# Patient Record
Sex: Female | Born: 1935 | Race: White | Hispanic: No | Smoking: Never smoker
Health system: Southern US, Community
[De-identification: ages and names within clinical notes are randomized; demographics above are authoritative.]

## PROBLEM LIST (undated history)

## (undated) DIAGNOSIS — I350 Nonrheumatic aortic (valve) stenosis: Secondary | ICD-10-CM

## (undated) DIAGNOSIS — I5032 Chronic diastolic (congestive) heart failure: Secondary | ICD-10-CM

## (undated) DIAGNOSIS — M1990 Unspecified osteoarthritis, unspecified site: Secondary | ICD-10-CM

## (undated) DIAGNOSIS — N183 Chronic kidney disease, stage 3 unspecified: Secondary | ICD-10-CM

## (undated) DIAGNOSIS — E1149 Type 2 diabetes mellitus with other diabetic neurological complication: Secondary | ICD-10-CM

## (undated) DIAGNOSIS — K649 Unspecified hemorrhoids: Secondary | ICD-10-CM

## (undated) DIAGNOSIS — E7800 Pure hypercholesterolemia, unspecified: Secondary | ICD-10-CM

## (undated) DIAGNOSIS — G4730 Sleep apnea, unspecified: Secondary | ICD-10-CM

## (undated) DIAGNOSIS — H8309 Labyrinthitis, unspecified ear: Secondary | ICD-10-CM

## (undated) DIAGNOSIS — K8020 Calculus of gallbladder without cholecystitis without obstruction: Secondary | ICD-10-CM

## (undated) DIAGNOSIS — G5603 Carpal tunnel syndrome, bilateral upper limbs: Secondary | ICD-10-CM

## (undated) DIAGNOSIS — I639 Cerebral infarction, unspecified: Secondary | ICD-10-CM

## (undated) DIAGNOSIS — I482 Chronic atrial fibrillation, unspecified: Secondary | ICD-10-CM

## (undated) DIAGNOSIS — I209 Angina pectoris, unspecified: Secondary | ICD-10-CM

## (undated) DIAGNOSIS — J45909 Unspecified asthma, uncomplicated: Secondary | ICD-10-CM

## (undated) DIAGNOSIS — K5792 Diverticulitis of intestine, part unspecified, without perforation or abscess without bleeding: Secondary | ICD-10-CM

## (undated) DIAGNOSIS — I340 Nonrheumatic mitral (valve) insufficiency: Secondary | ICD-10-CM

## (undated) DIAGNOSIS — K559 Vascular disorder of intestine, unspecified: Secondary | ICD-10-CM

## (undated) DIAGNOSIS — Z860101 Personal history of adenomatous and serrated colon polyps: Secondary | ICD-10-CM

## (undated) DIAGNOSIS — Z8739 Personal history of other diseases of the musculoskeletal system and connective tissue: Secondary | ICD-10-CM

## (undated) DIAGNOSIS — I517 Cardiomegaly: Secondary | ICD-10-CM

## (undated) DIAGNOSIS — I10 Essential (primary) hypertension: Secondary | ICD-10-CM

## (undated) DIAGNOSIS — D649 Anemia, unspecified: Secondary | ICD-10-CM

## (undated) DIAGNOSIS — E039 Hypothyroidism, unspecified: Secondary | ICD-10-CM

## (undated) DIAGNOSIS — I739 Peripheral vascular disease, unspecified: Secondary | ICD-10-CM

## (undated) DIAGNOSIS — R51 Headache: Secondary | ICD-10-CM

## (undated) DIAGNOSIS — R600 Localized edema: Secondary | ICD-10-CM

## (undated) DIAGNOSIS — G629 Polyneuropathy, unspecified: Secondary | ICD-10-CM

## (undated) DIAGNOSIS — Z86010 Personal history of colonic polyps: Secondary | ICD-10-CM

## (undated) DIAGNOSIS — N6019 Diffuse cystic mastopathy of unspecified breast: Secondary | ICD-10-CM

## (undated) DIAGNOSIS — K219 Gastro-esophageal reflux disease without esophagitis: Secondary | ICD-10-CM

## (undated) DIAGNOSIS — J189 Pneumonia, unspecified organism: Secondary | ICD-10-CM

## (undated) HISTORY — PX: DILATION AND CURETTAGE OF UTERUS: SHX78

## (undated) HISTORY — DX: Peripheral vascular disease, unspecified: I73.9

## (undated) HISTORY — PX: CATARACT EXTRACTION W/ INTRAOCULAR LENS  IMPLANT, BILATERAL: SHX1307

## (undated) HISTORY — DX: Personal history of colonic polyps: Z86.010

## (undated) HISTORY — DX: Chronic diastolic (congestive) heart failure: I50.32

## (undated) HISTORY — DX: Localized edema: R60.0

## (undated) HISTORY — DX: Nonrheumatic aortic (valve) stenosis: I35.0

## (undated) HISTORY — DX: Labyrinthitis, unspecified ear: H83.09

## (undated) HISTORY — PX: KYPHOPLASTY: SHX5884

## (undated) HISTORY — DX: Diffuse cystic mastopathy of unspecified breast: N60.19

## (undated) HISTORY — DX: Diverticulitis of intestine, part unspecified, without perforation or abscess without bleeding: K57.92

## (undated) HISTORY — DX: Vascular disorder of intestine, unspecified: K55.9

## (undated) HISTORY — DX: Chronic kidney disease, stage 3 unspecified: N18.30

## (undated) HISTORY — DX: Unspecified hemorrhoids: K64.9

## (undated) HISTORY — DX: Personal history of adenomatous and serrated colon polyps: Z86.0101

## (undated) HISTORY — DX: Nonrheumatic mitral (valve) insufficiency: I34.0

## (undated) HISTORY — PX: TOTAL KNEE ARTHROPLASTY: SHX125

## (undated) HISTORY — DX: Calculus of gallbladder without cholecystitis without obstruction: K80.20

## (undated) HISTORY — DX: Chronic atrial fibrillation, unspecified: I48.20

## (undated) HISTORY — DX: Chronic kidney disease, stage 3 (moderate): N18.3

## (undated) HISTORY — DX: Type 2 diabetes mellitus with other diabetic neurological complication: E11.49

## (undated) HISTORY — DX: Unspecified osteoarthritis, unspecified site: M19.90

## (undated) HISTORY — DX: Anemia, unspecified: D64.9

## (undated) HISTORY — DX: Gastro-esophageal reflux disease without esophagitis: K21.9

## (undated) HISTORY — DX: Carpal tunnel syndrome, bilateral upper limbs: G56.03

---

## 1959-07-01 HISTORY — PX: TONSILLECTOMY AND ADENOIDECTOMY: SUR1326

## 1969-06-30 HISTORY — PX: VAGINAL HYSTERECTOMY: SUR661

## 2000-02-23 ENCOUNTER — Encounter: Payer: Self-pay | Admitting: Gastroenterology

## 2000-02-23 ENCOUNTER — Encounter: Admission: RE | Admit: 2000-02-23 | Discharge: 2000-02-23 | Payer: Self-pay | Admitting: Gastroenterology

## 2001-02-25 ENCOUNTER — Encounter: Admission: RE | Admit: 2001-02-25 | Discharge: 2001-02-25 | Payer: Self-pay | Admitting: Gastroenterology

## 2001-02-25 ENCOUNTER — Encounter: Payer: Self-pay | Admitting: Gastroenterology

## 2001-04-16 ENCOUNTER — Ambulatory Visit: Admission: RE | Admit: 2001-04-16 | Discharge: 2001-04-16 | Payer: Self-pay | Admitting: Orthopedic Surgery

## 2001-05-09 ENCOUNTER — Encounter: Admission: RE | Admit: 2001-05-09 | Discharge: 2001-08-07 | Payer: Self-pay | Admitting: Gastroenterology

## 2001-06-10 ENCOUNTER — Encounter: Payer: Self-pay | Admitting: Orthopedic Surgery

## 2001-06-12 ENCOUNTER — Inpatient Hospital Stay (HOSPITAL_COMMUNITY): Admission: RE | Admit: 2001-06-12 | Discharge: 2001-06-17 | Payer: Self-pay | Admitting: Orthopedic Surgery

## 2001-06-17 ENCOUNTER — Inpatient Hospital Stay (HOSPITAL_COMMUNITY)
Admission: RE | Admit: 2001-06-17 | Discharge: 2001-07-01 | Payer: Self-pay | Admitting: Physical Medicine & Rehabilitation

## 2001-06-23 ENCOUNTER — Encounter: Payer: Self-pay | Admitting: Physical Medicine & Rehabilitation

## 2001-06-24 ENCOUNTER — Encounter: Payer: Self-pay | Admitting: Physical Medicine & Rehabilitation

## 2001-10-15 ENCOUNTER — Ambulatory Visit (HOSPITAL_BASED_OUTPATIENT_CLINIC_OR_DEPARTMENT_OTHER): Admission: RE | Admit: 2001-10-15 | Discharge: 2001-10-15 | Payer: Self-pay | Admitting: General Surgery

## 2001-11-05 ENCOUNTER — Encounter: Admission: RE | Admit: 2001-11-05 | Discharge: 2001-11-05 | Payer: Self-pay | Admitting: Gastroenterology

## 2001-11-05 ENCOUNTER — Encounter: Payer: Self-pay | Admitting: Gastroenterology

## 2002-01-21 ENCOUNTER — Encounter: Admission: RE | Admit: 2002-01-21 | Discharge: 2002-04-21 | Payer: Self-pay | Admitting: Gastroenterology

## 2002-03-21 ENCOUNTER — Encounter: Payer: Self-pay | Admitting: Gastroenterology

## 2002-03-21 ENCOUNTER — Encounter: Admission: RE | Admit: 2002-03-21 | Discharge: 2002-03-21 | Payer: Self-pay | Admitting: Gastroenterology

## 2002-07-15 ENCOUNTER — Encounter: Admission: RE | Admit: 2002-07-15 | Discharge: 2002-10-13 | Payer: Self-pay | Admitting: Gastroenterology

## 2003-02-24 ENCOUNTER — Encounter: Admission: RE | Admit: 2003-02-24 | Discharge: 2003-02-24 | Payer: Self-pay | Admitting: Gastroenterology

## 2003-02-24 ENCOUNTER — Encounter: Payer: Self-pay | Admitting: Gastroenterology

## 2003-03-25 ENCOUNTER — Encounter: Admission: RE | Admit: 2003-03-25 | Discharge: 2003-03-25 | Payer: Self-pay | Admitting: Gastroenterology

## 2003-03-25 ENCOUNTER — Encounter: Payer: Self-pay | Admitting: Gastroenterology

## 2004-02-25 ENCOUNTER — Encounter: Admission: RE | Admit: 2004-02-25 | Discharge: 2004-02-25 | Payer: Self-pay | Admitting: Gastroenterology

## 2005-02-27 ENCOUNTER — Encounter: Admission: RE | Admit: 2005-02-27 | Discharge: 2005-02-27 | Payer: Self-pay | Admitting: Gastroenterology

## 2005-05-26 ENCOUNTER — Inpatient Hospital Stay (HOSPITAL_COMMUNITY): Admission: EM | Admit: 2005-05-26 | Discharge: 2005-05-27 | Payer: Self-pay | Admitting: Emergency Medicine

## 2005-06-17 ENCOUNTER — Encounter: Admission: RE | Admit: 2005-06-17 | Discharge: 2005-06-17 | Payer: Self-pay | Admitting: Gastroenterology

## 2005-06-28 ENCOUNTER — Ambulatory Visit (HOSPITAL_COMMUNITY): Admission: RE | Admit: 2005-06-28 | Discharge: 2005-06-28 | Payer: Self-pay | Admitting: Cardiology

## 2005-06-28 ENCOUNTER — Encounter (INDEPENDENT_AMBULATORY_CARE_PROVIDER_SITE_OTHER): Payer: Self-pay | Admitting: Cardiology

## 2005-10-06 ENCOUNTER — Encounter: Admission: RE | Admit: 2005-10-06 | Discharge: 2005-10-06 | Payer: Self-pay | Admitting: Neurology

## 2005-10-13 ENCOUNTER — Ambulatory Visit: Payer: Self-pay

## 2006-03-07 ENCOUNTER — Ambulatory Visit (HOSPITAL_COMMUNITY): Admission: RE | Admit: 2006-03-07 | Discharge: 2006-03-07 | Payer: Self-pay | Admitting: Neurology

## 2006-03-07 ENCOUNTER — Encounter (INDEPENDENT_AMBULATORY_CARE_PROVIDER_SITE_OTHER): Payer: Self-pay | Admitting: Neurology

## 2006-03-23 ENCOUNTER — Encounter: Admission: RE | Admit: 2006-03-23 | Discharge: 2006-03-23 | Payer: Self-pay | Admitting: Gastroenterology

## 2007-03-29 ENCOUNTER — Encounter: Admission: RE | Admit: 2007-03-29 | Discharge: 2007-03-29 | Payer: Self-pay | Admitting: Gastroenterology

## 2007-07-10 ENCOUNTER — Ambulatory Visit (HOSPITAL_COMMUNITY): Admission: RE | Admit: 2007-07-10 | Discharge: 2007-07-10 | Payer: Self-pay | Admitting: Cardiology

## 2007-07-10 ENCOUNTER — Encounter (INDEPENDENT_AMBULATORY_CARE_PROVIDER_SITE_OTHER): Payer: Self-pay | Admitting: Cardiology

## 2007-07-10 ENCOUNTER — Ambulatory Visit: Payer: Self-pay | Admitting: Vascular Surgery

## 2008-01-15 ENCOUNTER — Encounter: Admission: RE | Admit: 2008-01-15 | Discharge: 2008-01-15 | Payer: Self-pay | Admitting: Gastroenterology

## 2008-01-22 ENCOUNTER — Encounter: Admission: RE | Admit: 2008-01-22 | Discharge: 2008-01-22 | Payer: Self-pay | Admitting: Gastroenterology

## 2008-03-30 ENCOUNTER — Encounter: Admission: RE | Admit: 2008-03-30 | Discharge: 2008-03-30 | Payer: Self-pay | Admitting: Gastroenterology

## 2008-09-23 ENCOUNTER — Ambulatory Visit: Payer: Self-pay | Admitting: Vascular Surgery

## 2008-10-06 ENCOUNTER — Ambulatory Visit: Payer: Self-pay | Admitting: Vascular Surgery

## 2009-04-02 ENCOUNTER — Encounter: Admission: RE | Admit: 2009-04-02 | Discharge: 2009-04-02 | Payer: Self-pay | Admitting: Gastroenterology

## 2009-10-01 ENCOUNTER — Ambulatory Visit (HOSPITAL_COMMUNITY): Admission: RE | Admit: 2009-10-01 | Discharge: 2009-10-01 | Payer: Self-pay | Admitting: Gastroenterology

## 2010-04-15 ENCOUNTER — Encounter: Admission: RE | Admit: 2010-04-15 | Discharge: 2010-04-15 | Payer: Self-pay | Admitting: Geriatric Medicine

## 2010-09-04 ENCOUNTER — Inpatient Hospital Stay (HOSPITAL_COMMUNITY)
Admission: EM | Admit: 2010-09-04 | Discharge: 2010-09-08 | Payer: Self-pay | Source: Home / Self Care | Admitting: Emergency Medicine

## 2010-09-05 ENCOUNTER — Encounter (INDEPENDENT_AMBULATORY_CARE_PROVIDER_SITE_OTHER): Payer: Self-pay | Admitting: Internal Medicine

## 2010-09-06 ENCOUNTER — Ambulatory Visit: Payer: Self-pay | Admitting: Physical Medicine & Rehabilitation

## 2010-10-27 ENCOUNTER — Ambulatory Visit (HOSPITAL_COMMUNITY)
Admission: RE | Admit: 2010-10-27 | Discharge: 2010-10-27 | Payer: Self-pay | Source: Home / Self Care | Attending: Cardiology | Admitting: Cardiology

## 2010-11-30 DIAGNOSIS — J189 Pneumonia, unspecified organism: Secondary | ICD-10-CM

## 2010-11-30 HISTORY — DX: Pneumonia, unspecified organism: J18.9

## 2010-12-10 ENCOUNTER — Inpatient Hospital Stay (HOSPITAL_COMMUNITY)
Admission: EM | Admit: 2010-12-10 | Discharge: 2010-12-14 | DRG: 193 | Disposition: A | Discharge status: Home Health | Payer: Medicare Other | Attending: Internal Medicine | Admitting: Internal Medicine

## 2010-12-10 ENCOUNTER — Emergency Department (HOSPITAL_COMMUNITY): Payer: Medicare Other

## 2010-12-10 DIAGNOSIS — D649 Anemia, unspecified: Secondary | ICD-10-CM | POA: Diagnosis present

## 2010-12-10 DIAGNOSIS — I509 Heart failure, unspecified: Secondary | ICD-10-CM | POA: Diagnosis present

## 2010-12-10 DIAGNOSIS — Z8673 Personal history of transient ischemic attack (TIA), and cerebral infarction without residual deficits: Secondary | ICD-10-CM

## 2010-12-10 DIAGNOSIS — G609 Hereditary and idiopathic neuropathy, unspecified: Secondary | ICD-10-CM | POA: Diagnosis present

## 2010-12-10 DIAGNOSIS — IMO0001 Reserved for inherently not codable concepts without codable children: Secondary | ICD-10-CM | POA: Diagnosis present

## 2010-12-10 DIAGNOSIS — E785 Hyperlipidemia, unspecified: Secondary | ICD-10-CM | POA: Diagnosis present

## 2010-12-10 DIAGNOSIS — I5033 Acute on chronic diastolic (congestive) heart failure: Secondary | ICD-10-CM | POA: Diagnosis present

## 2010-12-10 DIAGNOSIS — Z7982 Long term (current) use of aspirin: Secondary | ICD-10-CM

## 2010-12-10 DIAGNOSIS — I4891 Unspecified atrial fibrillation: Secondary | ICD-10-CM | POA: Diagnosis present

## 2010-12-10 DIAGNOSIS — J189 Pneumonia, unspecified organism: Principal | ICD-10-CM | POA: Diagnosis present

## 2010-12-10 DIAGNOSIS — E039 Hypothyroidism, unspecified: Secondary | ICD-10-CM | POA: Diagnosis present

## 2010-12-10 LAB — BASIC METABOLIC PANEL
BUN: 28 mg/dL — ABNORMAL HIGH (ref 6–23)
CO2: 29 mEq/L (ref 19–32)
Chloride: 89 mEq/L — ABNORMAL LOW (ref 96–112)
Creatinine, Ser: 1.4 mg/dL — ABNORMAL HIGH (ref 0.4–1.2)
GFR calc Af Amer: 43 mL/min — ABNORMAL LOW (ref >60)
GFR calc non Af Amer: 36 mL/min — ABNORMAL LOW (ref >60)
Glucose, Bld: 254 mg/dL — ABNORMAL HIGH (ref 70–99)
Potassium: 3.2 mEq/L — ABNORMAL LOW (ref 3.5–5.1)
Sodium: 130 mEq/L — ABNORMAL LOW (ref 135–145)

## 2010-12-10 LAB — CBC
HCT: 33.6 % — ABNORMAL LOW (ref 36.0–46.0)
Hemoglobin: 10.5 g/dL — ABNORMAL LOW (ref 12.0–15.0)
MCH: 27.3 pg (ref 26.0–34.0)
MCHC: 31.3 g/dL (ref 30.0–36.0)
MCV: 87.5 fL (ref 78.0–100.0)
RBC: 3.84 MIL/uL — ABNORMAL LOW (ref 3.87–5.11)
RDW: 19.8 % — ABNORMAL HIGH (ref 11.5–15.5)

## 2010-12-10 LAB — POCT CARDIAC MARKERS
CKMB, poc: <1 ng/mL — ABNORMAL LOW (ref 1.0–8.0)
Myoglobin, poc: 130 ng/mL (ref 12–200)

## 2010-12-10 LAB — DIFFERENTIAL
Basophils Relative: 0 % (ref 0–1)
Eosinophils Absolute: 0 10*3/uL (ref 0.0–0.7)
Lymphocytes Relative: 11 % — ABNORMAL LOW (ref 12–46)
Lymphs Abs: 1.7 10*3/uL (ref 0.7–4.0)
Monocytes Absolute: 1.4 10*3/uL — ABNORMAL HIGH (ref 0.1–1.0)
Monocytes Relative: 9 % (ref 3–12)
Neutro Abs: 12.3 10*3/uL — ABNORMAL HIGH (ref 1.7–7.7)
Neutrophils Relative %: 80 % — ABNORMAL HIGH (ref 43–77)

## 2010-12-10 LAB — CK TOTAL AND CKMB (NOT AT ARMC): CK, MB: 0.7 ng/mL (ref 0.3–4.0)

## 2010-12-10 LAB — TROPONIN I: Troponin I: 0.04 ng/mL (ref 0.00–0.06)

## 2010-12-10 LAB — BRAIN NATRIURETIC PEPTIDE: Pro B Natriuretic peptide (BNP): 926 pg/mL — ABNORMAL HIGH (ref 0.0–100.0)

## 2010-12-11 ENCOUNTER — Inpatient Hospital Stay (HOSPITAL_COMMUNITY): Payer: Medicare Other

## 2010-12-11 DIAGNOSIS — R0602 Shortness of breath: Secondary | ICD-10-CM

## 2010-12-11 LAB — GLUCOSE, CAPILLARY
Glucose-Capillary: 85 mg/dL (ref 70–99)
Glucose-Capillary: 191 mg/dL — ABNORMAL HIGH (ref 70–99)
Glucose-Capillary: 173 mg/dL — ABNORMAL HIGH (ref 70–99)
Glucose-Capillary: 192 mg/dL — ABNORMAL HIGH (ref 70–99)

## 2010-12-11 LAB — COMPREHENSIVE METABOLIC PANEL
AST: 28 U/L (ref 0–37)
BUN: 27 mg/dL — ABNORMAL HIGH (ref 6–23)
CO2: 30 mEq/L (ref 19–32)
Calcium: 8.9 mg/dL (ref 8.4–10.5)
Chloride: 98 mEq/L (ref 96–112)
Creatinine, Ser: 1.27 mg/dL — ABNORMAL HIGH (ref 0.4–1.2)
GFR calc Af Amer: 49 mL/min — ABNORMAL LOW (ref >60)
GFR calc non Af Amer: 40 mL/min — ABNORMAL LOW (ref >60)
Glucose, Bld: 83 mg/dL (ref 70–99)
Total Bilirubin: 1 mg/dL (ref 0.3–1.2)

## 2010-12-11 LAB — BRAIN NATRIURETIC PEPTIDE: Pro B Natriuretic peptide (BNP): 797 pg/mL — ABNORMAL HIGH (ref 0.0–100.0)

## 2010-12-11 LAB — CARDIAC PANEL(CRET KIN+CKTOT+MB+TROPI)
CK, MB: 0.8 ng/mL (ref 0.3–4.0)
Total CK: 33 U/L (ref 7–177)
Troponin I: 0.03 ng/mL (ref 0.00–0.06)

## 2010-12-11 LAB — CBC
Hemoglobin: 9.7 g/dL — ABNORMAL LOW (ref 12.0–15.0)
Platelets: 205 10*3/uL (ref 150–400)
RBC: 3.56 MIL/uL — ABNORMAL LOW (ref 3.87–5.11)
WBC: 13.6 10*3/uL — ABNORMAL HIGH (ref 4.0–10.5)

## 2010-12-11 LAB — TSH: TSH: 0.888 u[IU]/mL (ref 0.350–4.500)

## 2010-12-11 LAB — LIPID PANEL
Cholesterol: 94 mg/dL (ref 0–200)
HDL: 41 mg/dL (ref >39)
LDL Cholesterol: 44 mg/dL (ref 0–99)
Triglycerides: 47 mg/dL (ref <150)

## 2010-12-11 LAB — FERRITIN: Ferritin: 42 ng/mL (ref 10–291)

## 2010-12-11 LAB — IRON AND TIBC: TIBC: 384 ug/dL (ref 250–470)

## 2010-12-11 LAB — VITAMIN B12: Vitamin B-12: >2000 pg/mL — ABNORMAL HIGH (ref 211–911)

## 2010-12-11 MED ORDER — TECHNETIUM TO 99M ALBUMIN AGGREGATED
6.0000 | Freq: Once | INTRAVENOUS | Status: AC | PRN
  Administered 2010-12-11: 6 via INTRAVENOUS

## 2010-12-11 MED ORDER — XENON XE 133 GAS
10.0000 | GAS_FOR_INHALATION | Freq: Once | RESPIRATORY_TRACT | Status: AC | PRN
  Administered 2010-12-11: 10 via RESPIRATORY_TRACT

## 2010-12-12 ENCOUNTER — Inpatient Hospital Stay (HOSPITAL_COMMUNITY): Payer: Medicare Other

## 2010-12-12 LAB — BASIC METABOLIC PANEL
BUN: 27 mg/dL — ABNORMAL HIGH (ref 6–23)
CO2: 33 mEq/L — ABNORMAL HIGH (ref 19–32)
GFR calc non Af Amer: 38 mL/min — ABNORMAL LOW (ref >60)
Glucose, Bld: 144 mg/dL — ABNORMAL HIGH (ref 70–99)
Potassium: 4.7 mEq/L (ref 3.5–5.1)

## 2010-12-12 LAB — GLUCOSE, CAPILLARY
Glucose-Capillary: 198 mg/dL — ABNORMAL HIGH (ref 70–99)
Glucose-Capillary: 120 mg/dL — ABNORMAL HIGH (ref 70–99)

## 2010-12-12 LAB — HEMOGLOBIN A1C
Hgb A1c MFr Bld: 6.3 % — ABNORMAL HIGH (ref <5.7)
Mean Plasma Glucose: 134 mg/dL — ABNORMAL HIGH (ref <117)

## 2010-12-13 ENCOUNTER — Inpatient Hospital Stay (HOSPITAL_COMMUNITY): Payer: Medicare Other

## 2010-12-13 LAB — BASIC METABOLIC PANEL
BUN: 24 mg/dL — ABNORMAL HIGH (ref 6–23)
Chloride: 97 mEq/L (ref 96–112)
GFR calc non Af Amer: 40 mL/min — ABNORMAL LOW (ref >60)
Glucose, Bld: 106 mg/dL — ABNORMAL HIGH (ref 70–99)
Potassium: 3.6 mEq/L (ref 3.5–5.1)
Sodium: 140 mEq/L (ref 135–145)

## 2010-12-13 LAB — CBC
HCT: 34.1 % — ABNORMAL LOW (ref 36.0–46.0)
Hemoglobin: 10.5 g/dL — ABNORMAL LOW (ref 12.0–15.0)
MCV: 88.3 fL (ref 78.0–100.0)
RBC: 3.86 MIL/uL — ABNORMAL LOW (ref 3.87–5.11)
WBC: 9.4 10*3/uL (ref 4.0–10.5)

## 2010-12-13 LAB — URINALYSIS, MICROSCOPIC ONLY
Bilirubin Urine: NEGATIVE
Hgb urine dipstick: NEGATIVE
Ketones, ur: NEGATIVE mg/dL
Nitrite: NEGATIVE
Protein, ur: NEGATIVE mg/dL
Specific Gravity, Urine: 1.012 (ref 1.005–1.030)
Urobilinogen, UA: 0.2 mg/dL (ref 0.0–1.0)

## 2010-12-13 LAB — GLUCOSE, CAPILLARY
Glucose-Capillary: 172 mg/dL — ABNORMAL HIGH (ref 70–99)
Glucose-Capillary: 147 mg/dL — ABNORMAL HIGH (ref 70–99)

## 2010-12-14 LAB — BASIC METABOLIC PANEL
CO2: 30 mEq/L (ref 19–32)
Calcium: 8.8 mg/dL (ref 8.4–10.5)
Creatinine, Ser: 1.31 mg/dL — ABNORMAL HIGH (ref 0.4–1.2)
GFR calc Af Amer: 47 mL/min — ABNORMAL LOW (ref >60)
GFR calc non Af Amer: 39 mL/min — ABNORMAL LOW (ref >60)
Glucose, Bld: 110 mg/dL — ABNORMAL HIGH (ref 70–99)
Sodium: 136 mEq/L (ref 135–145)

## 2010-12-14 LAB — GLUCOSE, CAPILLARY: Glucose-Capillary: 124 mg/dL — ABNORMAL HIGH (ref 70–99)

## 2010-12-19 NOTE — Discharge Summary (Signed)
[name], [name]             ACCOUNT NO.:  [id number]  MEDICAL RECORD NO.:  [id number]           PATIENT TYPE:  LOCATION:                                 FACILITY:  PHYSICIAN:  Jeoffrey Massed, MD    DATE OF BIRTH:  [birth year]  DATE OF ADMISSION: DATE OF DISCHARGE:                        DISCHARGE SUMMARY - REFERRING   PRIMARY CARE PRACTITIONER:  Hal T. Stoneking, MD  PRIMARY CARDIOLOGIST:  Armanda Magic, MD of [facility]  PRIMARY DISCHARGE DIAGNOSES:  Shortness of breath, multifactorial likely a combination of pneumonia with diastolic congestive heart failure, decompensation.  SECONDARY DISCHARGE DIAGNOSES: 1. Diastolic congestive heart failure. 2. Atrial fibrillation, rate controlled on Pradaxa. 3. Recent cerebrovascular accident. 4. Dyslipidemia. 5. Type 2 diabetes. 6. Peripheral neuropathy. 7. Hypothyroidism. 8. Anemia.  DISCHARGE MEDICATIONS: 1. Avelox 400 mg 1 tablet p.o. daily for 2 more days. 2. Allopurinol 300 mg 1 tablet p.o. every morning. 3. Aspirin 81 mg 1 tablet p.o. daily. 4. Calcium with vitamin D over-the-counter 600/200 mg 1 tablet p.o.     twice daily. 5. Cardizem 180 mg 1 tablet every morning. 6. Fish oil 1000 mg 1 capsule p.o. every morning. 7. Lasix 40 mg 1 tablet p.o. twice daily. 8. Garlique 400 mg 1 tablet p.o. every morning. 9. Glipizide 5 mg 1 tablet p.o. every morning. 10.Levothyroxine 88 mcg 1 tablet p.o. every morning. 11.Lipitor 10 mg 1 tablet p.o. at bedtime. 12.Metanx 5.8 mg 1 tablet p.o. twice daily. 13.Multivitamins 1 tablet p.o. every morning. 14.Neurontin 300 mg 1 tablet p.o. at bedtime. 15.Potassium chloride 20 mEq 1 tablet p.o. daily. 16.Pradaxa 75 mg 1 tablet p.o. twice daily. 17.Prilosec 20 mg 1 capsule every morning. 18.Sonata 5 mg 1 capsule p.o. every at bedtime. 19.Vitamin C over-the-counter 500 mg 1 tablet p.o. every morning.  CONSULTATIONS:  None.  HISTORY OF PRESENT ILLNESS:  The patient is a very  pleasant 77 year old female who presented to the hospital on December 10, 2010 with shortness of breath and weakness.  Per the history and physical that was done on admission, the patient was having weakness over the past 3-4 days and on the day of admission became very short of breath.  She was profoundly hypoxic with initial O2 saturation of around 80% when EMS evaluated her. She was then evaluated in the emergency room in [facility] and a chest x-ray was done, which showed infiltrates and congestion.  The patient was then dose with Lasix and Avelox and admitted to the hospitalist service for further evaluation and treatment.  For further details, please see the history and physical that was dictated by Dr. Toniann Fail on admission.  PERTINENT LABORATORY DATA:  BNP on discharge has decreased to 414. Chemistries on discharge shows a sodium of 136, potassium of 3.8, chloride of 96, bicarb of 30, glucose of 110, BUN of 23, creatinine of 1.31 and a calcium of 8.8.  BNP on admission was 926.  Cardiac enzymes were negative.  HbA1c of 6.3.  TSH is 2.933.  RADIOLOGICAL STUDIES: 1. VQ scan done on December 11, 2010 shows low likelihood ratio for     pulmonary embolism. 2. Chest x-ray done  on admission shows questionable right base     infectious infiltrate with pulmonary vascular congestion. 3. Last chest x-ray done on the December 13, 2010 shows stable     cardiomegaly, pulmonary vascular congestion and probable mild     interstitial pulmonary edema.  Stable bibasilar atelectasis or     pneumonia.  Stable bilateral pleural effusions.  BRIEF HOSPITAL COURSE: 1. Shortness of breath.  This was likely multifactorial probably a     combination of diastolic CHF decompensation and a possible     pneumonia as well.  As noted above in the HPI, the patient was     admitted to the hospitalist service given intravenous furosemide     and Avelox.  With this, she has become much more better  clinically.     Admitting weight was 83.2 kg and her discharge weight is 80.9.  She     has diuresed well.  She is currently significantly better with     having clear lungs on examination.  She continues to have around 1+     pitting edema.  The current plans are to continue her p.o. Lasix     and transition her to p.o. Avelox for another 2 more days.  She was     ambulated by the nursing staff today and upon walking around 350     feet, her O2 saturations did decrease to 87%.  However, the patient     does not look dyspneic or in any acute distress at this point in     time during my evaluation.  She is easily able to talk in full     sentences and is not using any of accessory muscles of respiration.     As noted above, her lungs were clear on examination.  Current plans     are for the patient to use home O2 and hopefully with continued     diuresis and once she continues the antibiotic course, she will do     much better.  She will need to follow up with the primary care     practitioner within 1 week's time to do reassessment to see if she     still requires O2.  She has been counseled extensively by me in     regards to her diet and in the importance of restriction of fluids     as well given a history of congestive heart failure.  She claims     understanding. 2. Atrial fibrillation, this is rate controlled.  The patient is on     Cardizem and Pradaxa.  She already has a previously scheduled     appointment with her primary cardiologist next week and apparently     she also is planning to do a cardioversion in early March.  We will     leave further workup in regards to this to her primary care doctors     and her primary cardiologist. 3. Anemia.  Her last hemoglobin is 10.5 and this is almost the same     level as it was when she was here in November.  Anemia panel does     show an iron level of 25.  The patient claims that she has had 2     colonoscopies done in the past with  the last one being only 2 years     ago and all of them were negative.  The patient denies passing any     black  or melanotic stools.  The patient is in Pradaxa and given her     hemoglobin stability, we have deferred any further workup as an     inpatient.  She will need further workup as an outpatient and we     will leave this to the discretion of her primary doctors. 4. Diabetes.  She is to resume her usual medications. 5. Levothyroxine.  She is to resume levothyroxine.  DISPOSITION:  The patient will be discharged home with home health services.  FOLLOWUP INSTRUCTIONS: 1. The patient to follow up with Dr. Mayford Knife within 1 week upon     discharge.  She apparently has a previously scheduled appointment     next week. 2. The patient to follow up with Dr. Merlene Laughter within 1 week upon     discharge.  She is to call and make an appointment. 3. At follow up with the primary physicians, her home O2 needs will     need to be reassessed.  DIET:  Total time spent equals 45 minutes.     Jeoffrey Massed, MD     SG/MEDQ  D:  12/14/2010  T:  12/14/2010  Job:  [id number]  cc:   Hal T. Stoneking, M.D. Armanda Magic, M.D.  Electronically Signed by Jeoffrey Massed  on 12/19/2010 04:27:12 PM

## 2010-12-26 NOTE — H&P (Signed)
[name], [name]             ACCOUNT NO.:  [id number]  MEDICAL RECORD NO.:  [id number]           PATIENT TYPE:  E  LOCATION:  [facility]                         FACILITY:  [facility]  PHYSICIAN:  Eduard Clos, MDDATE OF BIRTH:  07-Aug-1924  DATE OF ADMISSION:  12/10/2010 DATE OF DISCHARGE:                             HISTORY & PHYSICAL   PRIMARY CARE PHYSICIAN:  Hal T. Stoneking, MD  CHIEF COMPLAINT:  Shortness of breath and weakness.  HISTORY OF PRESENT ILLNESS:  An 77 year old female with known history of acute embolic stroke in November 2011, history of diastolic CHF with last EF measured in November 2011 with 60-65%, hypertension, diabetes mellitus type 2, paroxysmal atrial fibrillation, dyslipidemia, has been experienced some weakness over last 3-4 days, she finds it difficult to walk and legs give way.  She did not have any focal deficit.  Today, she became very short of breath.  Her daughter who went to see her at home found that short of breath and hypoxic, called EMS, the EMS found initially her oxygen saturation was an 80%.  She was brought to the ER. In the ER, the patient has an x-ray, which shows infiltrates and congestion.  The patient has been started on Avelox and dose of Lasix has been given.  At this time, the patient is admitted for shortness of breath most likely from pneumonia and CHF.  The patient denies any chest pain.  Denies any loss of consciousness. Denies any palpitation.  Denies any dizziness, focal deficit.  Denies any difficulty swallowing.  Denies any visual symptoms.  The patient says that she does have an appointment with ophthalmologist for some retinal problems next month.  Denies any abdominal pain, dysuria, discharges, diarrhea, or nausea, vomiting.  PAST MEDICAL HISTORY: 1. Acute embolic CVA in November 2011. 2. History of CHF with EF measured in November 2011 was 60-55%. 3. Hypertension. 4. Paroxysmal atrial fibrillation, on  Pradaxa. 5. Type 2 diabetes mellitus. 6. Dyslipidemia.  PAST SURGICAL HISTORY:  Tonsillectomy, hysterectomy, and knee replacement.  MEDICATIONS PRIOR TO ADMISSION: 1. The patient is on Pradaxa 75 mg p.o. b.i.d.  She is off the     Premarin since the January first week. 2. Vitamin C. 3. Fish oil. 4. Calcium. 5. Garlique. 6. Metanx. 7. Levothyroxine 88 mcg p.o. daily. 8. Lasix 40 mg twice daily. 9. Potassium chloride 20 mg daily. 10.Glucotrol XL 5 mg daily. 11.Cardizem CD 180 mg p.o. daily. 12.Aspirin 325 p.o. daily. 13.Allopurinol 300 mg daily. 14.Lipitor 10 mg daily. 15.Prilosec 20 mg daily.  ALLERGIES:  PENICILLIN.  SOCIAL HISTORY:  The patient lives alone, is frequented by her family often.  She is a full code.  Denies smoking cigarettes, drinking alcohol or using illegal drugs.  REVIEW OF SYSTEMS:  As per history of present illness, nothing else significant.  PHYSICAL EXAMINATION:  GENERAL:  The patient examined at the bedside, not in acute distress. VITAL SIGNS:  Blood pressure 108/60, pulse of 86 per minute, temperature 98.2, respirations 20 per minute, O2 sat 96%. HEENT:  Anicteric.  No pallor.  No discharge from ears, eyes or mouth. CHEST:  Bilateral air entry present.  No rhonchi and mild basal crepitations heard. HEART:  S1 and S2 heard. ABDOMEN:  Soft, nontender.  Bowel sounds heard. CNS: The patient is alert, awake, and oriented to time, place and person.  At this time, the patient is able to move upper and lower extremities 5/5.  Her previous toe, she did have some right-sided hemiparesis.  At this time, the patient states that she has completely regained her strength and at this time on exam, also the patient has 5/5. EXTREMITIES:  There is 1 to 2+ edema on the left lower extremity, which has been chronic.  There is no acute ischemic changes.  There mild edema on the right lower extremity.  No cyanosis or clubbing.  Pulses felt.  LABORATORY DATA:  EKG  shows atrial fibrillation with right bundle-branch block, heart rate is around 84 beats per minute with nonspecific ST-T changes and Q-waves in inferior leads.  Chest x-ray shows questionable right base infectious infiltrates, pulmonary vascular congestion and cardiomegaly, tortuous aorta.  CBC; WBC 15.4, hemoglobin 10.5 this is the same level in December 2011, hematocrit is 33.6, platelets is 221, neutrophils 80%.  D-dimer is 0.6.  Basic metabolic panel; sodium 130, potassium 3.2, chloride 89, carbon dioxide 29, glucose 254, BUN 28, creatinine 1.4, calcium 8.9, anion gap is 12, CK-MB less than 1, troponin-I less than 0.05.  BNP is 926.  ASSESSMENT: 1. Shortness of breath, probably multifactorial.     a.     Pneumonia.     b.     Congestive heart failure diastolic, last ejection fraction      measured November 2011 was 60-65%. 2. Anemia, normocytic and normochromic. 3. Recent cerebrovascular accident, embolic presently has no focal     deficit. 4. Atrial fibrillation, on Pradaxa.  At this time, rate controlled. 5. Hyperlipidemia. 6. Diabetes mellitus type 2, uncontrolled.  PLAN: 1. At this time, I will admit the patient to telemetry. 2. For her shortness of breath, which could be multifactorial.  The     patient has already got Lasix 40, I am going to continue two more     doses after which we have to decide if we can change it to p.o. or     we will need more further doses IV.  The patient is also on Avelox     for her pneumonia, which has been treated as community acquired. 3. Atrial fibrillation.  At this time, rate is controlled.  We will     continue her Pradaxa and Cardizem. 4. History of cerebrovascular accident as stated earlier.  The patient     was on Pradaxa, which we are going to continue along with the     aspirin. 5. Anemia.  We are going to check stool for occult blood, check anemia     profile. 6. Diabetes mellitus type 2, uncontrolled.  The patient will be on  CBG     checks.  I am going to check a hemoglobin A1c, hold off the     Glucotrol for now.  The patient if needed may need to be started on     long-acting insulin while the patient is in the hospital and if her     hemoglobin A1c is high, we may have to adjust her medication before     her discharge. 7. Further recommendation as condition evolves. 8. The patient is a full code.     Eduard Clos, MD     ANK/MEDQ  D:  12/10/2010  T:  12/10/2010  Job:  [id number]  cc:   Hal T. Stoneking, M.D.  Electronically Signed by Midge Minium MD on 12/26/2010 04:54:46 PM

## 2011-01-09 LAB — CBC
Hemoglobin: 10.8 g/dL — ABNORMAL LOW (ref 12.0–15.0)
Platelets: 252 10*3/uL (ref 150–400)
RBC: 3.67 MIL/uL — ABNORMAL LOW (ref 3.87–5.11)
WBC: 8.8 10*3/uL (ref 4.0–10.5)

## 2011-01-09 LAB — GLUCOSE, CAPILLARY: Glucose-Capillary: 117 mg/dL — ABNORMAL HIGH (ref 70–99)

## 2011-01-09 LAB — BASIC METABOLIC PANEL
Calcium: 9.2 mg/dL (ref 8.4–10.5)
Creatinine, Ser: 1.3 mg/dL — ABNORMAL HIGH (ref 0.4–1.2)
GFR calc Af Amer: 47 mL/min — ABNORMAL LOW (ref >60)
Sodium: 138 mEq/L (ref 135–145)

## 2011-01-09 LAB — PROTIME-INR
INR: 1.23 (ref 0.00–1.49)
Prothrombin Time: 15.7 seconds — ABNORMAL HIGH (ref 11.6–15.2)

## 2011-01-09 LAB — MAGNESIUM: Magnesium: 1.9 mg/dL (ref 1.5–2.5)

## 2011-01-09 LAB — APTT: aPTT: 34 seconds (ref 24–37)

## 2011-01-10 LAB — COMPREHENSIVE METABOLIC PANEL
ALT: 30 U/L (ref 0–35)
AST: 38 U/L — ABNORMAL HIGH (ref 0–37)
Alkaline Phosphatase: 79 U/L (ref 39–117)
CO2: 29 mEq/L (ref 19–32)
Chloride: 100 mEq/L (ref 96–112)
GFR calc Af Amer: 56 mL/min — ABNORMAL LOW (ref >60)
GFR calc non Af Amer: 46 mL/min — ABNORMAL LOW (ref >60)
Potassium: 3.8 mEq/L (ref 3.5–5.1)
Sodium: 137 mEq/L (ref 135–145)
Total Bilirubin: 0.7 mg/dL (ref 0.3–1.2)

## 2011-01-10 LAB — GLUCOSE, CAPILLARY
Glucose-Capillary: 122 mg/dL — ABNORMAL HIGH (ref 70–99)
Glucose-Capillary: 192 mg/dL — ABNORMAL HIGH (ref 70–99)
Glucose-Capillary: 261 mg/dL — ABNORMAL HIGH (ref 70–99)
Glucose-Capillary: 93 mg/dL (ref 70–99)
Glucose-Capillary: 96 mg/dL (ref 70–99)

## 2011-01-10 LAB — URINALYSIS, ROUTINE W REFLEX MICROSCOPIC
Bilirubin Urine: NEGATIVE
Nitrite: NEGATIVE
Specific Gravity, Urine: 1.014 (ref 1.005–1.030)
pH: 6 (ref 5.0–8.0)

## 2011-01-10 LAB — CBC
Hemoglobin: 10.7 g/dL — ABNORMAL LOW (ref 12.0–15.0)
Hemoglobin: 12 g/dL (ref 12.0–15.0)
MCH: 33 pg (ref 26.0–34.0)
MCH: 33.2 pg (ref 26.0–34.0)
MCHC: 32.6 g/dL (ref 30.0–36.0)
MCHC: 32.6 g/dL (ref 30.0–36.0)
MCV: 101.2 fL — ABNORMAL HIGH (ref 78.0–100.0)
MCV: 101.9 fL — ABNORMAL HIGH (ref 78.0–100.0)
Platelets: 235 10*3/uL (ref 150–400)
Platelets: 275 10*3/uL (ref 150–400)
RBC: 3.24 MIL/uL — ABNORMAL LOW (ref 3.87–5.11)
RDW: 15.3 % (ref 11.5–15.5)
RDW: 15.8 % — ABNORMAL HIGH (ref 11.5–15.5)
WBC: 9.5 10*3/uL (ref 4.0–10.5)

## 2011-01-10 LAB — PROTIME-INR
INR: 1.17 (ref 0.00–1.49)
INR: 1.21 (ref 0.00–1.49)
INR: 1.79 — ABNORMAL HIGH (ref 0.00–1.49)
Prothrombin Time: 14.3 seconds (ref 11.6–15.2)
Prothrombin Time: 15.1 seconds (ref 11.6–15.2)

## 2011-01-10 LAB — BRAIN NATRIURETIC PEPTIDE
Pro B Natriuretic peptide (BNP): 1011 pg/mL — ABNORMAL HIGH (ref 0.0–100.0)
Pro B Natriuretic peptide (BNP): 1080 pg/mL — ABNORMAL HIGH (ref 0.0–100.0)

## 2011-01-10 LAB — BASIC METABOLIC PANEL
BUN: 19 mg/dL (ref 6–23)
BUN: 19 mg/dL (ref 6–23)
CO2: 36 mEq/L — ABNORMAL HIGH (ref 19–32)
Calcium: 8.4 mg/dL (ref 8.4–10.5)
Calcium: 9.1 mg/dL (ref 8.4–10.5)
Chloride: 94 mEq/L — ABNORMAL LOW (ref 96–112)
Creatinine, Ser: 1.03 mg/dL (ref 0.4–1.2)
Creatinine, Ser: 1.2 mg/dL (ref 0.4–1.2)
Creatinine, Ser: 1.28 mg/dL — ABNORMAL HIGH (ref 0.4–1.2)
GFR calc Af Amer: 52 mL/min — ABNORMAL LOW (ref >60)
GFR calc Af Amer: >60 mL/min (ref >60)
GFR calc non Af Amer: 40 mL/min — ABNORMAL LOW (ref >60)
GFR calc non Af Amer: 50 mL/min — ABNORMAL LOW (ref >60)
GFR calc non Af Amer: 51 mL/min — ABNORMAL LOW (ref >60)
Glucose, Bld: 104 mg/dL — ABNORMAL HIGH (ref 70–99)
Potassium: 3.7 mEq/L (ref 3.5–5.1)
Sodium: 135 mEq/L (ref 135–145)
Sodium: 139 mEq/L (ref 135–145)

## 2011-01-10 LAB — URINE MICROSCOPIC-ADD ON

## 2011-01-10 LAB — LIPID PANEL: VLDL: 16 mg/dL (ref 0–40)

## 2011-01-10 LAB — DIFFERENTIAL
Basophils Absolute: 0 10*3/uL (ref 0.0–0.1)
Basophils Relative: 0 % (ref 0–1)
Eosinophils Absolute: 0.1 10*3/uL (ref 0.0–0.7)
Eosinophils Relative: 2 % (ref 0–5)
Lymphs Abs: 2.1 10*3/uL (ref 0.7–4.0)

## 2011-01-10 LAB — HEMOGLOBIN A1C: Hgb A1c MFr Bld: 6.7 % — ABNORMAL HIGH (ref <5.7)

## 2011-01-24 ENCOUNTER — Ambulatory Visit (HOSPITAL_COMMUNITY)
Admission: RE | Admit: 2011-01-24 | Discharge: 2011-01-24 | Disposition: A | Discharge status: Home / Self Care | Payer: Medicare Other | Source: Ambulatory Visit | Attending: Cardiology | Admitting: Cardiology

## 2011-01-24 DIAGNOSIS — I519 Heart disease, unspecified: Secondary | ICD-10-CM | POA: Insufficient documentation

## 2011-01-24 DIAGNOSIS — Z7901 Long term (current) use of anticoagulants: Secondary | ICD-10-CM | POA: Insufficient documentation

## 2011-01-24 DIAGNOSIS — I359 Nonrheumatic aortic valve disorder, unspecified: Secondary | ICD-10-CM | POA: Insufficient documentation

## 2011-01-24 DIAGNOSIS — Z8673 Personal history of transient ischemic attack (TIA), and cerebral infarction without residual deficits: Secondary | ICD-10-CM | POA: Insufficient documentation

## 2011-01-24 DIAGNOSIS — I4891 Unspecified atrial fibrillation: Secondary | ICD-10-CM | POA: Insufficient documentation

## 2011-01-24 LAB — GLUCOSE, CAPILLARY: Glucose-Capillary: 161 mg/dL — ABNORMAL HIGH (ref 70–99)

## 2011-02-01 NOTE — Op Note (Signed)
  [name], [name]             ACCOUNT NO.:  [id number]  MEDICAL RECORD NO.:  [id number]           PATIENT TYPE:  O  LOCATION:  [facility]                         FACILITY:  [facility]  PHYSICIAN:  Armanda Magic, M.D.     DATE OF BIRTH:  1924-05-06  DATE OF PROCEDURE:  01/24/2011 DATE OF DISCHARGE:  01/24/2011                              OPERATIVE REPORT   REFERRING PHYSICIAN:  Hal T. Stoneking, MD  PROCEDURE:  Direct current cardioversion.  OPERATOR:  Armanda Magic, MD  INDICATIONS:  Atrial fibrillation.  COMPLICATIONS:  None.  IV MEDICATIONS:  Propofol 80 mg IV.  This is an 77 year old female who has a history of multiple CVAs in the past as well as a patent foramen ovale with left-to-right shunting, mild to moderate aortic valve stenosis with diastolic dysfunction by echo in May 2011 who has been having problems with atrial fibrillation.  She was placed on Pradaxa after a last stroke and subsequently just finished a treatment for a right lower lobe pneumonia several months ago and now presents for direct current cardioversion.  Of note, her most recent nuclear stress test showed decreased EF of 35% with a fixed defect in the basal inferior lateral region.  No evidence of ischemia.  She now presents for cardioversion.  The patient was brought to the day hospital in the fasting nonsedated state.  Informed consent was obtained.  The patient was connected to continuous heart rate and pulse oximetry monitor, intermittent blood pressure monitor.  Defibrillator pads were placed in left anterior chest and posterior back.  After adequate anesthesia was obtained, 150 joules synchronized biphasic shock was delivered which successfully converted the patient to sinus rhythm.  The patient tolerated the procedure well without complications.  The patient was subsequently discharged to home after fully awake and ambulating.  ASSESSMENT: 1. Atrial fibrillation. 2. Systemic anticoagulation  with Pradaxa. 3. Moderate left ventricular dysfunction. 4. Mild to moderate aortic stenosis. 5. Successful cardioversion to sinus rhythm.  PLAN:  We will discharge home once fully awake and ambulating.  I have instructed her to decrease her Lasix to 20 mg 2 tablets every morning due to lab work recently that showed a CO2 of 38.  Her potassium was also 5, so I told her to stop her potassium altogether.  She will follow up with Cristopher Peru, nurse practitioner in my office on February 08, 2011, at 9:45 a.m.  She will go to the lab prior after getting a basic metabolic panel.     Armanda Magic, M.D.     TT/MEDQ  D:  01/24/2011  T:  01/25/2011  Job:  [id number]  cc:   Hal T. Stoneking, M.D.  Electronically Signed by Armanda Magic M.D. on 02/01/2011 11:26:22 AM

## 2011-03-14 NOTE — Consult Note (Signed)
VASCULAR SURGERY CONSULTATION   [name], [name]  DOB:  01-10-1924                                       10/06/2008  HQION#:[id number]   The patient was referred for vascular surgery consultation by Dr.  Irving Shows for recent painful ulceration in the right fourth toe.  This 77-  year-old lady states that she developed sharp pain and tenderness in the  right fourth toe and 2-3 weeks ago she was started initially on  colchicine on November 25 and had some side effects but generally this  did improve her symptomatology.  There was some mild drainage from a  pinpoint opening on the lateral aspect of the fourth digit and the fluid  was sent for evaluation.  It was not purulent and did not have any  organisms or white blood cells and was felt to represent gout.  She has  continued to have improvement in her symptoms since that time although  not completely, not a completely resolved situation.  She states she  does have bilateral calf discomfort with ambulation right equal to left.  She has no true rest pain but does have numbness in her feet secondary  to diabetic neuropathy.  She has no previous history of similar  problems.   PAST MEDICAL HISTORY:  1. Diabetes noninsulin dependent.  2. Hypertension.  3. Hyperlipidemia.  4. Diabetic neuropathy.  5. Negative for coronary artery disease, COPD or stroke.   PAST SURGICAL HISTORY:  Bilateral total knee replacements.   FAMILY HISTORY:  Positive for coronary artery disease in all of her  family members, stroke in her mother and diabetes in brother and sister.   SOCIAL HISTORY:  She is widowed and retired.  She does not use tobacco  or alcohol.   REVIEW OF SYSTEMS:  Has occasional shortness of breath particularly on  exertion.  A history of some wheezing on occasion.  No productive cough  or bronchitis.  Has had mini strokes in the past with near syncope.  No  lateralizing signs.  Also has diffuse joint pain.   ALLERGIES:  To penicillin.   MEDICATIONS:  Please see health history form.   PHYSICAL EXAM:  Vital signs:  Blood pressure 121/63, heart rate 61,  respirations 18.  General:  She is an elderly female in no apparent  distress, alert and oriented x3.  Neck:  Supple, 3+ carotid pulses  palpable.  No bruits are audible.  Neurological:  Normal.  Upper  extremity pulses 3+ bilaterally.  Chest:  Clear to auscultation.  Cardiovascular:  Regular rhythm.  No murmurs.  Abdomen:  Soft, nontender  with no masses.  She has 3+ femoral, popliteal and dorsalis pedis pulse  in the left leg.  Her right leg has a 2+ femoral pulse with no popliteal  or distal pulse palpable.  There is some erythema in the right fourth  toe which is not particularly tender but is swollen compared to the left  fourth toe.  There is no drainage or crepitance noted.  No other  ulcerations are noted.  There is no distal edema or evidence of chronic  venous insufficiency.  Both feet otherwise appear adequately perfused.   Lower extremity arterial Dopplers were normal on the left side with an  ABI of 1.0, right leg being only 0.46 secondary to femoral popliteal  occlusive disease.  I feel that she does have right superficial femoral occlusion and tibial  disease on the right.  The episode of gout hopefully will continue to  resolve and not recur but if she does have persistent pain or incomplete  healing of the right fourth toe she may require an angiogram to see if  she is a candidate for revascularization.  As long as this heals and she  has no further symptoms I would not recommend any further evaluation at  this time.  I would be happy to see her again in the future as  necessary.   Quita Skye Hart Rochester, M.D.  Electronically Signed  JDL/MEDQ  D:  10/06/2008  T:  10/07/2008  Job:  [id number]   cc:   Dr Vear Clock, M.D.

## 2011-03-17 NOTE — Op Note (Signed)
[place]. [facility]  Patient:    Judith, [name]                    MRN: [id number] Proc. Date: 06/12/01 Adm. Date:  [id number] Attending:  Twana First                           Operative Report  PREOPERATIVE DIAGNOSIS:  Right knee degenerative joint disease.  POSTOPERATIVE DIAGNOSIS:  Right knee degenerative joint disease.  PROCEDURE:  Right total knee replacement using [employer] Scorpio total knee system with #7 cemented femoral component, #7 cemented tibial component with 12-mm polyethylene tibial spacer and 26-mm polyethylene cemented patella.  SURGEON:  Elana Alm. Thurston Hole, M.D.  ASSISTANT:  Julien Girt, P.A.  ANESTHESIA:  General.  OPERATIVE TIME:  One hour and 20 minutes.  COMPLICATIONS:  None.  DESCRIPTION OF PROCEDURE:  [name] was brought to the operating room on June 12, 2001 and placed on the operative table in supine position.  After being placed under general anesthesia, her right knee was examined under anesthesia; range of motion from 0 to 125 degrees, moderate varus deformity, knee stable to ligamentous exam with normal patellar tracking.  She had a Foley catheter placed under sterile conditions and received vancomycin 1 g IV preoperatively for prophylaxis.  The right leg was prepped using sterile Betadine and draped using sterile technique.  Leg was exsanguinated and a thigh tourniquet elevated to 350 mm.  Initially, through a 20-cm longitudinal incision based over the patella, initial exposure was made.  The underlying subcutaneous tissues were incised in line with the skin incision.  A median arthrotomy was performed revealing an excessive amount of normal-appearing joint fluid.  The articular surfaces were examined.  She had grade 4 changes throughout the medial side, grade 2 and 3 changes laterally and grade 3 and 4 changes in the patellofemoral joint.  The medial and lateral meniscal remnants were removed  as well as the anterior cruciate ligament and osteophytes off the femoral condyles.  An intramedullary drill was then drilled up the femoral canal for placement of the distal femoral cutting jig, which was placed in the appropriate amount of rotation, and then a distal 10-mm cut was made.  The distal femur was then sized; a #7 was found to be the appropriate size and the #7 cutting jig was placed and these cuts were made. The proximal tibia was exposed.  The tibial spines were removed with an oscillating saw.  Intramedullary drill drilled down the tibial canal for placement of the proximal tibial cutting jig, which was placed in the appropriate amount of rotation, and using a 6-mm reference point off the medial or lower side, a proximal 6-mm cut was made.  After this was done, then the Scorpio PCL cutter was placed back on the distal femur and these cuts were made.   After this was done, then the #7 femoral trial was placed.  A #7 tibial trial was placed with a 12-mm polyeythelene spacer.  This was found to give excellent range of motion, 0 to 125 degrees, with no lift-off on the tray and excellent stability.  The tibial tray was then marked for rotation and the keel cut was made.  After this was done, the patella was sized; a 26-mm was found to be the appropriate size and a recessed 10 x 26-mm cut was made and three locking holes placed.  After this was done,  it was felt that all the trial components were of excellent size, fit and stability.  The knee was then jet-lavage irrigated with 3 L of saline solution.  The proximal tibia was then exposed and the tibial base plate #7 with cement backing was hammered into position with an excellent fit, with excess cement being removed from around the edges.  The femoral component #7 with cement backing was hammered into position, also with excellent fit, with excess cement being removed from the edges.  The 12-mm polyethylene spacer was then locked  on the tibial base plate; this gave an excellent stability and range of motion with no lift-off of the tray.  A 26-mm patellar prosthesis with cement backing was placed in its recessed hole and held there with a clamp, again with all excess cement being removed from around the edges.  After the cement hardened, patellofemoral tracking was evaluated and this was found to be normal with no need for a lateral release.  At this point, it was felt that all the components were of excellent size, fit and stability.  The knee was further irrigated with antibiotic solution and then the median arthrotomy was closed with #2 Ethibond suture.  Subcutaneous tissues were closed with 0 and 2-0 Vicryl, skin closed with skin staples; a Hemovac had been placed in the knee joint prior to this closure.  The Hemovac was then injected with 0.25% Marcaine with epinephrine and then clamped.  Tourniquet was released. Sterile dressing and a long leg splint were applied and then the patient awakened and taken to the recovery room in a stable condition.  FOLLOWUP CARE:  [name] will be followed as an inpatient.  She will be advanced, weightbearing as tolerated.  She will be discharged when ambulatory and stable. DD:  06/12/01 TD:  06/12/01 Job: [id number]

## 2011-03-17 NOTE — Discharge Summary (Signed)
[place]. [facility]  Patient:    [name], [name] Visit Number: [id number] MRN: [id number]          Service Type: [facility] Location: [id number] Attending Physician:  Herold Harms Dictated by:   Mcarthur Rossetti. Angiulli, P.A. Admit Date:  06/17/2001 Discharge Date: 07/01/2001   CC:         Molly Maduro A. Thurston Hole, M.D.  Genene Churn. Sherin Quarry, M.D.   Discharge Summary  DISCHARGE DIAGNOSES: 1. Right total knee arthroplasty, June 12, 2001. 2. Postoperative anemia. 3. Non-insulin-dependent diabetes mellitus. 4. History of a left total knee arthroplasty.  HISTORY OF PRESENT ILLNESS:  [age]-year-old female with history of a left total knee arthroplasty, who was admitted June 12, 2001 with end-stage degenerative joint disease now of the right knee and no relief with conservative care, underwent a right total knee arthroplasty, June 12, 2001, per Dr. Elana Alm. Wainer.  Placed on Coumadin for deep venous thrombosis prophylaxis and weightbearing as tolerated.  Postoperative anemia and transfused.  No chest pain or shortness of breath.  Contact guard for ambulation.  Latest INR of 1.9.  Chemistries unremarkable.  Admitted for a comprehensive rehab program.  PAST MEDICAL HISTORY:  See discharge diagnoses.  PAST SURGICAL HISTORY:  Tonsillectomy, appendectomy, hysterectomy.  ALLERGIES:  PENICILLIN and ASPIRIN.  PRIMARY MEDICAL DOCTOR:  Dr. Sherin Quarry.  HABITS:  No alcohol or tobacco.  MEDICATIONS PRIOR TO ADMISSION:  Meclizine, Glucotrol, Prilosec, Lipitor, Premarin and Tranxene.  SOCIAL HISTORY:  Lives in [place] with husband.  Independent prior to admission with a cane.  One-level home, five steps to entry.  Husband with Alzheimers disease and limited assistance.  Son in the area to assist on discharge.  HOSPITAL COURSE:  Patient with progressive gains while in rehabilitation services, with therapies initiated on a b.i.d. basis.  The following  issues were followed during patients rehab course:  Pertaining to Mrs. Osbornes right total knee arthroplasty, it remained stable, surgical site healing nicely, staples had been removed, no signs of infection.  She was ambulating with a walker, weightbearing as tolerated.  She continued on Coumadin for deep venous thrombosis prophylaxis.  Venous Dopplers studies x 2 prior to discharge were negative.  She did have some persistent right calf discomfort which was followed up by orthopedic services.  A CT scan of the right knee with contrast showed a mild effusion, small hematoma, medial aspect of the soleus muscle; was advised to continue Coumadin therapy.  Right ankle x-rays were negative. Her calf pain had much improved by time of discharge.  She was using Ultram as needed for pain.  Her blood sugars remained controlled on Glucotrol as well as diabetic diet.  Her blood pressures were controlled with no orthostatic changes.  She had no bowel or bladder disturbances.  She was now independent in her room, ambulating extended distances.  She would follow up with Dr. Thurston Hole in two weeks.  Latest labs showed an INR of 3.3, hemoglobin 10.2, hematocrit 29.6; this showed a generous improvement from 9.7 hemoglobin.  Chemistries with a sodium 138, potassium 3.5, BUN 10, creatinine 0.8.  DISCHARGE MEDICATIONS: 1. Coumadin 5 mg one-half tablet alternating with one tablet every other day,    to be completed July 14, 2001. 2. Glucotrol 5 mg daily. 3. Meclizine 25 mg four times daily. 4. Prilosec 20 mg daily. 5. Premarin 0.625 mg daily. 6. Tranxene 3.75 mg daily. 7. Trinsicon twice daily. 8. Ultram one tablet three times a day for pain. 9. Robaxin  as needed for spasms.  ACTIVITY:  Weightbearing as tolerated.  DIET:  Two-thousand-calorie ADA.  FOLLOWUP:  Home health nurse for prothrombin time on July 03, 2001, to be followed by [facility].  She should follow up with Dr. Thurston Hole  in one to two weeks. Dictated by:   Mcarthur Rossetti. Angiulli, P.A. Attending Physician:  Herold Harms DD:  07/02/01 TD:  07/02/01 Job: [id number]

## 2011-03-17 NOTE — Discharge Summary (Signed)
[place]. [facility]  Patient:    [name], [name] Visit Number: [id number] MRN: [id number]          Service Type: [facility] Location: [id number] Attending Physician:  Herold Harms Dictated by:   Julien Girt, P.A. Admit Date:  06/17/2001 Discharge Date: 07/01/2001                             Discharge Summary  ADMITTING DIAGNOSES: 1. End-stage degenerative joint disease, right knee. 2. Diabetes mellitus. 3. Gastroesophageal reflux. 4. Cardiomegaly.  DISCHARGE DIAGNOSES: 1. End-stage degenerative joint disease, right knee, status post total knee replacement. 2. Postoperative blood loss anemia. 3. Diabetes type 2. 4. Esophageal reflux. 5. Cardiomegaly. 6. Current use of anticoagulants for deep venous thrombosis prophylaxis.  HISTORY OF PRESENT ILLNESS:  The patient is a 77 year old female with a history of bilateral knee DJD.  She had a left total knee replacement six to seven years ago and now continues to have right knee pain that hurts with rest and hurts with activity, unrelieved by antiinflammatories or Cortizone injections.  She understands risks, benefits and possible complications of a total knee replacement and is without questions.  PROCEDURE:  On June 12, 2001, the patient underwent a right total knee replacement by Dr. Thurston Hole.  She tolerated the procedure well.  HOSPITAL COURSE:  The patient was admitted postoperatively for pain control, DVT prophylaxis and physical therapy.  On postop day #1, the patient progressed well with minimal pain.  Hemoglobin was 9.9.  INR was 1.2.  She was metabolically stable.  Her drain was discontinued and her Foley was discontinued.  On postop day #2, the patient still continued to progress with a T-max of 100.2.  Hemoglobin was 9.3, hematocrit 26.8 and INR 1.4.  She was metabolically stable except for a high glucose at 174.  On postop day #3, hemoglobin was down to 8.4.  She was transfused  two units of packed rbcs. She continued on physical therapy.  On postop day #4, hemoglobin was 9.6 and surgical wound was well-approximated.  She was progressing with physical therapy, although slow.  Rehabilitation consult was ordered.  On postop day #5, INR was 1.9.  Surgical wound was well-approximated.  She was progressing well, but slowly with physical therapy.  She was transferred to rehabilitation in stable condition for aggressive physical therapy.  ACTIVITY:  Weightbearing as tolerated.  DIET:  Regular.  DISCHARGE MEDICATIONS: 1. Percocet one to two q.4-6h. p.r.n. pain. 2. Meclizine 25 mg q.i.d. 3. Glucotrol 5 mg q.d. 4. Prilosec 20 mg q.d. 5. Lipitor 20 mg q.d. 6. Lorazepam 3.75 mg. 7. Premarin one p.o. q.d. 8. Coumadin 3 mg on the date of discharge once a day. 9. Colace 100 mg b.i.d.  DISPOSITION:  She was discharged to rehabilitation.  CONDITION ON DISCHARGE:  Stable.  FOLLOWUP:  We will continue to follow her on rehabilitation.Dictated by: Julien Girt, P.A. Attending Physician:  Herold Harms DD:  07/26/01 TD:  07/26/01 Job: [id number]

## 2011-03-17 NOTE — H&P (Signed)
NAMEALAINE, LOUGHNEY NO.:  [id number]   MEDICAL RECORD NO.:  [id number]          PATIENT TYPE:  INP   LOCATION:  1829                         FACILITY:  [facility]   PHYSICIAN:  Theressa Millard, M.D.    DATE OF BIRTH:  Aug 29, 1924   DATE OF ADMISSION:  05/25/2005  DATE OF DISCHARGE:                                HISTORY & PHYSICAL   [name] is a 77 year old white female admitted with chest pain.   She has no prior history of heart disease.  She felt well until  approximately 3:30 today when she was ironing and developed scapular pain  worse on the right than the left.  Over the subsequent three hours she  developed upper bilateral chest discomfort and then tightness in the mid  chest that was quite severe.  She had associated shortness of breath.  There  was no nausea or diaphoresis.  She called the EMTs at approximately 7 p.m.  She was given aspirin x3 and nitroglycerin x3 with no benefit.  Given  morphine in the emergency department and most of her pain went away.  She  does describe that the pain was pleuritic and worse when she would take a  deep breath.   Cardiac risk factors include a prominent family history, diabetes, and  hyperlipidemia.  She did have a negative stress test in early 2006 which she  reports as negative.   PAST MEDICAL HISTORY:  1.  Diabetes mellitus with no complications.  2.  Hypertension.   PAST SURGICAL HISTORY:  1.  Total knee replacement on both sides.  2.  Hysterectomy.  3.  Bladder surgery x2.   MEDICATIONS:  Doses are not available but her medications are Lipitor,  Glipizide, Prilosec, clorazepate, Premarin, Toprol, and Benicar.   ALLERGIES:  PENICILLIN.  ASPIRIN upsets her stomach.   SOCIAL HISTORY:  Cigarettes:  None.  Alcohol:  None.  She is widowed and  lives alone.  Very supportive family.   FAMILY HISTORY:  Cardiac disease is prominent.  No history of  thromboembolism.   REVIEW OF SYSTEMS:  Frequent wheezing,  but never has been diagnosed with  asthma.  All other systems are negative.   PHYSICAL EXAMINATION:  GENERAL:  Well-developed, well-nourished, in no  distress.  VITAL SIGNS:  Blood pressure 164/63, pulse 73, respiratory rate 20, O2  saturation 95% on 2 L.  HEENT:  Eyes have pupils which are round and reactive to light.  Extraocular  movements are intact.  Funduscopic examination is unremarkable.  Ears are  normal.  Nose and throat are unremarkable.  NECK:  Supple.  Thyroid is not enlarged or tender.  CHEST:  Clear to auscultation and percussion.  CARDIAC:  Regular rhythm and rate with normal S1 and S2 without S3, S4,  murmur, rub, or click.  ABDOMEN:  Soft and nontender with normal bowel sounds without  hepatosplenomegaly or mass.  NEUROLOGIC:  She is oriented x3.  Cranial nerves are intact.  Motor is 5/5.  SKIN:  Dry and warm.  There is no peripheral edema.   LABORATORY DATA:  Sodium 137, potassium 4.4, BUN  31, creatinine 1.3.  CK x2  is negative.  White count 15,000 with a left shift.  Hemoglobin 12.2.   EKG shows right bundle branch block.   Chest x-ray shows left base atelectasis.   IMPRESSION:  1.  Chest pain, shortness of breath.  Differential diagnosis is cardiac      sources versus pulmonary embolism versus doubtful pneumonia.  2.  Leukocytosis.  3.  Diabetes.  4.  Hypertension.  Obtain spiral CT tonight.  If positive proceed with      heparin and Coumadin.  If negative will proceed with cardiac evaluation.       JO/MEDQ  D:  05/26/2005  T:  05/26/2005  Job:  [id number]

## 2011-04-03 ENCOUNTER — Other Ambulatory Visit: Payer: Self-pay | Admitting: Geriatric Medicine

## 2011-04-03 DIAGNOSIS — Z1231 Encounter for screening mammogram for malignant neoplasm of breast: Secondary | ICD-10-CM

## 2011-04-28 ENCOUNTER — Ambulatory Visit
Admission: RE | Admit: 2011-04-28 | Discharge: 2011-04-28 | Disposition: A | Discharge status: Home / Self Care | Payer: Medicare Other | Source: Ambulatory Visit | Attending: Geriatric Medicine | Admitting: Geriatric Medicine

## 2011-04-28 DIAGNOSIS — Z1231 Encounter for screening mammogram for malignant neoplasm of breast: Secondary | ICD-10-CM

## 2012-04-18 ENCOUNTER — Other Ambulatory Visit: Payer: Self-pay | Admitting: Geriatric Medicine

## 2012-04-18 DIAGNOSIS — Z1231 Encounter for screening mammogram for malignant neoplasm of breast: Secondary | ICD-10-CM

## 2012-05-03 ENCOUNTER — Ambulatory Visit
Admission: RE | Admit: 2012-05-03 | Discharge: 2012-05-03 | Disposition: A | Discharge status: Home / Self Care | Payer: Medicare Other | Source: Ambulatory Visit | Attending: Geriatric Medicine | Admitting: Geriatric Medicine

## 2012-05-03 DIAGNOSIS — Z1231 Encounter for screening mammogram for malignant neoplasm of breast: Secondary | ICD-10-CM

## 2012-05-21 ENCOUNTER — Ambulatory Visit
Admission: RE | Admit: 2012-05-21 | Discharge: 2012-05-21 | Disposition: A | Discharge status: Home / Self Care | Payer: Medicare Other | Source: Ambulatory Visit | Attending: *Deleted | Admitting: *Deleted

## 2012-05-21 ENCOUNTER — Other Ambulatory Visit: Payer: Self-pay | Admitting: *Deleted

## 2012-05-21 DIAGNOSIS — R109 Unspecified abdominal pain: Secondary | ICD-10-CM

## 2012-05-21 MED ORDER — IOHEXOL 300 MG/ML  SOLN
100.0000 mL | Freq: Once | INTRAMUSCULAR | Status: AC | PRN
  Administered 2012-05-21: 100 mL via INTRAVENOUS

## 2012-05-28 DIAGNOSIS — I209 Angina pectoris, unspecified: Secondary | ICD-10-CM

## 2012-05-28 HISTORY — DX: Angina pectoris, unspecified: I20.9

## 2012-05-29 ENCOUNTER — Emergency Department (HOSPITAL_COMMUNITY): Payer: Medicare Other

## 2012-05-29 ENCOUNTER — Inpatient Hospital Stay (HOSPITAL_COMMUNITY)
Admission: EM | Admit: 2012-05-29 | Discharge: 2012-06-06 | DRG: 871 | Disposition: A | Discharge status: Rehabilitation Facility | Payer: Medicare Other | Attending: Internal Medicine | Admitting: Internal Medicine

## 2012-05-29 ENCOUNTER — Encounter (HOSPITAL_COMMUNITY): Payer: Self-pay

## 2012-05-29 ENCOUNTER — Inpatient Hospital Stay (HOSPITAL_COMMUNITY): Payer: Medicare Other

## 2012-05-29 DIAGNOSIS — J9601 Acute respiratory failure with hypoxia: Secondary | ICD-10-CM | POA: Diagnosis not present

## 2012-05-29 DIAGNOSIS — E876 Hypokalemia: Secondary | ICD-10-CM | POA: Diagnosis present

## 2012-05-29 DIAGNOSIS — R197 Diarrhea, unspecified: Secondary | ICD-10-CM

## 2012-05-29 DIAGNOSIS — I5030 Unspecified diastolic (congestive) heart failure: Secondary | ICD-10-CM | POA: Diagnosis present

## 2012-05-29 DIAGNOSIS — A419 Sepsis, unspecified organism: Principal | ICD-10-CM | POA: Diagnosis present

## 2012-05-29 DIAGNOSIS — I129 Hypertensive chronic kidney disease with stage 1 through stage 4 chronic kidney disease, or unspecified chronic kidney disease: Secondary | ICD-10-CM | POA: Diagnosis present

## 2012-05-29 DIAGNOSIS — E871 Hypo-osmolality and hyponatremia: Secondary | ICD-10-CM | POA: Diagnosis present

## 2012-05-29 DIAGNOSIS — E039 Hypothyroidism, unspecified: Secondary | ICD-10-CM | POA: Diagnosis present

## 2012-05-29 DIAGNOSIS — J811 Chronic pulmonary edema: Secondary | ICD-10-CM

## 2012-05-29 DIAGNOSIS — N189 Chronic kidney disease, unspecified: Secondary | ICD-10-CM | POA: Diagnosis present

## 2012-05-29 DIAGNOSIS — I4891 Unspecified atrial fibrillation: Secondary | ICD-10-CM | POA: Diagnosis present

## 2012-05-29 DIAGNOSIS — R51 Headache: Secondary | ICD-10-CM

## 2012-05-29 DIAGNOSIS — E43 Unspecified severe protein-calorie malnutrition: Secondary | ICD-10-CM | POA: Diagnosis present

## 2012-05-29 DIAGNOSIS — G4733 Obstructive sleep apnea (adult) (pediatric): Secondary | ICD-10-CM

## 2012-05-29 DIAGNOSIS — I959 Hypotension, unspecified: Secondary | ICD-10-CM | POA: Diagnosis not present

## 2012-05-29 DIAGNOSIS — A0472 Enterocolitis due to Clostridium difficile, not specified as recurrent: Secondary | ICD-10-CM | POA: Diagnosis present

## 2012-05-29 DIAGNOSIS — I5033 Acute on chronic diastolic (congestive) heart failure: Secondary | ICD-10-CM | POA: Diagnosis present

## 2012-05-29 DIAGNOSIS — D72829 Elevated white blood cell count, unspecified: Secondary | ICD-10-CM | POA: Diagnosis present

## 2012-05-29 DIAGNOSIS — K55029 Acute infarction of small intestine, extent unspecified: Secondary | ICD-10-CM

## 2012-05-29 DIAGNOSIS — K5900 Constipation, unspecified: Secondary | ICD-10-CM

## 2012-05-29 DIAGNOSIS — K559 Vascular disorder of intestine, unspecified: Secondary | ICD-10-CM | POA: Diagnosis present

## 2012-05-29 DIAGNOSIS — Z96659 Presence of unspecified artificial knee joint: Secondary | ICD-10-CM

## 2012-05-29 DIAGNOSIS — A498 Other bacterial infections of unspecified site: Secondary | ICD-10-CM | POA: Diagnosis present

## 2012-05-29 DIAGNOSIS — R079 Chest pain, unspecified: Secondary | ICD-10-CM | POA: Diagnosis present

## 2012-05-29 DIAGNOSIS — I509 Heart failure, unspecified: Secondary | ICD-10-CM | POA: Diagnosis present

## 2012-05-29 DIAGNOSIS — E119 Type 2 diabetes mellitus without complications: Secondary | ICD-10-CM | POA: Diagnosis present

## 2012-05-29 DIAGNOSIS — N390 Urinary tract infection, site not specified: Secondary | ICD-10-CM | POA: Diagnosis present

## 2012-05-29 DIAGNOSIS — E079 Disorder of thyroid, unspecified: Secondary | ICD-10-CM

## 2012-05-29 DIAGNOSIS — R1110 Vomiting, unspecified: Secondary | ICD-10-CM

## 2012-05-29 DIAGNOSIS — I482 Chronic atrial fibrillation, unspecified: Secondary | ICD-10-CM | POA: Diagnosis present

## 2012-05-29 DIAGNOSIS — J45909 Unspecified asthma, uncomplicated: Secondary | ICD-10-CM | POA: Insufficient documentation

## 2012-05-29 DIAGNOSIS — J9600 Acute respiratory failure, unspecified whether with hypoxia or hypercapnia: Secondary | ICD-10-CM | POA: Diagnosis present

## 2012-05-29 DIAGNOSIS — B9620 Unspecified Escherichia coli [E. coli] as the cause of diseases classified elsewhere: Secondary | ICD-10-CM | POA: Diagnosis present

## 2012-05-29 DIAGNOSIS — Z66 Do not resuscitate: Secondary | ICD-10-CM | POA: Diagnosis present

## 2012-05-29 HISTORY — DX: Sleep apnea, unspecified: G47.30

## 2012-05-29 HISTORY — DX: Peripheral vascular disease, unspecified: I73.9

## 2012-05-29 HISTORY — DX: Headache: R51

## 2012-05-29 HISTORY — DX: Unspecified asthma, uncomplicated: J45.909

## 2012-05-29 HISTORY — DX: Personal history of other diseases of the musculoskeletal system and connective tissue: Z87.39

## 2012-05-29 HISTORY — DX: Polyneuropathy, unspecified: G62.9

## 2012-05-29 HISTORY — DX: Pneumonia, unspecified organism: J18.9

## 2012-05-29 HISTORY — DX: Angina pectoris, unspecified: I20.9

## 2012-05-29 HISTORY — DX: Cardiomegaly: I51.7

## 2012-05-29 HISTORY — DX: Essential (primary) hypertension: I10

## 2012-05-29 HISTORY — DX: Pure hypercholesterolemia, unspecified: E78.00

## 2012-05-29 HISTORY — DX: Hypothyroidism, unspecified: E03.9

## 2012-05-29 HISTORY — DX: Cerebral infarction, unspecified: I63.9

## 2012-05-29 LAB — PRO B NATRIURETIC PEPTIDE: Pro B Natriuretic peptide (BNP): 1621 pg/mL — ABNORMAL HIGH (ref 0–450)

## 2012-05-29 LAB — URINALYSIS, ROUTINE W REFLEX MICROSCOPIC
Glucose, UA: NEGATIVE mg/dL
Hgb urine dipstick: NEGATIVE
Specific Gravity, Urine: 1.008 (ref 1.005–1.030)
Urobilinogen, UA: 0.2 mg/dL (ref 0.0–1.0)

## 2012-05-29 LAB — CARDIAC PANEL(CRET KIN+CKTOT+MB+TROPI)
CK, MB: 3.5 ng/mL (ref 0.3–4.0)
Relative Index: INVALID (ref 0.0–2.5)
Total CK: 47 U/L (ref 7–177)

## 2012-05-29 LAB — COMPREHENSIVE METABOLIC PANEL
AST: 36 U/L (ref 0–37)
Albumin: 2.2 g/dL — ABNORMAL LOW (ref 3.5–5.2)
Alkaline Phosphatase: 86 U/L (ref 39–117)
Chloride: 90 mEq/L — ABNORMAL LOW (ref 96–112)
Potassium: 2.9 mEq/L — ABNORMAL LOW (ref 3.5–5.1)
Sodium: 131 mEq/L — ABNORMAL LOW (ref 135–145)
Total Bilirubin: 0.5 mg/dL (ref 0.3–1.2)
Total Protein: 5.4 g/dL — ABNORMAL LOW (ref 6.0–8.3)

## 2012-05-29 LAB — POCT I-STAT TROPONIN I

## 2012-05-29 LAB — CBC
MCV: 94.6 fL (ref 78.0–100.0)
Platelets: 262 10*3/uL (ref 150–400)
RBC: 3.67 MIL/uL — ABNORMAL LOW (ref 3.87–5.11)
WBC: 11.7 10*3/uL — ABNORMAL HIGH (ref 4.0–10.5)

## 2012-05-29 LAB — URINE MICROSCOPIC-ADD ON

## 2012-05-29 MED ORDER — DABIGATRAN ETEXILATE MESYLATE 75 MG PO CAPS
75.0000 mg | ORAL_CAPSULE | Freq: Two times a day (BID) | ORAL | Status: DC
  Administered 2012-05-29 – 2012-05-30 (×2): 75 mg via ORAL
  Filled 2012-05-29 (×3): qty 1

## 2012-05-29 MED ORDER — DILTIAZEM HCL ER BEADS 240 MG PO CP24: 240.0000 mg | ORAL_CAPSULE | Freq: Every day | ORAL | Status: DC

## 2012-05-29 MED ORDER — FUROSEMIDE 10 MG/ML IJ SOLN
40.0000 mg | Freq: Two times a day (BID) | INTRAMUSCULAR | Status: DC
  Administered 2012-05-30 (×2): 40 mg via INTRAVENOUS
  Filled 2012-05-29 (×3): qty 4

## 2012-05-29 MED ORDER — GABAPENTIN 300 MG PO CAPS
300.0000 mg | ORAL_CAPSULE | Freq: Every day | ORAL | Status: DC
  Administered 2012-05-29 – 2012-06-05 (×8): 300 mg via ORAL
  Filled 2012-05-29 (×10): qty 1

## 2012-05-29 MED ORDER — GLIPIZIDE ER 5 MG PO TB24
5.0000 mg | ORAL_TABLET | Freq: Every day | ORAL | Status: DC
  Administered 2012-05-30: 5 mg via ORAL
  Filled 2012-05-29 (×2): qty 1

## 2012-05-29 MED ORDER — SODIUM CHLORIDE 0.9 % IV SOLN
1000.0000 mL | INTRAVENOUS | Status: DC
  Administered 2012-05-29 – 2012-05-30 (×3): 1000 mL via INTRAVENOUS

## 2012-05-29 MED ORDER — POTASSIUM CHLORIDE 10 MEQ/100ML IV SOLN
10.0000 meq | INTRAVENOUS | Status: AC
  Administered 2012-05-29 (×3): 10 meq via INTRAVENOUS
  Filled 2012-05-29 (×3): qty 100

## 2012-05-29 MED ORDER — CIPROFLOXACIN HCL 500 MG PO TABS
500.0000 mg | ORAL_TABLET | Freq: Two times a day (BID) | ORAL | Status: DC
  Administered 2012-05-30 (×2): 500 mg via ORAL
  Filled 2012-05-29 (×3): qty 1

## 2012-05-29 MED ORDER — ADULT MULTIVITAMIN W/MINERALS CH
1.0000 | ORAL_TABLET | Freq: Every day | ORAL | Status: DC
  Administered 2012-05-30: 1 via ORAL
  Filled 2012-05-29 (×2): qty 1

## 2012-05-29 MED ORDER — ZOLPIDEM TARTRATE 5 MG PO TABS
5.0000 mg | ORAL_TABLET | Freq: Every day | ORAL | Status: DC
  Administered 2012-06-03 – 2012-06-05 (×3): 5 mg via ORAL
  Filled 2012-05-29 (×3): qty 1

## 2012-05-29 MED ORDER — CIPROFLOXACIN IN D5W 200 MG/100ML IV SOLN
200.0000 mg | Freq: Once | INTRAVENOUS | Status: AC
  Administered 2012-05-29: 200 mg via INTRAVENOUS
  Filled 2012-05-29: qty 100

## 2012-05-29 MED ORDER — MULTI-VITAMIN/MINERALS PO TABS
1.0000 | ORAL_TABLET | Freq: Every day | ORAL | Status: DC
Start: 2012-05-29 — End: 2012-05-29

## 2012-05-29 MED ORDER — CARVEDILOL 3.125 MG PO TABS
3.1250 mg | ORAL_TABLET | Freq: Two times a day (BID) | ORAL | Status: DC
  Administered 2012-05-30 (×2): 3.125 mg via ORAL
  Filled 2012-05-29 (×4): qty 1

## 2012-05-29 MED ORDER — FUROSEMIDE 20 MG PO TABS: 80.0000 mg | ORAL_TABLET | Freq: Every day | ORAL | Status: DC

## 2012-05-29 MED ORDER — LISINOPRIL 2.5 MG PO TABS
2.5000 mg | ORAL_TABLET | Freq: Every day | ORAL | Status: DC
  Administered 2012-05-30: 2.5 mg via ORAL
  Filled 2012-05-29: qty 1

## 2012-05-29 MED ORDER — LEVOTHYROXINE SODIUM 88 MCG PO TABS
88.0000 ug | ORAL_TABLET | Freq: Every day | ORAL | Status: DC
  Administered 2012-05-30: 88 ug via ORAL
  Filled 2012-05-29 (×3): qty 1

## 2012-05-29 MED ORDER — SODIUM CHLORIDE 0.9 % IJ SOLN: 3.0000 mL | INTRAMUSCULAR | Status: DC | PRN

## 2012-05-29 MED ORDER — POTASSIUM CHLORIDE CRYS ER 20 MEQ PO TBCR
40.0000 meq | EXTENDED_RELEASE_TABLET | Freq: Every day | ORAL | Status: DC
  Filled 2012-05-29: qty 2

## 2012-05-29 MED ORDER — ASPIRIN EC 81 MG PO TBEC
81.0000 mg | DELAYED_RELEASE_TABLET | Freq: Every day | ORAL | Status: DC
  Administered 2012-05-30: 81 mg via ORAL
  Filled 2012-05-29: qty 1

## 2012-05-29 MED ORDER — ALLOPURINOL 300 MG PO TABS
300.0000 mg | ORAL_TABLET | Freq: Every day | ORAL | Status: DC
  Administered 2012-05-30 – 2012-06-06 (×8): 300 mg via ORAL
  Filled 2012-05-29 (×9): qty 1

## 2012-05-29 MED ORDER — SODIUM CHLORIDE 0.9 % IJ SOLN
3.0000 mL | Freq: Two times a day (BID) | INTRAMUSCULAR | Status: DC
  Administered 2012-05-31 – 2012-06-06 (×9): 3 mL via INTRAVENOUS

## 2012-05-29 MED ORDER — SODIUM CHLORIDE 0.9 % IV SOLN: 250.0000 mL | INTRAVENOUS | Status: DC | PRN

## 2012-05-29 MED ORDER — ONDANSETRON HCL 4 MG/2ML IJ SOLN
4.0000 mg | Freq: Four times a day (QID) | INTRAMUSCULAR | Status: DC | PRN
  Administered 2012-05-30 – 2012-06-05 (×2): 4 mg via INTRAVENOUS
  Filled 2012-05-29 (×2): qty 2

## 2012-05-29 MED ORDER — POTASSIUM CHLORIDE ER 8 MEQ PO TBCR: 8.0000 meq | EXTENDED_RELEASE_TABLET | Freq: Two times a day (BID) | ORAL | Status: DC

## 2012-05-29 MED ORDER — ATORVASTATIN CALCIUM 10 MG PO TABS
10.0000 mg | ORAL_TABLET | Freq: Every day | ORAL | Status: DC
  Administered 2012-05-30 – 2012-06-06 (×8): 10 mg via ORAL
  Filled 2012-05-29 (×9): qty 1

## 2012-05-29 MED ORDER — ACETAMINOPHEN 325 MG PO TABS
650.0000 mg | ORAL_TABLET | ORAL | Status: DC | PRN
  Administered 2012-05-31 – 2012-06-04 (×4): 650 mg via ORAL
  Filled 2012-05-29 (×4): qty 2

## 2012-05-29 MED ORDER — ONDANSETRON HCL 4 MG/2ML IJ SOLN
INTRAMUSCULAR | Status: AC
  Administered 2012-05-29: 13:00:00
  Filled 2012-05-29: qty 2

## 2012-05-29 NOTE — ED Notes (Signed)
MD at bedside. 

## 2012-05-29 NOTE — ED Notes (Signed)
Pt transported to Xray. 

## 2012-05-29 NOTE — Progress Notes (Addendum)
[name], is a 78 y.o. female,   MRN: [id number]  -  DOB - 08/12/1924  Outpatient Primary MD for the patient is Ginette Otto, MD  Chief Complaint:    Chief Complaint  Patient presents with  . Chest Pain     Blood pressure 108/54, pulse 79, temperature 98.2 F (36.8 C), temperature source Oral, resp. rate 22, SpO2 100.00%.  Active Problems:  Chest pain  Constipation  Vomiting  Atrial fibrillation  CHF (congestive heart failure)  Hypokalemia  Hyponatremia   78 yo with history of grade 1 diastolic dysfunction in 2011, atrial fibrillation (on pradaxa), hypothyroidism, and DM presents with complaints of a knot in her chest.  The feeling started last evening and was associated with nausea and vomiting.  The chest pain was relieved in the ED with zofran and nitroglycerin.    Of note the patient had a CT abdomen on 05/21/2012 that show PSBO with "feces sign",  As well as cholelithiasis with gall bladder wall calcifications.  She has extensive arthrosclerosis.  I will request inpatient admission to a telemetry bed.  Rule out ACS, PSBO.  Will order cardiac enzymes and BNP.  Also 2 view abdominal xray to be done in the ED.  Will also begin to replete potassium with 4 runs IV.  Algis Downs, PA-C [facility] Pager: [phone]

## 2012-05-29 NOTE — ED Provider Notes (Signed)
History     CSN: [id number]  Arrival date & time 05/29/12  1245   None     Chief Complaint  Patient presents with  . Chest Pain    (Consider location/radiation/quality/duration/timing/severity/associated sxs/prior treatment) HPI Comments: Is an 78 year old woman who felt like a knot in her chest last night and vomited once. It seemed like indigestion. She may have tried some antacid but it's unclear if she took and acid for this. She did not take nitroglycerin. She has a history of atrial fibrillation, and it had cardioversion in 2012. History of congestive failure, diabetes, and hypertension.  She was given NTG and Zofran by EMS and now is asymptomatic.  Patient is a 78 y.o. female presenting with chest pain. The history is provided by the patient, a relative and medical records. No language interpreter was used.  Chest Pain The chest pain began yesterday. Chest pain occurs intermittently (Vague history of chest pain, like a knot in the chest, onset last night.). The chest pain is resolved. Associated with: Nothing. The severity of the pain is moderate. Quality: Like a "knot" and like indigestion. The pain does not radiate. Pertinent negatives for primary symptoms include no fever, no syncope, no shortness of breath and no cough. She tried nitroglycerin (Antiemetics) for the symptoms. Risk factors include being elderly and sedentary lifestyle.  Her past medical history is significant for diabetes and hypertension. Past medical history comments: Atrial fibrillation Procedure history comments: Cardioversion.     Past Medical History  Diagnosis Date  . Diabetes mellitus   . CHF (congestive heart failure)   . Hypertension   . Atrial fibrillation     No past surgical history on file.  No family history on file.  History  Substance Use Topics  . Smoking status: Not on file  . Smokeless tobacco: Not on file  . Alcohol Use:     OB History    Grav Para Term Preterm Abortions TAB  SAB Ect Mult Living                  Review of Systems  Constitutional: Negative for fever.  HENT: Negative.   Eyes: Negative.   Respiratory: Negative for cough and shortness of breath.   Cardiovascular: Positive for chest pain. Negative for syncope.  Gastrointestinal:       Feeling of indigestion.  Genitourinary: Negative.   Musculoskeletal: Negative.   Skin: Negative.   Neurological: Negative.   Psychiatric/Behavioral: Negative.     Allergies  Aspirin; Nexium; and Penicillins  Home Medications  No current outpatient prescriptions on file.  BP 116/55  Pulse 92  Temp 98.2 F (36.8 C) (Oral)  Resp 24  SpO2 100%  Physical Exam  Nursing note and vitals reviewed. Constitutional: She is oriented to person, place, and time. She appears well-developed and well-nourished. No distress.  HENT:  Head: Normocephalic and atraumatic.  Right Ear: External ear normal.  Left Ear: External ear normal.  Mouth/Throat: Oropharynx is clear and moist.  Eyes: Conjunctivae and EOM are normal. Pupils are equal, round, and reactive to light.  Neck: Normal range of motion. Neck supple.  Cardiovascular: Normal rate, regular rhythm and normal heart sounds.   Pulmonary/Chest: Effort normal and breath sounds normal.  Abdominal: Soft. Bowel sounds are normal. She exhibits no distension. There is no tenderness. There is no rebound.  Musculoskeletal: Normal range of motion. She exhibits no edema and no tenderness.  Neurological: She is alert and oriented to person, place, and time.  No sensory or motor deficit.  Skin: Skin is warm and dry.  Psychiatric: She has a normal mood and affect. Her behavior is normal.    ED Course  Procedures (including critical care time)  1:07 PM  Date: 05/29/2012  Rate: 92  Rhythm: atrial fibrillation  QRS Axis: normal  Intervals: QT prolonged QRS:  Q waves in II, III, and AVF suggest old inferior myocardial infarction.  ST/T Wave abnormalities: normal   Conduction Disutrbances:right bundle branch block  Narrative Interpretation: Abnormal EKG  Old EKG Reviewed: unchanged   4:36 PM Results for orders placed during the hospital encounter of 05/29/12  COMPREHENSIVE METABOLIC PANEL      Component Value Range   Sodium 131 (*) 135 - 145 mEq/L   Potassium 2.9 (*) 3.5 - 5.1 mEq/L   Chloride 90 (*) 96 - 112 mEq/L   CO2 31  19 - 32 mEq/L   Glucose, Bld 108 (*) 70 - 99 mg/dL   BUN 18  6 - 23 mg/dL   Creatinine, Ser 0.98  0.50 - 1.10 mg/dL   Calcium 8.1 (*) 8.4 - 10.5 mg/dL   Total Protein 5.4 (*) 6.0 - 8.3 g/dL   Albumin 2.2 (*) 3.5 - 5.2 g/dL   AST 36  0 - 37 U/L   ALT 17  0 - 35 U/L   Alkaline Phosphatase 86  39 - 117 U/L   Total Bilirubin 0.5  0.3 - 1.2 mg/dL   GFR calc non Af Amer 47 (*) >90 mL/min   GFR calc Af Amer 55 (*) >90 mL/min  PROTIME-INR      Component Value Range   Prothrombin Time 17.7 (*) 11.6 - 15.2 seconds   INR 1.43  0.00 - 1.49  APTT      Component Value Range   aPTT 42 (*) 24 - 37 seconds  URINALYSIS, ROUTINE W REFLEX MICROSCOPIC      Component Value Range   Color, Urine YELLOW  YELLOW   APPearance CLOUDY (*) CLEAR   Specific Gravity, Urine 1.008  1.005 - 1.030   pH 6.0  5.0 - 8.0   Glucose, UA NEGATIVE  NEGATIVE mg/dL   Hgb urine dipstick NEGATIVE  NEGATIVE   Bilirubin Urine NEGATIVE  NEGATIVE   Ketones, ur NEGATIVE  NEGATIVE mg/dL   Protein, ur NEGATIVE  NEGATIVE mg/dL   Urobilinogen, UA 0.2  0.0 - 1.0 mg/dL   Nitrite NEGATIVE  NEGATIVE   Leukocytes, UA SMALL (*) NEGATIVE  CBC      Component Value Range   WBC 11.7 (*) 4.0 - 10.5 K/uL   RBC 3.67 (*) 3.87 - 5.11 MIL/uL   Hemoglobin 12.3  12.0 - 15.0 g/dL   HCT 11.9 (*) 14.7 - 82.9 %   MCV 94.6  78.0 - 100.0 fL   MCH 33.5  26.0 - 34.0 pg   MCHC 35.4  30.0 - 36.0 g/dL   RDW 56.2  13.0 - 86.5 %   Platelets 262  150 - 400 K/uL  POCT I-STAT TROPONIN I      Component Value Range   Troponin i, poc 0.01  0.00 - 0.08 ng/mL   Comment 3           URINE  MICROSCOPIC-ADD ON      Component Value Range   Squamous Epithelial / LPF MANY (*) RARE   WBC, UA 11-20  <3 WBC/hpf   RBC / HPF 0-2  <3 RBC/hpf   Bacteria, UA MANY (*) RARE  Dg Chest Portable 1 View  05/29/2012  *RADIOLOGY REPORT*  Clinical Data:  Chest pain  PORTABLE CHEST - 1 VIEW  Comparison: Most recent prior chest x-ray 12/16/2010, CT scan of the abdomen and pelvis including the lung bases 05/21/2012  Findings: Persistent, but improved cardiomegaly compared to the prior chest x-ray.  Atherosclerotic calcification of the transverse aorta again noted.  Diffuse calcification of the mitral valve annulus.  The lungs are negative for pulmonary edema, or focal airspace consolidation.  Streaky left retrocardiac opacities are favored to reflect atelectasis.  No pulmonary nodule.  No acute osseous finding.  IMPRESSION:  1.  No acute cardiopulmonary disease. 2.  Persistent, but improved cardiomegaly compared to 12/16/2010 3.  Prominent calcification of the mitral valve annulus 4.  Atherosclerosis  Original Report Authenticated By: Vilma Prader   Lab workup shows a UTI.  Pt remains asymptomatic.  Call to Dr. Pete Glatter --> case discussed with Dr. Donette Larry, covering, who referred me to [facility] to admit pt.  1. Chest pain   2. Urinary tract infection   3. Atrial fibrillation   4. CHF (congestive heart failure)   5. Thyroid disease   6. Asthma         Carleene Cooper III, MD 05/29/12 2006

## 2012-05-29 NOTE — ED Notes (Signed)
Per ems- pt c/o "indigestion-like pain" in center of chest since 6pm last night. Pain felt like "a knot" in her chest this am and had 1 episode of vomiting. Pt received 4mg  zofran and 2nitro which did alleviate her symptoms. Pt denies any CP currently. Denies any sob, dizziness, or any other associated sx. Pt has 22g IV R hand. HR- 80 (afib) BP- 97/58 R-18

## 2012-05-29 NOTE — ED Notes (Signed)
Pt states pain was non-radiating, worse with movement. Pt denies any chest pain currently.

## 2012-05-29 NOTE — H&P (Signed)
[facility] History and Physical  [name] ZDG:[id number] DOB: 1924-10-20 DOA: 05/29/2012  Referring physician: Dr. Ignacia Palma, emergency room physician PCP: Ginette Otto, MD   Chief Complaint: Chest pain  HPI:  Patient is an 78 year old white female past medical history of systolic heart failure as well as atrial fibrillation and diabetes who woke up this morning complaining of midepigastric pain. She described it as feeling very much like indigestion and felt better when she burped. Her symptoms persisted so she came into the emergency room to be evaluated. In the emergency room, she was noted to have a mild leukocytosis and a urinary tract infection. She was also noted to have an elevated BNP of 1700. Patient was given Cipro for her UTI and hospitals were called for admission. By the time she left the emergency room, her pain was much better. Some nitroglycerin truly helped her pain. EKG noted chronic atrial fibrillation and cardiac markers were negative.  Review of Systems:  I saw the patient in the emergency room, she was doing okay, she complained of some mild upper bowel discomfort (she has a resolving colitis) she denied any headaches, vision changes, dysphasia, chest pain, palpitations, shortness of breath, wheeze, cough,, hematuria, dysuria, constipation, diarrhea, focal extremity numbness or weakness or pain. Review systems are otherwise negative  Past Medical History  Diagnosis Date  . CHF (congestive heart failure)   . Hypertension   . Atrial fibrillation   . Peripheral neuropathy   . Asthma   . High cholesterol   . Type II diabetes mellitus   . PAD (peripheral artery disease)   . Enlarged heart   . Anginal pain 05/28/12  . Pneumonia 11/2010  . Sleep apnea   . Hypothyroidism   . Headache 05/29/12    "recently"  . Stroke 05/2005; 08/2010    !mini"  . Chronic kidney disease     "something on labs always say she has some problems"  . Personal history of  gout    Past Surgical History  Procedure Date  . Tonsillectomy and adenoidectomy 1960's  . Dilation and curettage of uterus   . Vaginal hysterectomy 1970's  . Total knee arthroplasty 1970-~2002    left; right  . Cataract extraction w/ intraocular lens  implant, bilateral ?1970's   Social History:  reports that she has never smoked. She has never used smokeless tobacco. She reports that she does not drink alcohol or use illicit drugs. Patient lives at home with family. She normally is able to ambulate with use of a walker without any difficulty.  Allergies  Allergen Reactions  . Aspirin Other (See Comments)    Tears my stomach up; "can take coated Aspirin qd without problems"  . Nexium (Esomeprazole Magnesium) Rash  . Penicillins Rash    Family history: Hypertension and diabetes  Prior to Admission medications   Medication Sig Start Date End Date Taking? Authorizing Provider  allopurinol (ZYLOPRIM) 300 MG tablet Take 300 mg by mouth daily.   Yes Historical Provider, MD  aspirin EC 81 MG tablet Take 81 mg by mouth daily.   Yes Historical Provider, MD  atorvastatin (LIPITOR) 10 MG tablet Take 10 mg by mouth daily.   Yes Historical Provider, MD  Calcium Carb-Cholecalciferol (CALCIUM + D3) 600-200 MG-UNIT TABS Take 1 tablet by mouth daily.   Yes Historical Provider, MD  dabigatran (PRADAXA) 75 MG CAPS Take 75 mg by mouth every 12 (twelve) hours.   Yes Historical Provider, MD  diltiazem (TIAZAC) 240 MG 24 hr  capsule Take 240 mg by mouth daily.   Yes Historical Provider, MD  furosemide (LASIX) 80 MG tablet Take 80 mg by mouth daily.   Yes Historical Provider, MD  gabapentin (NEURONTIN) 300 MG capsule Take 300 mg by mouth at bedtime.   Yes Historical Provider, MD  Garlic (GARLIQUE) 400 MG TBEC Take 1 tablet by mouth daily.   Yes Historical Provider, MD  glipiZIDE (GLUCOTROL XL) 5 MG 24 hr tablet Take 5 mg by mouth daily.   Yes Historical Provider, MD  levofloxacin (LEVAQUIN) 500 MG tablet  Take 500 mg by mouth daily. Started 05/17/12   Yes Historical Provider, MD  levothyroxine (SYNTHROID, LEVOTHROID) 88 MCG tablet Take 88 mcg by mouth daily.   Yes Historical Provider, MD  Multiple Vitamins-Minerals (MULTIVITAMIN WITH MINERALS) tablet Take 1 tablet by mouth daily.   Yes Historical Provider, MD  Omega-3 Fatty Acids (FISH OIL) 1000 MG CAPS Take 1 capsule by mouth daily.   Yes Historical Provider, MD  omeprazole (PRILOSEC) 20 MG capsule Take 20 mg by mouth daily.   Yes Historical Provider, MD  potassium chloride (KLOR-CON) 8 MEQ tablet Take 8 mEq by mouth 2 (two) times daily. For 7 days. Started 05/28/12   Yes Historical Provider, MD  PRESCRIPTION MEDICATION Take 1 tablet by mouth 2 (two) times daily. Methylfolate/Calcium   Yes Historical Provider, MD  Probiotic Product (PROBIOTIC DAILY PO) Take 1 tablet by mouth daily.   Yes Historical Provider, MD  vitamin C (ASCORBIC ACID) 500 MG tablet Take 500 mg by mouth daily.   Yes Historical Provider, MD  zaleplon (SONATA) 5 MG capsule Take 5 mg by mouth at bedtime.   Yes Historical Provider, MD   Physical Exam: Filed Vitals:   05/29/12 1530 05/29/12 1600 05/29/12 1700 05/29/12 1855  BP: 105/47 108/54 108/48 110/54  Pulse: 81 79 71 78  Temp:    97.9 F (36.6 C)  TempSrc:    Oral  Resp: 26 22 21 18   SpO2: 98% 100% 97% 94%     General:  Alert and oriented x3, no acute distress, fatigued  Eyes: Sclera nonicteric  ENT: Normocephalic, atraumatic, mucous membranes are dry  Neck: Supple, no thyromegaly  Cardiovascular: Irregular rhythm, rate controlled  Respiratory: Clear to auscultation bilaterally  Abdomen: Soft, minimal tenderness in the midepigastric area, nondistended, hypoactive bowel sounds  Skin: No tears or lesions or rashes  Musculoskeletal: No clubbing or cyanosis, trace pitting edema  Psychiatric: Appropriate no evidence of psychoses  Neurologic: No focal neurological deficits  Labs on Admission:  Basic Metabolic  Panel:  Lab 05/29/12 1340  NA 131*  K 2.9*  CL 90*  CO2 31  GLUCOSE 108*  BUN 18  CREATININE 1.05  CALCIUM 8.1*  MG --  PHOS --   Liver Function Tests:  Lab 05/29/12 1340  AST 36  ALT 17  ALKPHOS 86  BILITOT 0.5  PROT 5.4*  ALBUMIN 2.2*   CBC:  Lab 05/29/12 1436  WBC 11.7*  NEUTROABS --  HGB 12.3  HCT 34.7*  MCV 94.6  PLT 262   Cardiac Enzymes:  Lab 05/29/12 1711  CKTOTAL 47  CKMB 3.5  CKMBINDEX --  TROPONINI <0.30    BNP (last 3 results)  Basename 05/29/12 1711  PROBNP 1621.0*   CBG: No results found for this basename: GLUCAP:5 in the last 168 hours  Radiological Exams on Admission: Dg Chest Portable 1 View  05/29/2012   IMPRESSION:  1.  No acute cardiopulmonary disease. 2.  Persistent,  but improved cardiomegaly compared to 12/16/2010 3.  Prominent calcification of the mitral valve annulus 4.  Atherosclerosis  Original Report Authenticated By: Alvino Blood Abd 2 Views  05/29/2012    IMPRESSION: Resolved small bowel obstruction.  No evidence of free intraperitoneal air, pneumatosis, or other acute complication.  Possible constipation.  Exclusion of the pelvis from both the upright and supine images.  Original Report Authenticated By: Consuello Bossier, M.D.    EKG: Independently reviewed.  atrial fibrillation with right bundle branch block   Assessment/Plan Principal Problem:  *UTI (lower urinary tract infection): Cipro. Patient has an allergy to penicillin so cannot use Rocephin.  Active Problems:  Chest pain: Looks to be indigestion. Cycle enzymes and keep on telemetry   Vomiting: Stable.   Constipation  Atrial fibrillation: Rate control. Patient is on Pradaxa   CHF (congestive heart failure): Principal problem. We'll diurese with IV Lasix. Follow intake and output   Thyroid disease: Stable.   Hypokalemia: Will replace especially given Lasix   Hyponatremia: Secondary dehydration. Recheck labs in the morning. 1.   Code Status:  DO NOT  RESUSCITATE  Family Communication:  family at bedside  Disposition Plan:  hopefully we'll go home next few days  Time spent:  45 minutes  Hollice Espy [facility] Pager [phone]   If 7PM-7AM, please contact night-coverage [email or web address] Password [email or web address] 05/29/2012, 7:10 PM

## 2012-05-30 ENCOUNTER — Inpatient Hospital Stay (HOSPITAL_COMMUNITY): Payer: Medicare Other

## 2012-05-30 DIAGNOSIS — K559 Vascular disorder of intestine, unspecified: Secondary | ICD-10-CM

## 2012-05-30 DIAGNOSIS — E876 Hypokalemia: Secondary | ICD-10-CM

## 2012-05-30 HISTORY — DX: Vascular disorder of intestine, unspecified: K55.9

## 2012-05-30 LAB — GLUCOSE, CAPILLARY
Glucose-Capillary: 73 mg/dL (ref 70–99)
Glucose-Capillary: 129 mg/dL — ABNORMAL HIGH (ref 70–99)
Glucose-Capillary: 131 mg/dL — ABNORMAL HIGH (ref 70–99)
Glucose-Capillary: 346 mg/dL — ABNORMAL HIGH (ref 70–99)

## 2012-05-30 LAB — CARDIAC PANEL(CRET KIN+CKTOT+MB+TROPI)
CK, MB: 3.1 ng/mL (ref 0.3–4.0)
CK, MB: 3.2 ng/mL (ref 0.3–4.0)
CK, MB: 2.5 ng/mL (ref 0.3–4.0)
Relative Index: INVALID (ref 0.0–2.5)
Relative Index: INVALID (ref 0.0–2.5)
Total CK: 47 U/L (ref 7–177)
Troponin I: <0.3 ng/mL (ref <0.30)
Troponin I: <0.3 ng/mL (ref <0.30)

## 2012-05-30 LAB — BASIC METABOLIC PANEL
BUN: 17 mg/dL (ref 6–23)
CO2: 32 mEq/L (ref 19–32)
Calcium: 7.4 mg/dL — ABNORMAL LOW (ref 8.4–10.5)
Chloride: 93 mEq/L — ABNORMAL LOW (ref 96–112)
Chloride: 91 mEq/L — ABNORMAL LOW (ref 96–112)
GFR calc Af Amer: 61 mL/min — ABNORMAL LOW (ref >90)
GFR calc non Af Amer: 52 mL/min — ABNORMAL LOW (ref >90)
Glucose, Bld: 115 mg/dL — ABNORMAL HIGH (ref 70–99)
Glucose, Bld: 128 mg/dL — ABNORMAL HIGH (ref 70–99)
Potassium: 2.8 mEq/L — ABNORMAL LOW (ref 3.5–5.1)
Potassium: 3.3 mEq/L — ABNORMAL LOW (ref 3.5–5.1)
Sodium: 133 mEq/L — ABNORMAL LOW (ref 135–145)
Sodium: 132 mEq/L — ABNORMAL LOW (ref 135–145)

## 2012-05-30 LAB — CBC
Hemoglobin: 13.3 g/dL (ref 12.0–15.0)
MCV: 96.7 fL (ref 78.0–100.0)
Platelets: 265 10*3/uL (ref 150–400)
RBC: 3.97 MIL/uL (ref 3.87–5.11)
WBC: 30.6 10*3/uL — ABNORMAL HIGH (ref 4.0–10.5)

## 2012-05-30 LAB — MAGNESIUM: Magnesium: 1.4 mg/dL — ABNORMAL LOW (ref 1.5–2.5)

## 2012-05-30 LAB — LACTIC ACID, PLASMA: Lactic Acid, Venous: 4.4 mmol/L — ABNORMAL HIGH (ref 0.5–2.2)

## 2012-05-30 MED ORDER — INSULIN ASPART 100 UNIT/ML ~~LOC~~ SOLN
0.0000 [IU] | SUBCUTANEOUS | Status: DC
  Administered 2012-05-30: 5 [IU] via SUBCUTANEOUS
  Administered 2012-05-31 – 2012-06-01 (×7): 2 [IU] via SUBCUTANEOUS
  Administered 2012-06-01: 1 [IU] via SUBCUTANEOUS
  Administered 2012-06-01: 2 [IU] via SUBCUTANEOUS
  Administered 2012-06-02: 1 [IU] via SUBCUTANEOUS
  Administered 2012-06-02: 3 [IU] via SUBCUTANEOUS

## 2012-05-30 MED ORDER — AMIODARONE HCL IN DEXTROSE 360-4.14 MG/200ML-% IV SOLN
1.0000 mg/min | INTRAVENOUS | Status: AC
  Administered 2012-05-31: 1 mg/min via INTRAVENOUS
  Filled 2012-05-30 (×2): qty 200

## 2012-05-30 MED ORDER — METRONIDAZOLE IN NACL 5-0.79 MG/ML-% IV SOLN
500.0000 mg | Freq: Three times a day (TID) | INTRAVENOUS | Status: DC
  Administered 2012-05-31 – 2012-06-01 (×5): 500 mg via INTRAVENOUS
  Filled 2012-05-30 (×8): qty 100

## 2012-05-30 MED ORDER — PRAMOXINE HCL 1 % RE OINT
TOPICAL_OINTMENT | Freq: Three times a day (TID) | RECTAL | Status: DC | PRN
  Filled 2012-05-30: qty 30

## 2012-05-30 MED ORDER — SODIUM CHLORIDE 0.9 % IV BOLUS (SEPSIS)
500.0000 mL | Freq: Once | INTRAVENOUS | Status: AC
  Administered 2012-05-30: 500 mL via INTRAVENOUS

## 2012-05-30 MED ORDER — ALUM & MAG HYDROXIDE-SIMETH 200-200-20 MG/5ML PO SUSP: 30.0000 mL | ORAL | Status: DC | PRN

## 2012-05-30 MED ORDER — SENNOSIDES-DOCUSATE SODIUM 8.6-50 MG PO TABS
2.0000 | ORAL_TABLET | Freq: Once | ORAL | Status: AC
  Administered 2012-05-30: 2 via ORAL
  Filled 2012-05-30: qty 2

## 2012-05-30 MED ORDER — MAGNESIUM SULFATE IN D5W 10-5 MG/ML-% IV SOLN
1.0000 g | Freq: Once | INTRAVENOUS | Status: AC
  Administered 2012-05-30: 1 g via INTRAVENOUS
  Filled 2012-05-30: qty 100

## 2012-05-30 MED ORDER — AMIODARONE LOAD VIA INFUSION
150.0000 mg | Freq: Once | INTRAVENOUS | Status: AC
  Administered 2012-05-30: 150 mg via INTRAVENOUS
  Filled 2012-05-30: qty 83.34

## 2012-05-30 MED ORDER — POTASSIUM CHLORIDE 10 MEQ/100ML IV SOLN
10.0000 meq | INTRAVENOUS | Status: AC
  Administered 2012-05-30 (×3): 10 meq via INTRAVENOUS
  Filled 2012-05-30 (×3): qty 100

## 2012-05-30 MED ORDER — CIPROFLOXACIN IN D5W 400 MG/200ML IV SOLN
400.0000 mg | Freq: Two times a day (BID) | INTRAVENOUS | Status: DC
  Filled 2012-05-30 (×2): qty 200

## 2012-05-30 MED ORDER — SODIUM CHLORIDE 0.9 % IV SOLN: INTRAVENOUS | Status: DC

## 2012-05-30 MED ORDER — INSULIN ASPART 100 UNIT/ML ~~LOC~~ SOLN: 0.0000 [IU] | SUBCUTANEOUS | Status: DC

## 2012-05-30 MED ORDER — SODIUM CHLORIDE 0.9 % IV BOLUS (SEPSIS)
250.0000 mL | Freq: Once | INTRAVENOUS | Status: AC
  Administered 2012-05-30: 23:00:00 via INTRAVENOUS

## 2012-05-30 MED ORDER — WITCH HAZEL-GLYCERIN EX PADS
MEDICATED_PAD | CUTANEOUS | Status: DC | PRN
  Filled 2012-05-30: qty 100

## 2012-05-30 MED ORDER — FAMOTIDINE 20 MG PO TABS
20.0000 mg | ORAL_TABLET | Freq: Every day | ORAL | Status: DC
  Administered 2012-05-30 – 2012-06-05 (×7): 20 mg via ORAL
  Filled 2012-05-30 (×9): qty 1

## 2012-05-30 MED ORDER — POTASSIUM CHLORIDE CRYS ER 20 MEQ PO TBCR
30.0000 meq | EXTENDED_RELEASE_TABLET | Freq: Two times a day (BID) | ORAL | Status: DC
  Administered 2012-05-30 – 2012-06-01 (×5): 30 meq via ORAL
  Filled 2012-05-30 (×11): qty 1

## 2012-05-30 MED ORDER — AMIODARONE HCL IN DEXTROSE 360-4.14 MG/200ML-% IV SOLN
0.5000 mg/min | INTRAVENOUS | Status: DC
  Administered 2012-05-31 – 2012-06-03 (×6): 0.5 mg/min via INTRAVENOUS
  Filled 2012-05-30 (×15): qty 200

## 2012-05-30 MED ORDER — DILTIAZEM HCL 100 MG IV SOLR
5.0000 mg/h | INTRAVENOUS | Status: DC
  Filled 2012-05-30: qty 100

## 2012-05-30 MED ORDER — ENSURE PUDDING PO PUDG
1.0000 | Freq: Two times a day (BID) | ORAL | Status: DC
  Administered 2012-05-30 – 2012-06-06 (×6): 1 via ORAL

## 2012-05-30 MED ORDER — POLYETHYLENE GLYCOL 3350 17 G PO PACK
17.0000 g | PACK | Freq: Every day | ORAL | Status: DC
Start: 2012-05-30 — End: 2012-05-30
  Administered 2012-05-30: 17 g via ORAL
  Filled 2012-05-30: qty 1

## 2012-05-30 NOTE — Progress Notes (Signed)
Patient seen, examined and discussed with my PA. She's feeling rough this afternoon complaining of some diarrhea and hemorrhoidal pain secondary. She is also having some mild abdominal gaseous distention. Is uncomfortable. No chest pain or breathing issues.

## 2012-05-30 NOTE — Progress Notes (Signed)
Patients v/s assessed. Patient's heart rate 158. B/P 90/50 in right arm. Spo2 84% on ra, patient placed on 2l n/c, SPo2 98 % once placed on 2l. MD notified.   Leonie Green [phone].

## 2012-05-30 NOTE — Progress Notes (Signed)
[name] [id number] 07/05/1924   Brief narrative: Called by nursing staff for pt's HR 150's-160's, SBP 90's, BRBPR starting this evening with subsequent abdominal pain. Of note, pt treated just over one week ago (outpt) for suspected mesenteric ischemia with subsequent sbo. She was admitted on 7/31 with complaints of "epigastric pain". She has been ruled out for ACS. Plain flim was negative for sbo.   Subjective: Complaining of generalized abdominal pain and rectal bleeding "because of hemorrhoids" though pain is worse with movement. She reports bleeding has been intermittent for 2 weeks. She denies any current chest pain, sob, nausea or vomiting. Diarrhea this afternoon after dose of miralax.  Objective: Filed Vitals:   05/30/12 0644 05/30/12 0823 05/30/12 1440 05/30/12 1829  BP: 109/63 98/54 116/63 92/47  Pulse: 90 75 86 77  Temp: 98.4 F (36.9 C)  97.5 F (36.4 C)   TempSrc: Oral  Oral   Resp: 18  18   Height:      Weight:      SpO2: 93%  98%     CBC  CBC    Component Value Date/Time   WBC 30.6* 05/30/2012 2101   RBC 3.97 05/30/2012 2101   HGB 13.3 05/30/2012 2101   HCT 38.4 05/30/2012 2101   PLT 265 05/30/2012 2101   MCV 96.7 05/30/2012 2101   MCH 33.5 05/30/2012 2101   MCHC 34.6 05/30/2012 2101   RDW 14.9 05/30/2012 2101   LYMPHSABS 1.7 12/10/2010 1928   MONOABS 1.4* 12/10/2010 1928   EOSABS 0.0 12/10/2010 1928   BASOSABS 0.0 12/10/2010 1928        Component Value Date/Time   WBC 11.7* 05/29/2012 1436   RBC 3.67* 05/29/2012 1436   HGB 12.3 05/29/2012 1436   HCT 34.7* 05/29/2012 1436   PLT 262 05/29/2012 1436   MCV 94.6 05/29/2012 1436   MCH 33.5 05/29/2012 1436   MCHC 35.4 05/29/2012 1436   RDW 14.4 05/29/2012 1436   LYMPHSABS 1.7 12/10/2010 1928   MONOABS 1.4* 12/10/2010 1928   EOSABS 0.0 12/10/2010 1928   BASOSABS 0.0 12/10/2010 1928    BMET    Component Value Date/Time   NA 132* 05/30/2012 1124   K 3.3* 05/30/2012 1124   CL 91* 05/30/2012 1124   CO2 32 05/30/2012 1124   GLUCOSE 128* 05/30/2012 1124   BUN 14 05/30/2012 1124   CREATININE 0.97 05/30/2012 1124   CALCIUM 7.4* 05/30/2012 1124   GFRNONAA 52* 05/30/2012 1124   GFRAA 60* 05/30/2012 1124   General: awake, alert lying supine in bed in NAD CV: irregularly irregular with increased rate Resp: decreased inspiratory effort but CTAB, no w/r/c, no increased wob GI: abdomen is soft, ND, tenderness on palpation of upper and lower left abdomen without rebound or guarding.  Neuro: no focal m/s deficits on exam Psych: aox3, very pleasant  Assessment/Plan:  1. Afib with RVR: Pt unable to tolerate Cardizem gtt at this time given hypotension. Discussed case with cardiology, Dr. Shirlee Latch. Will give NS bolus to determine response. Start amiodarone gtt. Hold pradaxa for now in setting of rectal bleeding.  2. Abdominal pain/BRBPR: Especially concerning given WBC 30.6<--11.7 per stat labs. Will need CT abd/pelvis when stabilized. For now, will obtain bedside KUB to assess for any free air. Hgb 13.3<--12.3 to less worrisome for frank bleed. ?hemorrhoidal vs ischemic vs other. Continue to monitor. NPO. May necessitate GI consult.  3. Hypotension: Again, worrisome with sudden onset of abdominal pain. ? Early sepsis.Transfer to SDU. Upon discussion with  pt and son, pt does wish for use of vasopressors if indicated  4. Leukocytosis, profound: WBC 30.6<--11.7. Will need CT abdomen/pelvis when stabilized. For now, check KUB r/o perforation. Check lactic acid, cdiff. Will change cipro IV, add flagyl.   5. CHF:  Requiring IV diuresis this admit. Monitor closely with fluid challenges.  6. Code status: Readdressed with pt and son. Pt remains DNR with NO CPR or intubation. Ok for BP support if indicated.  Cordelia Pen, NP-C [facility] Service [facility]  pgr [phone]

## 2012-05-30 NOTE — Care Management Note (Signed)
    Page 1 of 2   06/06/2012     11:12:18 PM   CARE MANAGEMENT NOTE 06/06/2012  Patient:  [name], [name]   Account Number:  [id number]  Date Initiated:  05/30/2012  Documentation initiated by:  SIMMONS,CRYSTAL  Subjective/Objective Assessment:   ADMITTED WITH UTI; LIVES AT HOME ALONE- HAS 24HRS SUPERVISION PROVIDED BY [employer]; USES RW AND CPAP AT HOME; USES [facility] MAIL ORDER FOR RX.     Action/Plan:   DISCHARGE PLANNING DISCUSSED AT BEDSIDE.   Anticipated DC Date:  05/31/2012   Anticipated DC Plan:  HOME W HOME HEALTH SERVICES      DC Planning Services  CM consult      [facility] Choice  Resumption Of Svcs/PTA Provider   Choice offered to / List presented to:             Status of service:  Completed, signed off Medicare Important Message given?   (If response is "NO", the following Medicare IM given date fields will be blank) Date Medicare IM given:   Date Additional Medicare IM given:    Discharge Disposition:  IP REHAB FACILITY  Per UR Regulation:  Reviewed for med. necessity/level of care/duration of stay  If discussed at Long Length of Stay Meetings, dates discussed:   06/04/2012  06/06/2012    Comments:  PCP:  Dr. Merlene Laughter  06/06/12- 1300- Donn Pierini RN, BSN [phone] Pt to d/c to CIR today  06/03/12 0935 Henrietta Mayo RN MSN CCM Pt still on neosynephrine drip and IV antibx.  05/30/12  1048  CRYSTAL SIMMONS RN, BSN [phone] POA- [facility] (D[facility]) H- [phone]/  C- [phone]; HAS HAD [facility] IN PAST FOR HHPT ([facility]) AND WOULD LIKE AGAIN;  NCM WILL FOLLOW.

## 2012-05-30 NOTE — Progress Notes (Signed)
Patient ID: [name], female   DOB: 02/07/1924, 78 y.o.   MRN: [id number] [facility] PROGRESS NOTE  [name] WUJ:[id number] DOB: 03-02-1924 DOA: 05/29/2012 PCP: Ginette Otto, MD  Assessment/Plan: Principal Problem:  *UTI (lower urinary tract infection) Active Problems:  Chest pain  Constipation  Vomiting  Atrial fibrillation  CHF (congestive heart failure)  Thyroid disease  Hypokalemia  Hyponatremia   Mixed CHF exacerbation with volume overload.  Patient has received IV lasix diuresis and is feeling much improved.  Would consider updating echo as in patient or out patient. Low sodium diet, daily weights, I&Os,  Patient has received CHF education.    Chest Pain.  Resolved.  Appears to be indigestion.  Added pepcid (she has a PPI allergy).  We also discussed the cholelithiasis on her CT scan.  If she continues to have difficulty she will follow with Dr. Pete Glatter.  Urinary Tract Infection.  On cipro.  Awaiting cultures.  Constipation on xray.  Added Senna, Miralax  Hyponatremia.  Improving with diuresis.  Secondary to CHF.  Hypokalemia.  Being repleted.  Mag repleted.  Being exacerbated by diuresis.  bmet in am.   Code Status: Full Code Family Communication: Family at bedside Disposition Plan: Has 24 hour assistance at home.  May benefit from  home PT/OT/RN  Brief narrative: Patient is an 78 year old white female past medical history of systolic heart failure as well as atrial fibrillation and diabetes who woke up this morning complaining of midepigastric pain. She described it as feeling very much like indigestion and felt better when she burped. Her symptoms persisted so she came into the emergency room to be evaluated. In the emergency room, she was noted to have a mild leukocytosis and a urinary tract infection. She was also noted to have an elevated BNP of 1700. Patient was given Cipro for her UTI and hospitals were called for admission. By the  time she left the emergency room, her pain was much better. Some nitroglycerin truly helped her pain. EKG noted chronic atrial fibrillation and cardiac markers were negative.  Antibiotics:  Cipro  HPI/Subjective: I'm feeling much better than when I came in, I just need some sleep.  Objective: Filed Vitals:   05/29/12 2111 05/30/12 0158 05/30/12 0644 05/30/12 0823  BP: 113/45  109/63 98/54  Pulse: 74  90 75  Temp: 97.7 F (36.5 C)  98.4 F (36.9 C)   TempSrc: Oral  Oral   Resp: 18  18   Height:  5\' 7"  (1.702 m)    Weight:  76 kg (167 lb 8.8 oz)    SpO2: 99%  93%     Intake/Output Summary (Last 24 hours) at 05/30/12 1128 Last data filed at 05/30/12 0900  Gross per 24 hour  Intake   3155 ml  Output   2250 ml  Net    905 ml    Exam:   General:  A&O, NAD, very pleasant.  Family at bedside  Cardiovascular: RRR, No M/R/G, no Lower Extremity Edema  Respiratory: CTA, No increased work of breathing  Abdomen: Soft, NT, ND, positive bowel sounds, no obvious masses  Neurological:  Cranial Nerves 2 - 12 are grossly intact, exam non focal  Psychiatric  Skin:  No rashes, bruises, or lesions  Data Reviewed: Basic Metabolic Panel:  Lab 05/30/12 9562 05/29/12 1340  NA 133* 131*  K 2.8* 2.9*  CL 93* 90*  CO2 32 31  GLUCOSE 115* 108*  BUN 17 18  CREATININE 0.96 1.05  CALCIUM 7.2* 8.1*  MG 1.4* --  PHOS -- --   Liver Function Tests:  Lab 05/29/12 1340  AST 36  ALT 17  ALKPHOS 86  BILITOT 0.5  PROT 5.4*  ALBUMIN 2.2*   CBC:  Lab 05/29/12 1436  WBC 11.7*  NEUTROABS --  HGB 12.3  HCT 34.7*  MCV 94.6  PLT 262   Cardiac Enzymes:  Lab 05/30/12 0853 05/30/12 0133 05/29/12 1711  CKTOTAL 50 47 47  CKMB 3.2 3.1 3.5  CKMBINDEX -- -- --  TROPONINI <0.30 <0.30 <0.30   BNP (last 3 results)  Basename 05/29/12 1711  PROBNP 1621.0*   CBG:  Lab 05/30/12 0643 05/29/12 2110  GLUCAP 73 93      Studies: Ct Abdomen Pelvis W Contrast  05/21/2012  .   IMPRESSION:  1.  Abnormal study demonstrates distal small bowel distension with mild wall thickening and surrounding inflammatory change.  Findings are suggestive of possible mesenteric ischemia with resulting partial small bowel obstruction.  The presence of a "small bowel feces sign" implies some chronicity to the findings. 2.  No evidence of bowel perforation or pneumatosis. 3.  Extensive atherosclerosis. 4.  Right suprapubic hernia containing mildly inflamed fat.  No herniated bowel. 5.  Cholelithiasis with possible gallbladder wall calcification.  These results were called by telephone on 05/21/2012 at 1625 hours to Dr. Pete Glatter, who verbally acknowledged these results.  Original Report Authenticated By: Gerrianne Scale, M.D.   Dg Chest Portable 1 View  05/29/2012  *RADIOLOGY REPORT*  Clinical Data:  Chest pain  PORTABLE CHEST - 1 VIEW  Comparison: Most recent prior chest x-ray 12/16/2010, CT scan of the abdomen and pelvis including the lung bases 05/21/2012  Findings: Persistent, but improved cardiomegaly compared to the prior chest x-ray.  Atherosclerotic calcification of the transverse aorta again noted.  Diffuse calcification of the mitral valve annulus.  The lungs are negative for pulmonary edema, or focal airspace consolidation.  Streaky left retrocardiac opacities are favored to reflect atelectasis.  No pulmonary nodule.  No acute osseous finding.  IMPRESSION:  1.  No acute cardiopulmonary disease. 2.  Persistent, but improved cardiomegaly compared to 12/16/2010 3.  Prominent calcification of the mitral valve annulus 4.  Atherosclerosis  Original Report Authenticated By: Alvino Blood Abd 2 Views  05/29/2012  *RADIOLOGY REPORT*  Clinical Data: Lower abdominal pain with nausea and vomiting. Partial small bowel obstruction.  ABDOMEN - 2 VIEW  Comparison: CT 05/21/2012.  Findings: Upright and 2 supine views.  Upright view demonstrates mild S-shaped thoracolumbar spine curvature.  No significant air  fluid levels or free intraperitoneal air.  The pelvis is excluded from the upright view.  The supine views demonstrate no residual small bowel dilatation. Moderate stool within the colon.  Low pelvis is also excluded from the supine views.  No pneumatosis.  Aortic atherosclerosis.  Mitral annular calcifications incidentally noted.  IMPRESSION: Resolved small bowel obstruction.  No evidence of free intraperitoneal air, pneumatosis, or other acute complication.  Possible constipation.  Exclusion of the pelvis from both the upright and supine images.  Original Report Authenticated By: Consuello Bossier, M.D.    Scheduled Meds:   . allopurinol  300 mg Oral Daily  . aspirin EC  81 mg Oral Daily  . atorvastatin  10 mg Oral Daily  . carvedilol  3.125 mg Oral BID WC  . ciprofloxacin  200 mg Intravenous Once  . ciprofloxacin  500 mg Oral BID  . dabigatran  75 mg  Oral Q12H  . feeding supplement  1 Container Oral BID BM  . furosemide  40 mg Intravenous Q12H  . gabapentin  300 mg Oral QHS  . glipiZIDE  5 mg Oral Q breakfast  . levothyroxine  88 mcg Oral QAC breakfast  . magnesium sulfate 1 - 4 g bolus IVPB  1 g Intravenous Once  . multivitamin with minerals  1 tablet Oral Daily  . ondansetron      . polyethylene glycol  17 g Oral Daily  . potassium chloride  30 mEq Oral BID  . potassium chloride  10 mEq Intravenous Q1 Hr x 4  . potassium chloride  10 mEq Intravenous Q1 Hr x 3  . senna-docusate  2 tablet Oral Once  . sodium chloride  3 mL Intravenous Q12H  . zolpidem  5 mg Oral QHS  . DISCONTD: sodium chloride   Intravenous STAT  . DISCONTD: diltiazem  240 mg Oral Daily  . DISCONTD: furosemide  80 mg Oral Daily  . DISCONTD: lisinopril  2.5 mg Oral Daily  . DISCONTD: multivitamin with minerals  1 tablet Oral Daily  . DISCONTD: potassium chloride  8 mEq Oral BID  . DISCONTD: potassium chloride  40 mEq Oral Daily   Continuous Infusions:   . DISCONTD: sodium chloride 1,000 mL (05/30/12 0352)      Stephani Police [facility] Pager [phone]  If 7PM-7AM, please contact night-coverage [email or web address] Password [email or web address] 05/30/2012, 11:28 AM   LOS: 1 day

## 2012-05-30 NOTE — Progress Notes (Signed)
INITIAL ADULT NUTRITION ASSESSMENT Date: 05/30/2012   Time: 10:20 AM  INTERVENTION:  Ensure Pudding (vanilla) twice daily (170 kcals, 4 gm protein per 4 oz cup) RD to follow for nutrition care plan  Reason for Assessment: Nutrition Risk Report  ASSESSMENT: Female 78 y.o.  Dx: CHF, UTI, chest pain  Hx:  Past Medical History  Diagnosis Date  . CHF (congestive heart failure)   . Hypertension   . Atrial fibrillation   . Peripheral neuropathy   . Asthma   . High cholesterol   . Type II diabetes mellitus   . PAD (peripheral artery disease)   . Enlarged heart   . Anginal pain 05/28/12  . Pneumonia 11/2010  . Sleep apnea   . Hypothyroidism   . Headache 05/29/12    "recently"  . Stroke 05/2005; 08/2010    !mini"  . Chronic kidney disease     "something on labs always say she has some problems"  . Personal history of gout     Related Meds:     . allopurinol  300 mg Oral Daily  . aspirin EC  81 mg Oral Daily  . atorvastatin  10 mg Oral Daily  . carvedilol  3.125 mg Oral BID WC  . ciprofloxacin  200 mg Intravenous Once  . ciprofloxacin  500 mg Oral BID  . dabigatran  75 mg Oral Q12H  . furosemide  40 mg Intravenous Q12H  . gabapentin  300 mg Oral QHS  . glipiZIDE  5 mg Oral Q breakfast  . levothyroxine  88 mcg Oral QAC breakfast  . lisinopril  2.5 mg Oral Daily  . magnesium sulfate 1 - 4 g bolus IVPB  1 g Intravenous Once  . multivitamin with minerals  1 tablet Oral Daily  . ondansetron      . potassium chloride  30 mEq Oral BID  . potassium chloride  10 mEq Intravenous Q1 Hr x 4  . potassium chloride  10 mEq Intravenous Q1 Hr x 3  . sodium chloride  3 mL Intravenous Q12H  . zolpidem  5 mg Oral QHS  . DISCONTD: sodium chloride   Intravenous STAT  . DISCONTD: diltiazem  240 mg Oral Daily  . DISCONTD: furosemide  80 mg Oral Daily  . DISCONTD: multivitamin with minerals  1 tablet Oral Daily  . DISCONTD: potassium chloride  8 mEq Oral BID  . DISCONTD: potassium chloride   40 mEq Oral Daily    Ht: 5\' 7"  (170.2 cm)  Wt: 167 lb 8.8 oz (76 kg)  Ideal Wt: 61.3 kg % Ideal Wt: 125%  Usual Wt: 179 lb % Usual Wt: 93%  Body mass index is 26.24 kg/(m^2).  Food/Nutrition Related Hx: unintentional weight loss > 10 lbs within the last month per admission nutrition screen  Labs:  CMP     Component Value Date/Time   NA 133* 05/30/2012 0133   K 2.8* 05/30/2012 0133   CL 93* 05/30/2012 0133   CO2 32 05/30/2012 0133   GLUCOSE 115* 05/30/2012 0133   BUN 17 05/30/2012 0133   CREATININE 0.96 05/30/2012 0133   CALCIUM 7.2* 05/30/2012 0133   PROT 5.4* 05/29/2012 1340   ALBUMIN 2.2* 05/29/2012 1340   AST 36 05/29/2012 1340   ALT 17 05/29/2012 1340   ALKPHOS 86 05/29/2012 1340   BILITOT 0.5 05/29/2012 1340   GFRNONAA 52* 05/30/2012 0133   GFRAA 61* 05/30/2012 0133     Intake/Output Summary (Last 24 hours) at 05/30/12 1021 Last data  filed at 05/30/12 0865  Gross per 24 hour  Intake   2915 ml  Output   1150 ml  Net   1765 ml    CBG (last 3)   Basename 05/30/12 0643 05/29/12 2110  GLUCAP 73 93    Diet Order: Carb Control  Supplements/Tube Feeding: N/A  IVF:    DISCONTD: sodium chloride Last Rate: 1,000 mL (05/30/12 0352)    Estimated Nutritional Needs:   Kcal: 1600-1800 Protein: 80-90 gm Fluid: 1.6-1.8 L  Patient admitted for chest pain; reports a decreased appetite for the past month; states she has been eating 3 meals per day, however, smaller portions than her usual intake (for example: jello & toast for breakfast); noted experienced nausea & vomiting as well; states she's lost approximately 12 lb; she did eat breakfast well this AM; amenable to Ensure Pudding supplement -- RD to order.  Patient meets criteria for non-severe (moderate) malnutrition in the context of chronic illness given < 75% intake of estimated energy requirement for > 1 month and 7% weight loss x 1 month  NUTRITION DIAGNOSIS: -Unintentional Weight Loss  RELATED TO: suboptimal oral  intake  AS EVIDENCE BY: 7% weight loss x 1 month  MONITORING/EVALUATION(Goals): Goal: Oral intake with meals & supplements to meet >/= 90% of estimated nutrition needs Monitor: PO & supplemental intake, weight, labs, I/O's  EDUCATION NEEDS: -No education needs identified at this time  Dietitian #: [phone]  DOCUMENTATION CODES Per approved criteria  -Non-severe (moderate) malnutrition in the context of chronic illness    Alger Memos 05/30/2012, 10:20 AM

## 2012-05-31 ENCOUNTER — Encounter (HOSPITAL_COMMUNITY): Payer: Self-pay | Admitting: Radiology

## 2012-05-31 ENCOUNTER — Inpatient Hospital Stay (HOSPITAL_COMMUNITY): Payer: Medicare Other

## 2012-05-31 DIAGNOSIS — I509 Heart failure, unspecified: Secondary | ICD-10-CM

## 2012-05-31 DIAGNOSIS — K559 Vascular disorder of intestine, unspecified: Secondary | ICD-10-CM | POA: Diagnosis present

## 2012-05-31 DIAGNOSIS — K55059 Acute (reversible) ischemia of intestine, part and extent unspecified: Secondary | ICD-10-CM

## 2012-05-31 DIAGNOSIS — A0472 Enterocolitis due to Clostridium difficile, not specified as recurrent: Secondary | ICD-10-CM

## 2012-05-31 DIAGNOSIS — E079 Disorder of thyroid, unspecified: Secondary | ICD-10-CM

## 2012-05-31 DIAGNOSIS — A419 Sepsis, unspecified organism: Principal | ICD-10-CM | POA: Diagnosis present

## 2012-05-31 DIAGNOSIS — I4891 Unspecified atrial fibrillation: Secondary | ICD-10-CM

## 2012-05-31 DIAGNOSIS — N390 Urinary tract infection, site not specified: Secondary | ICD-10-CM

## 2012-05-31 LAB — HEPATIC FUNCTION PANEL
ALT: 15 U/L (ref 0–35)
AST: 21 U/L (ref 0–37)
Albumin: 1.5 g/dL — ABNORMAL LOW (ref 3.5–5.2)
Alkaline Phosphatase: 75 U/L (ref 39–117)
Indirect Bilirubin: 0.2 mg/dL — ABNORMAL LOW (ref 0.3–0.9)
Total Protein: 3.8 g/dL — ABNORMAL LOW (ref 6.0–8.3)

## 2012-05-31 LAB — CBC
HCT: 32.2 % — ABNORMAL LOW (ref 36.0–46.0)
Hemoglobin: 11 g/dL — ABNORMAL LOW (ref 12.0–15.0)
MCHC: 34.2 g/dL (ref 30.0–36.0)
WBC: 39.8 10*3/uL — ABNORMAL HIGH (ref 4.0–10.5)

## 2012-05-31 LAB — URINE CULTURE

## 2012-05-31 LAB — BASIC METABOLIC PANEL
BUN: 20 mg/dL (ref 6–23)
Chloride: 99 mEq/L (ref 96–112)
GFR calc Af Amer: 48 mL/min — ABNORMAL LOW (ref >90)
GFR calc non Af Amer: 42 mL/min — ABNORMAL LOW (ref >90)
Potassium: 3.9 mEq/L (ref 3.5–5.1)
Sodium: 133 mEq/L — ABNORMAL LOW (ref 135–145)

## 2012-05-31 LAB — LIPASE, BLOOD: Lipase: 8 U/L — ABNORMAL LOW (ref 11–59)

## 2012-05-31 LAB — LACTIC ACID, PLASMA
Lactic Acid, Venous: 2.8 mmol/L — ABNORMAL HIGH (ref 0.5–2.2)
Lactic Acid, Venous: 2.6 mmol/L — ABNORMAL HIGH (ref 0.5–2.2)

## 2012-05-31 LAB — CLOSTRIDIUM DIFFICILE BY PCR: Toxigenic C. Difficile by PCR: NEGATIVE

## 2012-05-31 LAB — GLUCOSE, CAPILLARY
Glucose-Capillary: 174 mg/dL — ABNORMAL HIGH (ref 70–99)
Glucose-Capillary: 117 mg/dL — ABNORMAL HIGH (ref 70–99)
Glucose-Capillary: 120 mg/dL — ABNORMAL HIGH (ref 70–99)

## 2012-05-31 LAB — TSH: TSH: 1.913 u[IU]/mL (ref 0.350–4.500)

## 2012-05-31 LAB — CORTISOL: Cortisol, Plasma: 22.6 ug/dL

## 2012-05-31 MED ORDER — IOHEXOL 300 MG/ML  SOLN
20.0000 mL | INTRAMUSCULAR | Status: AC
  Administered 2012-05-31 (×2): 20 mL via ORAL

## 2012-05-31 MED ORDER — MAGNESIUM SULFATE BOLUS VIA INFUSION: 2.0000 g | Freq: Once | INTRAVENOUS | Status: DC

## 2012-05-31 MED ORDER — SODIUM CHLORIDE 0.9 % IV SOLN
500.0000 mg | Freq: Three times a day (TID) | INTRAVENOUS | Status: DC
  Administered 2012-05-31: 500 mg via INTRAVENOUS
  Filled 2012-05-31 (×3): qty 500

## 2012-05-31 MED ORDER — SODIUM CHLORIDE 0.9 % IV SOLN
INTRAVENOUS | Status: DC
  Administered 2012-05-31: 21:00:00 via INTRAVENOUS

## 2012-05-31 MED ORDER — SODIUM CHLORIDE 0.9 % IV SOLN
500.0000 mg | Freq: Three times a day (TID) | INTRAVENOUS | Status: DC
  Administered 2012-05-31 – 2012-06-05 (×16): 500 mg via INTRAVENOUS
  Filled 2012-05-31 (×20): qty 500

## 2012-05-31 MED ORDER — SODIUM CHLORIDE 0.9 % IV BOLUS (SEPSIS)
1000.0000 mL | Freq: Once | INTRAVENOUS | Status: AC
  Administered 2012-05-31: 1000 mL via INTRAVENOUS

## 2012-05-31 MED ORDER — LEVOTHYROXINE SODIUM 88 MCG PO TABS
88.0000 ug | ORAL_TABLET | Freq: Every day | ORAL | Status: DC
  Administered 2012-05-31 – 2012-06-06 (×7): 88 ug via ORAL
  Filled 2012-05-31 (×9): qty 1

## 2012-05-31 MED ORDER — MAGNESIUM SULFATE 40 MG/ML IJ SOLN
2.0000 g | Freq: Once | INTRAMUSCULAR | Status: AC
  Administered 2012-05-31: 2 g via INTRAVENOUS
  Filled 2012-05-31: qty 50

## 2012-05-31 MED ORDER — VANCOMYCIN HCL 1000 MG IV SOLR
750.0000 mg | Freq: Two times a day (BID) | INTRAVENOUS | Status: DC
  Administered 2012-05-31 (×2): 750 mg via INTRAVENOUS
  Filled 2012-05-31 (×4): qty 750

## 2012-05-31 MED ORDER — PHENYLEPHRINE HCL 10 MG/ML IJ SOLN
30.0000 ug/min | INTRAMUSCULAR | Status: DC
  Administered 2012-05-31: 40 ug/min via INTRAVENOUS
  Administered 2012-05-31: 20 ug/min via INTRAVENOUS
  Administered 2012-05-31: 40 ug/min via INTRAVENOUS
  Filled 2012-05-31 (×5): qty 1

## 2012-05-31 MED ORDER — SODIUM CHLORIDE 0.9 % IV BOLUS (SEPSIS)
500.0000 mL | Freq: Once | INTRAVENOUS | Status: AC
  Administered 2012-05-31: 500 mL via INTRAVENOUS

## 2012-05-31 MED ORDER — VANCOMYCIN HCL 1000 MG IV SOLR
750.0000 mg | Freq: Once | INTRAVENOUS | Status: AC
  Administered 2012-05-31: 750 mg via INTRAVENOUS
  Filled 2012-05-31: qty 750

## 2012-05-31 NOTE — Progress Notes (Signed)
  Echocardiogram 2D Echocardiogram has been performed.  Jaryn Hocutt 05/31/2012, 10:53 AM

## 2012-05-31 NOTE — Progress Notes (Signed)
BP in 70s-80s/40s. Pt AOx4, denies dizziness. HR 90s-110s Atrial fib on IV Amio. NP notified. Orders received. Will continue to monitor. Onedia Vargus, Marzella Schlein

## 2012-05-31 NOTE — Consult Note (Signed)
Name: [name] MRN: [id number] DOB: November 06, 1923    LOS: 2  Referring Provider:  [facility] Reason for Referral:  Hypotension  PULMONARY / CRITICAL CARE MEDICINE  HPI:  78 yo with heart failure and atrial fibrillation presenting on 7/31 with epigastric abdominal pain and admitted for treatment of UTI.  Apparently was taking antibiotics at home for persistent UTI. She was started on Ciprofloxacin.  In spite of that her white blood count kept increasing and she developed bloody diarrhea, hypotension and atrial fibrillation with RVR.  Had limited response to fluids.  Heart rate improved once on Amiodarone infusion.  Transferred to ICU for farther management.  Past Medical History  Diagnosis Date  . CHF (congestive heart failure)   . Hypertension   . Atrial fibrillation   . Peripheral neuropathy   . Asthma   . High cholesterol   . Type II diabetes mellitus   . PAD (peripheral artery disease)   . Enlarged heart   . Anginal pain 05/28/12  . Pneumonia 11/2010  . Sleep apnea   . Hypothyroidism   . Headache 05/29/12    "recently"  . Stroke 05/2005; 08/2010    !mini"  . Chronic kidney disease     "something on labs always say she has some problems"  . Personal history of gout    Past Surgical History  Procedure Date  . Tonsillectomy and adenoidectomy 1960's  . Dilation and curettage of uterus   . Vaginal hysterectomy 1970's  . Total knee arthroplasty 1970-~2002    left; right  . Cataract extraction w/ intraocular lens  implant, bilateral ?1970's   Prior to Admission medications   Medication Sig Start Date End Date Taking? Authorizing Provider  allopurinol (ZYLOPRIM) 300 MG tablet Take 300 mg by mouth daily.   Yes Historical Provider, MD  aspirin EC 81 MG tablet Take 81 mg by mouth daily.   Yes Historical Provider, MD  atorvastatin (LIPITOR) 10 MG tablet Take 10 mg by mouth daily.   Yes Historical Provider, MD  Calcium Carb-Cholecalciferol (CALCIUM + D3) 600-200 MG-UNIT TABS Take 1  tablet by mouth daily.   Yes Historical Provider, MD  dabigatran (PRADAXA) 75 MG CAPS Take 75 mg by mouth every 12 (twelve) hours.   Yes Historical Provider, MD  diltiazem (TIAZAC) 240 MG 24 hr capsule Take 240 mg by mouth daily.   Yes Historical Provider, MD  furosemide (LASIX) 80 MG tablet Take 80 mg by mouth daily.   Yes Historical Provider, MD  gabapentin (NEURONTIN) 300 MG capsule Take 300 mg by mouth at bedtime.   Yes Historical Provider, MD  Garlic (GARLIQUE) 400 MG TBEC Take 1 tablet by mouth daily.   Yes Historical Provider, MD  glipiZIDE (GLUCOTROL XL) 5 MG 24 hr tablet Take 5 mg by mouth daily.   Yes Historical Provider, MD  levofloxacin (LEVAQUIN) 500 MG tablet Take 500 mg by mouth daily. Started 05/17/12   Yes Historical Provider, MD  levothyroxine (SYNTHROID, LEVOTHROID) 88 MCG tablet Take 88 mcg by mouth daily.   Yes Historical Provider, MD  Multiple Vitamins-Minerals (MULTIVITAMIN WITH MINERALS) tablet Take 1 tablet by mouth daily.   Yes Historical Provider, MD  Omega-3 Fatty Acids (FISH OIL) 1000 MG CAPS Take 1 capsule by mouth daily.   Yes Historical Provider, MD  omeprazole (PRILOSEC) 20 MG capsule Take 20 mg by mouth daily.   Yes Historical Provider, MD  potassium chloride (KLOR-CON) 8 MEQ tablet Take 8 mEq by mouth 2 (two) times daily. For  7 days. Started 05/28/12   Yes Historical Provider, MD  PRESCRIPTION MEDICATION Take 1 tablet by mouth 2 (two) times daily. Methylfolate/Calcium   Yes Historical Provider, MD  Probiotic Product (PROBIOTIC DAILY PO) Take 1 tablet by mouth daily.   Yes Historical Provider, MD  vitamin C (ASCORBIC ACID) 500 MG tablet Take 500 mg by mouth daily.   Yes Historical Provider, MD  zaleplon (SONATA) 5 MG capsule Take 5 mg by mouth at bedtime.   Yes Historical Provider, MD   Allergies Allergies  Allergen Reactions  . Aspirin Other (See Comments)    Tears my stomach up; "can take coated Aspirin qd without problems"  . Nexium (Esomeprazole Magnesium)  Rash  . Penicillins Rash   Family History History reviewed. No pertinent family history. Social History  reports that she has never smoked. She has never used smokeless tobacco. She reports that she does not drink alcohol or use illicit drugs.  Review Of Systems:  Reports being tired and having abdominal discomfort.  Otherwise negative.  Brief patient description:   78 yo with systolic heart failure and atrial fibrillation presenting on 7/31 with epigastric abdominal pain and admitted for treatment of UTI.  Apparently was taking antibiotics at home for persistent UTI. She was started on Ciprofloxacin.  In spite of that her white blood count kept increasing and she developed bloody diarrhea, hypotension and atrial fibrillation with RVR.  Had limited response to fluids.  Heart rate improved once on Amiodarone infusion.  Transferred to ICU for farther management.  Events Since Admission: 7/31  Admitted with epigastric pain, treated for UTI 8/2    Transferred to ICU for hypotension and afib with RVR  Current Status:  Vital Signs: Temp:  [97.5 F (36.4 C)-98.4 F (36.9 C)] 97.8 F (36.6 C) (08/02 0342) Pulse Rate:  [61-134] 102  (08/02 0530) Resp:  [18-34] 29  (08/02 0530) BP: (62-116)/(40-79) 82/48 mmHg (08/02 0530) SpO2:  [91 %-100 %] 100 % (08/02 0530) FiO2 (%):  [2 %] 2 % (08/02 0345) Weight:  [81.6 kg (179 lb 14.3 oz)] 81.6 kg (179 lb 14.3 oz) (08/02 0500)  Physical Examination: General:  Somewhat uncomfortable Neuro:  Awake, alert, oriented HEENT:  PERRL Neck:  No JVD Cardiovascular:  Irregular heart rate Lungs:  Bilateral diminished air entry Abdomen:  Diffuse tenderness, no rebound, diminished bowel sounds Musculoskeletal:  Moves all extremities Skin:  Bruising on posterior trunk  Principal Problem:  *UTI (lower urinary tract infection) Active Problems:  Chest pain  Vomiting  Constipation  Atrial fibrillation  CHF (congestive heart failure)  Thyroid disease   Hypokalemia  Hyponatremia  Sepsis  Ischemic necrosis of small bowel  C. difficile colitis  ASSESSMENT AND PLAN  PULMONARY No results found for this basename: PHART:5,PCO2:5,PCO2ART:5,PO2ART:5,HCO3:5,O2SAT:5 in the last 168 hours  Ventilator Settings: Vent Mode:  [-]  FiO2 (%):  [2 %] 2 %  CXR:  7/31 >>> nad ETT:  NA  A:  No active issues. P:   DNI status  CARDIOVASCULAR  Lab 05/31/12 0227 05/30/12 2135 05/30/12 2134 05/30/12 0853 05/30/12 0133 05/29/12 1711  TROPONINI -- -- <0.30 <0.30 <0.30 <0.30  LATICACIDVEN -- 4.4* -- -- -- --  PROBNP 2404.0* -- -- -- -- 1621.0*   ECG:  7/31 >>> Atrial fibrillation, RBBB Lines: NA  A: Atrial fibrillation with rapid ventricular response.  SIRS / Sepsis.  Marginal response to fluids but MAP > 65 on minimal Neo-Synephrine. Congestive heart failure by history (no recent TTE, EF 60% in 2011).  No evidence of ischemia. P:  TTE Trend lactate Amiodarone gtt Neo-Synephrine via PIV for now May need CVL Gaol MAP > 60 Lipitor  RENAL  Lab 05/31/12 0227 05/30/12 2134 05/30/12 1124 05/30/12 0133 05/29/12 1340  NA 133* -- 132* 133* 131*  K 3.9 -- 3.3* -- --  CL 99 -- 91* 93* 90*  CO2 23 -- 32 32 31  BUN 20 -- 14 17 18   CREATININE 1.16* -- 0.97 0.96 1.05  CALCIUM 7.0* -- 7.4* 7.2* 8.1*  MG -- 1.5 -- 1.4* --  PHOS -- -- -- -- --   Intake/Output      08/01 0701 - 08/02 0700   P.O. 360   I.V. (mL/kg) 816.6 (10)   IV Piggyback 1350   Total Intake(mL/kg) 2526.6 (31)   Urine (mL/kg/hr) 1800 (0.9)   Total Output 1800   Net +726.6       Stool Occurrence 7 x    Foley:  NA  A:  Acute renal failure / AKI vs prerenal. Hypomagnesemia, supplemented 8/1. P:   Trend BMP Recheck Mg Goal K >4, Mg > 2  GASTROINTESTINAL  Lab 05/31/12 0227 05/29/12 1340  AST 21 36  ALT 15 17  ALKPHOS 75 86  BILITOT 0.3 0.5  PROT 3.8* 5.4*  ALBUMIN 1.5* 2.2*   A:  Abdominal pain.  Findings suggestive of possible mesenteric ischemia and partial SBO on  abdominal CT 7/23.  Concern for ischemic bowel vs pseudomembranous colitis.  Severe malnutrition, P:   Repeat abdomin / pelvis CT (oral contrast only) NPO Amylase / lipase  HEMATOLOGIC  Lab 05/31/12 0227 05/30/12 2101 05/29/12 1436 05/29/12 1340  HGB 11.0* 13.3 12.3 --  HCT 32.2* 38.4 34.7* --  PLT 231 265 262 --  INR -- -- -- 1.43  APTT -- -- -- 42*   A:  Anemia, blood loss vs dilutional.  On Pradaxa until today.  History of fall.  Concern for retroperitoneal hemorrhage. P:  Trend CBC Pradaxa held  INFECTIOUS  Lab 05/31/12 0227 05/30/12 2101 05/29/12 1436  WBC 39.8* 30.6* 11.7*  PROCALCITON -- -- --   Cultures: 8/2    Blood >>> 7/31  Urine >>> E.coli (sensetivities pending)  Antibiotics: Cipro 7/31 >>> 8/1 Flagyl 8/1 >>> Primaxin 8/2 >>> Vancomycin 8/2 >>>  A:  UTI with E.Coli.  Suspected C.dif. P:   Trend CBC PCT Awaiting C.dif and sensitivity results.  ENDOCRINE  Lab 05/31/12 0343 05/30/12 2342 05/30/12 2043 05/30/12 1623 05/30/12 1122  GLUCAP 195* 263* 346* 131* 129*   A:  DM.  Hyperglycemia.  Hypothyroidism.  Unknown adrenal function. P:   SSI/CBG Cortisol level Levothyroxine  NEUROLOGIC  A:  No active issues. P:   No interventions required Neuron tin Ambien  BEST PRACTICE / DISPOSITION Level of Care:  ICU Primary Service:  PCCM Consultants:  None Code Status:  DNR/DNI, vasopressors OK Diet:  NPO except Rx  DVT Px:  SCDs GI Px:  Pepcid Skin Integrity:  Intact  Social / Family:  Son updated at bedside  The patient is critically ill with multiple organ systems failure and requires high complexity decision making for assessment and support, frequent evaluation and titration of therapies, application of advanced monitoring technologies and extensive interpretation of multiple databases. Critical Care Time devoted to patient care services described in this note is 55 minutes.  Lonia Farber, M.D. Pulmonary and Critical Care  Medicine [facility] Pager: [phone]  05/31/2012, 5:38 AM

## 2012-05-31 NOTE — Progress Notes (Signed)
[place], NP notified of BP 64/47. Pt AOx4, no complaints of pain, states "it gets tender when I move, but does not hurt when I lay still."   250cc NS bolus given. BP increased to 80s/40s. Orders received. Additional 500cc bolus given, BP increased to 80s-low 90s/50s. Started Amio IV per orders. Will continue to monitor. Keller Mikels, Marzella Schlein

## 2012-05-31 NOTE — Clinical Documentation Improvement (Signed)
SEPSIS DOCUMENTATION QUERY  THIS DOCUMENT IS NOT A PERMANENT PART OF THE MEDICAL RECORD  TO RESPOND TO THE THIS QUERY, FOLLOW THE INSTRUCTIONS BELOW:  1. If needed, update documentation for the patient's encounter via the notes activity.  2. Access this query again and click edit on the In [employer].  3. After updating, or not, click F2 to complete all highlighted (required) fields concerning your review. Select "additional documentation in the medical record" OR "no additional documentation provided".  4. Click Sign note button.  5. The deficiency will fall out of your In Basket *Please let us know if you are not able to complete this workflow by phone or e-mail (listed below).  Please update your documentation within the medical record to reflect your response to this query.                                                                                    05/31/12  Dear Dr. Marin Shutter Marton Redwood,  In a better effort to capture your patient's severity of illness, reflect appropriate length of stay and utilization of resources, a review of the patient medical record has revealed the following indicators.    Based on your clinical judgment, please clarify and document in a progress note and/or discharge summary the clinical condition associated with the following supporting information:  In responding to this query please exercise your independent judgment.  The fact that a query is asked, does not imply that any particular answer is desired or expected.    Possible Clinical Conditions?  Septicemia / Sepsis  Severe Sepsis  Septic Shock  Sepsis with UTI  Cannot clinically Determine    Risk Factors: Progress notes of 8/2 indicates patient was taking antibiotics at home for treatment of UTI. Sepsis noted per 8/2 progress notes. Please document in the progress notes if Sepsis was present on admission.   Diagnostics: 7/31: WBC: 11.7 8/01: WBC: 30.6 8/01: lactic acid,  plasma: 7.4 8/02: lactic acid, plasma: 2.8  Reviewed:  no additional documentation provided  Thank You,  Marciano Sequin,  Clinical Documentation Specialist:  Pager: [phone]  Health Information Management [place]

## 2012-05-31 NOTE — Progress Notes (Signed)
ANTIBIOTIC CONSULT NOTE - INITIAL  Pharmacy Consult for Vancocin/Primaxin Indication: rule out sepsis  Allergies  Allergen Reactions  . Aspirin Other (See Comments)    Tears my stomach up; "can take coated Aspirin qd without problems"  . Nexium (Esomeprazole Magnesium) Rash  . Penicillins Rash    Patient Measurements: Height: 5\' 7"  (170.2 cm) Weight: 167 lb 8.8 oz (76 kg) IBW/kg (Calculated) : 61.6   Vital Signs: Temp: 98.3 F (36.8 C) (08/01 2342) Temp src: Oral (08/01 2342) BP: 75/40 mmHg (08/01 2355) Pulse Rate: 102  (08/01 2355)  Labs:  Basename 05/30/12 2101 05/30/12 1124 05/30/12 0133 05/29/12 1436 05/29/12 1340  WBC 30.6* -- -- 11.7* --  HGB 13.3 -- -- 12.3 --  PLT 265 -- -- 262 --  LABCREA -- -- -- -- --  CREATININE -- 0.97 0.96 -- 1.05   Estimated Creatinine Clearance: 45.1 ml/min (by C-G formula based on Cr of 0.97).  Microbiology: Recent Results (from the past 720 hour(s))  URINE CULTURE     Status: Normal (Preliminary result)   Collection Time   05/29/12  2:08 PM      Component Value Range Status Comment   Specimen Description URINE, CLEAN CATCH   Final    Special Requests ADDED 05/29/12 1729   Final    Culture  Setup Time 05/29/2012 17:52   Final    Colony Count >=100,000 COLONIES/ML   Final    Culture ESCHERICHIA COLI   Final    Report Status PENDING   Incomplete   MRSA PCR SCREENING     Status: Normal   Collection Time   05/30/12 10:36 PM      Component Value Range Status Comment   MRSA by PCR NEGATIVE  NEGATIVE Final     Medical History: Past Medical History  Diagnosis Date  . CHF (congestive heart failure)   . Hypertension   . Atrial fibrillation   . Peripheral neuropathy   . Asthma   . High cholesterol   . Type II diabetes mellitus   . PAD (peripheral artery disease)   . Enlarged heart   . Anginal pain 05/28/12  . Pneumonia 11/2010  . Sleep apnea   . Hypothyroidism   . Headache 05/29/12    "recently"  . Stroke 05/2005; 08/2010   !mini"  . Chronic kidney disease     "something on labs always say she has some problems"  . Personal history of gout     Medications:  Prescriptions prior to admission  Medication Sig Dispense Refill  . allopurinol (ZYLOPRIM) 300 MG tablet Take 300 mg by mouth daily.      Marland Kitchen aspirin EC 81 MG tablet Take 81 mg by mouth daily.      Marland Kitchen atorvastatin (LIPITOR) 10 MG tablet Take 10 mg by mouth daily.      . Calcium Carb-Cholecalciferol (CALCIUM + D3) 600-200 MG-UNIT TABS Take 1 tablet by mouth daily.      . dabigatran (PRADAXA) 75 MG CAPS Take 75 mg by mouth every 12 (twelve) hours.      Marland Kitchen diltiazem (TIAZAC) 240 MG 24 hr capsule Take 240 mg by mouth daily.      . furosemide (LASIX) 80 MG tablet Take 80 mg by mouth daily.      Marland Kitchen gabapentin (NEURONTIN) 300 MG capsule Take 300 mg by mouth at bedtime.      . Garlic (GARLIQUE) 400 MG TBEC Take 1 tablet by mouth daily.      Marland Kitchen glipiZIDE (  GLUCOTROL XL) 5 MG 24 hr tablet Take 5 mg by mouth daily.      Marland Kitchen levofloxacin (LEVAQUIN) 500 MG tablet Take 500 mg by mouth daily. Started 05/17/12      . levothyroxine (SYNTHROID, LEVOTHROID) 88 MCG tablet Take 88 mcg by mouth daily.      . Multiple Vitamins-Minerals (MULTIVITAMIN WITH MINERALS) tablet Take 1 tablet by mouth daily.      . Omega-3 Fatty Acids (FISH OIL) 1000 MG CAPS Take 1 capsule by mouth daily.      Marland Kitchen omeprazole (PRILOSEC) 20 MG capsule Take 20 mg by mouth daily.      . potassium chloride (KLOR-CON) 8 MEQ tablet Take 8 mEq by mouth 2 (two) times daily. For 7 days. Started 05/28/12      . PRESCRIPTION MEDICATION Take 1 tablet by mouth 2 (two) times daily. Methylfolate/Calcium      . Probiotic Product (PROBIOTIC DAILY PO) Take 1 tablet by mouth daily.      . vitamin C (ASCORBIC ACID) 500 MG tablet Take 500 mg by mouth daily.      . zaleplon (SONATA) 5 MG capsule Take 5 mg by mouth at bedtime.       Scheduled:    . allopurinol  300 mg Oral Daily  . amiodarone  150 mg Intravenous Once  . atorvastatin   10 mg Oral Daily  . famotidine  20 mg Oral QHS  . feeding supplement  1 Container Oral BID BM  . gabapentin  300 mg Oral QHS  . insulin aspart  0-9 Units Subcutaneous Q4H  . levothyroxine  88 mcg Oral QAC breakfast  . magnesium sulfate 1 - 4 g bolus IVPB  1 g Intravenous Once  . metronidazole  500 mg Intravenous Q8H  . multivitamin with minerals  1 tablet Oral Daily  . potassium chloride  30 mEq Oral BID  . potassium chloride  10 mEq Intravenous Q1 Hr x 3  . senna-docusate  2 tablet Oral Once  . sodium chloride  250 mL Intravenous Once  . sodium chloride  500 mL Intravenous Once  . sodium chloride  500 mL Intravenous Once  . sodium chloride  3 mL Intravenous Q12H  . zolpidem  5 mg Oral QHS  . DISCONTD: sodium chloride   Intravenous STAT  . DISCONTD: aspirin EC  81 mg Oral Daily  . DISCONTD: carvedilol  3.125 mg Oral BID WC  . DISCONTD: ciprofloxacin  400 mg Intravenous Q12H  . DISCONTD: ciprofloxacin  500 mg Oral BID  . DISCONTD: dabigatran  75 mg Oral Q12H  . DISCONTD: furosemide  40 mg Intravenous Q12H  . DISCONTD: glipiZIDE  5 mg Oral Q breakfast  . DISCONTD: insulin aspart  0-9 Units Subcutaneous Q4H  . DISCONTD: lisinopril  2.5 mg Oral Daily  . DISCONTD: polyethylene glycol  17 g Oral Daily  . DISCONTD: potassium chloride  40 mEq Oral Daily   Infusions:    . amiodarone (NEXTERONE PREMIX) 360 mg/200 mL dextrose     Followed by  . amiodarone (NEXTERONE PREMIX) 360 mg/200 mL dextrose    . DISCONTD: sodium chloride 1,000 mL (05/30/12 0352)  . DISCONTD: diltiazem (CARDIZEM) infusion      Assessment: 78yo female admitted for epigastric pain continues to c/o generalized abdominal pain, has been tachycardic to 160s and hypotensive to 70s, to begin IV ABX for signs of early sepsis.  Goal of Therapy:  Vancomycin trough level 15-20 mcg/ml  Plan:  Will begin vancomycin 750mg   IV Q12H and Primaxin 500mg  IV Q8H and monitor CBC, Cx, levels prn.  Colleen Can PharmD  BCPS 05/31/2012,12:17 AM

## 2012-05-31 NOTE — Progress Notes (Signed)
This visit was in response to a spiritual care consult.  Pt was receptive of visit.  I introduced myself and chaplain services.  Pt expressed that she was feeling better; that she did not feel like she did earlier.  Pt explained she had feelings of wanting to die in order to escape the suffering, but she is better now.  We explored this topic and she does seem to be future-oriented.  We also discussed her family support.  Pt thanked me for the visit and conversation.  Please page if further assistance is needed. Dellie Catholic  [phone] personal pager   [phone] oncall pager

## 2012-05-31 NOTE — Progress Notes (Signed)
Name: [name] MRN: [id number] DOB: [birth year]    LOS: 2  Referring Provider:  [place] [facility] Reason for Referral:  Hypotension  PULMONARY / CRITICAL CARE MEDICINE  HPI:  78 yo with heart failure and atrial fibrillation presenting on 7/31 with epigastric abdominal pain and admitted for treatment of UTI.  Apparently was taking antibiotics at home for persistent UTI. She was started on Ciprofloxacin.  In spite of that her white blood count kept increasing and she developed bloody diarrhea, hypotension and atrial fibrillation with RVR.  Had limited response to fluids.  Heart rate improved once on Amiodarone infusion.  Transferred to ICU for farther management.  Past Medical History  Diagnosis Date  . CHF (congestive heart failure)   . Hypertension   . Atrial fibrillation   . Peripheral neuropathy   . Asthma   . High cholesterol   . Type II diabetes mellitus   . PAD (peripheral artery disease)   . Enlarged heart   . Anginal pain 05/28/12  . Pneumonia 11/2010  . Sleep apnea   . Hypothyroidism   . Headache 05/29/12    "recently"  . Stroke 05/2005; 08/2010    !mini"  . Chronic kidney disease     "something on labs always say she has some problems"  . Personal history of gout    Past Surgical History  Procedure Date  . Tonsillectomy and adenoidectomy 1960's  . Dilation and curettage of uterus   . Vaginal hysterectomy 1970's  . Total knee arthroplasty 1970-~2002    left; right  . Cataract extraction w/ intraocular lens  implant, bilateral ?1970's   Prior to Admission medications   Medication Sig Start Date End Date Taking? Authorizing Provider  allopurinol (ZYLOPRIM) 300 MG tablet Take 300 mg by mouth daily.   Yes Historical Provider, MD  aspirin EC 81 MG tablet Take 81 mg by mouth daily.   Yes Historical Provider, MD  atorvastatin (LIPITOR) 10 MG tablet Take 10 mg by mouth daily.   Yes Historical Provider, MD  Calcium Carb-Cholecalciferol (CALCIUM + D3) 600-200 MG-UNIT TABS Take 1  tablet by mouth daily.   Yes Historical Provider, MD  dabigatran (PRADAXA) 75 MG CAPS Take 75 mg by mouth every 12 (twelve) hours.   Yes Historical Provider, MD  diltiazem (TIAZAC) 240 MG 24 hr capsule Take 240 mg by mouth daily.   Yes Historical Provider, MD  furosemide (LASIX) 80 MG tablet Take 80 mg by mouth daily.   Yes Historical Provider, MD  gabapentin (NEURONTIN) 300 MG capsule Take 300 mg by mouth at bedtime.   Yes Historical Provider, MD  Garlic (GARLIQUE) 400 MG TBEC Take 1 tablet by mouth daily.   Yes Historical Provider, MD  glipiZIDE (GLUCOTROL XL) 5 MG 24 hr tablet Take 5 mg by mouth daily.   Yes Historical Provider, MD  levofloxacin (LEVAQUIN) 500 MG tablet Take 500 mg by mouth daily. Started 05/17/12   Yes Historical Provider, MD  levothyroxine (SYNTHROID, LEVOTHROID) 88 MCG tablet Take 88 mcg by mouth daily.   Yes Historical Provider, MD  Multiple Vitamins-Minerals (MULTIVITAMIN WITH MINERALS) tablet Take 1 tablet by mouth daily.   Yes Historical Provider, MD  Omega-3 Fatty Acids (FISH OIL) 1000 MG CAPS Take 1 capsule by mouth daily.   Yes Historical Provider, MD  omeprazole (PRILOSEC) 20 MG capsule Take 20 mg by mouth daily.   Yes Historical Provider, MD  potassium chloride (KLOR-CON) 8 MEQ tablet Take 8 mEq by mouth 2 (two) times daily. For  7 days. Started 05/28/12   Yes Historical Provider, MD  PRESCRIPTION MEDICATION Take 1 tablet by mouth 2 (two) times daily. Methylfolate/Calcium   Yes Historical Provider, MD  Probiotic Product (PROBIOTIC DAILY PO) Take 1 tablet by mouth daily.   Yes Historical Provider, MD  vitamin C (ASCORBIC ACID) 500 MG tablet Take 500 mg by mouth daily.   Yes Historical Provider, MD  zaleplon (SONATA) 5 MG capsule Take 5 mg by mouth at bedtime.   Yes Historical Provider, MD   Allergies Allergies  Allergen Reactions  . Aspirin Other (See Comments)    Tears my stomach up; "can take coated Aspirin qd without problems"  . Nexium (Esomeprazole Magnesium)  Rash  . Penicillins Rash   Family History History reviewed. No pertinent family history. Social History  reports that she has never smoked. She has never used smokeless tobacco. She reports that she does not drink alcohol or use illicit drugs.  Review Of Systems:  Reports being tired and having abdominal discomfort.  Otherwise negative.  Brief patient description:   78 yo with systolic heart failure and atrial fibrillation presenting on 7/31 with epigastric abdominal pain and admitted for treatment of UTI.  Apparently was taking antibiotics at home for persistent UTI. She was started on Ciprofloxacin.  In spite of that her white blood count kept increasing and she developed bloody diarrhea, hypotension and atrial fibrillation with RVR.  Had limited response to fluids.  Heart rate improved once on Amiodarone infusion.  Transferred to ICU for farther management.  Events Since Admission: 7/31  Admitted with epigastric pain, treated for UTI 8/2    Transferred to ICU for hypotension and afib with RVR  Current Status:  Vital Signs: Temp:  [97.5 F (36.4 C)-98.3 F (36.8 C)] 97.8 F (36.6 C) (08/02 0342) Pulse Rate:  [61-134] 74  (08/02 0800) Resp:  [17-34] 21  (08/02 0800) BP: (62-116)/(37-79) 100/48 mmHg (08/02 0800) SpO2:  [91 %-100 %] 99 % (08/02 0800) FiO2 (%):  [2 %] 2 % (08/02 0345) Weight:  [81.6 kg (179 lb 14.3 oz)] 81.6 kg (179 lb 14.3 oz) (08/02 0500)  Physical Examination: General:  Somewhat uncomfortable Neuro:  Awake, alert, oriented HEENT:  PERRL Neck:  No JVD Cardiovascular:  Irregular heart rate Lungs:  Bilateral diminished air entry Abdomen:  Diffuse tenderness, no rebound, diminished bowel sounds Musculoskeletal:  Moves all extremities Skin:  Bruising on posterior trunk  Principal Problem:  *UTI (lower urinary tract infection) Active Problems:  Chest pain  Vomiting  Constipation  Atrial fibrillation  CHF (congestive heart failure)  Thyroid disease   Hypokalemia  Hyponatremia  Sepsis  Ischemic necrosis of small bowel  C. difficile colitis  ASSESSMENT AND PLAN  PULMONARY No results found for this basename: PHART:5,PCO2:5,PCO2ART:5,PO2ART:5,HCO3:5,O2SAT:5 in the last 168 hours  Ventilator Settings: Vent Mode:  [-]  FiO2 (%):  [2 %] 2 %  CXR:  7/31 >>> nad ETT:  NA  A:  No active issues. P:   DNI status  CARDIOVASCULAR  Lab 05/31/12 0710 05/31/12 0227 05/30/12 2135 05/30/12 2134 05/30/12 0853 05/30/12 0133 05/29/12 1711  TROPONINI -- -- -- <0.30 <0.30 <0.30 <0.30  LATICACIDVEN 2.8* -- 4.4* -- -- -- --  PROBNP -- 2404.0* -- -- -- -- 1621.0*   ECG:  7/31 >>> Atrial fibrillation, RBBB Lines: NA  A: Atrial fibrillation with rapid ventricular response.  SIRS / Sepsis.  Marginal response to fluids but MAP > 65 on minimal Neo-Synephrine. Congestive heart failure by history (no recent  TTE, EF 60% in 2011).  No evidence of ischemia. P:  TTE Trend lactate Amiodarone gtt Neo-Synephrine via PIV for now May need CVL Gaol MAP > 60 Lipitor  RENAL  Lab 05/31/12 0740 05/31/12 0227 05/30/12 2134 05/30/12 1124 05/30/12 0133 05/29/12 1340  NA -- 133* -- 132* 133* 131*  K -- 3.9 -- 3.3* -- --  CL -- 99 -- 91* 93* 90*  CO2 -- 23 -- 32 32 31  BUN -- 20 -- 14 17 18   CREATININE -- 1.16* -- 0.97 0.96 1.05  CALCIUM -- 7.0* -- 7.4* 7.2* 8.1*  MG 1.3* -- 1.5 -- 1.4* --  PHOS -- -- -- -- -- --   Intake/Output      08/01 0701 - 08/02 0700 08/02 0701 - 08/03 0700   P.O. 360    I.V. (mL/kg) 1386.3 (17) 116.7 (1.4)   IV Piggyback 1350    Total Intake(mL/kg) 3096.3 (37.9) 116.7 (1.4)   Urine (mL/kg/hr) 1900 (1)    Total Output 1900    Net +1196.3 +116.7        Stool Occurrence 7 x     Foley:  NA  A:  Acute renal failure / AKI vs prerenal. Hypomagnesemia, supplemented 8/1. P:   Trend BMP Recheck Mg Goal K >4, Mg > 2  GASTROINTESTINAL  Lab 05/31/12 0227 05/29/12 1340  AST 21 36  ALT 15 17  ALKPHOS 75 86  BILITOT 0.3 0.5    PROT 3.8* 5.4*  ALBUMIN 1.5* 2.2*   A:  Abdominal pain.  Findings suggestive of possible mesenteric ischemia and partial SBO on abdominal CT 7/23.  Concern for ischemic bowel vs pseudomembranous colitis.  Severe malnutrition, P:   Repeat abdomin / pelvis CT (oral contrast only) with bowel edema, likely infectious but ? Of ischemic changes still exists. Maintain NPO due to N/V. Amylase / lipase negative.  HEMATOLOGIC  Lab 05/31/12 0227 05/30/12 2101 05/29/12 1436 05/29/12 1340  HGB 11.0* 13.3 12.3 --  HCT 32.2* 38.4 34.7* --  PLT 231 265 262 --  INR -- -- -- 1.43  APTT -- -- -- 42*   A:  Anemia, blood loss vs dilutional.  On Pradaxa until today.  History of fall.  Concern for retroperitoneal hemorrhage. P:  Trend CBC Pradaxa held  INFECTIOUS  Lab 05/31/12 0740 05/31/12 0227 05/30/12 2101 05/29/12 1436  WBC -- 39.8* 30.6* 11.7*  PROCALCITON 8.22 -- -- --   Cultures: 8/2    Blood >>> 7/31  Urine >>> E.coli (sensetivities pending)  Antibiotics: Cipro 7/31 >>> 8/1 Flagyl 8/1 >>> Primaxin 8/2 >>> Vancomycin 8/2 >>>  A:  UTI with E.Coli.  Suspected C.dif. P:   Trend CBC PCT 8.22 Awaiting C.dif and sensitivity results.  ENDOCRINE  Lab 05/31/12 0821 05/31/12 0343 05/30/12 2342 05/30/12 2043 05/30/12 1623  GLUCAP 174* 195* 263* 346* 131*   A:  DM.  Hyperglycemia.  Hypothyroidism.  Unknown adrenal function. P:   SSI/CBG Cortisol level pending Levothyroxine  NEUROLOGIC  A:  No active issues. P:   No interventions required Neurontin Ambien  The patient is critically ill with multiple organ systems failure and requires high complexity decision making for assessment and support, frequent evaluation and titration of therapies, application of advanced monitoring technologies and extensive interpretation of multiple databases. Critical Care Time devoted to patient care services described in this note is 30 minutes.  Koren Bound, M.D. Pulmonary and Critical Care  Medicine [facility] Pager: [phone]  05/31/2012, 10:35 AM

## 2012-06-01 DIAGNOSIS — J45909 Unspecified asthma, uncomplicated: Secondary | ICD-10-CM

## 2012-06-01 DIAGNOSIS — E871 Hypo-osmolality and hyponatremia: Secondary | ICD-10-CM

## 2012-06-01 LAB — GLUCOSE, CAPILLARY: Glucose-Capillary: 153 mg/dL — ABNORMAL HIGH (ref 70–99)

## 2012-06-01 LAB — BASIC METABOLIC PANEL
CO2: 22 mEq/L (ref 19–32)
Calcium: 7.4 mg/dL — ABNORMAL LOW (ref 8.4–10.5)
Chloride: 97 mEq/L (ref 96–112)
Creatinine, Ser: 0.86 mg/dL (ref 0.50–1.10)
GFR calc Af Amer: 69 mL/min — ABNORMAL LOW (ref >90)
Sodium: 127 mEq/L — ABNORMAL LOW (ref 135–145)

## 2012-06-01 LAB — PHOSPHORUS: Phosphorus: 2.6 mg/dL (ref 2.3–4.6)

## 2012-06-01 LAB — CBC
Platelets: 219 10*3/uL (ref 150–400)
RBC: 3.28 MIL/uL — ABNORMAL LOW (ref 3.87–5.11)
RDW: 15.2 % (ref 11.5–15.5)
WBC: 23.8 10*3/uL — ABNORMAL HIGH (ref 4.0–10.5)

## 2012-06-01 MED ORDER — PHENYLEPHRINE HCL 10 MG/ML IJ SOLN
30.0000 ug/min | INTRAVENOUS | Status: DC
  Administered 2012-06-01: 30 ug/min via INTRAVENOUS
  Administered 2012-06-01: 40 ug/min via INTRAVENOUS
  Administered 2012-06-02: 20 ug/min via INTRAVENOUS
  Filled 2012-06-01 (×3): qty 2

## 2012-06-01 MED ORDER — MAGNESIUM SULFATE BOLUS VIA INFUSION
2.0000 g | Freq: Once | INTRAVENOUS | Status: DC
  Filled 2012-06-01: qty 500

## 2012-06-01 MED ORDER — POTASSIUM CHLORIDE CRYS ER 20 MEQ PO TBCR
40.0000 meq | EXTENDED_RELEASE_TABLET | Freq: Three times a day (TID) | ORAL | Status: AC
  Administered 2012-06-01 (×2): 40 meq via ORAL
  Filled 2012-06-01: qty 2

## 2012-06-01 NOTE — Progress Notes (Signed)
Name: [name] MRN: [id number] DOB: 07-18-1924    LOS: 3  Referring Provider:  [facility] Reason for Referral:  Hypotension  PULMONARY / CRITICAL CARE MEDICINE  HPI:  78 yo with heart failure and atrial fibrillation presenting on 7/31 with epigastric abdominal pain and admitted for treatment of UTI.  Apparently was taking antibiotics at home for persistent UTI. She was started on Ciprofloxacin.  In spite of that her white blood count kept increasing and she developed bloody diarrhea, hypotension and atrial fibrillation with RVR.  Had limited response to fluids.  Heart rate improved once on Amiodarone infusion.  Transferred to ICU for farther management.  Past Medical History  Diagnosis Date  . CHF (congestive heart failure)   . Hypertension   . Atrial fibrillation   . Peripheral neuropathy   . Asthma   . High cholesterol   . Type II diabetes mellitus   . PAD (peripheral artery disease)   . Enlarged heart   . Anginal pain 05/28/12  . Pneumonia 11/2010  . Sleep apnea   . Hypothyroidism   . Headache 05/29/12    "recently"  . Stroke 05/2005; 08/2010    !mini"  . Chronic kidney disease     "something on labs always say she has some problems"  . Personal history of gout    Past Surgical History  Procedure Date  . Tonsillectomy and adenoidectomy 1960's  . Dilation and curettage of uterus   . Vaginal hysterectomy 1970's  . Total knee arthroplasty 1970-~2002    left; right  . Cataract extraction w/ intraocular lens  implant, bilateral ?1970's   Prior to Admission medications   Medication Sig Start Date End Date Taking? Authorizing Provider  allopurinol (ZYLOPRIM) 300 MG tablet Take 300 mg by mouth daily.   Yes Historical Provider, MD  aspirin EC 81 MG tablet Take 81 mg by mouth daily.   Yes Historical Provider, MD  atorvastatin (LIPITOR) 10 MG tablet Take 10 mg by mouth daily.   Yes Historical Provider, MD  Calcium Carb-Cholecalciferol (CALCIUM + D3) 600-200 MG-UNIT TABS Take 1  tablet by mouth daily.   Yes Historical Provider, MD  dabigatran (PRADAXA) 75 MG CAPS Take 75 mg by mouth every 12 (twelve) hours.   Yes Historical Provider, MD  diltiazem (TIAZAC) 240 MG 24 hr capsule Take 240 mg by mouth daily.   Yes Historical Provider, MD  furosemide (LASIX) 80 MG tablet Take 80 mg by mouth daily.   Yes Historical Provider, MD  gabapentin (NEURONTIN) 300 MG capsule Take 300 mg by mouth at bedtime.   Yes Historical Provider, MD  Garlic (GARLIQUE) 400 MG TBEC Take 1 tablet by mouth daily.   Yes Historical Provider, MD  glipiZIDE (GLUCOTROL XL) 5 MG 24 hr tablet Take 5 mg by mouth daily.   Yes Historical Provider, MD  levofloxacin (LEVAQUIN) 500 MG tablet Take 500 mg by mouth daily. Started 05/17/12   Yes Historical Provider, MD  levothyroxine (SYNTHROID, LEVOTHROID) 88 MCG tablet Take 88 mcg by mouth daily.   Yes Historical Provider, MD  Multiple Vitamins-Minerals (MULTIVITAMIN WITH MINERALS) tablet Take 1 tablet by mouth daily.   Yes Historical Provider, MD  Omega-3 Fatty Acids (FISH OIL) 1000 MG CAPS Take 1 capsule by mouth daily.   Yes Historical Provider, MD  omeprazole (PRILOSEC) 20 MG capsule Take 20 mg by mouth daily.   Yes Historical Provider, MD  potassium chloride (KLOR-CON) 8 MEQ tablet Take 8 mEq by mouth 2 (two) times daily. For  7 days. Started 05/28/12   Yes Historical Provider, MD  PRESCRIPTION MEDICATION Take 1 tablet by mouth 2 (two) times daily. Methylfolate/Calcium   Yes Historical Provider, MD  Probiotic Product (PROBIOTIC DAILY PO) Take 1 tablet by mouth daily.   Yes Historical Provider, MD  vitamin C (ASCORBIC ACID) 500 MG tablet Take 500 mg by mouth daily.   Yes Historical Provider, MD  zaleplon (SONATA) 5 MG capsule Take 5 mg by mouth at bedtime.   Yes Historical Provider, MD   Allergies Allergies  Allergen Reactions  . Aspirin Other (See Comments)    Tears my stomach up; "can take coated Aspirin qd without problems"  . Nexium (Esomeprazole Magnesium)  Rash  . Penicillins Rash   Family History History reviewed. No pertinent family history. Social History  reports that she has never smoked. She has never used smokeless tobacco. She reports that she does not drink alcohol or use illicit drugs.  Review Of Systems:  Reports being tired and having abdominal discomfort.  Otherwise negative.  Brief patient description:   78 yo with systolic heart failure and atrial fibrillation presenting on 7/31 with epigastric abdominal pain and admitted for treatment of UTI.  Apparently was taking antibiotics at home for persistent UTI. She was started on Ciprofloxacin.  In spite of that her white blood count kept increasing and she developed bloody diarrhea, hypotension and atrial fibrillation with RVR.  Had limited response to fluids.  Heart rate improved once on Amiodarone infusion.  Transferred to ICU for farther management.  Events Since Admission: 7/31  Admitted with epigastric pain, treated for UTI 8/2    Transferred to ICU for hypotension and afib with RVR  Current Status:  Vital Signs: Temp:  [97.8 F (36.6 C)-98.9 F (37.2 C)] 98.2 F (36.8 C) (08/03 0400) Pulse Rate:  [67-119] 85  (08/03 0800) Resp:  [15-29] 21  (08/03 0800) BP: (90-114)/(36-73) 92/56 mmHg (08/03 0800) SpO2:  [92 %-99 %] 97 % (08/03 0800) Weight:  [84.1 kg (185 lb 6.5 oz)] 84.1 kg (185 lb 6.5 oz) (08/03 0500)  Physical Examination: General:  Somewhat uncomfortable Neuro:  Awake, alert, oriented HEENT:  PERRL Neck:  No JVD Cardiovascular:  Irregular heart rate Lungs:  Bilateral diminished air entry Abdomen:  Diffuse tenderness, no rebound, diminished bowel sounds Musculoskeletal:  Moves all extremities Skin:  Bruising on posterior trunk  Principal Problem:  *UTI (lower urinary tract infection) Active Problems:  Chest pain  Vomiting  Constipation  Atrial fibrillation  CHF (congestive heart failure)  Thyroid disease  Hypokalemia  Hyponatremia  Sepsis   Ischemic necrosis of small bowel  C. difficile colitis  ASSESSMENT AND PLAN  PULMONARY No results found for this basename: PHART:5,PCO2:5,PCO2ART:5,PO2ART:5,HCO3:5,O2SAT:5 in the last 168 hours  Ventilator Settings:    CXR:  7/31 >>> nad ETT:  NA  A:  No active issues. P:   DNR status  CARDIOVASCULAR  Lab 05/31/12 1237 05/31/12 1005 05/31/12 0710 05/31/12 0227 05/30/12 2135 05/30/12 2134 05/30/12 0853 05/30/12 0133 05/29/12 1711  TROPONINI -- -- -- -- -- <0.30 <0.30 <0.30 <0.30  LATICACIDVEN 2.6* 2.5* 2.8* -- 4.4* -- -- -- --  PROBNP -- -- -- 2404.0* -- -- -- -- 1621.0*   ECG:  7/31 >>> Atrial fibrillation, RBBB Lines: NA  A: Atrial fibrillation with rapid ventricular response.  SIRS / Sepsis.  Marginal response to fluids but MAP > 65 on minimal Neo-Synephrine. Congestive heart failure by history (no recent TTE, EF 60% in 2011).  No evidence of  ischemia. P:  TTE result pending. Amiodarone gtt. Neo-Synephrine via PIV for now only at 30 mcg and coming down, no need for TLC. May need CVL Goal MAP > 60  RENAL  Lab 06/01/12 0525 05/31/12 0740 05/31/12 0227 05/30/12 2134 05/30/12 1124 05/30/12 0133 05/29/12 1340  NA 127* -- 133* -- 132* 133* 131*  K 3.3* -- 3.9 -- -- -- --  CL 97 -- 99 -- 91* 93* 90*  CO2 22 -- 23 -- 32 32 31  BUN 16 -- 20 -- 14 17 18   CREATININE 0.86 -- 1.16* -- 0.97 0.96 1.05  CALCIUM 7.4* -- 7.0* -- 7.4* 7.2* 8.1*  MG 1.9 1.3* -- 1.5 -- 1.4* --  PHOS 2.6 -- -- -- -- -- --   Intake/Output      08/02 0701 - 08/03 0700 08/03 0701 - 08/04 0700   P.O.     I.V. (mL/kg) 3466.3 (41.2) 146.7 (1.7)   IV Piggyback 350    Total Intake(mL/kg) 3816.3 (45.4) 146.7 (1.7)   Urine (mL/kg/hr)     Stool 4    Total Output 4    Net +3812.3 +146.7        Urine Occurrence 3 x    Stool Occurrence 3 x     Foley:  NA  Intake/Output Summary (Last 24 hours) at 06/01/12 1013 Last data filed at 06/01/12 0805  Gross per 24 hour  Intake 3612.9 ml  Output      3 ml    Net 3609.9 ml   A:  Acute renal failure / AKI vs prerenal. Hypomagnesemia, supplemented 8/1. P:   Trend BMP. Replace Mg and potassium. Goal K >4, Mg > 2. KVO IVF  GASTROINTESTINAL  Lab 05/31/12 0227 05/29/12 1340  AST 21 36  ALT 15 17  ALKPHOS 75 86  BILITOT 0.3 0.5  PROT 3.8* 5.4*  ALBUMIN 1.5* 2.2*   A:  Abdominal pain.  Findings suggestive of possible mesenteric ischemia and partial SBO on abdominal CT 7/23.  Concern for ischemic bowel vs pseudomembranous colitis.  Severe malnutrition, P:   Repeat abdomin / pelvis CT (oral contrast only) with bowel edema, likely infectious but ? Of ischemic changes still exists, no need for additional intervetions. Heart healthy diet. Amylase / lipase negative.  HEMATOLOGIC  Lab 06/01/12 0525 05/31/12 0227 05/30/12 2101 05/29/12 1436 05/29/12 1340  HGB 10.5* 11.0* 13.3 12.3 --  HCT 31.1* 32.2* 38.4 34.7* --  PLT 219 231 265 262 --  INR -- -- -- -- 1.43  APTT -- -- -- -- 42*   A:  Anemia, blood loss vs dilutional.  On Pradaxa until today.  History of fall.  Concern for retroperitoneal hemorrhage. P:  Trend CBC Pradaxa held  INFECTIOUS  Lab 06/01/12 0525 05/31/12 0740 05/31/12 0227 05/30/12 2101 05/29/12 1436  WBC 23.8* -- 39.8* 30.6* 11.7*  PROCALCITON -- 8.22 -- -- --   Cultures: 8/2    Blood >>> 7/31  Urine >>> E.coli resistant to quinilones and bactrim  Antibiotics: Cipro 7/31 >>> 8/1 Flagyl 8/1 >>>8/3 Primaxin 8/2 >>> Vancomycin 8/2 >>>8/3  A:  UTI with E.Coli.  That is resistant to quinilones and bactrim P:   Trend CBC PCT 8.22 C.dif negative. D/C flagyl and vanc.  ENDOCRINE  Lab 06/01/12 0814 06/01/12 0339 05/31/12 2339 05/31/12 1925 05/31/12 1716  GLUCAP 97 149* 170* 153* 120*   A:  DM.  Hyperglycemia.  Hypothyroidism.  Unknown adrenal function. P:   SSI/CBG. Cortisol level 22.6, will continue  stress dose steroids while on pressors. Levothyroxine.  NEUROLOGIC  A:  No active issues. P:   No  interventions required Neurontin Ambien  Koren Bound, M.D. Pulmonary and Critical Care Medicine [facility] Pager: [phone]  06/01/2012, 10:06 AM

## 2012-06-02 ENCOUNTER — Inpatient Hospital Stay (HOSPITAL_COMMUNITY): Payer: Medicare Other

## 2012-06-02 DIAGNOSIS — R079 Chest pain, unspecified: Secondary | ICD-10-CM

## 2012-06-02 LAB — GLUCOSE, CAPILLARY
Glucose-Capillary: 74 mg/dL (ref 70–99)
Glucose-Capillary: 146 mg/dL — ABNORMAL HIGH (ref 70–99)

## 2012-06-02 LAB — BASIC METABOLIC PANEL
BUN: 11 mg/dL (ref 6–23)
CO2: 23 mEq/L (ref 19–32)
Calcium: 7.9 mg/dL — ABNORMAL LOW (ref 8.4–10.5)
Creatinine, Ser: 0.77 mg/dL (ref 0.50–1.10)
Glucose, Bld: 131 mg/dL — ABNORMAL HIGH (ref 70–99)

## 2012-06-02 LAB — CBC
MCH: 33.3 pg (ref 26.0–34.0)
MCHC: 34.6 g/dL (ref 30.0–36.0)
MCV: 96.2 fL (ref 78.0–100.0)
Platelets: 225 10*3/uL (ref 150–400)
RDW: 15.7 % — ABNORMAL HIGH (ref 11.5–15.5)

## 2012-06-02 MED ORDER — LEVALBUTEROL HCL 0.63 MG/3ML IN NEBU
0.6300 mg | INHALATION_SOLUTION | Freq: Four times a day (QID) | RESPIRATORY_TRACT | Status: DC
  Administered 2012-06-02 – 2012-06-03 (×6): 0.63 mg via RESPIRATORY_TRACT
  Filled 2012-06-02 (×12): qty 3

## 2012-06-02 MED ORDER — CHLORHEXIDINE GLUCONATE 0.12 % MT SOLN
15.0000 mL | Freq: Two times a day (BID) | OROMUCOSAL | Status: DC
Start: 2012-06-02 — End: 2012-06-02

## 2012-06-02 MED ORDER — ALBUTEROL SULFATE (5 MG/ML) 0.5% IN NEBU
2.5000 mg | INHALATION_SOLUTION | Freq: Once | RESPIRATORY_TRACT | Status: AC
  Administered 2012-06-02: 2.5 mg via RESPIRATORY_TRACT
  Filled 2012-06-02: qty 0.5

## 2012-06-02 MED ORDER — INSULIN ASPART 100 UNIT/ML ~~LOC~~ SOLN
0.0000 [IU] | Freq: Three times a day (TID) | SUBCUTANEOUS | Status: DC
  Administered 2012-06-03: 2 [IU] via SUBCUTANEOUS
  Administered 2012-06-03: 1 [IU] via SUBCUTANEOUS
  Administered 2012-06-04: 2 [IU] via SUBCUTANEOUS
  Administered 2012-06-05: 13:00:00 via SUBCUTANEOUS
  Administered 2012-06-06: 2 [IU] via SUBCUTANEOUS
  Administered 2012-06-06: 1 [IU] via SUBCUTANEOUS

## 2012-06-02 MED ORDER — BIOTENE DRY MOUTH MT LIQD
15.0000 mL | Freq: Four times a day (QID) | OROMUCOSAL | Status: DC
  Administered 2012-06-02 – 2012-06-06 (×13): 15 mL via OROMUCOSAL

## 2012-06-02 NOTE — Progress Notes (Signed)
eLink Physician-Brief Progress Note Patient Name: [name] DOB: [birth year] MRN: [id number]  Date of Service  06/02/2012   HPI/Events of Note   Some wheezing, no new film, pos balance  eICU Interventions  Assess pcxr for edema, add neb for now      Nelda Bucks. 06/02/2012, 6:26 AM

## 2012-06-02 NOTE — Progress Notes (Signed)
eLink Physician-Brief Progress Note Patient Name: [name] DOB: 1923/12/12 MRN: [id number]  Date of Service  06/02/2012   HPI/Events of Note   Ongoing wheezing  eICU Interventions  Xopenex BD ordered   Intervention Category Major Interventions: Respiratory failure - evaluation and management  Shan Levans 06/02/2012, 3:41 PM

## 2012-06-02 NOTE — Progress Notes (Signed)
Name: [name] MRN: [id number] DOB: 04-Jan-1924    LOS: 4  Referring Provider:  [facility] Reason for Referral:  Hypotension  PULMONARY / CRITICAL CARE MEDICINE  HPI:  78 yo with heart failure and atrial fibrillation presenting on 7/31 with epigastric abdominal pain and admitted for treatment of UTI.  Apparently was taking antibiotics at home for persistent UTI. She was started on Ciprofloxacin.  In spite of that her white blood count kept increasing and she developed bloody diarrhea, hypotension and atrial fibrillation with RVR.  Had limited response to fluids.  Heart rate improved once on Amiodarone infusion.  Transferred to ICU for farther management.   Brief patient description:   78 yo with systolic heart failure and atrial fibrillation presenting on 7/31 with epigastric abdominal pain and admitted for treatment of UTI.  Apparently was taking antibiotics at home for persistent UTI. She was started on Ciprofloxacin.  In spite of that her white blood count kept increasing and she developed bloody diarrhea, hypotension and atrial fibrillation with RVR.  Had limited response to fluids.  Heart rate improved once on Amiodarone infusion.  Transferred to ICU for farther management.  Events Since Admission: 7/31  Admitted with epigastric pain, treated for UTI 8/2    Transferred to ICU for hypotension and afib with RVR  Current Status:  Vital Signs: Temp:  [97.9 F (36.6 C)-98.3 F (36.8 C)] 98 F (36.7 C) (08/04 0800) Pulse Rate:  [72-141] 93  (08/04 1000) Resp:  [14-25] 19  (08/04 1000) BP: (98-126)/(42-91) 108/50 mmHg (08/04 1000) SpO2:  [94 %-100 %] 99 % (08/04 1000) Weight:  [193 lb 2 oz (87.6 kg)] 193 lb 2 oz (87.6 kg) (08/04 0600)  Physical Examination: General:  NAD Neuro:  Awake, alert, oriented HEENT:  PERRL Neck:  No JVD Cardiovascular:  Irregular heart rate, on amio, neo Lungs:  Bilateral diminished air entry Abdomen:  Diffuse tenderness, no rebound, diminished bowel  sounds Musculoskeletal:  Moves all extremities Skin:  Bruising on posterior trunk  Principal Problem:  *UTI (lower urinary tract infection) Active Problems:  Chest pain  Vomiting  Constipation  Atrial fibrillation  CHF (congestive heart failure)  Thyroid disease  Hypokalemia  Hyponatremia  Sepsis  Ischemic necrosis of small bowel  C. difficile colitis  ASSESSMENT AND PLAN  PULMONARY No results found for this basename: PHART:5,PCO2:5,PCO2ART:5,PO2ART:5,HCO3:5,O2SAT:5 in the last 168 hours  Ventilator Settings:    CXR:   Dg Chest Port 1 View  06/02/2012  *RADIOLOGY REPORT*  Clinical Data: Evaluate for pulmonary edema.  PORTABLE CHEST - 1 VIEW  Comparison: Chest x-ray 05/29/2012.  Findings: There are extensive bibasilar opacities (left greater than right) which may represent areas of atelectasis and/or consolidation, likely with superimposed small bilateral pleural effusions (left greater than right).  Minimal pulmonary venous engorgement without frank pulmonary edema.  Mild cardiomegaly is unchanged.  Extensive calcifications of the mitral annulus. Atherosclerosis in the thoracic aorta.  Upper mediastinal contours are within normal limits.  IMPRESSION: 1.  Mild cardiomegaly with mild pulmonary venous congestion, but no frank pulmonary edema. 2.  Extensive bibasilar opacities (left greater than right), compatible with areas of atelectasis and/or consolidation, with superimposed bilateral pleural effusions (left greater than right). 3.  Atherosclerosis.  Original Report Authenticated By: Florencia Reasons, M.D.    ETT:  NA  A:  Hx of OSA P:   -add cpap  CARDIOVASCULAR  Lab 05/31/12 1237 05/31/12 1005 05/31/12 0710 05/31/12 2952 05/30/12 2135 05/30/12 2134 05/30/12 8413 05/30/12 0133 05/29/12 1711  TROPONINI -- -- -- -- -- <0.30 <0.30 <0.30 <0.30  LATICACIDVEN 2.6* 2.5* 2.8* -- 4.4* -- -- -- --  PROBNP -- -- -- 2404.0* -- -- -- -- 1621.0*   ECG:  7/31 >>> Atrial fibrillation,  RBBB Lines: NA  A: Atrial fibrillation with rapid ventricular response.  SIRS / Sepsis.  Marginal response to fluids but MAP > 65 on minimal Neo-Synephrine. Congestive heart failure by history (no recent TTE, EF 60% in 2011).  No evidence of ischemia. P:  TTE .ejection fraction was in the range of 50% to 55%. Wall motion was normal; Amiodarone gtt. Neo-Synephrine via PIV for now only at 30 mcg and coming down, no need for TLC. May need CVL Goal MAP > 60  RENAL  Lab 06/02/12 0515 06/01/12 0525 05/31/12 0740 05/31/12 0227 05/30/12 2134 05/30/12 1124 05/30/12 0133  NA 133* 127* -- 133* -- 132* 133*  K 5.0 3.3* -- -- -- -- --  CL 104 97 -- 99 -- 91* 93*  CO2 23 22 -- 23 -- 32 32  BUN 11 16 -- 20 -- 14 17  CREATININE 0.77 0.86 -- 1.16* -- 0.97 0.96  CALCIUM 7.9* 7.4* -- 7.0* -- 7.4* 7.2*  MG 1.8 1.9 1.3* -- 1.5 -- 1.4*  PHOS 2.1* 2.6 -- -- -- -- --   Intake/Output      08/03 0701 - 08/04 0700 08/04 0701 - 08/05 0700   P.O. 720    I.V. (mL/kg) 1005.8 (11.5) 114.3 (1.3)   IV Piggyback     Total Intake(mL/kg) 1725.8 (19.7) 114.3 (1.3)   Urine (mL/kg/hr) 403 (0.2)    Stool 5    Total Output 408    Net +1317.8 +114.3        Urine Occurrence 2 x    Stool Occurrence 4 x     Foley:  NA  Intake/Output Summary (Last 24 hours) at 06/02/12 1042 Last data filed at 06/02/12 1024  Gross per 24 hour  Intake 1479.98 ml  Output    405 ml  Net 1074.98 ml   A:  Acute renal failure / AKI vs prerenal. Hypomagnesemia, supplemented 8/1. P:   Trend BMP. Replace Mg and potassium. Goal K >4, Mg > 2. KVO IVF  GASTROINTESTINAL  Lab 05/31/12 0227 05/29/12 1340  AST 21 36  ALT 15 17  ALKPHOS 75 86  BILITOT 0.3 0.5  PROT 3.8* 5.4*  ALBUMIN 1.5* 2.2*   A:  Abdominal pain.  Findings suggestive of possible mesenteric ischemia and partial SBO on abdominal CT 7/23.  Concern for ischemic bowel vs pseudomembranous colitis.  Severe malnutrition, P:   Repeat abdomin / pelvis CT (oral contrast  only) with bowel edema, likely infectious but ? Of ischemic changes still exists, no need for additional intervetions. Heart healthy diet. Amylase / lipase negative.  HEMATOLOGIC  Lab 06/02/12 0515 06/01/12 0525 05/31/12 0227 05/30/12 2101 05/29/12 1436 05/29/12 1340  HGB 11.5* 10.5* 11.0* 13.3 12.3 --  HCT 33.2* 31.1* 32.2* 38.4 34.7* --  PLT 225 219 231 265 262 --  INR -- -- -- -- -- 1.43  APTT -- -- -- -- -- 42*   A:  Anemia, blood loss vs dilutional.  On Pradaxa until today.  History of fall.  Concern for retroperitoneal hemorrhage. P:  Trend CBC Pradaxa held  INFECTIOUS  Lab 06/02/12 0515 06/01/12 0525 05/31/12 0740 05/31/12 0227 05/30/12 2101 05/29/12 1436  WBC 13.4* 23.8* -- 39.8* 30.6* 11.7*  PROCALCITON -- -- 8.22 -- -- --  Cultures: 8/2    Blood >>> 7/31  Urine >>> E.coli resistant to quinilones and bactrim  Antibiotics: Cipro 7/31 >>> 8/1 Flagyl 8/1 >>>8/3 Primaxin 8/2 >>> Vancomycin 8/2 >>>8/3  A:  UTI with E.Coli.  That is resistant to quinilones and bactrim P:   Trend CBC PCT 8.22 C.dif negative. D/C flagyl and vanc.  ENDOCRINE  Lab 06/02/12 0801 06/02/12 0412 06/01/12 2341 06/01/12 1943 06/01/12 1629  GLUCAP 74 227* 166* 153* 138*   A:  DM.  Hyperglycemia.  Hypothyroidism.  Unknown adrenal function. P:   SSI/CBG. Cortisol level 22.6, will continue stress dose steroids while on pressors. Levothyroxine.  NEUROLOGIC  A:  No active issues. P:   No interventions required Neurontin Ambien  Brett Canales Minor ACNP [facility] Pager [phone] till 3 pm If no answer page [phone] 06/02/2012, 10:43 AM  CC time 35 min.  Confirmed code status with family, all questions answered.  Patient seen and examined, agree with above note.  I dictated the care and orders written for this patient under my direction.  Koren Bound, M.D. [phone]

## 2012-06-02 NOTE — Progress Notes (Signed)
Physical Therapy Evaluation Patient Details Name: [name] MRN: [id number] DOB: Feb 03, 1924 Today's Date: 06/02/2012 Time: 4696-2952 PT Time Calculation (min): 26 min  PT Assessment / Plan / Recommendation Clinical Impression  78 yo female admitted with weakness, UTI, then diarrhea, and afib with RVR and hypotension presents with decr activity tolerance, decr functional mobility;   Will benefit from acute PT to very gently increase activity, facilitate dc planning with eventual goal of dc back to home with 24 hour assist;   Will likely need post-acute Rehab to maximize safety with mobility    PT Assessment  Patient needs continued PT services    Follow Up Recommendations  Inpatient Rehab    Barriers to Discharge Inaccessible home environment 8 steps to enter home    Equipment Recommendations  Defer to next venue    Recommendations for Other Services Rehab consult (While it is quite early to be considering a Rehab venue, it is worth looking into options early, and if pt may be an Inpatient Rehab candidate, would like for Rehab to review her case)   Frequency Min 3X/week    Precautions / Restrictions Precautions Precautions: Fall Precaution Comments: Pt became tachycardic with minimal EOB activity Restrictions Other Position/Activity Restrictions: Mobilize very gently with respect to vitals   Pertinent Vitals/Pain Reports she "hurts all over"; she did not rate her pain  Pt's HR tended to stay 110 to 130s during session, but increased to 150 (observed max)while sitting EOB, so we assisted her back to supine    BP 118/88 sitting EOB      Mobility  Bed Mobility Bed Mobility: Rolling Right;Rolling Left;Left Sidelying to Sit;Sit to Supine Rolling Right: 3: Mod assist;With rail (heavy mod assist) Rolling Left: 3: Mod assist;With rail (heavy mod assist) Left Sidelying to Sit: 1: +2 Total assist;With rails;HOB elevated Left Sidelying to Sit: Patient Percentage: 40% Sit  to Supine: 1: +2 Total assist;With rail Sit to Supine: Patient Percentage: 40% Details for Bed Mobility Assistance: Cues for technique; Noted slow to respond to cues, and grimace with any motion; When asked, pt states she "hurts all over" Transfers Transfers: Not assessed (tachycardic sitting EOB, so opted to lay back down) Ambulation/Gait Ambulation/Gait Assistance: Not tested (comment) (Tachy sitting EOB)    Exercises     PT Diagnosis: Generalized weakness  PT Problem List: Decreased strength;Decreased activity tolerance;Decreased balance;Decreased mobility;Decreased knowledge of use of DME;Decreased knowledge of precautions;Cardiopulmonary status limiting activity;Pain PT Treatment Interventions: DME instruction;Gait training;Stair training;Functional mobility training;Therapeutic activities;Therapeutic exercise;Balance training;Patient/family education   PT Goals Acute Rehab PT Goals PT Goal Formulation: With patient/family Time For Goal Achievement: 06/16/12 Potential to Achieve Goals: Fair Pt will Roll Supine to Right Side: with modified independence;with rail PT Goal: Rolling Supine to Right Side - Progress: Goal set today Pt will Roll Supine to Left Side: with modified independence;with rail PT Goal: Rolling Supine to Left Side - Progress: Goal set today Pt will go Supine/Side to Sit: with supervision PT Goal: Supine/Side to Sit - Progress: Goal set today Pt will Sit at Edge of Bed: with supervision PT Goal: Sit at Edge Of Bed - Progress: Goal set today Pt will go Sit to Supine/Side: with supervision PT Goal: Sit to Supine/Side - Progress: Goal set today Pt will go Sit to Stand: with min assist PT Goal: Sit to Stand - Progress: Goal set today Pt will go Stand to Sit: with min assist PT Goal: Stand to Sit - Progress: Goal set today Pt will Transfer Bed to Chair/Chair  to Bed: with min assist PT Transfer Goal: Bed to Chair/Chair to Bed - Progress: Goal set today Pt will  Ambulate: 51 - 150 feet;with min assist;with rolling walker PT Goal: Ambulate - Progress: Goal set today  Visit Information  Last PT Received On: 06/02/12 Assistance Needed: +2    Subjective Data  Subjective: Agreeable to try to get to [facility]; Reports she "hurts all over" Patient Stated Goal: get better   Prior Functioning  Home Living Lives With: Alone Available Help at Discharge: Personal care attendant;Available 24 hours/day (per pt's daughter) Type of Home: House Home Access: Stairs to enter [employer] of Steps: 8 (Daughter reports family is concerned about number of steps) Entrance Stairs-Rails:  (to be determined) Home Layout: One level Bathroom Shower/Tub: Walk-in shower Home Adaptive Equipment: Environmental consultant - four wheeled;Grab bars in shower (Need more comprehensive info) Prior Function Level of Independence: Needs assistance Needs Assistance: Bathing;Dressing;Light Housekeeping;Meal Prep;Toileting (Will need more info) Able to Take Stairs?: Yes (with assistance) Driving: No Communication Communication: No difficulties    Cognition  Overall Cognitive Status: Appears within functional limits for tasks assessed/performed Arousal/Alertness: Awake/alert Orientation Level: Appears intact for tasks assessed Behavior During Session: [facility] for tasks performed    Extremity/Trunk Assessment Right Upper Extremity Assessment RUE ROM/Strength/Tone: Deficits RUE ROM/Strength/Tone Deficits: Generalized weakness Left Upper Extremity Assessment LUE ROM/Strength/Tone: Deficits LUE ROM/Strength/Tone Deficits: Generalized weakness Right Lower Extremity Assessment RLE ROM/Strength/Tone: Deficits RLE ROM/Strength/Tone Deficits: Generalized weakness Left Lower Extremity Assessment LLE ROM/Strength/Tone: Deficits LLE ROM/Strength/Tone Deficits: Generalized weakness   Balance Balance Balance Assessed: Yes Static Sitting Balance Static Sitting - Balance Support: Right upper  extremity supported;Left upper extremity supported;Feet supported Static Sitting - Level of Assistance: 3: Mod assist Static Sitting - Comment/# of Minutes: EOB apprx 3 minutes; Pt with difficulty acheiving fully upright sitting, as she tends to lean left, and has difficulty pushing through LUE for upright sitting; Session ended and pt helped back to supine (and back onto bedpan) when her HR reached 150 (observed max)  End of Session PT - End of Session Equipment Utilized During Treatment:  (bed pad) Activity Tolerance: Patient limited by fatigue;Treatment limited secondary to medical complications (Comment) (became tachycardic sitting EOB) Patient left: in bed;with call bell/phone within reach;with nursing in room;with family/visitor present (Back onto bedpan (where pt was when session began)) Nurse Communication: Mobility status  GP     Van Clines [facility] [place], [place] [phone]  06/02/2012, 3:40 PM

## 2012-06-03 ENCOUNTER — Inpatient Hospital Stay (HOSPITAL_COMMUNITY): Payer: Medicare Other

## 2012-06-03 LAB — GLUCOSE, CAPILLARY: Glucose-Capillary: 187 mg/dL — ABNORMAL HIGH (ref 70–99)

## 2012-06-03 LAB — BASIC METABOLIC PANEL
BUN: 11 mg/dL (ref 6–23)
Creatinine, Ser: 0.79 mg/dL (ref 0.50–1.10)
Glucose, Bld: 114 mg/dL — ABNORMAL HIGH (ref 70–99)
Sodium: 127 mEq/L — ABNORMAL LOW (ref 135–145)

## 2012-06-03 LAB — CBC
MCH: 32.6 pg (ref 26.0–34.0)
Platelets: 181 10*3/uL (ref 150–400)
RBC: 3.65 MIL/uL — ABNORMAL LOW (ref 3.87–5.11)

## 2012-06-03 MED ORDER — FUROSEMIDE 10 MG/ML IJ SOLN
40.0000 mg | Freq: Once | INTRAMUSCULAR | Status: AC
  Administered 2012-06-03: 40 mg via INTRAVENOUS

## 2012-06-03 MED ORDER — AMIODARONE HCL 200 MG PO TABS
200.0000 mg | ORAL_TABLET | Freq: Every day | ORAL | Status: DC
  Administered 2012-06-03 – 2012-06-06 (×4): 200 mg via ORAL
  Filled 2012-06-03 (×4): qty 1

## 2012-06-03 MED ORDER — SACCHAROMYCES BOULARDII 250 MG PO CAPS: 250.0000 mg | ORAL_CAPSULE | Freq: Two times a day (BID) | ORAL | Status: DC

## 2012-06-03 MED ORDER — RISAQUAD PO CAPS
1.0000 | ORAL_CAPSULE | Freq: Two times a day (BID) | ORAL | Status: DC
  Administered 2012-06-03 – 2012-06-06 (×7): 1 via ORAL
  Filled 2012-06-03 (×8): qty 1

## 2012-06-03 NOTE — Progress Notes (Signed)
Assisted pt. With setting up home CPAP. CPAP didn't have any frayed wires. 3L O2 was bled in & pt. Was placed on CPAP with nasal mask. Pt. Is tolerating CPAP well at this time. All vitals are within normal limits.

## 2012-06-03 NOTE — Progress Notes (Signed)
ANTIBIOTIC CONSULT NOTE - Follow Up  Pharmacy Consult for Primaxin - - - Day # 4 Indication: rule out sepsis  Allergies  Allergen Reactions  . Aspirin Other (See Comments)    Tears my stomach up; "can take coated Aspirin qd without problems"  . Nexium (Esomeprazole Magnesium) Rash  . Penicillins Rash   Patient Measurements: Height: 5\' 7"  (170.2 cm) Weight: 196 lb 10.4 oz (89.2 kg) IBW/kg (Calculated) : 61.6   Vital Signs: Temp: 98.4 F (36.9 C) (08/05 0800) Temp src: Oral (08/05 0800) BP: 124/67 mmHg (08/05 1000) Pulse Rate: 89  (08/05 1000)  Labs:  Basename 06/03/12 0545 06/02/12 0515 06/01/12 0525  WBC 10.9* 13.4* 23.8*  HGB 11.9* 11.5* 10.5*  PLT 181 225 219  LABCREA -- -- --  CREATININE 0.79 0.77 0.86   Estimated Creatinine Clearance: 58.9 ml/min (by C-G formula based on Cr of 0.79).  Microbiology: Recent Results (from the past 720 hour(s))  URINE CULTURE     Status: Normal   Collection Time   05/29/12  2:08 PM      Component Value Range Status Comment   Specimen Description URINE, CLEAN CATCH   Final    Special Requests ADDED 05/29/12 1729   Final    Culture  Setup Time 05/29/2012 17:52   Final    Colony Count >=100,000 COLONIES/ML   Final    Culture ESCHERICHIA COLI   Final    Report Status 05/31/2012 FINAL   Final    Organism ID, Bacteria ESCHERICHIA COLI   Final   MRSA PCR SCREENING     Status: Normal   Collection Time   05/30/12 10:36 PM      Component Value Range Status Comment   MRSA by PCR NEGATIVE  NEGATIVE Final   CULTURE, BLOOD (ROUTINE X 2)     Status: Normal (Preliminary result)   Collection Time   05/31/12 12:29 AM      Component Value Range Status Comment   Specimen Description BLOOD LEFT ARM   Final    Special Requests BOTTLES DRAWN AEROBIC AND ANAEROBIC 10CC EACH   Final    Culture  Setup Time 05/31/2012 09:00   Final    Culture     Final    Value:        BLOOD CULTURE RECEIVED NO GROWTH TO DATE CULTURE WILL BE HELD FOR 5 DAYS BEFORE ISSUING  A FINAL NEGATIVE REPORT   Report Status PENDING   Incomplete   CULTURE, BLOOD (ROUTINE X 2)     Status: Normal (Preliminary result)   Collection Time   05/31/12 12:29 AM      Component Value Range Status Comment   Specimen Description BLOOD LEFT HAND   Final    Special Requests BOTTLES DRAWN AEROBIC AND ANAEROBIC 5CC EACH   Final    Culture  Setup Time 05/31/2012 09:00   Final    Culture     Final    Value:        BLOOD CULTURE RECEIVED NO GROWTH TO DATE CULTURE WILL BE HELD FOR 5 DAYS BEFORE ISSUING A FINAL NEGATIVE REPORT   Report Status PENDING   Incomplete   CLOSTRIDIUM DIFFICILE BY PCR     Status: Normal   Collection Time   05/31/12  8:36 AM      Component Value Range Status Comment   C difficile by pcr NEGATIVE  NEGATIVE Final    Medical History: Past Medical History  Diagnosis Date  . CHF (congestive heart  failure)   . Hypertension   . Atrial fibrillation   . Peripheral neuropathy   . Asthma   . High cholesterol   . Type II diabetes mellitus   . PAD (peripheral artery disease)   . Enlarged heart   . Anginal pain 05/28/12  . Pneumonia 11/2010  . Sleep apnea   . Hypothyroidism   . Headache 05/29/12    "recently"  . Stroke 05/2005; 08/2010    !mini"  . Chronic kidney disease     "something on labs always say she has some problems"  . Personal history of gout    Scheduled:     . acidophilus  1 capsule Oral BID  . allopurinol  300 mg Oral Daily  . amiodarone  200 mg Oral Daily  . antiseptic oral rinse  15 mL Mouth Rinse QID  . atorvastatin  10 mg Oral Daily  . famotidine  20 mg Oral QHS  . feeding supplement  1 Container Oral BID BM  . furosemide  40 mg Intravenous Once  . gabapentin  300 mg Oral QHS  . imipenem-cilastatin  500 mg Intravenous Q8H  . insulin aspart  0-9 Units Subcutaneous TID AC  . levalbuterol  0.63 mg Nebulization Q6H  . levothyroxine  88 mcg Oral QAC breakfast  . magnesium  2 g Intravenous Once  . sodium chloride  3 mL Intravenous Q12H  .  zolpidem  5 mg Oral QHS   Assessment: 78yo female admitted for epigastric pain continues to c/o generalized abdominal pain, has been tachycardic to 160s and hypotensive to 70s, to begin IV ABX for signs of early sepsis.  She is now off Vancomycin and receiving IV Imipenem for E.Coli UTI.  She is currently afebrile, her WBC are trending downward and she has no new culture data.  Renal function remains stable with an estimated clearance ~ 60 ml/min.  Goal of Therapy:  Therapeutic response to IV antibiotics.  Plan:   - Will continue Primaxin 500mg  IV Q8H   - Monitor renal function and adjust  - Consider 7 day course of therapy unless you feel this is a complicated UTI.  Nadara Mustard, PharmD., MS Clinical Pharmacist Pager:  [phone]  Thank you for allowing pharmacy to be part of this patients care team. 06/03/2012,11:26 AM

## 2012-06-03 NOTE — Progress Notes (Signed)
Physical Therapy Treatment Patient Details Name: [name] MRN: [id number] DOB: Apr 07, 1924 Today's Date: 06/03/2012 Time: 1125-1210 PT Time Calculation (min): 45 min  PT Assessment / Plan / Recommendation Comments on Treatment Session  Patient s/p UTI, SOB with decr mobiity secondary to decr endurance and poor balance.  Will benefit from continued PT.  Limited treatment due to incr HR.    Follow Up Recommendations  Inpatient Rehab;Supervision/Assistance - 24 hour    Barriers to Discharge        Equipment Recommendations  Defer to next venue    Recommendations for Other Services    Frequency Min 3X/week   Plan Discharge plan remains appropriate;Frequency remains appropriate    Precautions / Restrictions Precautions Precautions: Fall Precaution Comments: Continues with tachycardia but not as high as last treatment Restrictions Weight Bearing Restrictions: No   Pertinent Vitals/Pain HR to 141 bpm, No pain    Mobility  Bed Mobility Bed Mobility: Rolling Right;Right Sidelying to Sit;Sitting - Scoot to [place] of Bed Rolling Right: 3: Mod assist;With rail Rolling Left: Not tested (comment) Right Sidelying to Sit: 3: Mod assist;With rails;HOB elevated Left Sidelying to Sit: Not tested (comment) Sitting - Scoot to Edge of Bed: 3: Mod assist;With rail Sit to Supine: Not Tested (comment) Details for Bed Mobility Assistance: cues for technique.  PAtient could not problem solve sequence to get OOB.  Slow response to cues as well.   Transfers Transfers: Sit to Stand;Stand to Sit;Stand Pivot Transfers Sit to Stand: 3: Mod assist;With upper extremity assist;From bed Stand to Sit: 3: Mod assist;With upper extremity assist;With armrests;To chair/3-in-1 Stand Pivot Transfers: 3: Mod assist Details for Transfer Assistance: Patient somewhat shaky and difficult to achieve full upright stance.  Took incr time to achieve full upright stance.  Patient needed a lot of cuing to step around  for stand pivot transfer without and with RW.  Patient appears to have delayed response to cuing.  Patient stood and pivoted to 3N1. Then she stood and pivoted to recliner.  HR up to 141 bpm with second transfer therefore did not ambulate. Ambulation/Gait Ambulation/Gait Assistance: Not tested (comment) Stairs: No Wheelchair Mobility Wheelchair Mobility: No    PT Goals Acute Rehab PT Goals PT Goal: Supine/Side to Sit - Progress: Progressing toward goal PT Goal: Sit at Edge Of Bed - Progress: Progressing toward goal PT Goal: Sit to Stand - Progress: Progressing toward goal PT Goal: Stand to Sit - Progress: Progressing toward goal PT Transfer Goal: Bed to Chair/Chair to Bed - Progress: Progressing toward goal  Visit Information  Last PT Received On: 06/03/12 Assistance Needed: +2    Subjective Data  Subjective: "I just poot all the time."   Cognition  Overall Cognitive Status: Appears within functional limits for tasks assessed/performed Arousal/Alertness: Awake/alert Orientation Level: Appears intact for tasks assessed Behavior During Session: [facility] for tasks performed    Balance  Static Sitting Balance Static Sitting - Balance Support: Bilateral upper extremity supported;Feet supported Static Sitting - Level of Assistance: 4: Min assist Static Sitting - Comment/# of Minutes: 2 minutes, leans left and posterior. Static Standing Balance Static Standing - Balance Support: Bilateral upper extremity supported;During functional activity Static Standing - Level of Assistance: 4: Min assist Static Standing - Comment/# of Minutes: 1 minute, min assist to be cleaned of BM.  End of Session PT - End of Session Equipment Utilized During Treatment: Gait belt;Oxygen Activity Tolerance: Patient limited by fatigue;Treatment limited secondary to medical complications (Comment) (HR up to 141 bpm) Patient  left: in chair;with call bell/phone within reach;with family/visitor present Nurse  Communication: Mobility status       INGOLD,Khyran Riera 06/03/2012, 1:30 PM [facility] Acute Rehabilitation [phone] (pager)

## 2012-06-03 NOTE — Progress Notes (Signed)
Name: [name] MRN: [id number] DOB: 05-12-1924    LOS: 5  Referring Provider:  [facility] Reason for Referral:  Hypotension  PULMONARY / CRITICAL CARE MEDICINE   Brief patient description:   78 yo with systolic heart failure and atrial fibrillation presenting on 7/31 with epigastric abdominal pain and admitted for treatment of UTI.  Apparently was taking antibiotics at home for persistent UTI. She was started on Ciprofloxacin.  In spite of that her white blood count kept increasing and she developed bloody diarrhea, hypotension and atrial fibrillation with RVR.  Had limited response to fluids.  Heart rate improved once on Amiodarone infusion.  Transferred to ICU for farther management.  Events Since Admission: 7/31  Admitted with epigastric pain, treated for UTI 8/2    Transferred to ICU for hypotension and afib with RVR 8/5  Off phenylephrine, feels well, some diarrhea   Overnight:  Off pressors, some diarrhea  Vital Signs: Temp:  [97.9 F (36.6 C)-99.1 F (37.3 C)] 98.4 F (36.9 C) (08/05 0800) Pulse Rate:  [58-123] 113  (08/05 0900) Resp:  [17-29] 23  (08/05 0900) BP: (93-167)/(50-88) 117/53 mmHg (08/05 0900) SpO2:  [90 %-100 %] 100 % (08/05 0918) Weight:  [89.2 kg (196 lb 10.4 oz)] 89.2 kg (196 lb 10.4 oz) (08/05 0600)  Physical Examination:  Gen: comfortable in bed HEENT: NCAT, PERRL PULM: Wheezing in bases bilaterally CV: Irreg, irreg, no mgr AB: BS+, soft, nontender Ext: trace edema, warm Neuro: A&Ox4, maew  Principal Problem:  *UTI (lower urinary tract infection) Active Problems:  Chest pain  Vomiting  Constipation  Atrial fibrillation  CHF (congestive heart failure)  Thyroid disease  Hypokalemia  Hyponatremia  Sepsis  Ischemic necrosis of small bowel  C. difficile colitis  ASSESSMENT AND PLAN  PULMONARY No results found for this basename: PHART:5,PCO2:5,PCO2ART:5,PO2ART:5,HCO3:5,O2SAT:5 in the last 168 hours  Ventilator Settings:    CXR: 8/5  Worsening pulm edema in bases  ETT:  NA  A:  Hx of OSA P:   -add cpap -lasix x1 8/5  CARDIOVASCULAR  Lab 05/31/12 1237 05/31/12 1005 05/31/12 0710 05/31/12 0227 05/30/12 2135 05/30/12 2134 05/30/12 0853 05/30/12 0133 05/29/12 1711  TROPONINI -- -- -- -- -- <0.30 <0.30 <0.30 <0.30  LATICACIDVEN 2.6* 2.5* 2.8* -- 4.4* -- -- -- --  PROBNP -- -- -- 2404.0* -- -- -- -- 1621.0*   ECG:  7/31 >>> Atrial fibrillation, RBBB Lines: NA  A: Atrial fibrillation with rapid ventricular response, improved control on amiodarone SIRS / Sepsis.  Now off Neo-Synephrine.  Congestive heart failure by history (no recent TTE, EF 60% in 2011) with mild pulm edema 8/5 after fluid resuscitation.  No evidence of ischemia. P:  Convert amiodarone gtt to po 8/5 May need to add back home dilt 8/5 if rate control still an issue; holding off not because giving lasix and just came off neo yesterday  RENAL  Lab 06/03/12 0545 06/02/12 0515 06/01/12 0525 05/31/12 0740 05/31/12 0227 05/30/12 2134 05/30/12 1124 05/30/12 0133  NA 127* 133* 127* -- 133* -- 132* --  K 5.0 5.0 -- -- -- -- -- --  CL 99 104 97 -- 99 -- 91* --  CO2 17* 23 22 -- 23 -- 32 --  BUN 11 11 16  -- 20 -- 14 --  CREATININE 0.79 0.77 0.86 -- 1.16* -- 0.97 --  CALCIUM 7.8* 7.9* 7.4* -- 7.0* -- 7.4* --  MG -- 1.8 1.9 1.3* -- 1.5 -- 1.4*  PHOS -- 2.1* 2.6 -- -- -- -- --  Intake/Output      08/04 0701 - 08/05 0700 08/05 0701 - 08/06 0700   P.O. 720    I.V. (mL/kg) 698.9 (7.8) 53.4 (0.6)   IV Piggyback 600    Total Intake(mL/kg) 2018.9 (22.6) 53.4 (0.6)   Urine (mL/kg/hr) 1500 (0.7)    Stool 0    Total Output 1500    Net +518.9 +53.4        Stool Occurrence 3 x     Foley:  NA  Intake/Output Summary (Last 24 hours) at 06/03/12 0940 Last data filed at 06/03/12 1610  Gross per 24 hour  Intake 1988.88 ml  Output   1300 ml  Net 688.88 ml   A:  Acute renal failure / AKI vs prerenal improved.  P:   Trend BMP. Replace Mg and  potassium. Goal K >4, Mg > 2. KVO IVF  GASTROINTESTINAL  Lab 05/31/12 0227 05/29/12 1340  AST 21 36  ALT 15 17  ALKPHOS 75 86  BILITOT 0.3 0.5  PROT 3.8* 5.4*  ALBUMIN 1.5* 2.2*   A:  Diarrhea and abdominal pain Likely ischaemic colitis 8/2 CT ab (also noted in distal small bowel mesentery 7/23 CT); No abdominal pain 8/5; c.diff neg P:   Consider GI consult if diarrhea continues (could migratory inflammatory changes on CT represent IBD?) Florastor 8/5 Heart healthy diet. Amylase / lipase negative.  HEMATOLOGIC  Lab 06/03/12 0545 06/02/12 0515 06/01/12 0525 05/31/12 0227 05/30/12 2101 05/29/12 1340  HGB 11.9* 11.5* 10.5* 11.0* 13.3 --  HCT 34.6* 33.2* 31.1* 32.2* 38.4 --  PLT 181 225 219 231 265 --  INR -- -- -- -- -- 1.43  APTT -- -- -- -- -- 42*   A:  Anemia, blood loss vs dilutional.  No evidence of RP bleed on CT scan P:  Trend CBC Pradaxa held, restart this week   INFECTIOUS  Lab 06/03/12 0545 06/02/12 0515 06/01/12 0525 05/31/12 0740 05/31/12 0227 05/30/12 2101  WBC 10.9* 13.4* 23.8* -- 39.8* 30.6*  PROCALCITON -- -- -- 8.22 -- --   Cultures: 8/2    Blood >>> 7/31  Urine >>> E.coli resistant to quinilones and bactrim  Antibiotics: Cipro 7/31 >>> 8/1 Flagyl 8/1 >>>8/3 Primaxin 8/2 >>> Vancomycin 8/2 >>>8/3  A:  UTI with E.Coli.  That is resistant to quinilones and bactrim P:   Trend CBC PCT 8.22 C.dif negative. D/C flagyl and vanc.  ENDOCRINE  Lab 06/03/12 0803 06/02/12 2101 06/02/12 1626 06/02/12 1203 06/02/12 0801  GLUCAP 118* 169* 127* 146* 74   A:  DM.  Hyperglycemia.  Hypothyroidism.  Unknown adrenal function. P:   SSI/CBG. Stress dose steroids off Levothyroxine.  NEUROLOGIC  A:  No active issues. P:   No interventions required Neurontin Ambien  Transfer to SDU, TRH team 1  Max Fickle [place] [facility] Pager: [phone] Cell: [phone] If no response, call [phone]

## 2012-06-04 DIAGNOSIS — J811 Chronic pulmonary edema: Secondary | ICD-10-CM | POA: Diagnosis not present

## 2012-06-04 DIAGNOSIS — J9601 Acute respiratory failure with hypoxia: Secondary | ICD-10-CM | POA: Diagnosis not present

## 2012-06-04 DIAGNOSIS — G4733 Obstructive sleep apnea (adult) (pediatric): Secondary | ICD-10-CM | POA: Diagnosis present

## 2012-06-04 DIAGNOSIS — I959 Hypotension, unspecified: Secondary | ICD-10-CM | POA: Diagnosis not present

## 2012-06-04 DIAGNOSIS — R197 Diarrhea, unspecified: Secondary | ICD-10-CM | POA: Diagnosis not present

## 2012-06-04 DIAGNOSIS — A498 Other bacterial infections of unspecified site: Secondary | ICD-10-CM

## 2012-06-04 DIAGNOSIS — D72829 Elevated white blood cell count, unspecified: Secondary | ICD-10-CM | POA: Diagnosis present

## 2012-06-04 LAB — GLUCOSE, CAPILLARY
Glucose-Capillary: 98 mg/dL (ref 70–99)
Glucose-Capillary: 169 mg/dL — ABNORMAL HIGH (ref 70–99)
Glucose-Capillary: 97 mg/dL (ref 70–99)
Glucose-Capillary: 144 mg/dL — ABNORMAL HIGH (ref 70–99)

## 2012-06-04 LAB — CBC
HCT: 36.1 % (ref 36.0–46.0)
Hemoglobin: 12.4 g/dL (ref 12.0–15.0)
MCH: 33.2 pg (ref 26.0–34.0)
MCV: 96.8 fL (ref 78.0–100.0)
RBC: 3.73 MIL/uL — ABNORMAL LOW (ref 3.87–5.11)
WBC: 11.7 10*3/uL — ABNORMAL HIGH (ref 4.0–10.5)

## 2012-06-04 LAB — BASIC METABOLIC PANEL
CO2: 19 mEq/L (ref 19–32)
Chloride: 97 mEq/L (ref 96–112)
Glucose, Bld: 75 mg/dL (ref 70–99)
Sodium: 129 mEq/L — ABNORMAL LOW (ref 135–145)

## 2012-06-04 MED ORDER — FUROSEMIDE 10 MG/ML IJ SOLN
40.0000 mg | Freq: Once | INTRAMUSCULAR | Status: AC
  Administered 2012-06-04: 40 mg via INTRAVENOUS
  Filled 2012-06-04: qty 4

## 2012-06-04 MED ORDER — LEVALBUTEROL HCL 0.63 MG/3ML IN NEBU
0.6300 mg | INHALATION_SOLUTION | Freq: Four times a day (QID) | RESPIRATORY_TRACT | Status: DC | PRN
  Filled 2012-06-04: qty 3

## 2012-06-04 NOTE — Progress Notes (Signed)
[facility] Progress Note [place] TEAM 1 - Stepdown/ICU TEAM   [name]:[id number] DOB: [birth year] DOA: 05/29/2012 PCP: Ginette Otto, MD  Brief narrative: 78 year old female patient with multiple chronic medical problems including atrial fibrillation on Pradaxa. She presented to the hospital on 05/29/2012 with epigastric abdominal pain and was subsequently admitted for treatment of a urinary tract infection. Prior to admission she was taking antibiotics at home for persistent UTI. At time of admission she was started on ciprofloxacin. Unfortunately her white blood cell count kept increasing and she subsequently developed bloody diarrhea, hypotension and atrial fibrillation with RVR. She had limited response to fluids and was transferred to the ICU to initiate Neo-Synephrine drip. Heart rate improved after she was transitioned to an amiodarone infusion. Patient subsequently stabilized. Her bloody diarrhea stopped with restoration of perfusion and normalization of blood pressure. It was felt she had ischemic colitis related to her hypotensive episode. CT of the abdomen and pelvis demonstrated bowel wall thickening of the right colon rectum and distal ileum as well as mesenteric edema. Her urine culture subsequently grew out Escherichia coli that was resistant to Cipro. Her antibiotics were adjusted to Primaxin and she was stable enough to transfer to the step down unit. [facility] team 1 assumed care of the patient on 06/04/2012.  Events Since Admission:  7/31 Admitted with epigastric pain, treated for UTI  8/2 Transferred to ICU for hypotension and afib with RVR  8/5 Off phenylephrine, feels well, some diarrhea   Assessment/Plan: Principal Problem:  *E. coli UTI *Initially started on Cipro later found to have culture with resistance pattern and subsequently has significantly improved with transition to IV Primaxin *Apparently has history of recurrent UTI so  consider renal ultrasound versus outpatient neurology evaluation  Active Problems:  Atrial fibrillation with RVR *Remains in atrial fibrillation with controlled ventricular response *Has transition from amiodarone infusion to oral amiodarone *Appreciate cardiology recommendations *Was on Pradaxa prior to admission and this medication has been held due to lower GI bleeding secondary to ischemic colitis. Consider trial of Pradaxa to see if bleeding recurs. *If recurrent bleeding would consult gastroenterology   Sepsis/Hypotension/profound Leukocytosis *Presented with UTI and before cultures were finalized develop sepsis with shock and was later found to have a Cipro resistant organism *Did require Neo-Synephrine drip but with adequate treatment of UTI and restoration of fluid and blood volume hypotension has resolved and leukocytosis has plateaued after a peak of greater than 39,000 *Did not have any thrombocytopenia   Acute respiratory failure with hypoxia secondary to Diastolic heart failure, NYHA class 1/ Pulmonary edema *Developed initially after aggressive fluid challenges while hypotensive and current chest x-ray shows persistent edematous changes greater in the right lung.  *BNP at presentation 1621 and after hydration increased slightly to 2404 *Appreciate cardiology assistance *2-D echocardiogram reveals changes consistent with diastolic dysfunction and maybe some early right heart failure *Because blood pressure is soft we'll cautiously give IV Lasix and evaluate each day as to whether her dose as indicated *Cardiology cautions aggressive diuresis that may lead to hypotension and possibly precipitate another episode of ischemic colitis *Please note the cardiologist has reviewed the echocardiogram and notes that parameters listed for aortic stenosis are consistent with moderate stenosis and not severe.   Bloody diarrhea secondary to Ischemic colitis  *So far no further diarrhea with  blood *If bleeding recurs as noted above we'll consult GI   Chest pain *Resolved *EKG negative for ischemic changes and cardiac panel negative troponins x4 collections  Hypothyroidism *Continue Synthroid   Hypokalemia/Hyponatremia *Related to gastrointestinal losses and dehydration *Hyponatremia has slightly improved with attempts at diuresis therefore hyponatremic at this point may be dilutional   OSA (obstructive sleep apnea) *Continue nocturnal CPAP   DVT prophylaxis: No pharmacological anticoagulation secondary to bloody instead will utilize SCD's  Code Status: DO NOT RESUSCITATE Family Communication: Directly with patient today Disposition Plan: Remain in step down  Consultants: Dr. Skains/cardiology  Procedures: None  Cultures:  8/2 Blood >>>  7/31 Urine >>> E.coli resistant to quinilones and bactrim  Antibiotics: Cipro 7/31 >>> 8/1  Flagyl 8/1 >>>8/3  Primaxin 8/2 >>>  Vancomycin 8/2 >>>8/3   HPI/Subjective: Patient alert but somewhat distracted secondary to pain of attempts to insert IV. States she is short of breath and very tired. Denies awareness of any tachycardia palpitations.   Objective: Blood pressure 103/72, pulse 119, temperature 98.2 F (36.8 C), temperature source Oral, resp. rate 22, height 5\' 7"  (1.702 m), weight 87.6 kg (193 lb 2 oz), SpO2 98.00%.  Intake/Output Summary (Last 24 hours) at 06/04/12 1425 Last data filed at 06/04/12 0500  Gross per 24 hour  Intake    660 ml  Output   1750 ml  Net  -1090 ml     Exam: General: Mild respiratory distress his evidence by tachypnea, appears stated Lungs: Most clear to auscultation bilaterally but does have crackles in left mid fields and bibasilar lung sounds are very diminished, 2 L nasal cannula Cardiovascular: Irregular rate and treat rhythm without murmur gallop or rub normal S1 and S2, no peripheral edema Abdomen: Nontender, nondistended, soft, bowel sounds positive, no rebound, no  ascites, no appreciable mass Musculoskeletal: No significant cyanosis, clubbing of extremities Neurological: Alert and oriented x3, moves all extremities x4, exam non-focal Skin: Patient has 2 distinct areas on each forearm that are reddened in color with a slight purplish tinge, these areas are not warm to the touch nor do they have any areas of induration. They appear to correlate with prior venipunctures.  Data Reviewed: Basic Metabolic Panel:  Lab 06/04/12 1610 06/03/12 9604 06/02/12 5409 06/01/12 8119 05/31/12 0740 05/31/12 0227 05/30/12 2134 05/30/12 0133  NA 129* 127* 133* 127* -- 133* -- --  K 4.3 5.0 5.0 3.3* -- 3.9 -- --  CL 97 99 104 97 -- 99 -- --  CO2 19 17* 23 22 -- 23 -- --  GLUCOSE 75 114* 131* 115* -- 227* -- --  BUN 9 11 11 16  -- 20 -- --  CREATININE 0.69 0.79 0.77 0.86 -- 1.16* -- --  CALCIUM 7.9* 7.8* 7.9* 7.4* -- 7.0* -- --  MG -- -- 1.8 1.9 1.3* -- 1.5 1.4*  PHOS -- -- 2.1* 2.6 -- -- -- --   Liver Function Tests:  Lab 05/31/12 0227 05/29/12 1340  AST 21 36  ALT 15 17  ALKPHOS 75 86  BILITOT 0.3 0.5  PROT 3.8* 5.4*  ALBUMIN 1.5* 2.2*    Lab 05/31/12 0740  LIPASE 8*  AMYLASE 62   No results found for this basename: AMMONIA:5 in the last 168 hours CBC:  Lab 06/04/12 0445 06/03/12 0545 06/02/12 0515 06/01/12 0525 05/31/12 0227  WBC 11.7* 10.9* 13.4* 23.8* 39.8*  NEUTROABS -- -- -- -- --  HGB 12.4 11.9* 11.5* 10.5* 11.0*  HCT 36.1 34.6* 33.2* 31.1* 32.2*  MCV 96.8 94.8 96.2 94.8 96.4  PLT 198 181 225 219 231   Cardiac Enzymes:  Lab 05/30/12 2134 05/30/12 0853 05/30/12 0133 05/29/12 1711  CKTOTAL 62 50 47 47  CKMB 2.5 3.2 3.1 3.5  CKMBINDEX -- -- -- --  TROPONINI <0.30 <0.30 <0.30 <0.30   BNP (last 3 results)  Basename 05/31/12 0227 05/29/12 1711  PROBNP 2404.0* 1621.0*   CBG:  Lab 06/04/12 1115 06/04/12 0820 06/03/12 2324 06/03/12 1215 06/03/12 0803  GLUCAP 169* 98 149* 187* 118*    Recent Results (from the past 240 hour(s))  URINE  CULTURE     Status: Normal   Collection Time   05/29/12  2:08 PM      Component Value Range Status Comment   Specimen Description URINE, CLEAN CATCH   Final    Special Requests ADDED 05/29/12 1729   Final    Culture  Setup Time 05/29/2012 17:52   Final    Colony Count >=100,000 COLONIES/ML   Final    Culture ESCHERICHIA COLI   Final    Report Status 05/31/2012 FINAL   Final    Organism ID, Bacteria ESCHERICHIA COLI   Final   MRSA PCR SCREENING     Status: Normal   Collection Time   05/30/12 10:36 PM      Component Value Range Status Comment   MRSA by PCR NEGATIVE  NEGATIVE Final   CULTURE, BLOOD (ROUTINE X 2)     Status: Normal (Preliminary result)   Collection Time   05/31/12 12:29 AM      Component Value Range Status Comment   Specimen Description BLOOD LEFT ARM   Final    Special Requests BOTTLES DRAWN AEROBIC AND ANAEROBIC 10CC EACH   Final    Culture  Setup Time 05/31/2012 09:00   Final    Culture     Final    Value:        BLOOD CULTURE RECEIVED NO GROWTH TO DATE CULTURE WILL BE HELD FOR 5 DAYS BEFORE ISSUING A FINAL NEGATIVE REPORT   Report Status PENDING   Incomplete   CULTURE, BLOOD (ROUTINE X 2)     Status: Normal (Preliminary result)   Collection Time   05/31/12 12:29 AM      Component Value Range Status Comment   Specimen Description BLOOD LEFT HAND   Final    Special Requests BOTTLES DRAWN AEROBIC AND ANAEROBIC 5CC EACH   Final    Culture  Setup Time 05/31/2012 09:00   Final    Culture     Final    Value:        BLOOD CULTURE RECEIVED NO GROWTH TO DATE CULTURE WILL BE HELD FOR 5 DAYS BEFORE ISSUING A FINAL NEGATIVE REPORT   Report Status PENDING   Incomplete   CLOSTRIDIUM DIFFICILE BY PCR     Status: Normal   Collection Time   05/31/12  8:36 AM      Component Value Range Status Comment   C difficile by pcr NEGATIVE  NEGATIVE Final      Studies:  Recent x-ray studies have been reviewed in detail by the Attending Physician  Scheduled Meds:  Reviewed in detail by the  Attending Physician   Junious Silk, ANP [facility] Office  [phone] Pager [phone]  On-Call/Text Page:      [email or web address]      password [email or web address]  If 7PM-7AM, please contact night-coverage [email or web address] Password [email or web address] 06/04/2012, 2:25 PM   LOS: 6 days   I have examined the patient and reviewed the chart. I have modified the above note and agree with it.   Calvert Cantor, MD [phone]

## 2012-06-04 NOTE — Consult Note (Signed)
Admit date: 05/29/2012 Referring Physician  Dr. Sharon Seller Primary Physician Ginette Otto, MD Primary Cardiologist  Dr. Mayford Knife Reason for Consultation  Diastolic HF  HPI: 78 year old female admitted with urinary tract infection, ischemic colitis, atrial fibrillation with rapid ventricular response, diastolic heart failure. Her hospital admit date was 05/29/12. She originally presented with epigastric abdominal pain. On 05/31/12 she was transferred to the ICU for A. fib RVR and hypotension. On 8/6 she was once again transferred back to the hospitalist service. She was taking antibiotics at home for persistent urinary tract infection. Leukocytosis occurred and she developed bloody diarrhea, hypotension. Cardizem was stopped and amiodarone infusion was begun which helped with atrial fibrillation rate. She is a history of peripheral arterial disease as well as stroke.  Dr. Mayford Knife has been seeing her in the clinic for the management of her atrial fibrillation as well as diastolic heart failure. She has required by mouth furosemide as an outpatient.  After aggressive fluid resuscitation her BNP increased to 2400 up from 1620. She was placed on mild amounts of Neo-Synephrine.  She has a prior history of diastolic heart failure, Moderate aortic stenosis.    PMH:   Past Medical History  Diagnosis Date  . CHF (congestive heart failure)   . Hypertension   . Atrial fibrillation   . Peripheral neuropathy   . Asthma   . High cholesterol   . Type II diabetes mellitus   . PAD (peripheral artery disease)   . Enlarged heart   . Anginal pain 05/28/12  . Pneumonia 11/2010  . Sleep apnea   . Hypothyroidism   . Headache 05/29/12    "recently"  . Stroke 05/2005; 08/2010    !mini"  . Chronic kidney disease     "something on labs always say she has some problems"  . Personal history of gout     PSH:   Past Surgical History  Procedure Date  . Tonsillectomy and adenoidectomy 1960's  . Dilation and  curettage of uterus   . Vaginal hysterectomy 1970's  . Total knee arthroplasty 1970-~2002    left; right  . Cataract extraction w/ intraocular lens  implant, bilateral ?1970's   Allergies:  Aspirin; Nexium; and Penicillins Prior to Admit Meds:   Prescriptions prior to admission  Medication Sig Dispense Refill  . allopurinol (ZYLOPRIM) 300 MG tablet Take 300 mg by mouth daily.      Marland Kitchen aspirin EC 81 MG tablet Take 81 mg by mouth daily.      Marland Kitchen atorvastatin (LIPITOR) 10 MG tablet Take 10 mg by mouth daily.      . Calcium Carb-Cholecalciferol (CALCIUM + D3) 600-200 MG-UNIT TABS Take 1 tablet by mouth daily.      . dabigatran (PRADAXA) 75 MG CAPS Take 75 mg by mouth every 12 (twelve) hours.      Marland Kitchen diltiazem (TIAZAC) 240 MG 24 hr capsule Take 240 mg by mouth daily.      . furosemide (LASIX) 80 MG tablet Take 80 mg by mouth daily.      Marland Kitchen gabapentin (NEURONTIN) 300 MG capsule Take 300 mg by mouth at bedtime.      . Garlic (GARLIQUE) 400 MG TBEC Take 1 tablet by mouth daily.      Marland Kitchen glipiZIDE (GLUCOTROL XL) 5 MG 24 hr tablet Take 5 mg by mouth daily.      Marland Kitchen levofloxacin (LEVAQUIN) 500 MG tablet Take 500 mg by mouth daily. Started 05/17/12      . levothyroxine (SYNTHROID, LEVOTHROID) 88 MCG  tablet Take 88 mcg by mouth daily.      . Multiple Vitamins-Minerals (MULTIVITAMIN WITH MINERALS) tablet Take 1 tablet by mouth daily.      . Omega-3 Fatty Acids (FISH OIL) 1000 MG CAPS Take 1 capsule by mouth daily.      Marland Kitchen omeprazole (PRILOSEC) 20 MG capsule Take 20 mg by mouth daily.      . potassium chloride (KLOR-CON) 8 MEQ tablet Take 8 mEq by mouth 2 (two) times daily. For 7 days. Started 05/28/12      . PRESCRIPTION MEDICATION Take 1 tablet by mouth 2 (two) times daily. Methylfolate/Calcium      . Probiotic Product (PROBIOTIC DAILY PO) Take 1 tablet by mouth daily.      . vitamin C (ASCORBIC ACID) 500 MG tablet Take 500 mg by mouth daily.      . zaleplon (SONATA) 5 MG capsule Take 5 mg by mouth at bedtime.        Fam HX:   History reviewed. No pertinent family history. Social HX:    History   Social History  . Marital Status: Widowed    Spouse Name: N/A    Number of Children: N/A  . Years of Education: N/A   Occupational History  . Not on file.   Social History Main Topics  . Smoking status: Never Smoker   . Smokeless tobacco: Never Used  . Alcohol Use: No  . Drug Use: No  . Sexually Active: No   Other Topics Concern  . Not on file   Social History Narrative  . No narrative on file     ROS:  All 11 ROS were addressed and are negative except what is stated in the HPI  Physical Exam: Blood pressure 103/72, pulse 119, temperature 98.2 F (36.8 C), temperature source Oral, resp. rate 22, height 5\' 7"  (1.702 m), weight 87.6 kg (193 lb 2 oz), SpO2 98.00%.    General: Well developed, well nourished, in no acute distress, overweight Head: Eyes PERRLA, No xanthomas.   Normal cephalic and atramatic  Lungs:   Clear bilaterally to auscultation (prior wheeze). Normal respiratory effort. No wheezes, no rales. Heart:  Irregularly irregular, normal rate, 2/6 systolic murmur right upper sternal border, also heard at apex     No carotid bruit. No JVD.  No abdominal bruits.  Abdomen: Bowel sounds are positive, abdomen soft and non-tender without masses. No hepatosplenomegaly. Msk:  Back normal. Normal strength and tone for age. Extremities:   No clubbing, cyanosis or edema.  Palpable distal pulses Neuro: Alert and oriented X 3, non-focal, MAE x 4 GU: Deferred Rectal: Deferred Psych:  Good affect, responds appropriately    Labs:   Lab Results  Component Value Date   WBC 11.7* 06/04/2012   HGB 12.4 06/04/2012   HCT 36.1 06/04/2012   MCV 96.8 06/04/2012   PLT 198 06/04/2012    Lab 06/04/12 0445 05/31/12 0227  NA 129* --  K 4.3 --  CL 97 --  CO2 19 --  BUN 9 --  CREATININE 0.69 --  CALCIUM 7.9* --  PROT -- 3.8*  BILITOT -- 0.3  ALKPHOS -- 75  ALT -- 15  AST -- 21  GLUCOSE 75 --    No results found for this basename: PTT   Lab Results  Component Value Date   INR 1.43 05/29/2012   INR 1.23 10/27/2010   INR 1.79* 09/07/2010   Lab Results  Component Value Date   CKTOTAL 62 05/30/2012  CKMB 2.5 05/30/2012   TROPONINI <0.30 05/30/2012     Lab Results  Component Value Date   CHOL  Value: 94        ATP III CLASSIFICATION:  <200     mg/dL   Desirable  086-578  mg/dL   Borderline High  >=469    mg/dL   High        04/28/5283   CHOL  Value: 126        ATP III CLASSIFICATION:  <200     mg/dL   Desirable  132-440  mg/dL   Borderline High  >=102    mg/dL   High        72/02/3663   Lab Results  Component Value Date   HDL 41 12/11/2010   HDL 55 40/12/4740   Lab Results  Component Value Date   LDLCALC  Value: 44        Total Cholesterol/HDL:CHD Risk Coronary Heart Disease Risk Table                     Men   Women  1/2 Average Risk   3.4   3.3  Average Risk       5.0   4.4  2 X Average Risk   9.6   7.1  3 X Average Risk  23.4   11.0        Use the calculated Patient Ratio above and the CHD Risk Table to determine the patient's CHD Risk.        ATP III CLASSIFICATION (LDL):  <100     mg/dL   Optimal  595-638  mg/dL   Near or Above                    Optimal  130-159  mg/dL   Borderline  756-433  mg/dL   High  >295     mg/dL   Very High 1/88/4166   LDLCALC  Value: 55        Total Cholesterol/HDL:CHD Risk Coronary Heart Disease Risk Table                     Men   Women  1/2 Average Risk   3.4   3.3  Average Risk       5.0   4.4  2 X Average Risk   9.6   7.1  3 X Average Risk  23.4   11.0        Use the calculated Patient Ratio above and the CHD Risk Table to determine the patient's CHD Risk.        ATP III CLASSIFICATION (LDL):  <100     mg/dL   Optimal  063-016  mg/dL   Near or Above                    Optimal  130-159  mg/dL   Borderline  010-932  mg/dL   High  >355     mg/dL   Very High 73/12/2023   Lab Results  Component Value Date   TRIG 47 12/11/2010   TRIG 81 09/05/2010   Lab  Results  Component Value Date   CHOLHDL 2.3 12/11/2010   CHOLHDL 2.3 09/05/2010   No results found for this basename: LDLDIRECT      Radiology:  Dg Chest Port 1 View  06/03/2012  *RADIOLOGY REPORT*  Clinical Data: Atelectasis.  Short of breath.  PORTABLE CHEST - 1  VIEW  Comparison: 06/02/2012.  Findings: Cardiopericardial silhouette partially obscured.  Mild enlargement of the cardiopericardial silhouette.  Dense mitral annular calcification.  Aortic arch calcification.  Aeration appears worse compared to prior exam compatible with worsening pulmonary edema.  Bilateral effusions and atelectasis are also present.  IMPRESSION: Worsening pulmonary aeration compatible with increasing CHF. Basilar pulmonary edema has developed.  Original Report Authenticated By: Andreas Newport, M.D.   Personally viewed.  EKG:  Right bundle branch block, inferior infarct pattern, atrial fibrillation rate controlled. Prior EKG no change.  Echocardiogram demonstrates normal ejection fraction, diastolic dysfunction, elevated right-sided pressures with D-shaped septum, moderate aortic stenosis.  Personally viewed.   ASSESSMENT/PLAN:   78 year old female with recent bout of hypotension, rapid atrial fibrillation, ischemic colitis, pulmonary edema, dyspnea, diastolic heart failure, moderate aortic stenosis, urinary tract infection.  1. Diastolic heart failure/acute on chronic-she received Lasix yesterday with good overall diuresis. Today she feels better. Her wheezing has improved with both diuresis as well as beta agonist. I discussed her case with Jill Side, NP, and we will give her one more dose of 40 mg IV furosemide today. Be very careful to watch for worsening hypotension as well as worsening symptoms of ischemic colitis as dehydration and hypotension can lead to worsening of this condition. Challenging situation given her pulmonary edema findings on chest x-ray as well as hypotension/ischemic colitis. Avoid antihypertensives  at this time. She is no longer on Cardizem. Continue with amiodarone. Hopefully this will be temporary. Her creatinine and BUN are normal. It is likely that she will not require further Lasix tomorrow. We may wish to watch her. As an outpatient, she did require by mouth Lasix.  2. Atrial fibrillation-currently well rate controlled with amiodarone. No Cardizem due to hypotension. Her blood pressures are currently in the 120s.  3. Moderate aortic stenosis-prior echocardiogram as well as this echocardiogram personally reviewed. Continue to monitor.  We will follow along with you.  Donato Schultz, MD  06/04/2012  12:53 PM

## 2012-06-04 NOTE — Progress Notes (Signed)
Physical Therapy Treatment Patient Details Name: [name] MRN: [id number] DOB: [birth year] Today's Date: 06/04/2012 Time: 7829-5621 PT Time Calculation (min): 39 min  PT Assessment / Plan / Recommendation Comments on Treatment Session  Patient s/p UTI, SOB with decr mobility secondary to decr endurance and poor balance.  Limited treatment due to loose stools.  Will use Depends for mobility at next treatment as family has some in closet.  Will benefit from continued PT.      Follow Up Recommendations  Inpatient Rehab;Supervision/Assistance - 24 hour    Barriers to Discharge        Equipment Recommendations  Defer to next venue    Recommendations for Other Services Rehab consult  Frequency Min 3X/week   Plan Discharge plan remains appropriate;Frequency remains appropriate    Precautions / Restrictions Precautions Precautions: Fall Precaution Comments: Watch HR Restrictions Weight Bearing Restrictions: No   Pertinent Vitals/Pain HR to 131 bpm, No pain    Mobility  Bed Mobility Bed Mobility: Rolling Left;Left Sidelying to Sit;Sitting - Scoot to [place] of Bed Rolling Right: Not tested (comment) Rolling Left: 3: Mod assist;With rail Left Sidelying to Sit: 1: +2 Total assist;With rails;HOB elevated Left Sidelying to Sit: Patient Percentage: 60% Sitting - Scoot to Edge of Bed: 1: +2 Total assist Sitting - Scoot to Edge of Bed: Patient Percentage: 50% Sit to Supine: Not Tested (comment) Details for Bed Mobility Assistance: cues needed for technique.  Patient had difficulty problem solving sequence to get to EOB.   Transfers Transfers: Sit to Stand;Stand to Sit Sit to Stand: 1: +2 Total assist;With upper extremity assist;With armrests;From bed;From chair/3-in-1 Sit to Stand: Patient Percentage: 50% Stand to Sit: 1: +2 Total assist;With upper extremity assist;With armrests;To chair/3-in-1 Stand to Sit: Patient Percentage: 50% Stand Pivot Transfers: 1: +2 Total assist;With  armrests Stand Pivot Transfers: Patient Percentage: 50% Details for Transfer Assistance: PAtient continues to struggle with sit to stand and is shaky.  Takes time to achieve full upright stance.  Patient continues to need a lot of cuing to step around for stand pivot transfer with RW.  Patient has delayed response to cuing.  Patient ambulated a few steps and began to have BM.  Retrieved 3N1 and  patient sat down and had BM.  Once she sat and had BM and stood to be cleaned  2 x, pt. was too fatigued to ambulate therefore brought recliner behind her.  HR 131 today therefore is getting under better control.   Ambulation/Gait Ambulation/Gait Assistance: 1: +2 Total assist Ambulation/Gait: Patient Percentage: 70% Ambulation Distance (Feet): 2 Feet Assistive device: Rolling walker Ambulation/Gait Assistance Details: Leans posteriorly needing constant support and manual facilitation to lean anteriorly.  Patient with poor postural stability. Gait Pattern: Step-to pattern;Decreased stride length;Wide base of support;Trunk flexed;Decreased step length - left;Decreased step length - right General Gait Details: poor processing of cues to sequence steps Stairs: No Wheelchair Mobility Wheelchair Mobility: No     PT Goals Acute Rehab PT Goals PT Goal: Rolling Supine to Left Side - Progress: Progressing toward goal PT Goal: Supine/Side to Sit - Progress: Progressing toward goal PT Goal: Sit at Edge Of Bed - Progress: Progressing toward goal PT Goal: Sit to Stand - Progress: Progressing toward goal PT Goal: Stand to Sit - Progress: Progressing toward goal PT Transfer Goal: Bed to Chair/Chair to Bed - Progress: Progressing toward goal  Visit Information  Last PT Received On: 06/04/12 Assistance Needed: +2    Subjective Data  Subjective: "I need to  have a BM."   Cognition  Overall Cognitive Status: Appears within functional limits for tasks assessed/performed Arousal/Alertness:  Awake/alert Orientation Level: Appears intact for tasks assessed Behavior During Session: [facility] for tasks performed    Balance  Static Sitting Balance Static Sitting - Balance Support: Feet supported;Bilateral upper extremity supported Static Sitting - Level of Assistance: 3: Mod assist Static Sitting - Comment/# of Minutes: 2 min, leaning posteriorly today Static Standing Balance Static Standing - Balance Support: Bilateral upper extremity supported;During functional activity Static Standing - Level of Assistance: 1: +2 Total assist (pt = 50-65%) Static Standing - Comment/# of Minutes: 2 minutes x2 to be cleaned.  Leans posteriorly needing incr assist to maintain balance.  End of Session PT - End of Session Equipment Utilized During Treatment: Gait belt;Oxygen Activity Tolerance: Patient limited by fatigue;Treatment limited secondary to medical complications (Comment) (HR to 131) Patient left: in chair;with call bell/phone within reach;with family/visitor present Nurse Communication: Mobility status       INGOLD,Kiwanna Spraker 06/04/2012, 3:37 PM  [facility] Acute Rehabilitation [phone] (pager)

## 2012-06-05 DIAGNOSIS — I4891 Unspecified atrial fibrillation: Secondary | ICD-10-CM

## 2012-06-05 DIAGNOSIS — A4189 Other specified sepsis: Secondary | ICD-10-CM

## 2012-06-05 DIAGNOSIS — R5381 Other malaise: Secondary | ICD-10-CM

## 2012-06-05 DIAGNOSIS — J9600 Acute respiratory failure, unspecified whether with hypoxia or hypercapnia: Secondary | ICD-10-CM

## 2012-06-05 LAB — GLUCOSE, CAPILLARY
Glucose-Capillary: 93 mg/dL (ref 70–99)
Glucose-Capillary: 94 mg/dL (ref 70–99)
Glucose-Capillary: 132 mg/dL — ABNORMAL HIGH (ref 70–99)

## 2012-06-05 MED ORDER — CEFTRIAXONE SODIUM 1 G IJ SOLR
1.0000 g | INTRAMUSCULAR | Status: DC
  Administered 2012-06-05 – 2012-06-06 (×2): 1 g via INTRAVENOUS
  Filled 2012-06-05 (×2): qty 10

## 2012-06-05 MED ORDER — LEVALBUTEROL HCL 0.63 MG/3ML IN NEBU
0.6300 mg | INHALATION_SOLUTION | RESPIRATORY_TRACT | Status: DC | PRN
  Administered 2012-06-06: 0.63 mg via RESPIRATORY_TRACT
  Filled 2012-06-05: qty 3

## 2012-06-05 MED ORDER — DABIGATRAN ETEXILATE MESYLATE 75 MG PO CAPS
75.0000 mg | ORAL_CAPSULE | Freq: Two times a day (BID) | ORAL | Status: DC
  Administered 2012-06-05 – 2012-06-06 (×3): 75 mg via ORAL
  Filled 2012-06-05 (×5): qty 1

## 2012-06-05 MED ORDER — DILTIAZEM HCL 30 MG PO TABS
30.0000 mg | ORAL_TABLET | Freq: Four times a day (QID) | ORAL | Status: DC
  Administered 2012-06-05 – 2012-06-06 (×5): 30 mg via ORAL
  Filled 2012-06-05 (×8): qty 1

## 2012-06-05 NOTE — Progress Notes (Signed)
Nutrition Follow-up  Intervention:   No new nutrition interventions   Assessment:   Transferred to step down. Eating well  Diet Order:  [name] PO intake: 100% Supplements: Ensure pudding BID  Meds: Scheduled Meds:   . acidophilus  1 capsule Oral BID  . allopurinol  300 mg Oral Daily  . amiodarone  200 mg Oral Daily  . antiseptic oral rinse  15 mL Mouth Rinse QID  . atorvastatin  10 mg Oral Daily  . cefTRIAXone (ROCEPHIN)  IV  1 g Intravenous Q24H  . diltiazem  30 mg Oral Q6H  . famotidine  20 mg Oral QHS  . feeding supplement  1 Container Oral BID BM  . furosemide  40 mg Intravenous Once  . gabapentin  300 mg Oral QHS  . insulin aspart  0-9 Units Subcutaneous TID AC  . levothyroxine  88 mcg Oral QAC breakfast  . sodium chloride  3 mL Intravenous Q12H  . zolpidem  5 mg Oral QHS  . DISCONTD: imipenem-cilastatin  500 mg Intravenous Q8H  . DISCONTD: magnesium  2 g Intravenous Once   Continuous Infusions:  PRN Meds:.sodium chloride, acetaminophen, levalbuterol, ondansetron (ZOFRAN) IV, pramoxine-mineral oil-zinc, sodium chloride, witch hazel-glycerin, DISCONTD: levalbuterol  Labs:  CMP     Component Value Date/Time   NA 129* 06/04/2012 0445   K 4.3 06/04/2012 0445   CL 97 06/04/2012 0445   CO2 19 06/04/2012 0445   GLUCOSE 75 06/04/2012 0445   BUN 9 06/04/2012 0445   CREATININE 0.69 06/04/2012 0445   CALCIUM 7.9* 06/04/2012 0445   PROT 3.8* 05/31/2012 0227   ALBUMIN 1.5* 05/31/2012 0227   AST 21 05/31/2012 0227   ALT 15 05/31/2012 0227   ALKPHOS 75 05/31/2012 0227   BILITOT 0.3 05/31/2012 0227   GFRNONAA 77* 06/04/2012 0445   GFRAA 89* 06/04/2012 0445     Intake/Output Summary (Last 24 hours) at 06/05/12 0952 Last data filed at 06/05/12 1610  Gross per 24 hour  Intake   1008 ml  Output   1275 ml  Net   -267 ml    Weight Status:  192 lbs, up from admission weight by 25 lbs  Re-estimated needs:  1600-1800 kcal, 80-90 gm protein   Nutrition Dx:  Unintentional weight loss r/t sub optimal  oral intake AEB 7% weight loss x 1 month   Goal:  Oral intake with meals & supplements to meet >/= 90% of estimated nutrition needs   Monitor:  PO intake, weight    Clarene Duke RD, LDN Pager [phone] After Hours pager [phone]

## 2012-06-05 NOTE — Progress Notes (Signed)
Attempted to call report to RN on 3W, will have to return call, RN not available, Berle Mull RN

## 2012-06-05 NOTE — Progress Notes (Signed)
[facility] Progress Note [place] TEAM 1 - Stepdown/ICU TEAM   [name] UVO:[id number] DOB: July 15, 1924 DOA: 05/29/2012 PCP: Ginette Otto, MD  Brief narrative: 78 year old female patient with multiple chronic medical problems including atrial fibrillation on Pradaxa. She presented to the hospital on 05/29/2012 with epigastric abdominal pain and was subsequently admitted for treatment of a urinary tract infection. Prior to admission she was taking antibiotics at home for persistent UTI. At time of admission she was started on ciprofloxacin. Unfortunately her white blood cell count kept increasing and she subsequently developed bloody diarrhea, hypotension and atrial fibrillation with RVR. She had limited response to fluids and was transferred to the ICU to initiate Neo-Synephrine drip. Heart rate improved after she was transitioned to an amiodarone infusion. Patient subsequently stabilized. Her bloody diarrhea stopped with restoration of perfusion and normalization of blood pressure. It was felt she had ischemic colitis related to her hypotensive episode. CT of the abdomen and pelvis demonstrated bowel wall thickening of the right colon rectum and distal ileum as well as mesenteric edema. Her urine culture subsequently grew out Escherichia coli that was resistant to Cipro. Her antibiotics were adjusted to Primaxin and she was stable enough to transfer to the step down unit. [facility] team 1 assumed care of the patient on 06/04/2012.  Events Since Admission:  7/31 Admitted with epigastric pain, treated for UTI  8/2 Transferred to ICU for hypotension and afib with RVR  8/5 Off phenylephrine, feels well, some diarrhea  Assessment/Plan:  Quinolone resistant E. coli UTI *Initially started on Cipro later found to have culture with resistance pattern and subsequently has significantly improved with transition to IV Primaxin *Will narrow to Rocephin-has history of rash  with penicillin and discussed with Dr. Sharon Seller and agrees appropriate to change to Rocephin *Apparently has history of recurrent UTI so consider renal ultrasound versus outpatient urology evaluation  Atrial fibrillation with RVR *Remains in atrial fibrillation with mostly controlled ventricular response *Has transition from amiodarone infusion to oral amiodarone *Appreciate cardiology recommendations *Was on Pradaxa prior to admission and this medication has been held due to lower GI bleeding secondary to ischemic colitis.  *Began trial of Pradaxa 06/05/2012 to see if bleeding recurs *If recurrent bleeding would consult gastroenterology  Sepsis/Hypotension/profound Leukocytosis *Presented with UTI and before cultures were finalized developed sepsis with shock and was later found to have a Cipro resistant organism *Did require Neo-Synephrine drip but with adequate treatment of UTI and restoration of fluid and blood volume hypotension has resolved and leukocytosis has plateaued after a peak of greater than 39,000 *Did not have any thrombocytopenia  Acute respiratory failure with hypoxia secondary to Diastolic heart failure, NYHA class 1/ Pulmonary edema *Developed initially after aggressive fluid challenges while hypotensive and current chest x-ray shows persistent edematous changes greater in the right lung.  *BNP at presentation 1621 and after hydration increased slightly to 2404 *Appreciate cardiology assistance *2-D echocardiogram reveals changes consistent with diastolic dysfunction and maybe some early right heart failure *Please note the cardiologist has reviewed the echocardiogram and notes that parameters listed for aortic stenosis are consistent with moderate stenosis and not severe.   Bloody diarrhea secondary to Ischemic colitis *So far no further diarrhea with blood *If bleeding recurs as noted above we'll consult GI  Chest pain *Resolved *EKG negative for ischemic changes and  cardiac panel negative troponins x4 collections  Hypothyroidism *Continue Synthroid  Hypokalemia/Hyponatremia *Related to gastrointestinal losses and dehydration *Hyponatremia has slightly improved with attempts at diuresis therefore hyponatremic  at this point may be dilutional  OSA (obstructive sleep apnea) *Continue nocturnal CPAP  DVT prophylaxis:  SCD's  Code Status: DO NOT RESUSCITATE Disposition Plan: Transfer to telemetry  Consultants: Dr. Skains/cardiology  Procedures: None  Cultures:  8/2 Blood >>>  7/31 Urine >>> E.coli resistant to quinilones and bactrim  Antibiotics: Cipro 7/31 >>> 8/1  Flagyl 8/1 >>>8/3  Primaxin 8/2 >>> 8/7 Vancomycin 8/2 >>>8/3 Rocephin 8/7 >>>  HPI/Subjective: Patient alert.  States she is short of breath and very tired. Denies awareness of any tachycardia palpitations.   Objective: Blood pressure 102/59, pulse 98, temperature 98.4 F (36.9 C), temperature source Oral, resp. rate 17, height 5\' 7"  (1.702 m), weight 87.4 kg (192 lb 10.9 oz), SpO2 94.00%.  Intake/Output Summary (Last 24 hours) at 06/05/12 1232 Last data filed at 06/05/12 1212  Gross per 24 hour  Intake    908 ml  Output   1476 ml  Net   -568 ml     Exam: General: No acute respiratory distress Lungs: Clear to auscultation bilaterally but very diminished in both bases Cardiovascular: Irregular rate and rhythm without murmur gallop or rub normal S1 and S2, no peripheral edema Abdomen: Nontender, nondistended, soft, bowel sounds positive, no rebound, no ascites, no appreciable mass Musculoskeletal: No significant cyanosis, clubbing of extremities Neurological: Alert and oriented x3, moves all extremities x4, exam non-focal.  Data Reviewed: Basic Metabolic Panel:  Lab 06/04/12 1610 06/03/12 0545 06/02/12 0515 06/01/12 0525 05/31/12 0740 05/31/12 0227 05/30/12 2134 05/30/12 0133  NA 129* 127* 133* 127* -- 133* -- --  K 4.3 5.0 5.0 3.3* -- 3.9 -- --  CL 97 99 104  97 -- 99 -- --  CO2 19 17* 23 22 -- 23 -- --  GLUCOSE 75 114* 131* 115* -- 227* -- --  BUN 9 11 11 16  -- 20 -- --  CREATININE 0.69 0.79 0.77 0.86 -- 1.16* -- --  CALCIUM 7.9* 7.8* 7.9* 7.4* -- 7.0* -- --  MG -- -- 1.8 1.9 1.3* -- 1.5 1.4*  PHOS -- -- 2.1* 2.6 -- -- -- --   Liver Function Tests:  Lab 05/31/12 0227 05/29/12 1340  AST 21 36  ALT 15 17  ALKPHOS 75 86  BILITOT 0.3 0.5  PROT 3.8* 5.4*  ALBUMIN 1.5* 2.2*    Lab 05/31/12 0740  LIPASE 8*  AMYLASE 62   CBC:  Lab 06/04/12 0445 06/03/12 0545 06/02/12 0515 06/01/12 0525 05/31/12 0227  WBC 11.7* 10.9* 13.4* 23.8* 39.8*  NEUTROABS -- -- -- -- --  HGB 12.4 11.9* 11.5* 10.5* 11.0*  HCT 36.1 34.6* 33.2* 31.1* 32.2*  MCV 96.8 94.8 96.2 94.8 96.4  PLT 198 181 225 219 231   Cardiac Enzymes:  Lab 05/30/12 2134 05/30/12 0853 05/30/12 0133 05/29/12 1711  CKTOTAL 62 50 47 47  CKMB 2.5 3.2 3.1 3.5  CKMBINDEX -- -- -- --  TROPONINI <0.30 <0.30 <0.30 <0.30   BNP (last 3 results)  Basename 05/31/12 0227 05/29/12 1711  PROBNP 2404.0* 1621.0*   CBG:  Lab 06/05/12 0748 06/04/12 2144 06/04/12 1614 06/04/12 1115 06/04/12 0820  GLUCAP 93 144* 97 169* 98    Recent Results (from the past 240 hour(s))  URINE CULTURE     Status: Normal   Collection Time   05/29/12  2:08 PM      Component Value Range Status Comment   Specimen Description URINE, CLEAN CATCH   Final    Special Requests ADDED 05/29/12 1729  Final    Culture  Setup Time 05/29/2012 17:52   Final    Colony Count >=100,000 COLONIES/ML   Final    Culture ESCHERICHIA COLI   Final    Report Status 05/31/2012 FINAL   Final    Organism ID, Bacteria ESCHERICHIA COLI   Final   MRSA PCR SCREENING     Status: Normal   Collection Time   05/30/12 10:36 PM      Component Value Range Status Comment   MRSA by PCR NEGATIVE  NEGATIVE Final   CULTURE, BLOOD (ROUTINE X 2)     Status: Normal (Preliminary result)   Collection Time   05/31/12 12:29 AM      Component Value Range  Status Comment   Specimen Description BLOOD LEFT ARM   Final    Special Requests BOTTLES DRAWN AEROBIC AND ANAEROBIC 10CC EACH   Final    Culture  Setup Time 05/31/2012 09:00   Final    Culture     Final    Value:        BLOOD CULTURE RECEIVED NO GROWTH TO DATE CULTURE WILL BE HELD FOR 5 DAYS BEFORE ISSUING A FINAL NEGATIVE REPORT   Report Status PENDING   Incomplete   CULTURE, BLOOD (ROUTINE X 2)     Status: Normal (Preliminary result)   Collection Time   05/31/12 12:29 AM      Component Value Range Status Comment   Specimen Description BLOOD LEFT HAND   Final    Special Requests BOTTLES DRAWN AEROBIC AND ANAEROBIC 5CC EACH   Final    Culture  Setup Time 05/31/2012 09:00   Final    Culture     Final    Value:        BLOOD CULTURE RECEIVED NO GROWTH TO DATE CULTURE WILL BE HELD FOR 5 DAYS BEFORE ISSUING A FINAL NEGATIVE REPORT   Report Status PENDING   Incomplete   CLOSTRIDIUM DIFFICILE BY PCR     Status: Normal   Collection Time   05/31/12  8:36 AM      Component Value Range Status Comment   C difficile by pcr NEGATIVE  NEGATIVE Final      Studies:  Recent x-ray studies have been reviewed in detail by the Attending Physician  Scheduled Meds:  Reviewed in detail by the Attending Physician   Junious Silk, ANP [facility] Office  [phone] Pager [phone]  On-Call/Text Page:      [email or web address]      password [email or web address]  If 7PM-7AM, please contact night-coverage [email or web address] Password [email or web address] 06/05/2012, 12:32 PM   LOS: 7 days   I have personally examined this patient and reviewed the entire database. I have reviewed the above note, made any necessary editorial changes, and agree with its content.  Lonia Blood, MD [facility]

## 2012-06-05 NOTE — Progress Notes (Signed)
SUBJECTIVE:  Rate controlled on IV Amio  OBJECTIVE:   Vitals:   Filed Vitals:   06/05/12 0745 06/05/12 0748 06/05/12 1155 06/05/12 1200  BP: 90/50 102/59    Pulse: 101   98  Temp: 98.3 F (36.8 C)  98.4 F (36.9 C)   TempSrc: Oral  Oral   Resp: 17     Height:      Weight:      SpO2: 94%      I&O's:   Intake/Output Summary (Last 24 hours) at 06/05/12 1345 Last data filed at 06/05/12 1212  Gross per 24 hour  Intake    908 ml  Output   1476 ml  Net   -568 ml   TELEMETRY: Reviewed telemetry pt in atrial fibrillation     PHYSICAL EXAM General: Well developed, well nourished, in no acute distress Head: Eyes PERRLA, No xanthomas.   Normal cephalic and atramatic  Lungs:   Clear bilaterally to auscultation and percussion. Heart:   Irregularly irregularS1 S2 Pulses are 2+ & equal. Abdomen: Bowel sounds are positive, abdomen soft and non-tender without masses Extremities:   No clubbing, cyanosis or edema.  DP +1 Neuro: Alert and oriented X 3. Psych:  Good affect, responds appropriately   LABS: Basic Metabolic Panel:  Basename 06/04/12 0445 06/03/12 0545  NA 129* 127*  K 4.3 5.0  CL 97 99  CO2 19 17*  GLUCOSE 75 114*  BUN 9 11  CREATININE 0.69 0.79  CALCIUM 7.9* 7.8*  MG -- --  PHOS -- --   Liver Function Tests: No results found for this basename: AST:2,ALT:2,ALKPHOS:2,BILITOT:2,PROT:2,ALBUMIN:2 in the last 72 hours No results found for this basename: LIPASE:2,AMYLASE:2 in the last 72 hours CBC:  Basename 06/04/12 0445 06/03/12 0545  WBC 11.7* 10.9*  NEUTROABS -- --  HGB 12.4 11.9*  HCT 36.1 34.6*  MCV 96.8 94.8  PLT 198 181   Cardiac Enzymes: No results found for this basename: CKTOTAL:3,CKMB:3,CKMBINDEX:3,TROPONINI:3 in the last 72 hours BNP: No components found with this basename: POCBNP:3 D-Dimer: No results found for this basename: DDIMER:2 in the last 72 hours Hemoglobin A1C: No results found for this basename: HGBA1C in the last 72 hours Fasting  Lipid Panel: No results found for this basename: CHOL,HDL,LDLCALC,TRIG,CHOLHDL,LDLDIRECT in the last 72 hours Thyroid Function Tests: No results found for this basename: TSH,T4TOTAL,FREET3,T3FREE,THYROIDAB in the last 72 hours Anemia Panel: No results found for this basename: VITAMINB12,FOLATE,FERRITIN,TIBC,IRON,RETICCTPCT in the last 72 hours Coag Panel:   Lab Results  Component Value Date   INR 1.43 05/29/2012   INR 1.23 10/27/2010   INR 1.79* 09/07/2010    RADIOLOGY: Ct Abdomen Pelvis Wo Contrast  05/31/2012  *RADIOLOGY REPORT*  Clinical Data: Right lower quadrant pain and status post fall. Evaluate for hematoma.  CT ABDOMEN AND PELVIS WITHOUT CONTRAST  Technique:  Multidetector CT imaging of the abdomen and pelvis was performed following the standard protocol without intravenous contrast.  Comparison: 05/21/2012  Findings: There are small pleural effusions, right side greater than left.  There appears to be a stable 3 mm nodule in the right middle lobe on the first image.  The there are extensive mitral annular calcifications.  There is no evidence of free air.  The patient has developed a small amount of perihepatic ascites which is low density and not suggestive for hemorrhage.  Evidence for calcified gallstones.  No gross abnormality to the spleen or pancreas.  Mild fullness of the adrenal glands without gross abnormality.  There is stable fullness  of the right renal pelvis. No gross abnormality to the kidneys.  There is new wall thickening of the ascending colon and increased wall thickening of the terminal and distal ileum.  There is contrast within the right colon and no evidence for obstruction.  There is a new circumferential wall thickening involving the rectum and distal sigmoid colon.  There is new edema and fluid within the pelvis and presacral region. Calcification just posterior to the right renal pelvis appears to be outside the collecting system.  The infrarenal abdominal images of  2.8 cm with circumferential calcifications.  Again noted is prominent left ovarian tissue measuring up to 3.7 cm and grossly similar to the previous examination.  There is a complex right inguinal hernia which is unchanged.  No acute bony abnormality.  IMPRESSION:  New areas of bowel wall thickening involving the right colon, rectum and distal ileum.  Findings could represent an infectious or inflammatory process but the distribution also raises concern for ischemia.  Patient has developed new mesenteric edema and small amount of abdominal and pelvic fluid.  Small pleural effusions.  Stable 3 mm nodule in the right middle lobe which may be calcified.  No evidence for a hematoma formation or acute bleed.  Gallstones.  These results will be called to the ordering clinician or representative by the Radiologist Assistant, and communication documented in the PACS Dashboard.  Original Report Authenticated By: Richarda Overlie, M.D.   Ct Abdomen Pelvis W Contrast  05/21/2012  *RADIOLOGY REPORT*  Clinical Data: Right lower abdominal pain for 1 week with diarrhea. History of cholecystectomy and bladder tacking.  No history of malignancy.  CT ABDOMEN AND PELVIS WITH CONTRAST  Technique:  Multidetector CT imaging of the abdomen and pelvis was performed following the standard protocol during bolus administration of intravenous contrast.  Contrast: OMNIPAQUE IOHEXOL 300 MG/ML  SOLN  Comparison: Abdominal ultrasound 05/27/2005.  Findings: Images through the lung bases demonstrate mild dependent atelectasis bilaterally.  There are mitral annular calcifications as well as calcifications of the aortic valve.  There are diffuse coronary artery calcifications.  The liver, spleen and adrenal glands appear unremarkable.  The pancreas is atrophied.  The gallbladder is contracted and shows evidence of gallstones.  In addition, there may be calcification within the wall of the gallbladder (versus hyperemia).  There is no biliary  dilatation.  Mild renal cortical thinning is present on the left.  There is no hydronephrosis or focal cortical abnormality.  There is extensive atherosclerosis of the abdominal aorta and proximal visceral arteries.  No large vessel occlusion is seen. The inferior mesenteric artery appears patent.  The distal abdominal aorta is mildly dilated to 2.7 cm and demonstrates eccentric thrombus.  The stomach and proximal small bowel are normal in caliber.  There are moderately dilated loops of mid to distal small bowel.  The terminal ileum is relatively decompressed and demonstrates a "small bowel feces sign."  The distal small bowel shows some wall thickening and surrounding mesenteric soft tissue edema.  There is no focal transition point, pneumatosis or extraluminal fluid collection.  The colon is decompressed. There are mild sigmoid colon diverticular changes.  There is a right suprapubic hernia containing fat.  There is soft tissue stranding throughout the fat in this hernia.  There is no herniated small bowel.  The uterus is surgically absent.  A 3.7 x 2.3 cm left adnexal soft tissue nodule on image 71 likely represents the patient's left ovary.  The bladder appears normal. There are  mild facet degenerative changes of the lower lumbar spine.  IMPRESSION:  1.  Abnormal study demonstrates distal small bowel distension with mild wall thickening and surrounding inflammatory change.  Findings are suggestive of possible mesenteric ischemia with resulting partial small bowel obstruction.  The presence of a "small bowel feces sign" implies some chronicity to the findings. 2.  No evidence of bowel perforation or pneumatosis. 3.  Extensive atherosclerosis. 4.  Right suprapubic hernia containing mildly inflamed fat.  No herniated bowel. 5.  Cholelithiasis with possible gallbladder wall calcification.  These results were called by telephone on 05/21/2012 at 1625 hours to Dr. Pete Glatter, who verbally acknowledged these results.   Original Report Authenticated By: Gerrianne Scale, M.D.   Dg Chest Port 1 View  06/03/2012  *RADIOLOGY REPORT*  Clinical Data: Atelectasis.  Short of breath.  PORTABLE CHEST - 1 VIEW  Comparison: 06/02/2012.  Findings: Cardiopericardial silhouette partially obscured.  Mild enlargement of the cardiopericardial silhouette.  Dense mitral annular calcification.  Aortic arch calcification.  Aeration appears worse compared to prior exam compatible with worsening pulmonary edema.  Bilateral effusions and atelectasis are also present.  IMPRESSION: Worsening pulmonary aeration compatible with increasing CHF. Basilar pulmonary edema has developed.  Original Report Authenticated By: Andreas Newport, M.D.   Dg Chest Port 1 View  06/02/2012  *RADIOLOGY REPORT*  Clinical Data: Evaluate for pulmonary edema.  PORTABLE CHEST - 1 VIEW  Comparison: Chest x-ray 05/29/2012.  Findings: There are extensive bibasilar opacities (left greater than right) which may represent areas of atelectasis and/or consolidation, likely with superimposed small bilateral pleural effusions (left greater than right).  Minimal pulmonary venous engorgement without frank pulmonary edema.  Mild cardiomegaly is unchanged.  Extensive calcifications of the mitral annulus. Atherosclerosis in the thoracic aorta.  Upper mediastinal contours are within normal limits.  IMPRESSION: 1.  Mild cardiomegaly with mild pulmonary venous congestion, but no frank pulmonary edema. 2.  Extensive bibasilar opacities (left greater than right), compatible with areas of atelectasis and/or consolidation, with superimposed bilateral pleural effusions (left greater than right). 3.  Atherosclerosis.  Original Report Authenticated By: Florencia Reasons, M.D.   Dg Chest Portable 1 View  05/29/2012  *RADIOLOGY REPORT*  Clinical Data:  Chest pain  PORTABLE CHEST - 1 VIEW  Comparison: Most recent prior chest x-ray 12/16/2010, CT scan of the abdomen and pelvis including the lung bases  05/21/2012  Findings: Persistent, but improved cardiomegaly compared to the prior chest x-ray.  Atherosclerotic calcification of the transverse aorta again noted.  Diffuse calcification of the mitral valve annulus.  The lungs are negative for pulmonary edema, or focal airspace consolidation.  Streaky left retrocardiac opacities are favored to reflect atelectasis.  No pulmonary nodule.  No acute osseous finding.  IMPRESSION:  1.  No acute cardiopulmonary disease. 2.  Persistent, but improved cardiomegaly compared to 12/16/2010 3.  Prominent calcification of the mitral valve annulus 4.  Atherosclerosis  Original Report Authenticated By: Alvino Blood Abd 2 Views  05/29/2012  *RADIOLOGY REPORT*  Clinical Data: Lower abdominal pain with nausea and vomiting. Partial small bowel obstruction.  ABDOMEN - 2 VIEW  Comparison: CT 05/21/2012.  Findings: Upright and 2 supine views.  Upright view demonstrates mild S-shaped thoracolumbar spine curvature.  No significant air fluid levels or free intraperitoneal air.  The pelvis is excluded from the upright view.  The supine views demonstrate no residual small bowel dilatation. Moderate stool within the colon.  Low pelvis is also excluded from the supine views.  No  pneumatosis.  Aortic atherosclerosis.  Mitral annular calcifications incidentally noted.  IMPRESSION: Resolved small bowel obstruction.  No evidence of free intraperitoneal air, pneumatosis, or other acute complication.  Possible constipation.  Exclusion of the pelvis from both the upright and supine images.  Original Report Authenticated By: Consuello Bossier, M.D.   Dg Abd Portable 2v  05/31/2012  *RADIOLOGY REPORT*  Clinical Data: Concern is for perforation.  Evaluate for free air. The patient is hypotensive, with elevated white count.  PORTABLE ABDOMEN - 2 VIEW  Comparison: 05/29/2012  Findings: The heart is enlarged.  There is increased left lower lobe infiltrate since prior study.  Possible small left pleural  effusion.  Bowel gas pattern is nonobstructive.  Degenerative changes are seen in the spine.  No evidence for free intraperitoneal air.  IMPRESSION:  1.  Increased left lower lobe infiltrate or atelectasis and left pleural effusion. 2.  Nonobstructive bowel gas pattern. 3.  No evidence for free intraperitoneal air.  Original Report Authenticated By: Patterson Hammersmith, M.D.      ASSESSMENT:  1.  Acute on chronic diastolic CHF - appears compensated at this point.  No Lasix needed today. 2.  Chronic atrial fibrillation rate controlled.  Changed to PO amio and cardiazem for rate control 3.  Moderate AS 4.  Systemic anticoagulation with Pradaxa  PLAN:   No new recs at this time  Quintella Reichert, MD  06/05/2012  1:45 PM

## 2012-06-05 NOTE — Progress Notes (Signed)
Report given to Otelia Santee RN on [facility] to transfer via bed to [id number], will let patient eat their lunch, Berle Mull RN

## 2012-06-05 NOTE — Progress Notes (Signed)
Attempted to again call report to [facility], RN remains unavailable, Berle Mull RN

## 2012-06-05 NOTE — Consult Note (Addendum)
Physical Medicine and Rehabilitation Consult Reason for Consult:generalized weakness  Referring Physician: Sharon Seller   HPI: [name] is a 78 y.o. female  with multiple chronic medical problems including, peripheral neuropathy, atrial fibrillation, and CHF. She presented to the hospital on 05/29/2012 with epigastric abdominal pain and was subsequently admitted for treatment of a urinary tract infection. Prior to admission she was taking antibiotics at home for persistent UTI. At time of admission she was started on ciprofloxacin. Unfortunately her white blood cell count kept increasing and she subsequently developed bloody diarrhea, hypotension and atrial fibrillation with RVR. She had limited response to fluids and was transferred to the ICU to initiate Neo-Synephrine drip. Heart rate improved after she was transitioned to an amiodarone infusion. Patient subsequently stabilized. Her bloody diarrhea stopped with restoration of perfusion and normalization of blood pressure. It was felt she had ischemic colitis related to her hypotensive episode. CT of the abdomen and pelvis demonstrated bowel wall thickening of the right colon rectum and distal ileum as well as mesenteric edema. Her urine culture subsequently grew out Escherichia coli that was resistant to Cipro. Her antibiotics were adjusted to Primaxin and she was stable enough to transfer to the step down unit. Cardiology continues to manage her volume status/CHF. Rehab was consulted to assess whether she might be a candidate for the inpatient rehab program.  ROS: Pt reports fatigue, occasional insomnia, and shortness of breath, decreased cough.  A 12 point review of systems has been performed and if not noted above is otherwise negative.  Past Medical History  Diagnosis Date  . CHF (congestive heart failure)   . Hypertension   . Atrial fibrillation   . Peripheral neuropathy   . Asthma   . High cholesterol   . Type II diabetes mellitus    . PAD (peripheral artery disease)   . Enlarged heart   . Anginal pain 05/28/12  . Pneumonia 11/2010  . Sleep apnea   . Hypothyroidism   . Headache 05/29/12    "recently"  . Stroke 05/2005; 08/2010    !mini"  . Chronic kidney disease     "something on labs always say she has some problems"  . Personal history of gout    Past Surgical History  Procedure Date  . Tonsillectomy and adenoidectomy 1960's  . Dilation and curettage of uterus   . Vaginal hysterectomy 1970's  . Total knee arthroplasty 1970-~2002    left; right  . Cataract extraction w/ intraocular lens  implant, bilateral ?1970's   History reviewed. No pertinent family history. Social History:  reports that she has never smoked. She has never used smokeless tobacco. She reports that she does not drink alcohol or use illicit drugs. Allergies:  Allergies  Allergen Reactions  . Aspirin Other (See Comments)    Tears my stomach up; "can take coated Aspirin qd without problems"  . Nexium (Esomeprazole Magnesium) Rash  . Penicillins Rash   Medications Prior to Admission  Medication Sig Dispense Refill  . allopurinol (ZYLOPRIM) 300 MG tablet Take 300 mg by mouth daily.      Marland Kitchen aspirin EC 81 MG tablet Take 81 mg by mouth daily.      Marland Kitchen atorvastatin (LIPITOR) 10 MG tablet Take 10 mg by mouth daily.      . Calcium Carb-Cholecalciferol (CALCIUM + D3) 600-200 MG-UNIT TABS Take 1 tablet by mouth daily.      . dabigatran (PRADAXA) 75 MG CAPS Take 75 mg by mouth every 12 (twelve) hours.      Marland Kitchen  diltiazem (TIAZAC) 240 MG 24 hr capsule Take 240 mg by mouth daily.      . furosemide (LASIX) 80 MG tablet Take 80 mg by mouth daily.      Marland Kitchen gabapentin (NEURONTIN) 300 MG capsule Take 300 mg by mouth at bedtime.      . Garlic (GARLIQUE) 400 MG TBEC Take 1 tablet by mouth daily.      Marland Kitchen glipiZIDE (GLUCOTROL XL) 5 MG 24 hr tablet Take 5 mg by mouth daily.      Marland Kitchen levofloxacin (LEVAQUIN) 500 MG tablet Take 500 mg by mouth daily. Started 05/17/12        . levothyroxine (SYNTHROID, LEVOTHROID) 88 MCG tablet Take 88 mcg by mouth daily.      . Multiple Vitamins-Minerals (MULTIVITAMIN WITH MINERALS) tablet Take 1 tablet by mouth daily.      . Omega-3 Fatty Acids (FISH OIL) 1000 MG CAPS Take 1 capsule by mouth daily.      Marland Kitchen omeprazole (PRILOSEC) 20 MG capsule Take 20 mg by mouth daily.      . potassium chloride (KLOR-CON) 8 MEQ tablet Take 8 mEq by mouth 2 (two) times daily. For 7 days. Started 05/28/12      . PRESCRIPTION MEDICATION Take 1 tablet by mouth 2 (two) times daily. Methylfolate/Calcium      . Probiotic Product (PROBIOTIC DAILY PO) Take 1 tablet by mouth daily.      . vitamin C (ASCORBIC ACID) 500 MG tablet Take 500 mg by mouth daily.      . zaleplon (SONATA) 5 MG capsule Take 5 mg by mouth at bedtime.        Home: Home Living Lives With: Alone Available Help at Discharge: Personal care attendant;Available 24 hours/day (per pt's daughter) Type of Home: House Home Access: Stairs to enter [employer] of Steps: 8 (Daughter reports family is concerned about number of steps) Entrance Stairs-Rails:  (to be determined) Home Layout: One level Bathroom Shower/Tub: Walk-in shower Home Adaptive Equipment: Environmental consultant - four wheeled;Grab bars in shower (Need more comprehensive info)  Functional History: Prior Function Able to Take Stairs?: Yes (with assistance) Driving: No Functional Status:  Mobility: Bed Mobility Bed Mobility: Rolling Left;Left Sidelying to Sit;Sitting - Scoot to [employer] of Bed Rolling Right: Not tested (comment) Rolling Left: 3: Mod assist;With rail Right Sidelying to Sit: 3: Mod assist;With rails;HOB elevated Left Sidelying to Sit: 1: +2 Total assist;With rails;HOB elevated Left Sidelying to Sit: Patient Percentage: 60% Sitting - Scoot to Edge of Bed: 1: +2 Total assist Sitting - Scoot to Edge of Bed: Patient Percentage: 50% Sit to Supine: Not Tested (comment) Sit to Supine: Patient Percentage:  40% Transfers Transfers: Sit to Stand;Stand to Sit Sit to Stand: 1: +2 Total assist;With upper extremity assist;With armrests;From bed;From chair/3-in-1 Sit to Stand: Patient Percentage: 50% Stand to Sit: 1: +2 Total assist;With upper extremity assist;With armrests;To chair/3-in-1 Stand to Sit: Patient Percentage: 50% Stand Pivot Transfers: 1: +2 Total assist;With armrests Stand Pivot Transfers: Patient Percentage: 50% Ambulation/Gait Ambulation/Gait Assistance: 1: +2 Total assist Ambulation/Gait: Patient Percentage: 70% Ambulation Distance (Feet): 2 Feet Assistive device: Rolling walker Ambulation/Gait Assistance Details: Leans posteriorly needing constant support and manual facilitation to lean anteriorly.  Patient with poor postural stability. Gait Pattern: Step-to pattern;Decreased stride length;Wide base of support;Trunk flexed;Decreased step length - left;Decreased step length - right General Gait Details: poor processing of cues to sequence steps Stairs: No Wheelchair Mobility Wheelchair Mobility: No  ADL:    Cognition: Cognition Arousal/Alertness: Awake/alert Orientation  Level: Oriented X4 Cognition Overall Cognitive Status: Appears within functional limits for tasks assessed/performed Arousal/Alertness: Awake/alert Orientation Level: Appears intact for tasks assessed Behavior During Session: [facility] for tasks performed  Blood pressure 107/63, pulse 89, temperature 99 F (37.2 C), temperature source Oral, resp. rate 24, height 5\' 7"  (1.702 m), weight 87.4 kg (192 lb 10.9 oz), SpO2 98.00%.   Physical Exam:  General: Alert and oriented x 3, No apparent distress. Appears frail. HEENT: Head is normocephalic, atraumatic, PERRLA, EOMI, sclera anicteric, oral mucosa pink and moist, dentition intact, ext ear canals clear,  Neck: Supple without JVD or lymphadenopathy Heart: Reg rate and rhythm. No murmurs rubs or gallops Chest: CTA bilaterally without wheezes, rales, or  rhonchi; no distress, wearing oxygen via [place] Abdomen: Soft, non-tender, non-distended, bowel sounds positive. Extremities: No clubbing, cyanosis, or edema. Pulses are 2+ Skin: Clean, multiple ecchymoses and bruises were seen. Neuro: Pt is cognitively appropriate with normal insight, memory, and awareness. Cranial nerves 2-12 are intact. Sensory exam is normal except for the distal LE's which reveal stocking glove sensory loss. Reflexes are 2+ in all 4's. Fine motor coordination is intact. No tremors. Motor function is grossly 3-4/5 in the upper ext and lower ext today.  Musculoskeletal: Full ROM, No pain with AROM or PROM in the neck, trunk, or extremities. Posture appropriate Psych: Pt's affect is appropriate. Pt is cooperative    Results for orders placed during the hospital encounter of 05/29/12 (from the past 24 hour(s))  GLUCOSE, CAPILLARY     Status: Normal   Collection Time   06/04/12  4:14 PM      Component Value Range   Glucose-Capillary 97  70 - 99 mg/dL  GLUCOSE, CAPILLARY     Status: Abnormal   Collection Time   06/04/12  9:44 PM      Component Value Range   Glucose-Capillary 144 (*) 70 - 99 mg/dL   Comment 1 Notify RN    GLUCOSE, CAPILLARY     Status: Normal   Collection Time   06/05/12  7:48 AM      Component Value Range   Glucose-Capillary 93  70 - 99 mg/dL   Comment 1 Documented in Chart     Comment 2 Notify RN    GLUCOSE, CAPILLARY     Status: Abnormal   Collection Time   06/05/12 11:57 AM      Component Value Range   Glucose-Capillary 138 (*) 70 - 99 mg/dL   Comment 1 Documented in Chart     Comment 2 Notify RN     No results found.  Assessment/Plan: Diagnosis: deconditioning after urosepsis and multiple related medical complications. 1. Does the need for close, 24 hr/day medical supervision in concert with the patient's rehab needs make it unreasonable for this patient to be served in a less intensive setting? Yes 2. Co-Morbidities requiring supervision/potential  complications: Afib with RVR, CHF, OSA, Diabetic peripheral neuropathy. 3. Due to bladder management, bowel management, safety, skin/wound care, disease management, medication administration, pain management and patient education, does the patient require 24 hr/day rehab nursing? Yes 4. Does the patient require coordinated care of a physician, rehab nurse, PT (1-2 hrs/day, 5 days/week) and OT (1-2 hrs/day, 5 days/week) to address physical and functional deficits in the context of the above medical diagnosis(es)? Yes Addressing deficits in the following areas: balance, endurance, locomotion, strength, transferring, bowel/bladder control, bathing, dressing, feeding, grooming, toileting and psychosocial support 5. Can the patient actively participate in an intensive therapy program of  at least 3 hrs of therapy per day at least 5 days per week? Yes 6. The potential for patient to make measurable gains while on inpatient rehab is excellent 7. Anticipated functional outcomes upon discharge from inpatient rehab are supervision to minimal assist with PT, supervision to minimal assist with OT, n/a with SLP. 8. Estimated rehab length of stay to reach the above functional goals is: 2 weeks 9. Does the patient have adequate social supports to accommodate these discharge functional goals? Yes 10. Anticipated D/C setting: Home 11. Anticipated post D/C treatments: HH therapy 12. Overall Rehab/Functional Prognosis: excellent  RECOMMENDATIONS: This patient's condition is appropriate for continued rehabilitative care in the following setting: CIR Patient has agreed to participate in recommended program. Yes Note that insurance prior authorization may be required for reimbursement for recommended care.  Comment:Rehab RN to follow up. Pt was mod I for basic mobility and self-care PTA. Only received help with cooking, house cleaning etc.  Ivory Broad, MD  06/05/2012

## 2012-06-06 ENCOUNTER — Inpatient Hospital Stay (HOSPITAL_COMMUNITY)
Admission: RE | Admit: 2012-06-06 | Discharge: 2012-06-20 | DRG: 945 | Disposition: A | Discharge status: Home Health | Payer: Medicare Other | Source: Ambulatory Visit | Attending: Physical Medicine & Rehabilitation | Admitting: Physical Medicine & Rehabilitation

## 2012-06-06 DIAGNOSIS — R197 Diarrhea, unspecified: Secondary | ICD-10-CM

## 2012-06-06 DIAGNOSIS — E1142 Type 2 diabetes mellitus with diabetic polyneuropathy: Secondary | ICD-10-CM

## 2012-06-06 DIAGNOSIS — K559 Vascular disorder of intestine, unspecified: Secondary | ICD-10-CM

## 2012-06-06 DIAGNOSIS — Z886 Allergy status to analgesic agent status: Secondary | ICD-10-CM

## 2012-06-06 DIAGNOSIS — Z961 Presence of intraocular lens: Secondary | ICD-10-CM

## 2012-06-06 DIAGNOSIS — K5731 Diverticulosis of large intestine without perforation or abscess with bleeding: Secondary | ICD-10-CM

## 2012-06-06 DIAGNOSIS — A498 Other bacterial infections of unspecified site: Secondary | ICD-10-CM

## 2012-06-06 DIAGNOSIS — N390 Urinary tract infection, site not specified: Secondary | ICD-10-CM

## 2012-06-06 DIAGNOSIS — R5381 Other malaise: Secondary | ICD-10-CM

## 2012-06-06 DIAGNOSIS — I10 Essential (primary) hypertension: Secondary | ICD-10-CM

## 2012-06-06 DIAGNOSIS — I4891 Unspecified atrial fibrillation: Secondary | ICD-10-CM

## 2012-06-06 DIAGNOSIS — Z880 Allergy status to penicillin: Secondary | ICD-10-CM

## 2012-06-06 DIAGNOSIS — Z5189 Encounter for other specified aftercare: Secondary | ICD-10-CM

## 2012-06-06 DIAGNOSIS — M109 Gout, unspecified: Secondary | ICD-10-CM

## 2012-06-06 DIAGNOSIS — E876 Hypokalemia: Secondary | ICD-10-CM

## 2012-06-06 DIAGNOSIS — G4700 Insomnia, unspecified: Secondary | ICD-10-CM

## 2012-06-06 DIAGNOSIS — G4733 Obstructive sleep apnea (adult) (pediatric): Secondary | ICD-10-CM | POA: Diagnosis present

## 2012-06-06 DIAGNOSIS — E7800 Pure hypercholesterolemia, unspecified: Secondary | ICD-10-CM

## 2012-06-06 DIAGNOSIS — Z96659 Presence of unspecified artificial knee joint: Secondary | ICD-10-CM

## 2012-06-06 DIAGNOSIS — E039 Hypothyroidism, unspecified: Secondary | ICD-10-CM

## 2012-06-06 DIAGNOSIS — D62 Acute posthemorrhagic anemia: Secondary | ICD-10-CM

## 2012-06-06 DIAGNOSIS — A419 Sepsis, unspecified organism: Secondary | ICD-10-CM | POA: Diagnosis present

## 2012-06-06 DIAGNOSIS — E1149 Type 2 diabetes mellitus with other diabetic neurological complication: Secondary | ICD-10-CM

## 2012-06-06 DIAGNOSIS — I482 Chronic atrial fibrillation, unspecified: Secondary | ICD-10-CM | POA: Diagnosis present

## 2012-06-06 DIAGNOSIS — J45909 Unspecified asthma, uncomplicated: Secondary | ICD-10-CM

## 2012-06-06 DIAGNOSIS — Z79899 Other long term (current) drug therapy: Secondary | ICD-10-CM

## 2012-06-06 DIAGNOSIS — Z8673 Personal history of transient ischemic attack (TIA), and cerebral infarction without residual deficits: Secondary | ICD-10-CM

## 2012-06-06 DIAGNOSIS — I509 Heart failure, unspecified: Secondary | ICD-10-CM

## 2012-06-06 DIAGNOSIS — A4189 Other specified sepsis: Secondary | ICD-10-CM

## 2012-06-06 LAB — GLUCOSE, CAPILLARY
Glucose-Capillary: 149 mg/dL — ABNORMAL HIGH (ref 70–99)
Glucose-Capillary: 134 mg/dL — ABNORMAL HIGH (ref 70–99)

## 2012-06-06 LAB — CULTURE, BLOOD (ROUTINE X 2)
Culture: NO GROWTH
Culture: NO GROWTH

## 2012-06-06 LAB — BASIC METABOLIC PANEL
BUN: 11 mg/dL (ref 6–23)
Chloride: 101 mEq/L (ref 96–112)
Glucose, Bld: 92 mg/dL (ref 70–99)
Potassium: 4.5 mEq/L (ref 3.5–5.1)
Sodium: 136 mEq/L (ref 135–145)

## 2012-06-06 LAB — CBC
HCT: 31.2 % — ABNORMAL LOW (ref 36.0–46.0)
Hemoglobin: 10.7 g/dL — ABNORMAL LOW (ref 12.0–15.0)
MCH: 33 pg (ref 26.0–34.0)
MCHC: 34.3 g/dL (ref 30.0–36.0)
RBC: 3.24 MIL/uL — ABNORMAL LOW (ref 3.87–5.11)

## 2012-06-06 MED ORDER — FLEET ENEMA 7-19 GM/118ML RE ENEM: 1.0000 | ENEMA | Freq: Once | RECTAL | Status: AC | PRN

## 2012-06-06 MED ORDER — BIOTENE DRY MOUTH MT LIQD
15.0000 mL | Freq: Four times a day (QID) | OROMUCOSAL | Status: DC
  Administered 2012-06-07 – 2012-06-20 (×27): 15 mL via OROMUCOSAL

## 2012-06-06 MED ORDER — INSULIN ASPART 100 UNIT/ML ~~LOC~~ SOLN: 0.0000 [IU] | Freq: Three times a day (TID) | SUBCUTANEOUS | Status: DC

## 2012-06-06 MED ORDER — PROCHLORPERAZINE EDISYLATE 5 MG/ML IJ SOLN
5.0000 mg | Freq: Four times a day (QID) | INTRAMUSCULAR | Status: DC | PRN
  Filled 2012-06-06: qty 2

## 2012-06-06 MED ORDER — ATORVASTATIN CALCIUM 10 MG PO TABS
10.0000 mg | ORAL_TABLET | Freq: Every day | ORAL | Status: DC
  Administered 2012-06-07 – 2012-06-19 (×13): 10 mg via ORAL
  Filled 2012-06-06 (×15): qty 1

## 2012-06-06 MED ORDER — WITCH HAZEL-GLYCERIN EX PADS
MEDICATED_PAD | CUTANEOUS | Status: DC | PRN
  Filled 2012-06-06: qty 100

## 2012-06-06 MED ORDER — BIOTENE DRY MOUTH MT LIQD: 15.0000 mL | Freq: Two times a day (BID) | OROMUCOSAL | Status: DC

## 2012-06-06 MED ORDER — DEXTROSE 5 % IV SOLN
1.0000 g | INTRAVENOUS | Status: AC
  Administered 2012-06-07 – 2012-06-11 (×5): 1 g via INTRAVENOUS
  Filled 2012-06-06 (×6): qty 10

## 2012-06-06 MED ORDER — RISAQUAD PO CAPS: 1.0000 | ORAL_CAPSULE | Freq: Two times a day (BID) | ORAL | Status: DC

## 2012-06-06 MED ORDER — ZALEPLON 10 MG PO CAPS
10.0000 mg | ORAL_CAPSULE | Freq: Every day | ORAL | Status: DC
  Administered 2012-06-06 – 2012-06-19 (×14): 10 mg via ORAL

## 2012-06-06 MED ORDER — PROCHLORPERAZINE MALEATE 5 MG PO TABS
5.0000 mg | ORAL_TABLET | Freq: Four times a day (QID) | ORAL | Status: DC | PRN
  Filled 2012-06-06: qty 2

## 2012-06-06 MED ORDER — ALUM & MAG HYDROXIDE-SIMETH 200-200-20 MG/5ML PO SUSP: 30.0000 mL | ORAL | Status: DC | PRN

## 2012-06-06 MED ORDER — NON FORMULARY: 10.0000 mg | Freq: Every day | Status: DC

## 2012-06-06 MED ORDER — AMIODARONE HCL 200 MG PO TABS
200.0000 mg | ORAL_TABLET | Freq: Every day | ORAL | Status: DC
  Administered 2012-06-07 – 2012-06-20 (×14): 200 mg via ORAL
  Filled 2012-06-06 (×16): qty 1

## 2012-06-06 MED ORDER — TRAMADOL HCL 50 MG PO TABS: 50.0000 mg | ORAL_TABLET | Freq: Four times a day (QID) | ORAL | Status: DC | PRN

## 2012-06-06 MED ORDER — PROCHLORPERAZINE 25 MG RE SUPP
12.5000 mg | Freq: Four times a day (QID) | RECTAL | Status: DC | PRN
  Filled 2012-06-06: qty 1

## 2012-06-06 MED ORDER — GABAPENTIN 300 MG PO CAPS
300.0000 mg | ORAL_CAPSULE | Freq: Every day | ORAL | Status: DC
  Administered 2012-06-06 – 2012-06-19 (×14): 300 mg via ORAL
  Filled 2012-06-06 (×15): qty 1

## 2012-06-06 MED ORDER — ENSURE PUDDING PO PUDG
1.0000 | Freq: Two times a day (BID) | ORAL | Status: DC
  Administered 2012-06-07 – 2012-06-20 (×19): 1 via ORAL

## 2012-06-06 MED ORDER — LEVOTHYROXINE SODIUM 88 MCG PO TABS
88.0000 ug | ORAL_TABLET | Freq: Every day | ORAL | Status: DC
  Administered 2012-06-07 – 2012-06-20 (×13): 88 ug via ORAL
  Filled 2012-06-06 (×15): qty 1

## 2012-06-06 MED ORDER — GUAIFENESIN-DM 100-10 MG/5ML PO SYRP: 5.0000 mL | ORAL_SOLUTION | Freq: Four times a day (QID) | ORAL | Status: DC | PRN

## 2012-06-06 MED ORDER — ALLOPURINOL 300 MG PO TABS
300.0000 mg | ORAL_TABLET | Freq: Every day | ORAL | Status: DC
  Administered 2012-06-07 – 2012-06-20 (×14): 300 mg via ORAL
  Filled 2012-06-06 (×15): qty 1

## 2012-06-06 MED ORDER — INSULIN ASPART 100 UNIT/ML ~~LOC~~ SOLN: 0.0000 [IU] | Freq: Every day | SUBCUTANEOUS | Status: DC

## 2012-06-06 MED ORDER — INSULIN ASPART 100 UNIT/ML ~~LOC~~ SOLN
0.0000 [IU] | Freq: Three times a day (TID) | SUBCUTANEOUS | Status: DC
  Administered 2012-06-07: 2 [IU] via SUBCUTANEOUS
  Administered 2012-06-08: 18:00:00 via SUBCUTANEOUS
  Administered 2012-06-08: 2 [IU] via SUBCUTANEOUS
  Administered 2012-06-09: 1 [IU] via SUBCUTANEOUS
  Administered 2012-06-10 (×2): 2 [IU] via SUBCUTANEOUS
  Administered 2012-06-11 – 2012-06-12 (×3): 1 [IU] via SUBCUTANEOUS
  Administered 2012-06-13: 2 [IU] via SUBCUTANEOUS
  Administered 2012-06-14 (×2): 1 [IU] via SUBCUTANEOUS
  Administered 2012-06-15 – 2012-06-16 (×2): 2 [IU] via SUBCUTANEOUS
  Administered 2012-06-16 – 2012-06-18 (×3): 1 [IU] via SUBCUTANEOUS
  Administered 2012-06-18 – 2012-06-19 (×2): 2 [IU] via SUBCUTANEOUS
  Administered 2012-06-19: 1 [IU] via SUBCUTANEOUS
  Administered 2012-06-20: 2 [IU] via SUBCUTANEOUS

## 2012-06-06 MED ORDER — PRAMOXINE HCL 1 % RE OINT
TOPICAL_OINTMENT | Freq: Three times a day (TID) | RECTAL | Status: DC | PRN
  Filled 2012-06-06: qty 30

## 2012-06-06 MED ORDER — DILTIAZEM HCL 30 MG PO TABS
30.0000 mg | ORAL_TABLET | Freq: Four times a day (QID) | ORAL | Status: DC
  Administered 2012-06-06 – 2012-06-16 (×34): 30 mg via ORAL
  Filled 2012-06-06 (×47): qty 1

## 2012-06-06 MED ORDER — ACETAMINOPHEN 325 MG PO TABS
650.0000 mg | ORAL_TABLET | ORAL | Status: DC | PRN
  Administered 2012-06-15 – 2012-06-19 (×7): 650 mg via ORAL
  Filled 2012-06-06 (×8): qty 2

## 2012-06-06 MED ORDER — LEVALBUTEROL HCL 0.63 MG/3ML IN NEBU
0.6300 mg | INHALATION_SOLUTION | RESPIRATORY_TRACT | Status: DC | PRN
  Filled 2012-06-06: qty 3

## 2012-06-06 MED ORDER — BISACODYL 10 MG RE SUPP: 10.0000 mg | Freq: Every day | RECTAL | Status: DC | PRN

## 2012-06-06 MED ORDER — DIPHENHYDRAMINE HCL 12.5 MG/5ML PO ELIX
12.5000 mg | ORAL_SOLUTION | Freq: Four times a day (QID) | ORAL | Status: DC | PRN
  Filled 2012-06-06: qty 10

## 2012-06-06 MED ORDER — FAMOTIDINE 20 MG PO TABS
20.0000 mg | ORAL_TABLET | Freq: Every day | ORAL | Status: DC
  Administered 2012-06-06 – 2012-06-19 (×14): 20 mg via ORAL
  Filled 2012-06-06 (×15): qty 1

## 2012-06-06 MED ORDER — DABIGATRAN ETEXILATE MESYLATE 75 MG PO CAPS
75.0000 mg | ORAL_CAPSULE | Freq: Two times a day (BID) | ORAL | Status: DC
  Administered 2012-06-06 – 2012-06-20 (×28): 75 mg via ORAL
  Filled 2012-06-06 (×30): qty 1

## 2012-06-06 MED ORDER — SACCHAROMYCES BOULARDII 250 MG PO CAPS
250.0000 mg | ORAL_CAPSULE | Freq: Two times a day (BID) | ORAL | Status: DC
  Administered 2012-06-06 – 2012-06-20 (×28): 250 mg via ORAL
  Filled 2012-06-06 (×30): qty 1

## 2012-06-06 NOTE — Progress Notes (Signed)
Patient admitted to room 4029. Patient accompanied by family. Patient and daughter [name] oriented to room, bed controls, routine of rehab, white board, butterfly wish, preferred name, team conference, therapy schedule, safety plan, agreement and video.Please see CH[facility] for details of assessment. [name], [name]

## 2012-06-06 NOTE — PMR Pre-admission (Signed)
PMR Admission Coordinator Pre-Admission Assessment  Patient: [name] is an 78 y.o., female MRN: [id number] DOB: [birth year] Height: 5\' 7"  (170.2 cm) Weight: 86.183 kg (190 lb)  Insurance Information HMO:      PPO:       PCP:       IPA:       80/20:       OTHER:   PRIMARY: Medicare A/B      Policy#: [id number]      Subscriber: [name] CM Name:        Phone#:       Fax#:   Pre-Cert#:        Employer: Retired Benefits:  Phone #:       Name: Armed forces training and education officer. Date: 07/31/91     Deduct: $1184      Out of Pocket Max: none      Life Max: unlimited CIR: 100%      SNF: 100 days Outpatient: 80%     Co-Pay: 20% Home Health: 100%      Co-Pay: none DME: 80%     Co-Pay: 20% Providers: patient's choice  SECONDARY: UHC      Policy#: [id number]      Subscriber: [name] CM Name:        Phone#:       Fax#:   Pre-Cert#:        Employer: Retired Benefits:  Phone #: [phone]     Name:   Eff. Date:       Deduct:        Out of Pocket Max:        Life Max:   CIR:        SNF:   Outpatient:       Co-Pay:   Home Health:        Co-Pay:   DME:       Co-Pay:    Emergency Contact Information Contact Information    Name Relation Home Work [place] Daughter [phone]     Current Medical History  Patient Admitting Diagnosis:  Deconditioning after urosepsis and multiple related medical complications  History of Present Illness:  A 78 y.o. female with multiple chronic medical problems including, peripheral neuropathy, atrial fibrillation, and CHF. She presented to the hospital on 05/29/2012 with epigastric abdominal pain and was subsequently admitted for treatment of a urinary tract infection. Prior to admission she was taking antibiotics at home for persistent UTI. At time of admission she was started on ciprofloxacin. Unfortunately her white blood cell count kept increasing and she subsequently developed bloody diarrhea, hypotension and  atrial fibrillation with RVR. She had limited response to fluids and was transferred to the ICU to initiate Neo-Synephrine drip. Heart rate improved after she was transitioned to an amiodarone infusion. Patient subsequently stabilized. Her bloody diarrhea stopped with restoration of perfusion and normalization of blood pressure. It was felt she had ischemic colitis related to her hypotensive episode. CT of the abdomen and pelvis demonstrated bowel wall thickening of the right colon rectum and distal ileum as well as mesenteric edema. Her urine culture subsequently grew out Escherichia coli that was resistant to Cipro. Her antibiotics were adjusted to Primaxin and she was stable enough to transfer to the step down unit. Cardiology continues to manage her volume status/CHF.   Past Medical History  Past Medical History  Diagnosis Date  . CHF (congestive heart failure)   . Hypertension   . Atrial fibrillation   .  Peripheral neuropathy   . Asthma   . High cholesterol   . Type II diabetes mellitus   . PAD (peripheral artery disease)   . Enlarged heart   . Anginal pain 05/28/12  . Pneumonia 11/2010  . Sleep apnea   . Hypothyroidism   . Headache 05/29/12    "recently"  . Stroke 05/2005; 08/2010    !mini"  . Chronic kidney disease     "something on labs always say she has some problems"  . Personal history of gout     Family History  family history is not on file.  Prior Rehab/Hospitalizations:  Patient says she came to rehab 10 yrs ago after a knee replacement.  Current Medications  Current facility-administered medications:0.9 %  sodium chloride infusion, 250 mL, Intravenous, PRN, Hollice Espy, MD;  acetaminophen (TYLENOL) tablet 650 mg, 650 mg, Oral, Q4H PRN, Hollice Espy, MD, 650 mg at 06/04/12 1530;  acidophilus (RISAQUAD) capsule 1 capsule, 1 capsule, Oral, BID, Lonia Farber, MD, 1 capsule at 06/06/12 1047 allopurinol (ZYLOPRIM) tablet 300 mg, 300 mg, Oral, Daily,  Hollice Espy, MD, 300 mg at 06/06/12 1047;  amiodarone (PACERONE) tablet 200 mg, 200 mg, Oral, Daily, Lupita Leash, MD, 200 mg at 06/06/12 1047;  antiseptic oral rinse (BIOTENE) solution 15 mL, 15 mL, Mouth Rinse, QID, Alyson Reedy, MD, 15 mL at 06/06/12 0548;  atorvastatin (LIPITOR) tablet 10 mg, 10 mg, Oral, Daily, Hollice Espy, MD, 10 mg at 06/06/12 1047 cefTRIAXone (ROCEPHIN) 1 g in dextrose 5 % 50 mL IVPB, 1 g, Intravenous, Q24H, Lonia Blood, MD, 1 g at 06/05/12 1244;  dabigatran (PRADAXA) capsule 75 mg, 75 mg, Oral, Q12H, Russella Dar, NP, 75 mg at 06/06/12 1045;  diltiazem (CARDIZEM) tablet 30 mg, 30 mg, Oral, Q6H, Lonia Blood, MD, 30 mg at 06/06/12 0547;  famotidine (PEPCID) tablet 20 mg, 20 mg, Oral, QHS, Marianne L York, PA, 20 mg at 06/05/12 2312 feeding supplement (ENSURE) pudding 1 Container, 1 Container, Oral, BID BM, Ailene Ards, RD, 1 Container at 06/06/12 1044;  gabapentin (NEURONTIN) capsule 300 mg, 300 mg, Oral, QHS, Hollice Espy, MD, 300 mg at 06/05/12 2312;  insulin aspart (novoLOG) injection 0-9 Units, 0-9 Units, Subcutaneous, TID AC, Lonia Farber, MD levalbuterol (XOPENEX) nebulizer solution 0.63 mg, 0.63 mg, Nebulization, Q2H PRN, Lonia Blood, MD, 0.63 mg at 06/06/12 9811;  levothyroxine (SYNTHROID, LEVOTHROID) tablet 88 mcg, 88 mcg, Oral, QAC breakfast, Hollice Espy, MD, 88 mcg at 06/06/12 0547;  ondansetron (ZOFRAN) injection 4 mg, 4 mg, Intravenous, Q6H PRN, Hollice Espy, MD, 4 mg at 06/05/12 0008 pramoxine-mineral oil-zinc (TUCK'S) rectal ointment, , Rectal, TID PRN, Hollice Espy, MD;  sodium chloride 0.9 % injection 3 mL, 3 mL, Intravenous, Q12H, Hollice Espy, MD, 3 mL at 06/05/12 2317;  sodium chloride 0.9 % injection 3 mL, 3 mL, Intravenous, PRN, Hollice Espy, MD;  witch hazel-glycerin (TUCKS) pad, , Topical, PRN, Hollice Espy, MD zolpidem (AMBIEN) tablet 5 mg, 5 mg, Oral, QHS, Hollice Espy, MD, 5 mg at 06/05/12 2312  Patients Current Diet: [name]  Precautions / Restrictions Precautions Precautions: Fall Precaution Comments: Watch HR Restrictions Weight Bearing Restrictions: No Other Position/Activity Restrictions: Mobilize very gently with respect to vitals   Prior Activity Level Community (5-7x/wk): Went out [employer] a week  Journalist, newspaper / Corporate investment banker Devices/Equipment: Eyeglasses;Walker (specify type);CBG Meter;CPAP;Raised toilet seat with rails;Grab bars in shower;Grab bars around toilet (  walker w/4 wheels) Home Adaptive Equipment: Raised toilet seat with rails;Reacher;Straight cane;Grab bars in shower;Walker - four wheeled  Prior Functional Level Prior Function Level of Independence: Needs assistance Needs Assistance: Bathing;Meal Prep;Light Housekeeping Bath: Supervision/set-up Meal Prep: Total Light Housekeeping: Total Able to Take Stairs?: Yes Driving: No Vocation: Retired  Current Functional Level Cognition  Arousal/Alertness: Awake/alert Overall Cognitive Status: Appears within functional limits for tasks assessed/performed Orientation Level: Oriented X4    Extremity Assessment (includes Sensation/Coordination)  RUE ROM/Strength/Tone: Deficits RUE ROM/Strength/Tone Deficits: AROM approx 90 degrees in both shoulders.   RLE ROM/Strength/Tone: Deficits RLE ROM/Strength/Tone Deficits: Generalized weakness    ADLs  Upper Body Dressing: Simulated;Set up Where Assessed - Upper Body Dressing: Supported sitting Toilet Transfer: Performed;Moderate assistance Toilet Transfer Method: Sit to stand Toilet Transfer Equipment: Raised toilet seat with arms (or 3-in-1 over toilet) Toileting - Clothing Manipulation and Hygiene: Performed;+1 Total assistance Where Assessed - Toileting Clothing Manipulation and Hygiene: Standing Equipment Used: Gait belt;Rolling walker Transfers/Ambulation Related to ADLs: Pt. required +2 total A to  ambulate with extensive tactile and vc's to ambulate back to bed from 3 in 1. Pt. needed facilitation at the hips to step and vc's as well. ADL Comments: Pt. verbalized the need to use the bathroom when OT entered room. Pt. performed sit to stand from bed with Mod A. Pt. required +1 total A for hygiene. When standing from 3 in 1 pt. required Mod A but +2 total A to ambulate to bed.     Mobility  Bed Mobility: Rolling Right;Right Sidelying to Sit;Sitting - Scoot to [place] of Bed Rolling Right: 3: Mod assist Rolling Left: 3: Mod assist;With rail Right Sidelying to Sit: 3: Mod assist;With rails;HOB elevated Left Sidelying to Sit: 1: +2 Total assist;With rails;HOB elevated Left Sidelying to Sit: Patient Percentage: 60% Sitting - Scoot to Edge of Bed: 1: +1 Total assist Sitting - Scoot to Edge of Bed: Patient Percentage: 50% Sit to Supine: Not Tested (comment) Sit to Supine: Patient Percentage: 40%    Transfers  Transfers: Sit to Stand;Stand to Sit Sit to Stand: 3: Mod assist;From bed;From toilet Sit to Stand: Patient Percentage: 50% Stand to Sit: 3: Mod assist;With upper extremity assist;To bed;To chair/3-in-1 Stand to Sit: Patient Percentage: 50% Stand Pivot Transfers: 1: +2 Total assist;With armrests Stand Pivot Transfers: Patient Percentage: 50%    Ambulation / Gait / Stairs / Wheelchair Mobility  Ambulation/Gait Ambulation/Gait Assistance: 1: +2 Total assist Ambulation/Gait: Patient Percentage: 70% Ambulation Distance (Feet): 2 Feet Assistive device: Rolling walker Ambulation/Gait Assistance Details: Leans posteriorly needing constant support and manual facilitation to lean anteriorly.  Patient with poor postural stability. Gait Pattern: Step-to pattern;Decreased stride length;Wide base of support;Trunk flexed;Decreased step length - left;Decreased step length - right General Gait Details: poor processing of cues to sequence steps Stairs: No Wheelchair Mobility Wheelchair Mobility:  No    Posture / Balance Static Sitting Balance Static Sitting - Balance Support: Feet supported;Bilateral upper extremity supported Static Sitting - Level of Assistance: 3: Mod assist Static Sitting - Comment/# of Minutes: 2 min, leaning posteriorly today Static Standing Balance Static Standing - Balance Support: Bilateral upper extremity supported;During functional activity Static Standing - Level of Assistance: 1: +2 Total assist (pt = 50-65%) Static Standing - Comment/# of Minutes: 2 minutes x2 to be cleaned.  Leans posteriorly needing incr assist to maintain balance.     Previous Home Environment Living Arrangements: Alone Lives With: Alone Available Help at Discharge: Personal care attendant;Available 24 hours/day Type of Home: House  Home Layout: One level Home Access: Stairs to enter Entrance Stairs-Rails:  (to be determined) Entrance Stairs-Number of Steps: 8 Bathroom Shower/Tub: Health visitor: Handicapped height Bathroom Accessibility: Yes How Accessible: Accessible via walker Home Care Services: Yes Type of Home Care Services: Homehealth aide;Safety alert ("[facility]") Home Care Agency (if known): [facility]  Discharge Living Setting Plans for Discharge Living Setting: Patient's home;House;Lives with (comment) (Has paid caregivers around the clock.) Type of Home at Discharge: House Discharge Home Layout: One level Discharge Home Access: Stairs to enter Entrance Stairs-Number of Steps: 5 Do you have any problems obtaining your medications?: No  Social/Family/Support Systems Patient Roles: Parent Contact Information: Sammuel Bailiff - Dtr (h) [phone] (Has 2 Dtrs and a son.  Gwynn Burly - Dtr.) Anticipated Caregiver: Has paid caregivers 24/7 hrs per day Ability/Limitations of Caregiver: Dtr works, has paid caregivers. Caregiver Availability: 24/7 Discharge Plan Discussed with Primary Caregiver: Yes Is Caregiver In Agreement with Plan?:  Yes Does Caregiver/Family have Issues with Lodging/Transportation while Pt is in Rehab?: No  Goals/Additional Needs Patient/Family Goal for Rehab: PT S/min A, OT S/Min A, no ST needed Expected length of stay: 2 weeks Cultural Considerations: [employer] Dietary Needs: Bland diet Equipment Needs: TBD Pt/Family Agrees to Admission and willing to participate: Yes Program Orientation Provided & Reviewed with Pt/Caregiver Including Roles  & Responsibilities: Yes  Patient Condition: This patient's condition remains as documented in the Consult dated 06/05/12, in which the Rehabilitation Physician determined and documented that the patient's condition is appropriate for intensive rehabilitative care in an inpatient rehabilitation facility.  Preadmission Screen Completed By:  Trish Mage, 06/06/2012 12:46 PM ______________________________________________________________________   Discussed status with Dr. Riley Kill on 06/06/12 at 1254 and received telephone approval for admission today.  Admission Coordinator:  Trish Mage, time1255/Date08/08/13

## 2012-06-06 NOTE — Progress Notes (Signed)
Rehab admissions - Evaluated for possible admission.  I spoke with patient and she would like to come to inpatient rehab.  I spoke with attending MD who will see patient soon.  If patient deemed to be medically stable by MD, I can admit to inpatient rehab today.  I do have beds available on inpatient rehab today.  Call me for questions.  #[phone]

## 2012-06-06 NOTE — Progress Notes (Signed)
Physical Therapy Treatment Patient Details Name: [name] MRN: [id number] DOB: 1924/07/11 Today's Date: 06/06/2012 Time: 0922-0949 PT Time Calculation (min): 27 min  PT Assessment / Plan / Recommendation Comments on Treatment Session  Pt admitted s/p UTI with SOB and continues to be motivated to participate with therapy.  Pt able to increase ambulation distance today, but remains limited by decreased endurance.    Follow Up Recommendations  Inpatient Rehab;Supervision/Assistance - 24 hour    Barriers to Discharge        Equipment Recommendations  Defer to next venue    Recommendations for Other Services Rehab consult  Frequency Min 3X/week   Plan Discharge plan remains appropriate;Frequency remains appropriate    Precautions / Restrictions Precautions Precautions: Fall Precaution Comments: Watch HR Restrictions Weight Bearing Restrictions: No   Pertinent Vitals/Pain None    Mobility  Bed Mobility Bed Mobility: Sit to Supine Rolling Right: 3: Mod assist Right Sidelying to Sit: 3: Mod assist;With rails;HOB elevated Sitting - Scoot to Edge of Bed: 1: +1 Total assist Sit to Supine: 2: Max assist;HOB flat Details for Bed Mobility Assistance: Assist for bilateral LEs to raise onto bed as well as trunk to slow descent to bed.  Cues for sequence. Transfers Transfers: Sit to Stand;Stand to Sit (2 trials) Sit to Stand: 3: Mod assist;With upper extremity assist;From bed Stand to Sit: 3: Mod assist;With upper extremity assist;To bed Details for Transfer Assistance: Assist as ischials to translate trunk anterior over BOS with cues for safest hand placement during standing and sitting.  Cues to also slow descent to surface. Ambulation/Gait Ambulation/Gait Assistance: 4: Min assist Ambulation Distance (Feet): 10 Feet Assistive device: Rolling walker Ambulation/Gait Assistance Details: Assist to extend trunk at chest as well as hips to achieve tall, upright posture.  Assist  also for balance especially during swing phase of gait. Distance limited by fatigue. Max cues to increase bilateral LE step length. Gait Pattern: Decreased stride length;Trunk flexed;Shuffle Stairs: No Wheelchair Mobility Wheelchair Mobility: No    Exercises General Exercises - Lower Extremity Ankle Circles/Pumps: AROM;Both;10 reps;Supine Quad Sets: AROM;Both;10 reps;Supine Heel Slides: AROM;Both;10 reps;Supine   PT Diagnosis:    PT Problem List:   PT Treatment Interventions:     PT Goals Acute Rehab PT Goals PT Goal Formulation: With patient/family Time For Goal Achievement: 06/16/12 Potential to Achieve Goals: Fair PT Goal: Sit to Supine/Side - Progress: Progressing toward goal PT Goal: Sit to Stand - Progress: Progressing toward goal PT Goal: Stand to Sit - Progress: Progressing toward goal PT Goal: Ambulate - Progress: Progressing toward goal  Visit Information  Last PT Received On: 06/06/12 Assistance Needed: +2 (Especially with fatigue)    Subjective Data  Subjective: "I am a little tired now, but we can try." Patient Stated Goal: get better   Cognition  Overall Cognitive Status: Appears within functional limits for tasks assessed/performed Arousal/Alertness: Awake/alert Orientation Level: Appears intact for tasks assessed Behavior During Session: [facility] for tasks performed    Balance  Balance Balance Assessed: No  End of Session PT - End of Session Equipment Utilized During Treatment: Gait belt;Oxygen Activity Tolerance: Patient tolerated treatment well;Patient limited by fatigue Patient left: in bed;with call bell/phone within reach;with bed alarm set Nurse Communication: Mobility status   GP     Cephus Shelling 06/06/2012, 12:53 PM  06/06/2012 Cephus Shelling, PT, DPT [phone]

## 2012-06-06 NOTE — Progress Notes (Signed)
Subjective:  Resting no complaints. No CP, no SOB  Objective:  Vital Signs in the last 24 hours: Temp:  [98.4 F (36.9 C)-99 F (37.2 C)] 98.4 F (36.9 C) (08/08 0500) Pulse Rate:  [89-105] 105  (08/08 0500) Resp:  [24] 24  (08/07 1439) BP: (105-116)/(63-89) 105/70 mmHg (08/08 0500) SpO2:  [95 %-99 %] 99 % (08/08 0500) Weight:  [86.183 kg (190 lb)] 86.183 kg (190 lb) (08/08 0500)  Intake/Output from previous day: 08/07 0701 - 08/08 0700 In: 460 [P.O.:360; IV Piggyback:100] Out: 402 [Urine:400; Stool:2]   Physical Exam: General: Well developed, well nourished, in no acute distress. Head:  Normocephalic and atraumatic. Lungs: Clear to auscultation and percussion. Heart: Irreg, irreg, normal rate, 2/6 S murmur RUSB, no rubs or gallops.  Abdomen: soft, non-tender, positive bowel sounds. Extremities: No clubbing or cyanosis. No edema. Neurologic: Alert and oriented x 3.    Lab Results:  Basename 06/06/12 0520 06/04/12 0445  WBC 9.6 11.7*  HGB 10.7* 12.4  PLT 191 198    Basename 06/06/12 0520 06/04/12 0445  NA 136 129*  K 4.5 4.3  CL 101 97  CO2 29 19  GLUCOSE 92 75  BUN 11 9  CREATININE 0.85 0.69    Telemetry: AFIB rate cont.  Personally viewed.    Assessment/Plan:  Principal Problem:  *E. coli UTI Active Problems:  Chest pain  Atrial fibrillation with RVR  Diastolic heart failure, NYHA class 1  Hypothyroidism  Hypokalemia  Hyponatremia  Sepsis  Ischemic colitis  Bloody diarrhea  Acute respiratory failure with hypoxia  Hypotension  OSA (obstructive sleep apnea)  Pulmonary edema  Leukocytosis  1. AFIB - rate controlled with amiodarone. Now PO. Off of diltiazem as she was taking at home. Stopped because of hypotension. No changes. Doing well.  Will monitor TSH, LFT as outpt.   2. Chronic anticoagulation - Pradaxa, per Dr. Mayford Knife.   3. HL - atorvastatin. No changes.   4. Acute diastolic heart failure - comfortable. NYHA 1-2. No orthopnea. No  further lasix needed at this time. Weight monitor.   5. Moderate Aortic stenosis - murmur heard. No changes.     SKAINS, MARK 06/06/2012, 10:26 AM

## 2012-06-06 NOTE — Progress Notes (Signed)
Occupational Therapy Evaluation Patient Details Name: [name] MRN: [id number] DOB: 1924/02/18 Today's Date: 06/06/2012 Time: 1191-4782 OT Time Calculation (min): 39 min  OT Assessment / Plan / Recommendation Clinical Impression  78 yo female admitted with weakness, UTI, then diarrhea, and afib with RVR and hypotension presents with decr activity tolerance, and decr functional mobility. Pt. will benefit from OT to maximize indpendence and safety in ADLs prior to d/c. Recommend CIR.     OT Assessment  Patient needs continued OT Services    Follow Up Recommendations  Inpatient Rehab    Barriers to Discharge      Equipment Recommendations  Defer to next venue    Recommendations for Other Services    Frequency  Min 2X/week    Precautions / Restrictions Precautions Precautions: Fall Precaution Comments: Watch HR Restrictions Weight Bearing Restrictions: No   Pertinent Vitals/Pain HR reached 100 while sitting EOB after activity. Pain in buttock at tailbone.     ADL  Upper Body Dressing: Simulated;Set up Where Assessed - Upper Body Dressing: Supported sitting Toilet Transfer: Performed;Moderate assistance Toilet Transfer Method: Sit to stand Toilet Transfer Equipment: Raised toilet seat with arms (or 3-in-1 over toilet) Toileting - Clothing Manipulation and Hygiene: Performed;+1 Total assistance Where Assessed - Toileting Clothing Manipulation and Hygiene: Standing Equipment Used: Gait belt;Rolling walker Transfers/Ambulation Related to ADLs: Pt. required +2 total A to ambulate with extensive tactile and vc's to ambulate back to bed from 3 in 1. Pt. needed facilitation at the hips to step and vc's as well. ADL Comments: Pt. verbalized the need to use the bathroom when OT entered room. Pt. performed sit to stand from bed with Mod A. Pt. required +1 total A for hygiene. When standing from 3 in 1 pt. required Mod A but +2 total A to ambulate to bed.     OT Diagnosis:  Generalized weakness;Acute pain  OT Problem List: Decreased strength;Decreased activity tolerance;Impaired balance (sitting and/or standing);Decreased knowledge of use of DME or AE;Cardiopulmonary status limiting activity;Pain OT Treatment Interventions: Self-care/ADL training;DME and/or AE instruction;Patient/family education;Balance training;Therapeutic activities   OT Goals Acute Rehab OT Goals OT Goal Formulation: With patient Time For Goal Achievement: 06/20/12 Potential to Achieve Goals: Good ADL Goals Pt Will Perform Lower Body Bathing: Sit to stand from chair;with min assist ADL Goal: Lower Body Bathing - Progress: Goal set today Pt Will Perform Lower Body Dressing: with min assist;Sit to stand from chair;Sit to stand from bed ADL Goal: Lower Body Dressing - Progress: Goal set today Pt Will Transfer to Toilet: with min assist;Ambulation;3-in-1 ADL Goal: Toilet Transfer - Progress: Goal set today Pt Will Perform Toileting - Clothing Manipulation: with min assist;Standing ADL Goal: Toileting - Clothing Manipulation - Progress: Goal set today Pt Will Perform Toileting - Hygiene: with min assist;Sit to stand from 3-in-1/toilet ADL Goal: Toileting - Hygiene - Progress: Progressing toward goals Arm Goals Pt Will Perform AROM: with supervision, verbal cues required/provided;2 sets;Bilateral upper extremities Arm Goal: AROM - Progress: Goal set today  Visit Information  Last OT Received On: 06/06/12 Assistance Needed: +2    Subjective Data  Subjective: "I am weak as water."   Prior Functioning  Vision/Perception  Home Living Lives With: Alone Available Help at Discharge: Personal care attendant;Available 24 hours/day Type of Home: House Home Access: Stairs to enter [employer] of Steps: 8 Home Layout: One level Bathroom Shower/Tub: Health visitor: Handicapped height Bathroom Accessibility: Yes How Accessible: Accessible via walker Home Adaptive  Equipment: Raised toilet seat  with rails;Reacher;Straight cane;Grab bars in shower;Walker - four wheeled Prior Function Level of Independence: Needs assistance Needs Assistance: Bathing;Meal Prep;Light Housekeeping Bath: Supervision/set-up Meal Prep: Total Light Housekeeping: Total Able to Take Stairs?: Yes Driving: No Vocation: Retired Musician: No difficulties Dominant Hand: Right      Cognition  Overall Cognitive Status: Appears within functional limits for tasks assessed/performed Arousal/Alertness: Awake/alert Orientation Level: Appears intact for tasks assessed Behavior During Session: [facility] for tasks performed    Extremity/Trunk Assessment Right Upper Extremity Assessment RUE ROM/Strength/Tone: Deficits RUE ROM/Strength/Tone Deficits: AROM approx 90 degrees in both shoulders.  Left Upper Extremity Assessment LUE ROM/Strength/Tone: Deficits LUE ROM/Strength/Tone Deficits: AROM approx 90 degrees in both shoulders   Mobility Bed Mobility Bed Mobility: Rolling Right;Right Sidelying to Sit;Sitting - Scoot to Edge of Bed Rolling Right: 3: Mod assist Right Sidelying to Sit: 3: Mod assist;With rails;HOB elevated Sitting - Scoot to Edge of Bed: 1: +1 Total assist Details for Bed Mobility Assistance: cues for sequencing and technique. Pt. with weakness and needed physical assist to lift trunk and scoot to EOB.  Transfers Transfers: Sit to Stand;Stand to Sit Sit to Stand: 3: Mod assist;From bed;From toilet Stand to Sit: 3: Mod assist;With upper extremity assist;To bed;To chair/3-in-1 Details for Transfer Assistance: Pt. had difficult standing upright and required tactile and vc's for hand placement and technique.            End of Session OT - End of Session Activity Tolerance: Patient tolerated treatment well Patient left: Other (comment) (sitting EOB with PT)  GO     Jenell Milliner 06/06/2012, 10:37 AM

## 2012-06-06 NOTE — Progress Notes (Signed)
Addendum- barrier cream and lotion applied to buttocks due to redness  I agree with the following treatment note after reviewing documentation.   Harrel Carina [place]   OTR/L Pager: [phone] Office: [phone] .

## 2012-06-06 NOTE — H&P (Signed)
Physical Medicine and Rehabilitation Admission H&P  Chief Complaint   Patient presents with   .  Generalized weakness   :  HPI: [name] is a 78 y.o. female with multiple chronic medical problems including, peripheral neuropathy, atrial fibrillation, and CHF. She presented to the hospital on 05/29/2012 with epigastric abdominal pain and was subsequently admitted for treatment of a urinary tract infection. Prior to admission she was taking antibiotics at home for persistent UTI. At time of admission she was started on ciprofloxacin. Unfortunately her white blood cell count kept increasing and she subsequently developed bloody diarrhea, hypotension and atrial fibrillation with RVR. She had limited response to fluids and was transferred to the ICU to initiate Neo-Synephrine drip. Heart rate improved after she was transitioned to an amiodarone infusion. Patient subsequently stabilized. Her bloody diarrhea stopped with restoration of perfusion and normalization of blood pressure. It was felt she had ischemic colitis related to her hypotensive episode. CT of the abdomen and pelvis demonstrated bowel wall thickening of the right colon rectum and distal ileum as well as mesenteric edema. Her urine culture subsequently grew out Escherichia coli that was resistant to Cipro. Her antibiotics were adjusted to Primaxin and she was stable enough to transfer to the step down unit. Dr. Mayford Knife following for input on CHF and A Fib. Acute on chronic CHF compensated and no need for diuretics. Atrial fibrillation controlled on po amiodarone and Cardizem. Pradaxa resumed yesterday. Antibiotics changed to rocephin with recommendations to continue for five additional days. Evaluated by therapy team and CIR recommended for progression.  Review of Systems  Constitutional:  Poor appetite.  HENT: Negative for hearing loss.  Eyes: Negative for blurred vision and double vision.  Respiratory: Positive for shortness of breath  (DOE) and wheezing.  Cardiovascular: Negative for chest pain and palpitations.  Gastrointestinal: Positive for diarrhea. Negative for heartburn, nausea and vomiting.  Genitourinary: Negative for urgency and frequency.  Musculoskeletal: Negative for back pain and joint pain.  Neurological: Positive for weakness. Negative for dizziness and headaches.  Psychiatric/Behavioral: Negative for depression. The patient has insomnia. The patient is not nervous/anxious.   Past Medical History   Diagnosis  Date   .  CHF (congestive heart failure)    .  Hypertension    .  Atrial fibrillation    .  Peripheral neuropathy    .  Asthma    .  High cholesterol    .  Type II diabetes mellitus    .  PAD (peripheral artery disease)    .  Enlarged heart    .  Anginal pain  05/28/12   .  Pneumonia  11/2010   .  Sleep apnea    .  Hypothyroidism    .  Headache  05/29/12     "recently"   .  Stroke  05/2005; 08/2010     !mini"   .  Chronic kidney disease      "something on labs always say she has some problems"   .  Personal history of gout     Past Surgical History   Procedure  Date   .  Tonsillectomy and adenoidectomy  1960's   .  Dilation and curettage of uterus    .  Vaginal hysterectomy  1970's   .  Total knee arthroplasty  1970-~2002     left; right   .  Cataract extraction w/ intraocular lens implant, bilateral  ?1970's    History reviewed. No pertinent family history.  Social  History: Has sitters around the clock who provide 24 hours supervision. Daughter assists with baths. She reports that she has never smoked. She has never used smokeless tobacco. She reports that she does not drink alcohol or use illicit drugs.  Allergies   Allergen  Reactions   .  Aspirin  Other (See Comments)     Tears my stomach up; "can take coated Aspirin qd without problems"   .  Nexium (Esomeprazole Magnesium)  Rash   .  Penicillins  Rash     Tolerates Rocephin    Scheduled Meds:  .  acidophilus  1 capsule  Oral   BID   .  allopurinol  300 mg  Oral  Daily   .  amiodarone  200 mg  Oral  Daily   .  antiseptic oral rinse  15 mL  Mouth Rinse  QID   .  atorvastatin  10 mg  Oral  Daily   .  cefTRIAXone (ROCEPHIN) IV  1 g  Intravenous  Q24H   .  dabigatran  75 mg  Oral  Q12H   .  diltiazem  30 mg  Oral  Q6H   .  famotidine  20 mg  Oral  QHS   .  feeding supplement  1 Container  Oral  BID BM   .  gabapentin  300 mg  Oral  QHS   .  insulin aspart  0-9 Units  Subcutaneous  TID AC   .  levothyroxine  88 mcg  Oral  QAC breakfast   .  sodium chloride  3 mL  Intravenous  Q12H   .  zolpidem  5 mg  Oral  QHS    Medications Prior to Admission   Medication  Sig  Dispense  Refill   .  allopurinol (ZYLOPRIM) 300 MG tablet  Take 300 mg by mouth daily.     Marland Kitchen  aspirin EC 81 MG tablet  Take 81 mg by mouth daily.     Marland Kitchen  atorvastatin (LIPITOR) 10 MG tablet  Take 10 mg by mouth daily.     .  Calcium Carb-Cholecalciferol (CALCIUM + D3) 600-200 MG-UNIT TABS  Take 1 tablet by mouth daily.     .  dabigatran (PRADAXA) 75 MG CAPS  Take 75 mg by mouth every 12 (twelve) hours.     Marland Kitchen  diltiazem (TIAZAC) 240 MG 24 hr capsule  Take 240 mg by mouth daily.     .  furosemide (LASIX) 80 MG tablet  Take 80 mg by mouth daily.     Marland Kitchen  gabapentin (NEURONTIN) 300 MG capsule  Take 300 mg by mouth at bedtime.     .  Garlic (GARLIQUE) 400 MG TBEC  Take 1 tablet by mouth daily.     Marland Kitchen  glipiZIDE (GLUCOTROL XL) 5 MG 24 hr tablet  Take 5 mg by mouth daily.     Marland Kitchen  levofloxacin (LEVAQUIN) 500 MG tablet  Take 500 mg by mouth daily. Started 05/17/12     .  levothyroxine (SYNTHROID, LEVOTHROID) 88 MCG tablet  Take 88 mcg by mouth daily.     .  Multiple Vitamins-Minerals (MULTIVITAMIN WITH MINERALS) tablet  Take 1 tablet by mouth daily.     .  Omega-3 Fatty Acids (FISH OIL) 1000 MG CAPS  Take 1 capsule by mouth daily.     Marland Kitchen  omeprazole (PRILOSEC) 20 MG capsule  Take 20 mg by mouth daily.     .  potassium chloride (  KLOR-CON) 8 MEQ tablet  Take 8 mEq by  mouth 2 (two) times daily. For 7 days. Started 05/28/12     .  PRESCRIPTION MEDICATION  Take 1 tablet by mouth 2 (two) times daily. Methylfolate/Calcium     .  Probiotic Product (PROBIOTIC DAILY PO)  Take 1 tablet by mouth daily.     .  vitamin C (ASCORBIC ACID) 500 MG tablet  Take 500 mg by mouth daily.     .  zaleplon (SONATA) 5 MG capsule  Take 5 mg by mouth at bedtime.      Home:  Home Living  Lives With: Alone  Available Help at Discharge: Personal care attendant;Available 24 hours/day  Type of Home: House  Home Access: Stairs to enter  [employer] of Steps: 8  Entrance Stairs-Rails: (to be determined)  Home Layout: One level  Bathroom Shower/Tub: Pension scheme manager: Handicapped height  Bathroom Accessibility: Yes  How Accessible: Accessible via walker  Home Adaptive Equipment: Raised toilet seat with rails;Reacher;Straight cane;Grab bars in shower;Walker - four wheeled  Functional History:  Prior Function  Bath: Supervision/set-up  Meal Prep: Total  Light Housekeeping: Total  Able to Take Stairs?: Yes  Driving: No  Vocation: Retired  Functional Status:  Mobility:  Bed Mobility  Bed Mobility: Rolling Right;Right Sidelying to Sit;Sitting - Scoot to [place] of Bed  Rolling Right: 3: Mod assist  Rolling Left: 3: Mod assist;With rail  Right Sidelying to Sit: 3: Mod assist;With rails;HOB elevated  Left Sidelying to Sit: 1: +2 Total assist;With rails;HOB elevated  Left Sidelying to Sit: Patient Percentage: 60%  Sitting - Scoot to Edge of Bed: 1: +1 Total assist  Sitting - Scoot to Edge of Bed: Patient Percentage: 50%  Sit to Supine: Not Tested (comment)  Sit to Supine: Patient Percentage: 40%  Transfers  Transfers: Sit to Stand;Stand to Sit  Sit to Stand: 3: Mod assist;From bed;From toilet  Sit to Stand: Patient Percentage: 50%  Stand to Sit: 3: Mod assist;With upper extremity assist;To bed;To chair/3-in-1  Stand to Sit: Patient Percentage: 50%  Stand  Pivot Transfers: 1: +2 Total assist;With armrests  Stand Pivot Transfers: Patient Percentage: 50%  Ambulation/Gait  Ambulation/Gait Assistance: 1: +2 Total assist  Ambulation/Gait: Patient Percentage: 70%  Ambulation Distance (Feet): 2 Feet  Assistive device: Rolling walker  Ambulation/Gait Assistance Details: Leans posteriorly needing constant support and manual facilitation to lean anteriorly. Patient with poor postural stability.  Gait Pattern: Step-to pattern;Decreased stride length;Wide base of support;Trunk flexed;Decreased step length - left;Decreased step length - right  General Gait Details: poor processing of cues to sequence steps  Stairs: No  Wheelchair Mobility  Wheelchair Mobility: No  ADL:  ADL  Upper Body Dressing: Simulated;Set up  Where Assessed - Upper Body Dressing: Supported sitting  Toilet Transfer: Performed;Moderate assistance  Toilet Transfer Method: Sit to stand  Toilet Transfer Equipment: Raised toilet seat with arms (or 3-in-1 over toilet)  Equipment Used: Gait belt;Rolling walker  Transfers/Ambulation Related to ADLs: Pt. required +2 total A to ambulate with extensive tactile and vc's to ambulate back to bed from 3 in 1. Pt. needed facilitation at the hips to step and vc's as well.  ADL Comments: Pt. verbalized the need to use the bathroom when OT entered room. Pt. performed sit to stand from bed with Mod A. Pt. required +1 total A for hygiene. When standing from 3 in 1 pt. required Mod A but +2 total A to ambulate to bed.  Cognition:  Cognition  Arousal/Alertness: Awake/alert  Orientation Level: Oriented X4  Cognition  Overall Cognitive Status: Appears within functional limits for tasks assessed/performed  Arousal/Alertness: Awake/alert  Orientation Level: Appears intact for tasks assessed  Behavior During Session: [facility] for tasks performed  Blood pressure 105/70, pulse 105, temperature 98.4 F (36.9 C), temperature source Oral, resp. rate 24, height 5\' 7"   (1.702 m), weight 86.183 kg (190 lb), SpO2 98.00%.  Physical Exam  Nursing note and vitals reviewed.  Constitutional: She is oriented to person, place, and time. She appears well-developed and well-nourished.  HENT:  Head: Normocephalic and atraumatic.  Eyes: Pupils are equal, round, and reactive to light.  Neck: Normal range of motion. Neck supple.  Cardiovascular: Normal rate. An irregularly irregular rhythm present.  Pulmonary/Chest: She has wheezes (audible, upper airway sounds).  Abdominal: Soft. Bowel sounds are normal.  Musculoskeletal: She exhibits no edema and no tenderness.  Neurological: She is alert and oriented to person, place, and time. Strength grossly 3 prox to 3-4 distally . No sensory deficits. DTR's are 1+. Skin: Skin is warm and dry.  Psychiatric: She has a normal mood and affect. Thought content normal.   Results for orders placed during the hospital encounter of 05/29/12 (from the past 48 hour(s))   GLUCOSE, CAPILLARY Status: Normal    Collection Time    06/04/12 4:14 PM   Component  Value  Range  Comment    Glucose-Capillary  97  70 - 99 mg/dL    GLUCOSE, CAPILLARY Status: Abnormal    Collection Time    06/04/12 9:44 PM   Component  Value  Range  Comment    Glucose-Capillary  144 (*)  70 - 99 mg/dL     Comment 1  Notify RN     GLUCOSE, CAPILLARY Status: Normal    Collection Time    06/05/12 7:48 AM   Component  Value  Range  Comment    Glucose-Capillary  93  70 - 99 mg/dL     Comment 1  Documented in Chart      Comment 2  Notify RN     GLUCOSE, CAPILLARY Status: Abnormal    Collection Time    06/05/12 11:57 AM   Component  Value  Range  Comment    Glucose-Capillary  138 (*)  70 - 99 mg/dL     Comment 1  Documented in Chart      Comment 2  Notify RN     GLUCOSE, CAPILLARY Status: Normal    Collection Time    06/05/12 4:43 PM   Component  Value  Range  Comment    Glucose-Capillary  94  70 - 99 mg/dL    GLUCOSE, CAPILLARY Status: Abnormal    Collection Time     06/05/12 9:03 PM   Component  Value  Range  Comment    Glucose-Capillary  132 (*)  70 - 99 mg/dL     Comment 1  Notify RN     BASIC METABOLIC PANEL Status: Abnormal    Collection Time    06/06/12 5:20 AM   Component  Value  Range  Comment    Sodium  136  135 - 145 mEq/L     Potassium  4.5  3.5 - 5.1 mEq/L     Chloride  101  96 - 112 mEq/L     CO2  29  19 - 32 mEq/L     Glucose, Bld  92  70 - 99 mg/dL  BUN  11  6 - 23 mg/dL     Creatinine, Ser  8.11  0.50 - 1.10 mg/dL     Calcium  7.9 (*)  8.4 - 10.5 mg/dL     GFR calc non Af Amer  61 (*)  >90 mL/min     GFR calc Af Amer  70 (*)  >90 mL/min    CBC Status: Abnormal    Collection Time    06/06/12 5:20 AM   Component  Value  Range  Comment    WBC  9.6  4.0 - 10.5 K/uL     RBC  3.24 (*)  3.87 - 5.11 MIL/uL     Hemoglobin  10.7 (*)  12.0 - 15.0 g/dL     HCT  91.4 (*)  78.2 - 46.0 %     MCV  96.3  78.0 - 100.0 fL     MCH  33.0  26.0 - 34.0 pg     MCHC  34.3  30.0 - 36.0 g/dL     RDW  95.6  21.3 - 08.6 %     Platelets  191  150 - 400 K/uL    GLUCOSE, CAPILLARY Status: Abnormal    Collection Time    06/06/12 8:06 AM   Component  Value  Range  Comment    Glucose-Capillary  104 (*)  70 - 99 mg/dL     Comment 1  Documented in Chart      Comment 2  Notify RN     GLUCOSE, CAPILLARY Status: Abnormal    Collection Time    06/06/12 11:23 AM   Component  Value  Range  Comment    Glucose-Capillary  149 (*)  70 - 99 mg/dL     Comment 1  Documented in Chart      Comment 2  Notify RN      No results found.  Post Admission Physician Evaluation:  1. Functional deficits secondary to deconditioning from multiple medical. 2. Patient is admitted to receive collaborative, interdisciplinary care between the physiatrist, rehab nursing staff, and therapy team. 3. Patient's level of medical complexity and substantial therapy needs in context of that medical necessity cannot be provided at a lesser intensity of care such as a SNF. 4. Patient has  experienced substantial functional loss from his/her baseline which was documented above under the "Functional History" and "Functional Status" headings. Judging by the patient's diagnosis, physical exam, and functional history, the patient has potential for functional progress which will result in measurable gains while on inpatient rehab. These gains will be of substantial and practical use upon discharge in facilitating mobility and self-care at the household level. 5. Physiatrist will provide 24 hour management of medical needs as well as oversight of the therapy plan/treatment and provide guidance as appropriate regarding the interaction of the two. 6. 24 hour rehab nursing will assist with bladder management, bowel management, safety, skin/wound care, disease management, medication administration, pain management and patient education and help integrate therapy concepts, techniques,education, etc. 7. PT will assess and treat for: fxnl mobility, lower ext strength, safety, adaptive equipment, family ed. Goals are: supervision. 8. OT will assess and treat for: UES, fxnl mobility, ADL's, safety, adaptive equipment, family ed. Goals are: supervision to mod I. 9. SLP will assess and treat for: n/a. Goals are: n/a. 10. Case Management and Social Worker will assess and treat for psychological issues and discharge planning. 11. Team conference will be held weekly to assess progress toward goals and  to determine barriers to discharge. 12. Patient will receive at least 3 hours of therapy per day at least 5 days per week. 13. ELOS: 2 weeks Prognosis: excellent Medical Problem List and Plan:  1. DVT Prophylaxis/Anticoagulation: Pharmaceutical: Pradexa  2. Pain Management: N/A. Continue neurontin for diabetic neuropathy.  3. Mood: alert and appropriate. Will monitor for now.  4. Neuropsych: This patient is capable of making decisions on his/her own behalf.  5. Acute on chronic CHF: daily weights. Low salt  diet. Monitor for signs of fluid overload.  6. Atrial Fibrillation: Monitor HR with bid checks. Continue amiodarone, cardizem and pradaxa. Monitor H/H closely.  7. Hypothyroid: On supplement.  8. E. Coli UTI: continue rocephin Day #2/7  9. DM type 2 with peripheral neuropathy: monitor BS with AC/HS cbg checks. Glucotrol on hold currently. Monitor po intake and will resume if BS on upward trend.  10. Diarrhea: Continue probiotic. C-diff negative. Treat skin irritation  11. ABLA: H/H has varied from 10.5-12.4 range. Will monitor especially with pradaxa resumed. Monitor for bloody diarrhea. Will recheck in am and again next Monday.  12. Insomnia: D/C ambien. Family to provide home RX sonata which is very effective  Ivory Broad, MD

## 2012-06-06 NOTE — Discharge Summary (Signed)
Physician Discharge Summary  [name] VWU:[id number] DOB: Nov 20, 1923 DOA: 05/29/2012  PCP: Ginette Otto, MD  Admit date: 05/29/2012 Discharge date: 06/06/2012  Recommendations for Outpatient Follow-up:  1. Follow up CBC on Monday- Hgb- started on pradaxa and need to be monitored for GI bleed 2. Hold cardizem for SBP < 100 3. Rocephin for 5 more days  Discharge Diagnoses:  Principal Problem:  *E. coli UTI Active Problems:  Chest pain  Atrial fibrillation with RVR  Diastolic heart failure, NYHA class 1  Hypothyroidism  Hypokalemia  Hyponatremia  Sepsis  Ischemic colitis  Bloody diarrhea  Acute respiratory failure with hypoxia  Hypotension  OSA (obstructive sleep apnea)  Pulmonary edema  Leukocytosis   Discharge Condition: improved  Diet recommendation: cardiac  Wt Readings from Last 3 Encounters:  06/06/12 86.183 kg (190 lb)    History of present illness:  Patient is an 78 year old white female past medical history of systolic heart failure as well as atrial fibrillation and diabetes who woke up this morning complaining of midepigastric pain. She described it as feeling very much like indigestion and felt better when she burped. Her symptoms persisted so she came into the emergency room to be evaluated. In the emergency room, she was noted to have a mild leukocytosis and a urinary tract infection. She was also noted to have an elevated BNP of 1700. Patient was given Cipro for her UTI and hospitals were called for admission. By the time she left the emergency room, her pain was much better. Some nitroglycerin truly helped her pain. EKG noted chronic atrial fibrillation and cardiac markers were negative.   Hospital Course:  78 year old female patient with multiple chronic medical problems including atrial fibrillation on Pradaxa. She presented to the hospital on 05/29/2012 with epigastric abdominal pain and was subsequently admitted for treatment of a urinary  tract infection. Prior to admission she was taking antibiotics at home for persistent UTI. At time of admission she was started on ciprofloxacin. Unfortunately her white blood cell count kept increasing and she subsequently developed bloody diarrhea, hypotension and atrial fibrillation with RVR. She had limited response to fluids and was transferred to the ICU to initiate Neo-Synephrine drip. Heart rate improved after she was transitioned to an amiodarone infusion. Patient subsequently stabilized. Her bloody diarrhea stopped with restoration of perfusion and normalization of blood pressure. It was felt she had ischemic colitis related to her hypotensive episode. CT of the abdomen and pelvis demonstrated bowel wall thickening of the right colon rectum and distal ileum as well as mesenteric edema. Her urine culture subsequently grew out Escherichia coli that was resistant to Cipro. Her antibiotics were adjusted to rocephin   Quinolone resistant E. coli UTI  *Initially started on Cipro later found to have culture with resistance pattern and subsequently has significantly improved with transition to IV Primaxin and now rocpephin *Apparently has history of recurrent UTI so consider renal ultrasound versus outpatient urology evaluation   Atrial fibrillation with RVR  *Remains in atrial fibrillation with mostly controlled ventricular response  *Has transition from amiodarone infusion to oral amiodarone  *Was on Pradaxa prior to admission and this medication has been held due to lower GI bleeding secondary to ischemic colitis.  *Began trial of Pradaxa 06/05/2012 to see if bleeding recurs - no further episodes- had normal BM today *If recurrent bleeding would consult gastroenterology   Sepsis/Hypotension/profound Leukocytosis  *Presented with UTI and before cultures were finalized developed sepsis with shock and was later found to have a Cipro  resistant organism  *Did require Neo-Synephrine drip but with  adequate treatment of UTI and restoration of fluid and blood volume hypotension has resolved and leukocytosis has plateaued after a peak of greater than 39,000  *Did not have any thrombocytopenia   Acute respiratory failure with hypoxia secondary to Diastolic heart failure, NYHA class 1/ Pulmonary edema  *Developed initially after aggressive fluid challenges while hypotensive and current chest x-ray shows persistent edematous changes greater in the right lung.  *BNP at presentation 1621 and after hydration increased slightly to 2404  *Appreciate cardiology assistance  *2-D echocardiogram reveals changes consistent with diastolic dysfunction and maybe some early right heart failure  *Please note the cardiologist has reviewed the echocardiogram and notes that parameters listed for aortic stenosis are consistent with moderate stenosis and not severe.   Bloody diarrhea secondary to Ischemic colitis  *So far no further diarrhea with blood  *If bleeding recurs as noted above we'll consult GI   Chest pain  *Resolved  *EKG negative for ischemic changes and cardiac panel negative troponins x4 collections   Hypothyroidism  *Continue Synthroid   Hypokalemia/Hyponatremia  *Related to gastrointestinal losses and dehydration  *Hyponatremia has slightly improved with attempts at diuresis therefore hyponatremic at this point may be dilutional   OSA (obstructive sleep apnea)  *Continue nocturnal CPAP    Consultations:  Cardiology  PM&R  Discharge Exam: Filed Vitals:   06/06/12 0500  BP: 105/70  Pulse: 105  Temp: 98.4 F (36.9 C)  Resp:    Filed Vitals:   06/05/12 1439 06/05/12 2100 06/05/12 2312 06/06/12 0500  BP: 107/63 110/89 116/64 105/70  Pulse: 89 89  105  Temp: 99 F (37.2 C) 98.6 F (37 C)  98.4 F (36.9 C)  TempSrc: Oral Oral  Oral  Resp: 24     Height:      Weight:    86.183 kg (190 lb)  SpO2: 98% 95%  99%    General: A+Ox3, pleasant and  cooperative Cardiovascular: irr Respiratory: dec BS B/L, no wheezing SKin: no rashes or lesions   Discharge Instructions   Medication List  As of 06/06/2012 11:41 AM   ASK your doctor about these medications         allopurinol 300 MG tablet   Commonly known as: ZYLOPRIM   Take 300 mg by mouth daily.      aspirin EC 81 MG tablet   Take 81 mg by mouth daily.      atorvastatin 10 MG tablet   Commonly known as: LIPITOR   Take 10 mg by mouth daily.      Calcium + D3 600-200 MG-UNIT Tabs   Take 1 tablet by mouth daily.      dabigatran 75 MG Caps   Commonly known as: PRADAXA   Take 75 mg by mouth every 12 (twelve) hours.      diltiazem 240 MG 24 hr capsule   Commonly known as: TIAZAC   Take 240 mg by mouth daily.      Fish Oil 1000 MG Caps   Take 1 capsule by mouth daily.      furosemide 80 MG tablet   Commonly known as: LASIX   Take 80 mg by mouth daily.      gabapentin 300 MG capsule   Commonly known as: NEURONTIN   Take 300 mg by mouth at bedtime.      GARLIQUE 400 MG Tbec   Generic drug: Garlic   Take 1 tablet by mouth daily.  glipiZIDE 5 MG 24 hr tablet   Commonly known as: GLUCOTROL XL   Take 5 mg by mouth daily.      levofloxacin 500 MG tablet   Commonly known as: LEVAQUIN   Take 500 mg by mouth daily. Started 05/17/12      levothyroxine 88 MCG tablet   Commonly known as: SYNTHROID, LEVOTHROID   Take 88 mcg by mouth daily.      multivitamin with minerals tablet   Take 1 tablet by mouth daily.      omeprazole 20 MG capsule   Commonly known as: PRILOSEC   Take 20 mg by mouth daily.      potassium chloride 8 MEQ tablet   Commonly known as: KLOR-CON   Take 8 mEq by mouth 2 (two) times daily. For 7 days. Started 05/28/12      PRESCRIPTION MEDICATION   Take 1 tablet by mouth 2 (two) times daily. Methylfolate/Calcium      PROBIOTIC DAILY PO   Take 1 tablet by mouth daily.      vitamin C 500 MG tablet   Commonly known as: ASCORBIC ACID   Take  500 mg by mouth daily.      zaleplon 5 MG capsule   Commonly known as: SONATA   Take 5 mg by mouth at bedtime.           Follow-up Information    Follow up with Ginette Otto, MD in 2 weeks.   Contact information:   [address] [place] [address] [place] [address] [phone]           The results of significant diagnostics from this hospitalization (including imaging, microbiology, ancillary and laboratory) are listed below for reference.    Significant Diagnostic Studies: Ct Abdomen Pelvis Wo Contrast  05/31/2012  *RADIOLOGY REPORT*  Clinical Data: Right lower quadrant pain and status post fall. Evaluate for hematoma.  CT ABDOMEN AND PELVIS WITHOUT CONTRAST  Technique:  Multidetector CT imaging of the abdomen and pelvis was performed following the standard protocol without intravenous contrast.  Comparison: 05/21/2012  Findings: There are small pleural effusions, right side greater than left.  There appears to be a stable 3 mm nodule in the right middle lobe on the first image.  The there are extensive mitral annular calcifications.  There is no evidence of free air.  The patient has developed a small amount of perihepatic ascites which is low density and not suggestive for hemorrhage.  Evidence for calcified gallstones.  No gross abnormality to the spleen or pancreas.  Mild fullness of the adrenal glands without gross abnormality.  There is stable fullness of the right renal pelvis. No gross abnormality to the kidneys.  There is new wall thickening of the ascending colon and increased wall thickening of the terminal and distal ileum.  There is contrast within the right colon and no evidence for obstruction.  There is a new circumferential wall thickening involving the rectum and distal sigmoid colon.  There is new edema and fluid within the pelvis and presacral region. Calcification just posterior to the right renal pelvis appears to be outside the collecting  system.  The infrarenal abdominal images of 2.8 cm with circumferential calcifications.  Again noted is prominent left ovarian tissue measuring up to 3.7 cm and grossly similar to the previous examination.  There is a complex right inguinal hernia which is unchanged.  No acute bony abnormality.  IMPRESSION:  New areas of bowel wall thickening involving the right  colon, rectum and distal ileum.  Findings could represent an infectious or inflammatory process but the distribution also raises concern for ischemia.  Patient has developed new mesenteric edema and small amount of abdominal and pelvic fluid.  Small pleural effusions.  Stable 3 mm nodule in the right middle lobe which may be calcified.  No evidence for a hematoma formation or acute bleed.  Gallstones.  These results will be called to the ordering clinician or representative by the Radiologist Assistant, and communication documented in the PACS Dashboard.  Original Report Authenticated By: Richarda Overlie, M.D.   Ct Abdomen Pelvis W Contrast  05/21/2012  *RADIOLOGY REPORT*  Clinical Data: Right lower abdominal pain for 1 week with diarrhea. History of cholecystectomy and bladder tacking.  No history of malignancy.  CT ABDOMEN AND PELVIS WITH CONTRAST  Technique:  Multidetector CT imaging of the abdomen and pelvis was performed following the standard protocol during bolus administration of intravenous contrast.  Contrast: OMNIPAQUE IOHEXOL 300 MG/ML  SOLN  Comparison: Abdominal ultrasound 05/27/2005.  Findings: Images through the lung bases demonstrate mild dependent atelectasis bilaterally.  There are mitral annular calcifications as well as calcifications of the aortic valve.  There are diffuse coronary artery calcifications.  The liver, spleen and adrenal glands appear unremarkable.  The pancreas is atrophied.  The gallbladder is contracted and shows evidence of gallstones.  In addition, there may be calcification within the wall of the gallbladder  (versus hyperemia).  There is no biliary dilatation.  Mild renal cortical thinning is present on the left.  There is no hydronephrosis or focal cortical abnormality.  There is extensive atherosclerosis of the abdominal aorta and proximal visceral arteries.  No large vessel occlusion is seen. The inferior mesenteric artery appears patent.  The distal abdominal aorta is mildly dilated to 2.7 cm and demonstrates eccentric thrombus.  The stomach and proximal small bowel are normal in caliber.  There are moderately dilated loops of mid to distal small bowel.  The terminal ileum is relatively decompressed and demonstrates a "small bowel feces sign."  The distal small bowel shows some wall thickening and surrounding mesenteric soft tissue edema.  There is no focal transition point, pneumatosis or extraluminal fluid collection.  The colon is decompressed. There are mild sigmoid colon diverticular changes.  There is a right suprapubic hernia containing fat.  There is soft tissue stranding throughout the fat in this hernia.  There is no herniated small bowel.  The uterus is surgically absent.  A 3.7 x 2.3 cm left adnexal soft tissue nodule on image 71 likely represents the patient's left ovary.  The bladder appears normal. There are mild facet degenerative changes of the lower lumbar spine.  IMPRESSION:  1.  Abnormal study demonstrates distal small bowel distension with mild wall thickening and surrounding inflammatory change.  Findings are suggestive of possible mesenteric ischemia with resulting partial small bowel obstruction.  The presence of a "small bowel feces sign" implies some chronicity to the findings. 2.  No evidence of bowel perforation or pneumatosis. 3.  Extensive atherosclerosis. 4.  Right suprapubic hernia containing mildly inflamed fat.  No herniated bowel. 5.  Cholelithiasis with possible gallbladder wall calcification.  These results were called by telephone on 05/21/2012 at 1625 hours to Dr. Pete Glatter,  who verbally acknowledged these results.  Original Report Authenticated By: Gerrianne Scale, M.D.   Dg Chest Port 1 View  06/03/2012  *RADIOLOGY REPORT*  Clinical Data: Atelectasis.  Short of breath.  PORTABLE CHEST - 1  VIEW  Comparison: 06/02/2012.  Findings: Cardiopericardial silhouette partially obscured.  Mild enlargement of the cardiopericardial silhouette.  Dense mitral annular calcification.  Aortic arch calcification.  Aeration appears worse compared to prior exam compatible with worsening pulmonary edema.  Bilateral effusions and atelectasis are also present.  IMPRESSION: Worsening pulmonary aeration compatible with increasing CHF. Basilar pulmonary edema has developed.  Original Report Authenticated By: Andreas Newport, M.D.   Dg Chest Port 1 View  06/02/2012  *RADIOLOGY REPORT*  Clinical Data: Evaluate for pulmonary edema.  PORTABLE CHEST - 1 VIEW  Comparison: Chest x-ray 05/29/2012.  Findings: There are extensive bibasilar opacities (left greater than right) which may represent areas of atelectasis and/or consolidation, likely with superimposed small bilateral pleural effusions (left greater than right).  Minimal pulmonary venous engorgement without frank pulmonary edema.  Mild cardiomegaly is unchanged.  Extensive calcifications of the mitral annulus. Atherosclerosis in the thoracic aorta.  Upper mediastinal contours are within normal limits.  IMPRESSION: 1.  Mild cardiomegaly with mild pulmonary venous congestion, but no frank pulmonary edema. 2.  Extensive bibasilar opacities (left greater than right), compatible with areas of atelectasis and/or consolidation, with superimposed bilateral pleural effusions (left greater than right). 3.  Atherosclerosis.  Original Report Authenticated By: Florencia Reasons, M.D.   Dg Chest Portable 1 View  05/29/2012  *RADIOLOGY REPORT*  Clinical Data:  Chest pain  PORTABLE CHEST - 1 VIEW  Comparison: Most recent prior chest x-ray 12/16/2010, CT scan of the  abdomen and pelvis including the lung bases 05/21/2012  Findings: Persistent, but improved cardiomegaly compared to the prior chest x-ray.  Atherosclerotic calcification of the transverse aorta again noted.  Diffuse calcification of the mitral valve annulus.  The lungs are negative for pulmonary edema, or focal airspace consolidation.  Streaky left retrocardiac opacities are favored to reflect atelectasis.  No pulmonary nodule.  No acute osseous finding.  IMPRESSION:  1.  No acute cardiopulmonary disease. 2.  Persistent, but improved cardiomegaly compared to 12/16/2010 3.  Prominent calcification of the mitral valve annulus 4.  Atherosclerosis  Original Report Authenticated By: Alvino Blood Abd 2 Views  05/29/2012  *RADIOLOGY REPORT*  Clinical Data: Lower abdominal pain with nausea and vomiting. Partial small bowel obstruction.  ABDOMEN - 2 VIEW  Comparison: CT 05/21/2012.  Findings: Upright and 2 supine views.  Upright view demonstrates mild S-shaped thoracolumbar spine curvature.  No significant air fluid levels or free intraperitoneal air.  The pelvis is excluded from the upright view.  The supine views demonstrate no residual small bowel dilatation. Moderate stool within the colon.  Low pelvis is also excluded from the supine views.  No pneumatosis.  Aortic atherosclerosis.  Mitral annular calcifications incidentally noted.  IMPRESSION: Resolved small bowel obstruction.  No evidence of free intraperitoneal air, pneumatosis, or other acute complication.  Possible constipation.  Exclusion of the pelvis from both the upright and supine images.  Original Report Authenticated By: Consuello Bossier, M.D.   Dg Abd Portable 2v  05/31/2012  *RADIOLOGY REPORT*  Clinical Data: Concern is for perforation.  Evaluate for free air. The patient is hypotensive, with elevated white count.  PORTABLE ABDOMEN - 2 VIEW  Comparison: 05/29/2012  Findings: The heart is enlarged.  There is increased left lower lobe infiltrate since prior  study.  Possible small left pleural effusion.  Bowel gas pattern is nonobstructive.  Degenerative changes are seen in the spine.  No evidence for free intraperitoneal air.  IMPRESSION:  1.  Increased left lower lobe infiltrate  or atelectasis and left pleural effusion. 2.  Nonobstructive bowel gas pattern. 3.  No evidence for free intraperitoneal air.  Original Report Authenticated By: Patterson Hammersmith, M.D.    Microbiology: Recent Results (from the past 240 hour(s))  URINE CULTURE     Status: Normal   Collection Time   05/29/12  2:08 PM      Component Value Range Status Comment   Specimen Description URINE, CLEAN CATCH   Final    Special Requests ADDED 05/29/12 1729   Final    Culture  Setup Time 05/29/2012 17:52   Final    Colony Count >=100,000 COLONIES/ML   Final    Culture ESCHERICHIA COLI   Final    Report Status 05/31/2012 FINAL   Final    Organism ID, Bacteria ESCHERICHIA COLI   Final   MRSA PCR SCREENING     Status: Normal   Collection Time   05/30/12 10:36 PM      Component Value Range Status Comment   MRSA by PCR NEGATIVE  NEGATIVE Final   CULTURE, BLOOD (ROUTINE X 2)     Status: Normal   Collection Time   05/31/12 12:29 AM      Component Value Range Status Comment   Specimen Description BLOOD LEFT ARM   Final    Special Requests BOTTLES DRAWN AEROBIC AND ANAEROBIC 10CC EACH   Final    Culture  Setup Time 05/31/2012 09:00   Final    Culture NO GROWTH 5 DAYS   Final    Report Status 06/06/2012 FINAL   Final   CULTURE, BLOOD (ROUTINE X 2)     Status: Normal   Collection Time   05/31/12 12:29 AM      Component Value Range Status Comment   Specimen Description BLOOD LEFT HAND   Final    Special Requests BOTTLES DRAWN AEROBIC AND ANAEROBIC 5CC EACH   Final    Culture  Setup Time 05/31/2012 09:00   Final    Culture NO GROWTH 5 DAYS   Final    Report Status 06/06/2012 FINAL   Final   CLOSTRIDIUM DIFFICILE BY PCR     Status: Normal   Collection Time   05/31/12  8:36 AM       Component Value Range Status Comment   C difficile by pcr NEGATIVE  NEGATIVE Final      Labs: Basic Metabolic Panel:  Lab 06/06/12 1610 06/04/12 0445 06/03/12 0545 06/02/12 0515 06/01/12 0525 05/31/12 0740 05/30/12 2134  NA 136 129* 127* 133* 127* -- --  K 4.5 4.3 5.0 5.0 3.3* -- --  CL 101 97 99 104 97 -- --  CO2 29 19 17* 23 22 -- --  GLUCOSE 92 75 114* 131* 115* -- --  BUN 11 9 11 11 16  -- --  CREATININE 0.85 0.69 0.79 0.77 0.86 -- --  CALCIUM 7.9* 7.9* 7.8* 7.9* 7.4* -- --  MG -- -- -- 1.8 1.9 1.3* 1.5  PHOS -- -- -- 2.1* 2.6 -- --   Liver Function Tests:  Lab 05/31/12 0227  AST 21  ALT 15  ALKPHOS 75  BILITOT 0.3  PROT 3.8*  ALBUMIN 1.5*    Lab 05/31/12 0740  LIPASE 8*  AMYLASE 62   No results found for this basename: AMMONIA:5 in the last 168 hours CBC:  Lab 06/06/12 0520 06/04/12 0445 06/03/12 0545 06/02/12 0515 06/01/12 0525  WBC 9.6 11.7* 10.9* 13.4* 23.8*  NEUTROABS -- -- -- -- --  HGB 10.7* 12.4 11.9* 11.5* 10.5*  HCT 31.2* 36.1 34.6* 33.2* 31.1*  MCV 96.3 96.8 94.8 96.2 94.8  PLT 191 198 181 225 219   Cardiac Enzymes:  Lab 05/30/12 2134  CKTOTAL 62  CKMB 2.5  CKMBINDEX --  TROPONINI <0.30   BNP: BNP (last 3 results)  Basename 05/31/12 0227 05/29/12 1711  PROBNP 2404.0* 1621.0*   CBG:  Lab 06/06/12 1123 06/06/12 0806 06/05/12 2103 06/05/12 1643 06/05/12 1157  GLUCAP 149* 104* 132* 94 138*    Time coordinating discharge: 40 minutes  Signed:  Benjamine Mola, Najiyah Paris  [facility] 06/06/2012, 11:41 AM

## 2012-06-06 NOTE — Plan of Care (Signed)
Overall Plan of Care [facility]) Patient Details Name: [name] MRN: [id number] DOB: [birth year]  Diagnosis:deconditioning after urosepsis, peripheral neuropathy    Primary Diagnosis:    Physical deconditioning Co-morbidities: CHF, HTN, afib, peripheral neuropathy, DM  Functional Problem List  Patient demonstrates impairments in the following areas: Balance, Bladder, Bowel, Cognition, Endurance, Medication Management, Motor, Nutrition, Pain, Safety, Sensory  and Skin Integrity  Basic ADL's: bathing, dressing and toileting Advanced ADL's: not applicable  Transfers:  bed mobility, bed to chair, toilet, tub/shower and car Locomotion:  ambulation, wheelchair mobility and stairs  Additional Impairments:  None  Anticipated Outcomes Item Anticipated Outcome  Eating/Swallowing  independent  Basic self-care  supervision  Tolieting  supervision  Bowel/Bladder  Continent with toileting modified independent  Transfers  supervision  Locomotion  supervision  Communication    Cognition    Pain  3 or less on scale of 1-10  Safety/Judgment  supervision  Other     Therapy Plan: PT Frequency: 1-2 X/day, 60-90 minutes;5 out of 7 days OT Frequency: 1-2 X/day, 60-90 minutes;5 out of 7 days     Team Interventions: Item RN PT OT SLP SW TR Other  Self Care/Advanced ADL Retraining   x      Neuromuscular Re-Education  x       Therapeutic Activities  x x      UE/LE Strength Training/ROM  x x      UE/LE Coordination Activities  x       Visual/Perceptual Remediation/Compensation         DME/Adaptive Equipment Instruction  x x      Therapeutic Exercise  x x      Balance/Vestibular Training  x x      Patient/Family Education x x x      Cognitive Remediation/Compensation  x x      Functional Mobility Training  x x      Ambulation/Gait Training  x       Museum/gallery curator  x       Wheelchair Propulsion/Positioning  x       Patent attorney         Bladder Management x        Bowel Management x        Disease Management/Prevention x        Pain Management x        Medication Management x        Skin Care/Wound Management x        Splinting/Orthotics         Discharge Planning x x x      Psychosocial Support x                           Team Discharge Planning: Destination:  Home Projected Follow-up:  PT, OT and Home Health Projected Equipment Needs:  Wheelchair Patient/family involved in discharge planning: Patient only; No family available  MD ELOS: 2weeks Medical Rehab Prognosis:  Excellent Assessment: Pt admitted for CIR therapies. The team will be addressing stamina, endurance, balance, fxnl mobility, adaptive equipment, ADl's, caregiver ed, strength. Goals are set supervision to modified independent

## 2012-06-07 ENCOUNTER — Inpatient Hospital Stay (HOSPITAL_COMMUNITY): Payer: Medicare Other | Admitting: Occupational Therapy

## 2012-06-07 ENCOUNTER — Inpatient Hospital Stay (HOSPITAL_COMMUNITY): Payer: Medicare Other | Admitting: Physical Therapy

## 2012-06-07 ENCOUNTER — Inpatient Hospital Stay (HOSPITAL_COMMUNITY): Payer: Medicare Other | Admitting: *Deleted

## 2012-06-07 LAB — GLUCOSE, CAPILLARY: Glucose-Capillary: 121 mg/dL — ABNORMAL HIGH (ref 70–99)

## 2012-06-07 LAB — COMPREHENSIVE METABOLIC PANEL
ALT: 11 U/L (ref 0–35)
AST: 20 U/L (ref 0–37)
CO2: 29 mEq/L (ref 19–32)
Calcium: 8.1 mg/dL — ABNORMAL LOW (ref 8.4–10.5)
Chloride: 99 mEq/L (ref 96–112)
GFR calc non Af Amer: 62 mL/min — ABNORMAL LOW (ref >90)
Sodium: 135 mEq/L (ref 135–145)
Total Bilirubin: 0.3 mg/dL (ref 0.3–1.2)

## 2012-06-07 LAB — CBC WITH DIFFERENTIAL/PLATELET
Basophils Relative: 1 % (ref 0–1)
Eosinophils Relative: 2 % (ref 0–5)
Hemoglobin: 11.1 g/dL — ABNORMAL LOW (ref 12.0–15.0)
Lymphocytes Relative: 29 % (ref 12–46)
Monocytes Relative: 11 % (ref 3–12)
Neutrophils Relative %: 57 % (ref 43–77)
RBC: 3.4 MIL/uL — ABNORMAL LOW (ref 3.87–5.11)

## 2012-06-07 MED ORDER — FLUCONAZOLE 100 MG PO TABS
100.0000 mg | ORAL_TABLET | Freq: Every day | ORAL | Status: AC
  Administered 2012-06-07 – 2012-06-09 (×3): 100 mg via ORAL
  Filled 2012-06-07 (×4): qty 1

## 2012-06-07 MED ORDER — NYSTATIN 100000 UNIT/GM EX POWD
Freq: Two times a day (BID) | CUTANEOUS | Status: DC
  Administered 2012-06-07 – 2012-06-20 (×24): via TOPICAL
  Filled 2012-06-07 (×2): qty 15

## 2012-06-07 NOTE — Progress Notes (Signed)
Occupational Therapy Session Note  Patient Details  Name: [name] MRN: [id number] Date of Birth: January 09, 1924  Today's Date: 06/07/2012 Time: 1430-1545 (85 min) Time Calculation (min): 60 min  Short Term Goals: Week 1:  OT Short Term Goal 1 (Week 1): Pt will complete sit to stand at toilet with min assist. OT Short Term Goal 2 (Week 1): Pt will be able to cleanse self on toilet. OT Short Term Goal 3 (Week 1): Pt will be able to bathe with min assist. OT Short Term Goal 4 (Week 1): Pt will transfer to toilet with mod assist x 1. OT Short Term Goal 5 (Week 1): Pt will transfer to shower seat with mod assist x 1.  Skilled Therapeutic Interventions/Progress Updates:  Balance, transfer training, Cognitive remediation/compensation, and therapeutic activities. Pt. Oriented to time and situation.   Pt. Transferred from supine to sit with minimal assist ant to wc with stand pivot transfer with  Moderate assist.  Transferred to toilet with moderated assist.  Pt was incontinent of bowel.  Pt. Performed self cleaning with OT going over for thoroughness.  Changed clothes with total assist for lower body.  Pt. Stood for 30 second with cues for postural control and upright posture.  Pt. Transferred back to wc with maximum assist.  She was getting tired at this point so practiced transfer back to bed with max assist.    Therapy Documentation Precautions:  Precautions Precautions: Fall Precaution Comments: DNR; maintain Sp02 >90% on 2L02, monitor HR and saturation Restrictions Weight Bearing Restrictions: No Pain: Pain Assessment Pain Assessment: No/denies pain    See FIM for current functional status  Therapy/Group: Individual Therapy  Humberto Seals 06/07/2012, 3:13 PM

## 2012-06-07 NOTE — Progress Notes (Signed)
Patient information reviewed and entered into [facility] system by Dallyn Bergland, RN, CRRN, PPS Coordinator.  Information including medical coding and functional independence measure will be reviewed and updated through discharge.     Per nursing patient was given "Data Collection Information Summary for Patients in Inpatient Rehabilitation Facilities with attached "Privacy Act Statement-Health Care Records" upon admission.   

## 2012-06-07 NOTE — Evaluation (Signed)
Physical Therapy Assessment and Plan  Patient Details  Name: [name] MRN: [id number] Date of Birth: 23-Aug-1924  PT Diagnosis: Difficulty walking, Impaired cognition, Impaired sensation, Muscle weakness and Vertigo Rehab Potential: Good ELOS: 2 weeks   Today's Date: 06/07/2012 Time: 0906-1003 Time Calculation (min): 57 min  Problem List:  Patient Active Problem List  Diagnosis  . Chest pain  . Vomiting  . Constipation  . Atrial fibrillation with RVR  . Diastolic heart failure, NYHA class 1  . Hypothyroidism  . Asthma  . Hypokalemia  . Hyponatremia  . E. coli UTI  . Sepsis  . Ischemic colitis  . C. difficile colitis  . Bloody diarrhea  . Acute respiratory failure with hypoxia  . Hypotension  . OSA (obstructive sleep apnea)  . Pulmonary edema  . Leukocytosis  . Physical deconditioning    Past Medical History:  Past Medical History  Diagnosis Date  . CHF (congestive heart failure)   . Hypertension   . Atrial fibrillation   . Peripheral neuropathy   . Asthma   . High cholesterol   . Type II diabetes mellitus   . PAD (peripheral artery disease)   . Enlarged heart   . Anginal pain 05/28/12  . Pneumonia 11/2010  . Sleep apnea   . Hypothyroidism   . Headache 05/29/12    "recently"  . Stroke 05/2005; 08/2010    !mini"  . Chronic kidney disease     "something on labs always say she has some problems"  . Personal history of gout    Past Surgical History:  Past Surgical History  Procedure Date  . Tonsillectomy and adenoidectomy 1960's  . Dilation and curettage of uterus   . Vaginal hysterectomy 1970's  . Total knee arthroplasty 1970-~2002    left; right  . Cataract extraction w/ intraocular lens  implant, bilateral ?1970's    Assessment & Plan Clinical Impression: Patient is a 78 y.o. female with multiple chronic medical problems including, peripheral neuropathy, atrial fibrillation, and CHF. She presented to the hospital on 05/29/2012 with epigastric  abdominal pain and was subsequently admitted for treatment of a urinary tract infection. Prior to admission she was taking antibiotics at home for persistent UTI. At time of admission she was started on ciprofloxacin. Unfortunately her white blood cell count kept increasing and she subsequently developed bloody diarrhea, hypotension and atrial fibrillation with RVR. She had limited response to fluids and was transferred to the ICU to initiate Neo-Synephrine drip. Heart rate improved after she was transitioned to an amiodarone infusion. Patient subsequently stabilized. Her bloody diarrhea stopped with restoration of perfusion and normalization of blood pressure. It was felt she had ischemic colitis related to her hypotensive episode. CT of the abdomen and pelvis demonstrated bowel wall thickening of the right colon rectum and distal ileum as well as mesenteric edema. Her urine culture subsequently grew out Escherichia coli that was resistant to Cipro. Her antibiotics were adjusted to Primaxin and she was stable enough to transfer to the step down unit. Dr. Mayford Knife following for input on CHF and A Fib. Acute on chronic CHF compensated and no need for diuretics. Atrial fibrillation controlled on po amiodarone and Cardizem. Pradaxa resumed yesterday. Antibiotics changed to rocephin with recommendations to continue for five additional days. Patient transferred to CIR on 06/06/2012 .   Patient currently requires total with mobility secondary to muscle weakness, decreased cardiorespiratoy endurance and decreased oxygen support, decreased initiation, decreased awareness, decreased problem solving and impaired sequencing, dizziness with transitional  movements and decreased standing balance and decreased balance strategies.  Prior to hospitalization, patient was mod I with mobility with [phone] and lived with Alone in a House home but had 24/7 supervision and home making assistance from hired caregivers ([facility] [place]).  Home  access is 4 or 5Stairs to enterwith R rail.  Patient will benefit from skilled PT intervention to maximize safe functional mobility, minimize fall risk and decrease caregiver burden for planned discharge home with 24 hour supervision.  Anticipate patient will benefit from follow up HH at discharge.  PT - End of Session Activity Tolerance: Decreased this session;Tolerates 10 - 20 min activity with multiple rests Endurance Deficit: Yes Endurance Deficit Description: SOB, LE weakness and fatigue PT Assessment Rehab Potential: Good Barriers to Discharge: None PT Plan PT Frequency: 1-2 X/day, 60-90 minutes;5 out of 7 days Estimated Length of Stay: 2 weeks PT Treatment/Interventions: Ambulation/gait training;Balance/vestibular training;Cognitive remediation/compensation;Discharge planning;DME/adaptive equipment instruction;Functional mobility training;Neuromuscular re-education;Patient/family education;Stair training;Therapeutic Activities;Therapeutic Exercise;UE/LE Strength taining/ROM;UE/LE Coordination activities;Wheelchair propulsion/positioning PT Recommendation Recommendations for Other Services: Speech consult Follow Up Recommendations: Home health PT;24 hour supervision/assistance Equipment Recommended: Wheelchair (measurements);Wheelchair cushion (measurements) Equipment Details: W/C for longer community distances-TBD  PT Evaluation Precautions/Restrictions Precautions Precautions: Fall Precaution Comments: DNR; maintain Sp02 >90% on 2L02, monitor HR and saturation Vital Signs Therapy Vitals Pulse Rate: 78  Oxygen Therapy SpO2: 95 % O2 Device: None (Room air) Pain Pain Assessment Pain Assessment: No/denies pain Home Living/Prior Functioning Home Living Lives With: Alone Available Help at Discharge: Personal care attendant;Available 24 hours/day Type of Home: House Home Access: Stairs to enter [employer] of Steps: 4 or 5 Entrance Stairs-Rails: Right Home  Layout: One level Home Adaptive Equipment: Walker - four wheeled Prior Function Level of Independence: Independent with transfers;Requires assistive device for independence;Independent with gait;Needs assistance with homemaking Bath: Supervision/set-up Meal Prep: Total Light Housekeeping: Total Able to Take Stairs?: Yes Driving: No (daughter would drive her) Comments: Would walk 1/2 mile in her neighborhood with [phone] each day Cognition Arousal/Alertness: Awake/alert Orientation Level: Oriented to person Awareness: Impaired Problem Solving: Impaired Problem Solving Impairment: Functional basic Executive Function: Sequencing Sequencing: Impaired Sequencing Impairment: Environmental health practitioner Light Touch: Impaired by gross assessment (h/o peripheral neuropathy) Stereognosis: Not tested Hot/Cold: Not tested Proprioception: Not tested Coordination Gross Motor Movements are Fluid and Coordinated: Not tested Motor  Motor Motor - Skilled Clinical Observations: Significant deconditioning, decreased functional strength bilat LE  Mobility Transfers Sit to Stand: With armrests;1: +1 Total assist Stand to Sit: 1: +1 Total assist;With armrests Stand Pivot Transfers: 1: +2 Total assist Stand Pivot Transfer Details (indicate cue type and reason): Secondary to impaired sequencing and decreased strength, required total A for sit <> stand and stand pivot with UE supported with total verbal cues to initiate and sequence  Patient reports h/o dizziness during transitional movements from supine <> sit and sit <> stand; may benefit from vestibular evaluation  Locomotion  Ambulation Ambulation/Gait Assistance: 2: Max assist Ambulation Distance (Feet): 10 Feet Assistive device: 4-wheeled walker Ambulation/Gait Assistance Details: Required verbal cues and assistance to maintain upright trunk, safe management of 4WW and to maintain safe distance to RW and cues for increased step and  stride length for more efficient gait Gait Gait Pattern: Step-to pattern;Decreased step length - left;Decreased stance time - right;Decreased stride length;Trunk flexed;Wide base of support;Shuffle Stairs / Additional Locomotion Stairs: Yes Stairs Assistance: 2: Max Editor, commissioning Details (indicate cue type and reason): Patient could verbalize that she uses step to pattern but required verbal  cues to ascend leading with stronger LE and required max A to fully advance COG to next step Stair Management Technique: Two rails;Step to pattern;Forwards Number of Stairs: 3  Height of Stairs: 4  Wheelchair Mobility Wheelchair Mobility: Yes Wheelchair Assistance: 3: Mod assist;2: Max Education officer, museum: Both upper extremities Wheelchair Parts Management: Needs assistance Distance: 27' with mod-max A for hand placement and UE propulsion sequence to maintain straight navigation and for changing direction; patient tends to propel with very short strokes.    Trunk/Postural Assessment  Cervical Assessment Cervical Assessment: Within Functional Limits Thoracic Assessment Thoracic Assessment: Within Functional Limits Lumbar Assessment Lumbar Assessment: Within Functional Limits Postural Control Postural Control: Within Functional Limits  Balance Static Sitting Balance Static Sitting - Balance Support: Bilateral upper extremity supported;Feet supported Static Sitting - Level of Assistance: 5: Stand by assistance Static Standing Balance Static Standing - Balance Support: Bilateral upper extremity supported Static Standing - Level of Assistance: 1: +1 Total assist Static Standing - Comment/# of Minutes: Trunk flexed with slight posterior lean Dynamic Standing Balance Dynamic Standing - Balance Support: Bilateral upper extremity supported Dynamic Standing - Level of Assistance: 1: +1 Total assist Extremity Assessment  RLE Assessment RLE Assessment: Exceptions to [facility] RLE  Strength RLE Overall Strength: Deficits RLE Overall Strength Comments: 4/5 except 3/5 hip flexion but very poor muscular endurance during WB and functional task LLE Assessment LLE Assessment: Exceptions to [facility] LLE Strength LLE Overall Strength: Deficits LLE Overall Strength Comments: 4-/5 except 2/5 hip flexion; very poor muscular endurance during WB and functional movements  See FIM for current functional status Refer to Care Plan for Long Term Goals  Recommendations for other services: Other: Speech evaluation and vestibular evaluation  Discharge Criteria: Patient will be discharged from PT if patient refuses treatment 3 consecutive times without medical reason, if treatment goals not met, if there is a change in medical status, if patient makes no progress towards goals or if patient is discharged from hospital.  The above assessment, treatment plan, treatment alternatives and goals were discussed and mutually agreed upon: by patient  Edman Circle Faucette 06/07/2012, 12:01 PM

## 2012-06-07 NOTE — Progress Notes (Signed)
Physical Therapy Note  Patient Details  Name: [name] MRN: [id number] Date of Birth: 27-Aug-1924 Today's Date: 06/07/2012  11:30-12:00 individual therapy pt denied pain  Pt complained of fatigue from previous sessions. Sit to stand at sink mod assist, perforemd standing heel raises, hip abduction and march x 5 each. Pt stated she felt legs were going to give out with attempts to ambulate back to bed from sink. Performed stand pivot transfer with mod assist without walker. Sit to supine max assist.   [name] 06/07/2012, 12:05 PM

## 2012-06-07 NOTE — Progress Notes (Signed)
Patient ID: [name], female   DOB: May 01, 1924, 78 y.o.   MRN: [id number] Subjective/Complaints: Fatigued from first therapy session today. Good night. A 12 point review of systems has been performed and if not noted above is otherwise negative.   Objective: Vital Signs: Blood pressure 111/71, pulse 80, temperature 98.1 F (36.7 C), temperature source Oral, resp. rate 18, height 5\' 7"  (1.702 m), weight 87.4 kg (192 lb 10.9 oz), SpO2 93.00%. No results found.  Basename 06/07/12 0500 06/06/12 0520  WBC 8.9 9.6  HGB 11.1* 10.7*  HCT 32.8* 31.2*  PLT 204 191    Basename 06/07/12 0500 06/06/12 0520  NA 135 136  K 3.9 4.5  CL 99 101  CO2 29 29  GLUCOSE 91 92  BUN 9 11  CREATININE 0.84 0.85  CALCIUM 8.1* 7.9*   CBG (last 3)   Basename 06/07/12 0728 06/06/12 2044 06/06/12 1637  GLUCAP 94 91 134*    Wt Readings from Last 3 Encounters:  06/07/12 87.4 kg (192 lb 10.9 oz)  06/06/12 86.183 kg (190 lb)    Physical Exam:   General: Alert and oriented x 3, No apparent distress HEENT: Head is normocephalic, atraumatic, PERRLA, EOMI, sclera anicteric, oral mucosa pink and moist, dentition intact, ext ear canals clear,  Neck: Supple without JVD or lymphadenopathy Heart: Reg rate and rhythm. No murmurs rubs or gallops Chest: CTA bilaterally without wheezes, rales, or rhonchi; no distress Abdomen: Soft, non-tender, non-distended, bowel sounds positive. Extremities: No clubbing, cyanosis, or edema. Pulses are 2+ Skin: Clean and intact without signs of breakdown Neuro: Pt is cognitively appropriate with normal insight, memory, and awareness. Cranial nerves 2-12 are intact. Sensory exam is normal. Reflexes are 2+ in all 4's. Fine motor coordination is intact. No tremors. Motor function is grossly 5/5.  Musculoskeletal: Full ROM, No pain with AROM or PROM in the neck, trunk, or extremities. Posture appropriate Psych: Pt's affect is appropriate. Pt is  cooperative      Assessment/Plan: 1. Functional deficits secondary to deconditioning after urosepsis and multiple medical complications which require 3+ hours per day of interdisciplinary therapy in a comprehensive inpatient rehab setting. Physiatrist is providing close team supervision and 24 hour management of active medical problems listed below. Physiatrist and rehab team continue to assess barriers to discharge/monitor patient progress toward functional and medical goals. FIM:                   Comprehension Comprehension: 4-Understands basic 75 - 89% of the time/requires cueing 10 - 24% of the time  Expression Expression: 4-Expresses basic 75 - 89% of the time/requires cueing 10 - 24% of the time. Needs helper to occlude trach/needs to repeat words.           Medical Problem List and Plan:  1. DVT Prophylaxis/Anticoagulation: Pharmaceutical: Pradexa  2. Pain Management: N/A. Continue neurontin for diabetic neuropathy.  3. Mood: alert and appropriate. Will monitor for now.  4. Neuropsych: This patient is capable of making decisions on his/her own behalf.  5. Acute on chronic CHF: daily weights. Low salt diet. Monitor for signs of fluid overload.  6. Atrial Fibrillation: Monitor HR with bid checks. Continue amiodarone, cardizem and pradaxa. Monitor H/H closely.  7. Hypothyroid: On supplement.  8. E. Coli UTI: continue rocephin thru 8/13 9. DM type 2 with peripheral neuropathy: monitor BS with AC/HS cbg checks. Glucotrol on hold currently. Monitor po intake and will resume if BS on upward trend.  10. Diarrhea: Continue probiotic.  C-diff negative. Treat skin irritation  11. ABLA: H/H has varied from 10.5-12.4 range. Will monitor especially with pradaxa resumed. Monitor for bloody diarrhea. Will recheck in am and again next Monday.  12. Insomnia: D/C ambien. Family to provide home RX sonata which is very effective  LOS (Days) 1 A FACE TO FACE EVALUATION WAS  PERFORMED  SWARTZ,ZACHARY T 06/07/2012, 8:44 AM

## 2012-06-07 NOTE — Evaluation (Signed)
Occupational Therapy Assessment and Plan  Patient Details  Name: [name] MRN: [id number] Date of Birth: 1924/04/06  OT Diagnosis: apraxia, cognitive deficits and muscle weakness (generalized) Rehab Potential: Rehab Potential: Good ELOS: 2 weeks   Today's Date: 06/07/2012 Time: 0803-0905 Time Calculation (min): 62 min  1:1 Pt seen for initial evaluation and self care retraining with a focus on functional mobility with sit to stand and transfers using squat pivots as pt unable to come into a full stand without total assist.  Pt demonstrated motor planning impairments with transfers.  She was able to tolerate room air for 15 min at 99% O2.   Problem List:  Patient Active Problem List  Diagnosis  . Chest pain  . Vomiting  . Constipation  . Atrial fibrillation with RVR  . Diastolic heart failure, NYHA class 1  . Hypothyroidism  . Asthma  . Hypokalemia  . Hyponatremia  . E. coli UTI  . Sepsis  . Ischemic colitis  . C. difficile colitis  . Bloody diarrhea  . Acute respiratory failure with hypoxia  . Hypotension  . OSA (obstructive sleep apnea)  . Pulmonary edema  . Leukocytosis  . Physical deconditioning    Past Medical History:  Past Medical History  Diagnosis Date  . CHF (congestive heart failure)   . Hypertension   . Atrial fibrillation   . Peripheral neuropathy   . Asthma   . High cholesterol   . Type II diabetes mellitus   . PAD (peripheral artery disease)   . Enlarged heart   . Anginal pain 05/28/12  . Pneumonia 11/2010  . Sleep apnea   . Hypothyroidism   . Headache 05/29/12    "recently"  . Stroke 05/2005; 08/2010    !mini"  . Chronic kidney disease     "something on labs always say she has some problems"  . Personal history of gout    Past Surgical History:  Past Surgical History  Procedure Date  . Tonsillectomy and adenoidectomy 1960's  . Dilation and curettage of uterus   . Vaginal hysterectomy 1970's  . Total knee arthroplasty 1970-~2002     left; right  . Cataract extraction w/ intraocular lens  implant, bilateral ?1970's    Assessment & Plan Clinical Impression: [name] is a 78 y.o. female with multiple chronic medical problems including, peripheral neuropathy, atrial fibrillation, and CHF. She presented to the hospital on 05/29/2012 with epigastric abdominal pain and was subsequently admitted for treatment of a urinary tract infection. Prior to admission she was taking antibiotics at home for persistent UTI. At time of admission she was started on ciprofloxacin. Unfortunately her white blood cell count kept increasing and she subsequently developed bloody diarrhea, hypotension and atrial fibrillation with RVR. She had limited response to fluids and was transferred to the ICU to initiate Neo-Synephrine drip. Heart rate improved after she was transitioned to an amiodarone infusion. Patient subsequently stabilized. Her bloody diarrhea stopped with restoration of perfusion and normalization of blood pressure. It was felt she had ischemic colitis related to her hypotensive episode. CT of the abdomen and pelvis demonstrated bowel wall thickening of the right colon rectum and distal ileum as well as mesenteric edema. Her urine culture subsequently grew out Escherichia coli that was resistant to Cipro. Her antibiotics were adjusted to Primaxin and she was stable enough to transfer to the step down unit. Dr. Mayford Knife following for input on CHF and A Fib. Acute on chronic CHF compensated and no need for diuretics.  Atrial fibrillation controlled on po amiodarone and Cardizem. Pradaxa resumed yesterday. Antibiotics changed to rocephin with recommendations to continue for five additional days.   Patient transferred to CIR on 06/06/2012 .    Patient currently requires total with basic self-care skills secondary to muscle weakness, motor apraxia, decreased problem solving and delayed processing and decreased standing balance.  Prior to  hospitalization, patient could complete basic adls with supervision.  Patient will benefit from skilled intervention to increase independence with basic self-care skills prior to discharge home with care partner.  Anticipate patient will require 24 hour supervision and follow up home health.  OT - End of Session Activity Tolerance: Tolerates 10 - 20 min activity with multiple rests Endurance Deficit: Yes Endurance Deficit Description: SOB, LE weakness and fatigue OT Assessment Rehab Potential: Good Barriers to Discharge: None OT Plan OT Frequency: 1-2 X/day, 60-90 minutes;5 out of 7 days Estimated Length of Stay: 2 weeks OT Treatment/Interventions: Balance/vestibular training;Cognitive remediation/compensation;Discharge planning;DME/adaptive equipment instruction;Functional mobility training;Patient/family education;Self Care/advanced ADL retraining;Therapeutic Activities;Therapeutic Exercise;UE/LE Strength taining/ROM OT Recommendation Recommendations for Other Services: Vestibular eval Follow Up Recommendations: Home health OT Equipment Recommended: Wheelchair (measurements);Wheelchair cushion (measurements)  OT Evaluation Precautions/Restrictions  Precautions Precautions: Fall Precaution Comments: DNR; maintain Sp02 >90% on 2L02, monitor HR and saturation   Vital Signs Therapy Vitals Pulse Rate: 78  Oxygen Therapy SpO2: 95 % O2 Device: None (Room air) Pain Pain Assessment Pain Assessment: No/denies pain Home Living/Prior Functioning Home Living Lives With: Alone Available Help at Discharge: Personal care attendant;Available 24 hours/day Type of Home: House Home Access: Stairs to enter [employer] of Steps: 4 or 5 Entrance Stairs-Rails: Right Home Layout: One level Bathroom Shower/Tub: Health visitor: Handicapped height Bathroom Accessibility: Yes Home Adaptive Equipment: Walker - four wheeled;Grab bars in shower Prior Function Level of  Independence: Needs assistance with ADLs (supervision with adls) Bath: Supervision/set-up Meal Prep: Total Light Housekeeping: Total Able to Take Stairs?: Yes Driving: No (daughter would drive her) Comments: Would walk 1/2 mile in her neighborhood with [phone] each day ADL Refer to FIM   Vision/Perception  Vision - History Baseline Vision: Wears glasses all the time Patient Visual Report: No change from baseline Vision - Assessment Vision Assessment: Vision not tested Additional Comments: To be assessed with vestibular eval Perception Perception: Within Functional Limits Praxis Praxis: Impaired Praxis Impairment Details: Motor planning Praxis-Other Comments: impaired with mobility skills  Cognition Overall Cognitive Status: Impaired Arousal/Alertness: Awake/alert Orientation Level: Oriented X4 Memory: Impaired Memory Impairment: Decreased recall of new information Awareness: Impaired Problem Solving: Impaired Problem Solving Impairment: Functional basic Executive Function: Sequencing Sequencing: Impaired Sequencing Impairment: Functional basic Safety/Judgment: Appears intact Sensation Sensation Light Touch: Impaired by gross assessment (h/o peripheral neuropathy) Stereognosis: Not tested Hot/Cold: Not tested Proprioception: Not tested Coordination Gross Motor Movements are Fluid and Coordinated: Not tested Fine Motor Movements are Fluid and Coordinated: Yes Motor  Motor Motor - Skilled Clinical Observations: Significant deconditioning, decreased functional strength bilat LE Mobility  Transfers Sit to Stand: With armrests;1: +1 Total assist Stand to Sit: 1: +1 Total assist;With armrests  Trunk/Postural Assessment  Cervical Assessment Cervical Assessment: Within Functional Limits Thoracic Assessment Thoracic Assessment: Within Functional Limits Lumbar Assessment Lumbar Assessment: Within Functional Limits Postural Control Postural Control: Within Functional  Limits  Balance Balance Balance Assessed: Yes Static Sitting Balance Static Sitting - Balance Support: Bilateral upper extremity supported;Feet supported Static Sitting - Level of Assistance: 5: Stand by assistance Dynamic Sitting Balance Dynamic Sitting - Balance Support: Feet supported Dynamic Sitting -  Level of Assistance: 4: Min Oncologist Standing - Balance Support: Bilateral upper extremity supported Static Standing - Level of Assistance: 1: +1 Total assist Static Standing - Comment/# of Minutes: Trunk flexed with slight posterior lean Dynamic Standing Balance Dynamic Standing - Balance Support: Bilateral upper extremity supported Dynamic Standing - Level of Assistance: 1: +1 Total assist Extremity/Trunk Assessment RUE Assessment RUE Assessment: Within Functional Limits LUE Assessment LUE Assessment: Within Functional Limits  See FIM for current functional status Refer to Care Plan for Long Term Goals  Recommendations for other services: Other: vestibular eval  Discharge Criteria: Patient will be discharged from OT if patient refuses treatment 3 consecutive times without medical reason, if treatment goals not met, if there is a change in medical status, if patient makes no progress towards goals or if patient is discharged from hospital.  The above assessment, treatment plan, treatment alternatives and goals were discussed and mutually agreed upon: by patient  [facility] 06/07/2012, 12:39 PM

## 2012-06-07 NOTE — Care Management Note (Signed)
[facility] Individual Statement of Services  Patient Name:  [name]  Date:  06/07/2012  Welcome to the [facility].  Our goal is to provide you with an individualized program based on your diagnosis and situation, designed to meet your specific needs.  With this comprehensive rehabilitation program, you will be expected to participate in at least 3 hours of rehabilitation therapies Monday-Friday, with modified therapy programming on the weekends.  Your rehabilitation program will include the following services:  Physical Therapy (PT), Occupational Therapy (OT), 24 hour per day rehabilitation nursing, Therapeutic Recreaction (TR), Case Management (RN and Child psychotherapist), Rehabilitation Medicine, Nutrition Services and Pharmacy Services  Weekly team conferences will be held on Wednesday to discuss your progress.  Your RN Case Designer, television/film set will talk with you frequently to get your input and to update you on team discussions.  Team conferences with you and your family in attendance may also be held.  Expected length of stay: 7-10 days Overall anticipated outcome: supervision/min level  Depending on your progress and recovery, your program may change.  Your RN Case Estate agent will coordinate services and will keep you informed of any changes.  Your RN Sports coach and SW names and contact numbers are listed  below.  The following services may also be recommended but are not provided by the [facility]:   Driving Evaluations  Home Health Rehabiltiation Services  Outpatient Rehabilitatation [facility]  Vocational Rehabilitation   Arrangements will be made to provide these services after discharge if needed.  Arrangements include referral to agencies that provide these services.  Your insurance has been verified to be:  Medicare and UHC Your primary doctor is:  Dr Merlene Laughter  Pertinent information  will be shared with your doctor and your insurance company.   Social Worker:  Dossie Der, [place] [phone]  Information discussed with and copy given to patient by: Lucy Chris, 06/07/2012, 9:08 AM

## 2012-06-07 NOTE — Progress Notes (Signed)
Social Work Assessment and Plan Social Work Assessment and Plan  Patient Details  Name: [name] MRN: [id number] Date of Birth: Sep 15, 1924  Today's Date: 06/07/2012  Problem List:  Patient Active Problem List  Diagnosis  . Chest pain  . Vomiting  . Constipation  . Atrial fibrillation with RVR  . Diastolic heart failure, NYHA class 1  . Hypothyroidism  . Asthma  . Hypokalemia  . Hyponatremia  . E. coli UTI  . Sepsis  . Ischemic colitis  . C. difficile colitis  . Bloody diarrhea  . Acute respiratory failure with hypoxia  . Hypotension  . OSA (obstructive sleep apnea)  . Pulmonary edema  . Leukocytosis  . Physical deconditioning   Past Medical History:  Past Medical History  Diagnosis Date  . CHF (congestive heart failure)   . Hypertension   . Atrial fibrillation   . Peripheral neuropathy   . Asthma   . High cholesterol   . Type II diabetes mellitus   . PAD (peripheral artery disease)   . Enlarged heart   . Anginal pain 05/28/12  . Pneumonia 11/2010  . Sleep apnea   . Hypothyroidism   . Headache 05/29/12    "recently"  . Stroke 05/2005; 08/2010    !mini"  . Chronic kidney disease     "something on labs always say she has some problems"  . Personal history of gout    Past Surgical History:  Past Surgical History  Procedure Date  . Tonsillectomy and adenoidectomy 1960's  . Dilation and curettage of uterus   . Vaginal hysterectomy 1970's  . Total knee arthroplasty 1970-~2002    left; right  . Cataract extraction w/ intraocular lens  implant, bilateral ?1970's   Social History:  reports that she has never smoked. She has never used smokeless tobacco. She reports that she does not drink alcohol or use illicit drugs.  Family / Support Systems Marital Status: Widow/Widower Patient Roles: Parent ChildrenMarcelino Duster  [phone]  [phone]  [phone] Other Supports: Son and another daughter who are supportive Anticipated  Caregiver: Hired caregivers-thru [facility] 24 hours a day Ability/Limitations of Caregiver: Children work but pt has paid caregivers via nursing agency Caregiver Availability: 24/7 Family Dynamics: Pt reports all of her children are supportive and involved.  They call daily and come by when they can.  She feels very fortunante to have them watching out for her.  Social History Preferred language: English Religion: Baptist Cultural Background: No issues Education: Automotive engineer Courses Read: Yes Write: Yes Employment Status: Retired Fish farm manager Issues: No issues Guardian/Conservator: None-according to MD pt is capable to make deicisons on her own   Abuse/Neglect Physical Abuse: Denies Verbal Abuse: Denies Sexual Abuse: Denies Exploitation of patient/patient's resources: Denies Self-Neglect: Denies  Emotional Status Pt's affect, behavior adn adjustment status: Pt is motivated to imporve she reports her main issue is her strength and getting this back.  She hopes to wean off the oxygen and be able to do more for herself.  She was doing for herself prior to admission. Recent Psychosocial Issues: other medical issues Pyschiatric History: No history deferred depression screen due to pt feels doing well and not needed.  Will monitor her coping while here. Substance Abuse History: No issues  Patient / Family Perceptions, Expectations & Goals Pt/Family understanding of illness & functional limitations: Pt is able to explain her condition and deficits.  She feels she is weak and will get better now her medical issues are being  addressed.  She hopes not to be here long and be home soon.  She is willing to do what she can for herself. Premorbid pt/family roles/activities: Mother, Grandmother, Retiree, [employer], etc Anticipated changes in roles/activities/participation: resume Pt/family expectations/goals: Pt states: " I am hopeful I will do well here, need to get stronger."   She is motivated to do for herself and feesl positive she will achieve this.  Community Resources [employer]: Other (Comment) ([facility]-Hired Advertising account executive) Premorbid Home Care/DME Agencies: Other (Comment) ([facility]-CPAP machine) Transportation available at discharge: Family and Caregiver Resource referrals recommended: Support group (specify) (CHF Support group)  Discharge Planning Living Arrangements: Alone;Other (Comment) (24 hour caregiver) Support Systems: Children;Friends/neighbors;Church/faith community;Home care staff Type of Residence: Private residence Insurance Resources: Medicare;Private Insurance (specify) Pharmacologist) Financial Resources: Social Security Financial Screen Referred: No Living Expenses: Own Money Management: Patient;Family Do you have any problems obtaining your medications?: No Home Management: Hired Caregiver Patient/Family Preliminary Plans: Return home with hired assistance, wants to get mod/i level by discharge.  Children are supportive and involved. Social Work Anticipated Follow Up Needs: HH/OP;Support Group  Clinical Impression Pleasant female who is tired from two therapies back to back.  She is optimistic and hopeful she will do well here.  Main issue is getting stronger from infection and CHF. Should do well and be short length of stay.  Lucy Chris 06/07/2012, 10:30 AM

## 2012-06-08 ENCOUNTER — Inpatient Hospital Stay (HOSPITAL_COMMUNITY): Payer: Medicare Other | Admitting: Occupational Therapy

## 2012-06-08 ENCOUNTER — Inpatient Hospital Stay (HOSPITAL_COMMUNITY): Payer: Medicare Other | Admitting: Physical Therapy

## 2012-06-08 DIAGNOSIS — R5381 Other malaise: Secondary | ICD-10-CM

## 2012-06-08 DIAGNOSIS — Z5189 Encounter for other specified aftercare: Secondary | ICD-10-CM

## 2012-06-08 DIAGNOSIS — A4189 Other specified sepsis: Secondary | ICD-10-CM

## 2012-06-08 LAB — GLUCOSE, CAPILLARY
Glucose-Capillary: 158 mg/dL — ABNORMAL HIGH (ref 70–99)
Glucose-Capillary: 133 mg/dL — ABNORMAL HIGH (ref 70–99)

## 2012-06-08 MED ORDER — FUROSEMIDE 20 MG PO TABS
20.0000 mg | ORAL_TABLET | Freq: Every day | ORAL | Status: DC
  Filled 2012-06-08 (×2): qty 1

## 2012-06-08 MED ORDER — FUROSEMIDE 80 MG PO TABS
80.0000 mg | ORAL_TABLET | Freq: Every day | ORAL | Status: DC
  Administered 2012-06-08 – 2012-06-20 (×13): 80 mg via ORAL
  Filled 2012-06-08 (×15): qty 1

## 2012-06-08 NOTE — Progress Notes (Signed)
Rt  Hand phlebitis with more swelling,redness and warmer. to touch today md notified marked area with ink pen , ordered k pad for arm. Legs with more edema/ elevated

## 2012-06-08 NOTE — Progress Notes (Signed)
Physical Therapy Session Note  Patient Details  Name: [name] MRN: [id number] Date of Birth: 1924-03-17  Today's Date: 06/08/2012 Time: 1006-1103 and 1350-1430 Time Calculation (min): 57 min and 40 min  Short Term Goals: Week 1:  PT Short Term Goal 1 (Week 1): Patient will perform bed mobility on flat bed with min A and 50% verbal cues for sequence PT Short Term Goal 2 (Week 1): Patient will perform sit <> stand and stand pivot transfers with min A and 50% verbal cues for sequence PT Short Term Goal 3 (Week 1): Patient will perform w/c mobility on unit for UE strength/endurance with bilat UE propulsion x 150 with min A and 2 rest breaks PT Short Term Goal 4 (Week 1): Patient will perform gait training with 4WW x 50' with min A with improved upright trunk and step and stride length bilaterally PT Short Term Goal 5 (Week 1): Patient will perform up and down 4 steps with R rail and mod A for home entry/exit training  Skilled Therapeutic Interventions/Progress Updates:   Patient performed w/c mobility for UE strength/endurance on unit x 150' with bilat UE propulsion and mod A for propulsion sequence for changing direction and to maintain straight path; patient still using very small UE propulsion strokes.    Transfer training and LE strength training with squat pivot w/c > mat; verbalized and gave patient visual demonstration to lift buttocks and scoot it to front edge of w/c; patient required max A and total verbal, tactile and visual cues for sequence of anterior lean and maintaining hip elevation off the seat during forward translation to edge; patient attempting to push back and slide buttocks out.  For squat pivot patient required increased time and total verbal, visual and tactile cues for hand placement, full anterior lean and to maintain hip elevation and for pivoting to mat with max-total physical assistance.  Patient very resistant to forward lean--patient reports multiple falls  forward at home and is very fearful of falling.  Placed w/c in front of patient and performed multiple sit > squats from elevated mat with bilat UE support forward on w/c arm rests to facilitate full anterior lean with upright trunk/posture to bring COG over BOS and maintain squat position with max A during R and L lateral weight shifts/pivots.  Performed one sit > stand from elevated mat with UE support on w/c push handles and max A and focus on R and L lateral weight shifts and activation of L LE extensors in stance for stability during RLE forward and retro stepping; patient very fearful of L lateral weight shifting and weight acceptance on LLE.  Transfer mat > w/c max-total squat pivot.  PM session: Daughter present to observe.  When RN came in to remove patient's IV showed RN patient's R hand-swollen, red, warm.  Also reported to RN that patient wore support stockings at home for edema management and daughter reports that patient's LE are extremely edematous secondary to not wearing support stockings.  RN to contact MD about hand and LE edema management.    Patient performed gait training with regular RW x 15' with mod-max A for cues for upright posture, lateral weight shifting, cues for full step and stride length and increased stance time LLE; patient still presents with wide BOS, flexed posture, shuffling step to gait pattern.  Reviewed with daughter and patient stair negotiation sequence at home.  Daughter reports that patient has 8 STE with L rail (patient reported 4 STE with R  rail).  Daughter reports patient would ascend laterally with bilat UE support on L rail.  Verbalized and demonstrated to patient sequence ascending with RLE but when asked to return demonstrate patient was unable to recall sequence; required total verbal and tactile cues for hand placement on L rail and for correct LE to ascend with.  Patient unable to ascend to next step secondary to LE weakness even with total A. Patient  reporting need to have BM.  Returned to room and ambulated with RW and mod-max A w/c > toilet and stood with UE support on rail with assistance to doff pants and brief.  Patient noted to have incontinent BM in brief.  Tech alerted to assist patient with hygiene and off toilet when patient finished.    Therapy Documentation Precautions:  Precautions Precautions: Fall Precaution Comments: DNR; maintain Sp02 >90% on 2L02, monitor HR and saturation Restrictions Weight Bearing Restrictions: No Vital Signs:  Sp02: 95% on 1L 02 via [place];  Pain: Pain Assessment Pain Assessment: No/denies pain  See FIM for current functional status  Therapy/Group: Individual Therapy  [address]man [address] [facility] 06/08/2012, 12:07 PM

## 2012-06-08 NOTE — Progress Notes (Signed)
Patient ID: [name], female   DOB: [birth year], 78 y.o.   MRN: [id number] Subjective/Complaints: No complaints   Objective: Vital Signs: Blood pressure 109/56, pulse 68, temperature 98.4 F (36.9 C), temperature source Oral, resp. rate 20, height 5\' 7"  (1.702 m), weight 192 lb 10.9 oz (87.4 kg), SpO2 98.00%. No results found.  Basename 06/07/12 0500 06/06/12 0520  WBC 8.9 9.6  HGB 11.1* 10.7*  HCT 32.8* 31.2*  PLT 204 191    Basename 06/07/12 0500 06/06/12 0520  NA 135 136  K 3.9 4.5  CL 99 101  CO2 29 29  GLUCOSE 91 92  BUN 9 11  CREATININE 0.84 0.85  CALCIUM 8.1* 7.9*   CBG (last 3)   Basename 06/07/12 2122 06/07/12 1815 06/07/12 1153  GLUCAP 121* 98 184*    Wt Readings from Last 3 Encounters:  06/07/12 192 lb 10.9 oz (87.4 kg)  06/06/12 190 lb (86.183 kg)    Physical Exam:   No complaints.  Well-developed well-nourished female in no acute distress. HEENT exam atraumatic, normocephalic, extraocular muscles are intact. Neck is supple. No jugular venous distention no thyromegaly. Chest clear to auscultation without increased work of breathing. Cardiac exam S1 and S2 are regular. Abdominal exam active bowel sounds, soft, nontender. Extremities no edema. Neurologic exam she is alert without any motor sensory deficits. Gait is normal.    Assessment/Plan: 1. Functional deficits secondary to deconditioning after urosepsis and multiple medical complications which require 3+ hours per day of interdisciplinary therapy in a comprehensive inpatient rehab setting. Physiatrist is providing close team supervision and 24 hour management of active medical problems listed below. Physiatrist and rehab team continue to assess barriers to discharge/monitor patient progress toward functional and medical goals. FIM: FIM - Bathing Bathing Steps Patient Completed: Chest;Right Arm;Left Arm;Front perineal area;Abdomen;Right upper leg;Left upper leg Bathing: 3: Mod-Patient  completes 5-7 22f 10 parts or 50-74%  FIM - Upper Body Dressing/Undressing Upper body dressing/undressing steps patient completed: Thread/unthread right bra strap;Thread/unthread left bra strap;Thread/unthread right sleeve of pullover shirt/dresss;Thread/unthread left sleeve of pullover shirt/dress;Put head through opening of pull over shirt/dress;Pull shirt over trunk Upper body dressing/undressing: 4: Min-Patient completed 75 plus % of tasks FIM - Lower Body Dressing/Undressing Lower body dressing/undressing steps patient completed: Pull underwear up/down;Pull pants up/down Lower body dressing/undressing: 1: Total-Patient completed less than 25% of tasks  FIM - Toileting Toileting steps completed by patient: Adjust clothing prior to toileting Toileting Assistive Devices: Grab bar or rail for support Toileting: 1: Total-Patient completed zero steps, helper did all 3  FIM - Diplomatic Services operational officer Devices: Grab bars Toilet Transfers: 3-To toilet/BSC: Mod A (lift or lower assist);2-From toilet/BSC: Max A (lift and lower assist)  FIM - Press photographer Assistive Devices: Bed rails;Arm rests Bed/Chair Transfer: 4: Supine > Sit: Min A (steadying Pt. > 75%/lift 1 leg);3: Bed > Chair or W/C: Mod A (lift or lower assist)  FIM - Locomotion: Wheelchair Distance: 5' with mod-max A for hand placement and UE propulsion sequence to maintain straight navigation and for changing direction; patient tends to propel with very short strokes.   Locomotion: Wheelchair: 2: Travels 50 - 149 ft with maximal assistance (Pt: 25 - 49%) FIM - Locomotion: Ambulation Locomotion: Ambulation Assistive Devices: Walker - Rolling Ambulation/Gait Assistance: 2: Max assist Locomotion: Ambulation: 1: Travels less than 50 ft with maximal assistance (Pt: 25 - 49%)  Comprehension Comprehension Mode: Auditory Comprehension: 4-Understands basic 75 - 89% of the time/requires cueing 10 -  24% of the time  Expression Expression Mode: Verbal Expression: 4-Expresses basic 75 - 89% of the time/requires cueing 10 - 24% of the time. Needs helper to occlude trach/needs to repeat words.  Social Interaction Social Interaction: 6-Interacts appropriately with others with medication or extra time (anti-anxiety, antidepressant).  Problem Solving Problem Solving: 4-Solves basic 75 - 89% of the time/requires cueing 10 - 24% of the time  Memory Memory: 4-Recognizes or recalls 75 - 89% of the time/requires cueing 10 - 24% of the time  Medical Problem List and Plan:  1. DVT Prophylaxis/Anticoagulation: Pharmaceutical: Pradexa  2. Pain Management: N/A. Continue neurontin for diabetic neuropathy.  3. Mood: alert and appropriate. Will monitor for now.  4. Neuropsych: This patient is capable of making decisions on his/her own behalf.  5. Acute on chronic CHF: daily weights. Low salt diet. Monitor for signs of fluid overload.  6. Atrial Fibrillation: Monitor HR with bid checks. Continue amiodarone, cardizem and pradaxa. Monitor H/H closely.  7. Hypothyroid: On supplement.  8. E. Coli UTI: continue rocephin thru 8/13 9. DM type 2 with peripheral neuropathy: monitor BS with AC/HS cbg checks. Glucotrol on hold currently. Monitor po intake and will resume if BS on upward trend.  10. Diarrhea: Continue probiotic. C-diff negative. Treat skin irritation  11. ABLA: H/H has varied from 10.5-12.4 range. Will monitor especially with pradaxa resumed. Monitor for bloody diarrhea. Will recheck in am and again next Monday.  12. Insomnia: D/C ambien. Family to provide home RX sonata which is very effective  LOS (Days) 2 A FACE TO FACE EVALUATION WAS PERFORMED  [name] HENRY 06/08/2012, 10:22 AM

## 2012-06-08 NOTE — Progress Notes (Addendum)
Occupational Therapy Session Note  Patient Details  Name: TIAHNA CURE MRN: [id number] Date of Birth: 02-02-1924  Today's Date: 06/08/2012 Time: 0900-0955 and 1300-1330 Time Calculation (min): 55 min and 30 min  Short Term Goals: Week 1:  OT Short Term Goal 1 (Week 1): Pt will complete sit to stand at toilet with min assist. OT Short Term Goal 2 (Week 1): Pt will be able to cleanse self on toilet. OT Short Term Goal 3 (Week 1): Pt will be able to bathe with min assist. OT Short Term Goal 4 (Week 1): Pt will transfer to toilet with mod assist x 1. OT Short Term Goal 5 (Week 1): Pt will transfer to shower seat with mod assist x 1.  Skilled Therapeutic Interventions/Progress Updates:    1) Pt seen for ADL retraining seated at EOB.  Focus on sitting balance, activity tolerance, sit <> stand, and transfers.  Pt reporting fatigue, but willing to participate in therapy.  Pt with 1 LOB backward sitting EOB, requiring cues and mod assist to return to sitting upright.  Pt completed UB bathing and dressing with supervision, except assist provided to hook bra.  Pt required total assist with LB bathing and dressing secondary to decreased balance and SOB with bending forward.  Pt incontinent of bowel in brief, assisted with changing brief.  Pt able to pull up Depends brief and pants, after assist donning both over legs.  Verbal and tactile cues with weight shifting for sit to stand to pull up brief and pants.  Pt completed grooming with setup assist at sink.  2) 1:1 OT with focus on BUE strengthening and activity tolerance/endurance.  Focus on scooting forward and to Rt and Lt, squat pivot transfer, and BUE strengthening.  Pt required increased time to scoot forward in preparation for squat pivot transfer.  Mod assist squat pivot transfer with lifting assist.  Engaged in theraband activities with focus on activity tolerance and use of RUE (pain in hand secondary to former IV site).  Pt O2 level remained  >95% throughout session.  Therapy Documentation Precautions:  Precautions Precautions: Fall Precaution Comments: DNR; maintain Sp02 >90% on 2L02, monitor HR and saturation Restrictions Weight Bearing Restrictions: No Pain: Pain Assessment Pain Assessment: No/denies pain  See FIM for current functional status  Therapy/Group: Individual Therapy  Leonette Monarch 06/08/2012, 9:56 AM

## 2012-06-09 ENCOUNTER — Inpatient Hospital Stay (HOSPITAL_COMMUNITY): Payer: Medicare Other

## 2012-06-09 LAB — GLUCOSE, CAPILLARY: Glucose-Capillary: 98 mg/dL (ref 70–99)

## 2012-06-09 NOTE — Progress Notes (Signed)
Occupational Therapy Note  Patient Details  Name: YANELI KEITHLEY MRN: [id number] Date of Birth: 05/30/1924 Today's Date: 06/09/2012  Time: 1300-1355 Pt denies pain Individual Therapy  Pt in bed and stated that she felt like she had a bowel movement.  Pt stated she had called for a bed pan but no one had come to her room.  Pt participated in rolling in bed to allow therapist to remove soiled diaper and perform hygiene.  Pt used side rails to roll and required min A to raise knees in preparation for rolling. Pt performed supine to sit with min A but was able to scoot forward on bed to place feet ob floor without assistance.  Pt performed squat pivot transfer with max A.  Pt stated she felt like her legs were giving out but therapist did not detect any change in assist level.  Pt able to position self in chair without assistance.  Pt transitioned to sit<>stand and standing activities.  Pt able to stand approx 15 seconds with mod A before requesting to sit. Pt states her legs feel weak and she is afraid she is going to fall.  Pt exhibits increased anxiety with transitional movement and sit<>stand/standing.  Focus on activity tolerance, bed mobility, transitional movements, standing, and safety awareness.   Lavone Neri [facility] 06/09/2012, 1:58 PM

## 2012-06-09 NOTE — Progress Notes (Signed)
Patient ID: [name], female   DOB: 19-Jan-1924, 78 y.o.   MRN: [id number] Subjective/Complaints: Patient denies complaints. Yesterday she noted some lower surgery edema. She normally takes furosemide at home. That was restarted yesterday. She states she's had significant urinary output since that time.  Objective: Vital Signs: Blood pressure 107/61, pulse 81, temperature 98.3 F (36.8 C), temperature source Oral, resp. rate 18, height 5\' 7"  (1.702 m), weight 193 lb 5.5 oz (87.7 kg), SpO2 99.00%. No results found.  Basename 06/07/12 0500  WBC 8.9  HGB 11.1*  HCT 32.8*  PLT 204    Basename 06/07/12 0500  NA 135  K 3.9  CL 99  CO2 29  GLUCOSE 91  BUN 9  CREATININE 0.84  CALCIUM 8.1*   CBG (last 3)   Basename 06/08/12 2059 06/08/12 1633 06/08/12 1122  GLUCAP 161* 133* 158*    Wt Readings from Last 3 Encounters:  06/09/12 193 lb 5.5 oz (87.7 kg)  06/06/12 190 lb (86.183 kg)    Physical Exam:   No complaints.  Well-developed well-nourished female in no acute distress. HEENT exam atraumatic, normocephalic, extraocular muscles are intact. Neck is supple. No jugular venous distention no thyromegaly. Chest clear to auscultation without increased work of breathing. Cardiac exam S1 and S2 are regular. Abdominal exam active bowel sounds, soft, nontender. 1+ edema to the pretibial area bilaterally.   Assessment/Plan: 1. Functional deficits secondary to deconditioning after urosepsis and multiple medical complications which require 3+ hours per day of interdisciplinary therapy in a comprehensive inpatient rehab setting. Physiatrist is providing close team supervision and 24 hour management of active medical problems listed below. Physiatrist and rehab team continue to assess barriers to discharge/monitor patient progress toward functional and medical goals. FIM: FIM - Bathing Bathing Steps Patient Completed: Chest;Right Arm;Left Arm;Front perineal area;Abdomen;Right upper  leg;Left upper leg Bathing: 3: Mod-Patient completes 5-7 25f 10 parts or 50-74%  FIM - Upper Body Dressing/Undressing Upper body dressing/undressing steps patient completed: Thread/unthread right bra strap;Thread/unthread left bra strap;Thread/unthread right sleeve of pullover shirt/dresss;Thread/unthread left sleeve of pullover shirt/dress;Put head through opening of pull over shirt/dress;Pull shirt over trunk Upper body dressing/undressing: 4: Min-Patient completed 75 plus % of tasks FIM - Lower Body Dressing/Undressing Lower body dressing/undressing steps patient completed: Pull underwear up/down;Pull pants up/down Lower body dressing/undressing: 1: Total-Patient completed less than 25% of tasks  FIM - Toileting Toileting steps completed by patient: Adjust clothing prior to toileting Toileting Assistive Devices: Grab bar or rail for support Toileting: 1: Total-Patient completed zero steps, helper did all 3  FIM - Diplomatic Services operational officer Devices: Therapist, music Transfers: 1-Two helpers  FIM - Banker Devices: Bed rails;Arm rests Bed/Chair Transfer: 1: Two helpers  FIM - Locomotion: Wheelchair Distance: 44' with mod-max A for hand placement and UE propulsion sequence to maintain straight navigation and for changing direction; patient tends to propel with very short strokes.   Locomotion: Wheelchair: 2: Travels 50 - 149 ft with maximal assistance (Pt: 25 - 49%) FIM - Locomotion: Ambulation Locomotion: Ambulation Assistive Devices: Designer, industrial/product Ambulation/Gait Assistance: 2: Max assist Locomotion: Ambulation: 1: Travels less than 50 ft with maximal assistance (Pt: 25 - 49%)  Comprehension Comprehension Mode: Auditory Comprehension: 5-Understands basic 90% of the time/requires cueing < 10% of the time  Expression Expression Mode: Verbal Expression: 5-Expresses basic 90% of the time/requires cueing < 10% of the  time.  Social Interaction Social Interaction: 6-Interacts appropriately with others with medication or extra  time (anti-anxiety, antidepressant).  Problem Solving Problem Solving: 5-Solves basic 90% of the time/requires cueing < 10% of the time  Memory Memory: 5-Recognizes or recalls 90% of the time/requires cueing < 10% of the time  Medical Problem List and Plan:  1. DVT Prophylaxis/Anticoagulation: Pharmaceutical: Pradexa  2. Pain Management: N/A. Continue neurontin for diabetic neuropathy.  3. Mood: alert and appropriate. Will monitor for now.  4. Neuropsych: This patient is capable of making decisions on his/her own behalf.  5. Acute on chronic CHF: daily weights. Low salt diet. Monitor for signs of fluid overload. Started for is Kathlene November yesterday. 6. Atrial Fibrillation: Monitor HR with bid checks. Continue amiodarone, cardizem and pradaxa. Monitor H/H closely.  7. Hypothyroid: On supplement.  8. E. Coli UTI: continue rocephin thru 8/13 9. DM type 2 with peripheral neuropathy: monitor BS with AC/HS cbg checks. Glucotrol on hold currently. Monitor po intake and will resume if BS on upward trend.  10. Diarrhea: Patient states significantly better. 11. ABLA: H/H has varied from 10.5-12.4 range. Will monitor especially with pradaxa resumed. Monitor for bloody diarrhea. Will recheck in am and again next Monday.  12. Insomnia: D/C ambien. Family to provide home RX sonata which is very effective  LOS (Days) 3 A FACE TO FACE EVALUATION WAS PERFORMED  SWORDS,BRUCE HENRY 06/09/2012, 9:35 AM

## 2012-06-10 ENCOUNTER — Inpatient Hospital Stay (HOSPITAL_COMMUNITY): Payer: Medicare Other | Admitting: Physical Therapy

## 2012-06-10 ENCOUNTER — Inpatient Hospital Stay (HOSPITAL_COMMUNITY): Payer: Medicare Other | Admitting: Occupational Therapy

## 2012-06-10 LAB — GLUCOSE, CAPILLARY
Glucose-Capillary: 106 mg/dL — ABNORMAL HIGH (ref 70–99)
Glucose-Capillary: 156 mg/dL — ABNORMAL HIGH (ref 70–99)
Glucose-Capillary: 191 mg/dL — ABNORMAL HIGH (ref 70–99)

## 2012-06-10 LAB — BASIC METABOLIC PANEL
Chloride: 96 mEq/L (ref 96–112)
GFR calc Af Amer: 76 mL/min — ABNORMAL LOW (ref >90)
Potassium: 3.4 mEq/L — ABNORMAL LOW (ref 3.5–5.1)
Sodium: 136 mEq/L (ref 135–145)

## 2012-06-10 LAB — CLOSTRIDIUM DIFFICILE BY PCR: Toxigenic C. Difficile by PCR: NEGATIVE

## 2012-06-10 MED ORDER — POTASSIUM CHLORIDE CRYS ER 10 MEQ PO TBCR
10.0000 meq | EXTENDED_RELEASE_TABLET | Freq: Two times a day (BID) | ORAL | Status: DC
  Administered 2012-06-10 – 2012-06-12 (×4): 10 meq via ORAL
  Filled 2012-06-10 (×6): qty 1

## 2012-06-10 MED ORDER — ENSURE COMPLETE PO LIQD
237.0000 mL | ORAL | Status: DC
  Administered 2012-06-10 – 2012-06-12 (×3): 237 mL via ORAL

## 2012-06-10 MED ORDER — CALCIUM POLYCARBOPHIL 625 MG PO TABS
1250.0000 mg | ORAL_TABLET | Freq: Every day | ORAL | Status: DC
  Administered 2012-06-10 – 2012-06-17 (×8): 1250 mg via ORAL
  Filled 2012-06-10 (×9): qty 2

## 2012-06-10 NOTE — Progress Notes (Signed)
Physical Therapy Session Note  Patient Details  Name: [name] MRN: [id number] Date of Birth: 04-02-1924  Today's Date: 06/10/2012 Time: 0810-0850 Time Calculation (min): 40 min  Short Term Goals: Week 1:  PT Short Term Goal 1 (Week 1): Patient will perform bed mobility on flat bed with min A and 50% verbal cues for sequence PT Short Term Goal 2 (Week 1): Patient will perform sit <> stand and stand pivot transfers with min A and 50% verbal cues for sequence PT Short Term Goal 3 (Week 1): Patient will perform w/c mobility on unit for UE strength/endurance with bilat UE propulsion x 150 with min A and 2 rest breaks PT Short Term Goal 4 (Week 1): Patient will perform gait training with 4WW x 50' with min A with improved upright trunk and step and stride length bilaterally PT Short Term Goal 5 (Week 1): Patient will perform up and down 4 steps with R rail and mod A for home entry/exit training  Skilled Therapeutic Interventions/Progress Updates:   See below for details.  Pt honest that she does not feel like doing therapy but is ready to try.  Pt able to direct the assistance that she needed.  EHR=106, O2=96, on 2L  Therapy Documentation Precautions:  Precautions Precautions: Fall Precaution Comments: DNR; maintain Sp02 >90% on 2L02, monitor HR and saturation Restrictions Weight Bearing Restrictions: No Pain:  No pain Mobility:  Supine to sit with HOB elevated with supervision, including scooting EOB.  Sit to stand with mod@ needing instructional cues for hand placement. Locomotion :  Gait training with RW x 40' with min@, wide BOS and short step length.   Other Treatments:   Assisted pt in donning clothes prior to session per pt request.  Overall with max@ due to trying to maximize therapy time. See FIM for current functional status  Therapy/Group: Individual Therapy  Georges Mouse 06/10/2012, 9:03 AM

## 2012-06-10 NOTE — Progress Notes (Signed)
Occupational Therapy Session Note  Patient Details  Name: LEOMIA BLAKE MRN: [id number] Date of Birth: 1924-05-19  Today's Date: 06/10/2012 Time: 4098-1191 Time Calculation (min): 65 min  Short Term Goals: Week 1:  OT Short Term Goal 1 (Week 1): Pt will complete sit to stand at toilet with min assist. OT Short Term Goal 2 (Week 1): Pt will be able to cleanse self on toilet. OT Short Term Goal 3 (Week 1): Pt will be able to bathe with min assist. OT Short Term Goal 4 (Week 1): Pt will transfer to toilet with mod assist x 1. OT Short Term Goal 5 (Week 1): Pt will transfer to shower seat with mod assist x 1.  Skilled Therapeutic Interventions/Progress Updates:    Pt seen for ADL retraining at sink.  Pt reports already getting dressed this AM but wanting to wash underarms and buttocks.  Pt completed modified bathing with setup assist and mod assist sit to stand for LB.  Engaged in grooming with setup assist to open mouthwash.  Engaged in BUE strengthening seated at edge of therapy mat with focus on trunk control, dynamic sitting balance, and BUE strengthening.  Use of 2# medicine ball with chest presses and horizontal abduction and adduction, and reaching in various planes focusing on core and UB strengthening; 1# dowel rod chest presses with batting beach ball back to this therapist.  Engaged in squats and sit <> stand to promote LB strengthening to assist with toileting.  Pt reports having to toilet.  Squat pivot transfer with mod assist with use of grab bar to toilet.  Pt required assist with doffing pants secondary to urgency, pt completed hygiene and assist with donning pants.    Therapy Documentation Precautions:  Precautions Precautions: Fall Precaution Comments: DNR; maintain Sp02 >90% on 2L02, monitor HR and saturation Restrictions Weight Bearing Restrictions: No General:   Vital Signs: Therapy Vitals Pulse Rate: 71  Resp: 18  BP: 110/59 mmHg Patient Position, if  appropriate: Sitting Pain: Pain Assessment Pain Assessment: No/denies pain  See FIM for current functional status  Therapy/Group: Individual Therapy  Leonette Monarch 06/10/2012, 12:00 PM

## 2012-06-10 NOTE — Progress Notes (Signed)
Physical Therapy Note  Patient Details  Name: TYWANA ROBOTHAM MRN: [id number] Date of Birth: August 10, 1924 Today's Date: 06/10/2012  10:30-11:30 individual therapy pt denied pain.  Performed gait 2x50' With rw min assist with vc for walker placement.pt ambulated on 2 LO2. O2 94% HR110bpm. Performed 10 stands with 1 UE support from elevated mat supervision., performed dynamic standing balance with 1 UE support for rotation, squatting, and reaching overhead minguard assist.  [name] 06/10/2012, 11:56 AM

## 2012-06-10 NOTE — Progress Notes (Signed)
Physical Therapy Note  Patient Details  Name: [name] MRN: [id number] Date of Birth: 06-06-1924 Today's Date: 06/10/2012  15:30-16:00 individua therapy pt denied pain. Pt now on C-Diff precautions.  performed 8 steps with 2 rails min assist x 2 with seated rest. Pt fluctuated between step to and alternating pattern. O2 96%, HR 115bpm that decreased quickly with rest.   [name] 06/10/2012, 4:18 PM

## 2012-06-10 NOTE — Progress Notes (Signed)
INITIAL ADULT NUTRITION ASSESSMENT Date: 06/10/2012   Time: 9:51 AM  Reason for Assessment: RN Consult  ASSESSMENT: Female 78 y.o.  Dx: Physical deconditioning  Hx:  Past Medical History  Diagnosis Date  . CHF (congestive heart failure)   . Hypertension   . Atrial fibrillation   . Peripheral neuropathy   . Asthma   . High cholesterol   . Type II diabetes mellitus   . PAD (peripheral artery disease)   . Enlarged heart   . Anginal pain 05/28/12  . Pneumonia 11/2010  . Sleep apnea   . Hypothyroidism   . Headache 05/29/12    "recently"  . Stroke 05/2005; 08/2010    !mini"  . Chronic kidney disease     "something on labs always say she has some problems"  . Personal history of gout    Past Surgical History  Procedure Date  . Tonsillectomy and adenoidectomy 1960's  . Dilation and curettage of uterus   . Vaginal hysterectomy 1970's  . Total knee arthroplasty 1970-~2002    left; right  . Cataract extraction w/ intraocular lens  implant, bilateral ?1970's   Related Meds:     . allopurinol  300 mg Oral Daily  . amiodarone  200 mg Oral Daily  . antiseptic oral rinse  15 mL Mouth Rinse QID  . atorvastatin  10 mg Oral QPC supper  . cefTRIAXone (ROCEPHIN)  IV  1 g Intravenous Q24H  . dabigatran  75 mg Oral Q12H  . diltiazem  30 mg Oral Q6H  . famotidine  20 mg Oral QHS  . feeding supplement  1 Container Oral BID BM  . furosemide  80 mg Oral Daily  . gabapentin  300 mg Oral QHS  . insulin aspart  0-5 Units Subcutaneous QHS  . insulin aspart  0-9 Units Subcutaneous TID WC  . insulin aspart  0-9 Units Subcutaneous TID AC  . levothyroxine  88 mcg Oral QAC breakfast  . nystatin   Topical BID  . saccharomyces boulardii  250 mg Oral BID  . zaleplon  10 mg Oral QHS   Ht: 5\' 7"  (170.2 cm)  Wt: 191 lb 2.2 oz (86.7 kg)  Ideal Wt: 61.4 kg % Ideal Wt: 141%  Wt Readings from Last 15 Encounters:  06/10/12 191 lb 2.2 oz (86.7 kg)  06/06/12 190 lb (86.183 kg)  Usual Wt: 179  lb % Usual Wt: 107%  Body mass index is 29.94 kg/(m^2). Overweight  Food/Nutrition Related Hx: decreased appetite for the past month  Labs:  CMP     Component Value Date/Time   NA 136 06/10/2012 0730   K 3.4* 06/10/2012 0730   CL 96 06/10/2012 0730   CO2 34* 06/10/2012 0730   GLUCOSE 97 06/10/2012 0730   BUN 7 06/10/2012 0730   CREATININE 0.80 06/10/2012 0730   CALCIUM 7.5* 06/10/2012 0730   PROT 4.5* 06/07/2012 0500   ALBUMIN 1.7* 06/07/2012 0500   AST 20 06/07/2012 0500   ALT 11 06/07/2012 0500   ALKPHOS 83 06/07/2012 0500   BILITOT 0.3 06/07/2012 0500   GFRNONAA 65* 06/10/2012 0730   GFRAA 76* 06/10/2012 0730   CBG (last 3)   Basename 06/10/12 0707 06/09/12 2022 06/09/12 1628  GLUCAP 106* 133* 98    Intake/Output Summary (Last 24 hours) at 06/10/12 0954 Last data filed at 06/10/12 0746  Gross per 24 hour  Intake    600 ml  Output      0 ml  Net  600 ml   Diet Order: [name]  Supplements/Tube Feeding: Ensure Pudding PO BID  IVF:    Estimated Nutritional Needs:   Kcal: 1500 - 1700 kcal Protein:  75 - 85 grams protein Fluid: 1.7 - 2 liters daily  Nutrition hx obtained during acute hospitalization by RD. On 8/1, pt reported a decreased appetite for the past month; stated she has been eating 3 meals per day, however, smaller portions than her usual intake (for example: jello & toast for breakfast); noted experienced nausea & vomiting as well; states she's lost approximately 12 lb. Pt was agreeable to Ensure Pudding at that time and is currently still ordered for it. Pt states that she likes Ensure Pudding and would also like to receive Ensure Complete (chocolate) daily.   Patient met criteria for non-severe (moderate) malnutrition in the context of chronic illness given < 75% intake of estimated energy requirement for > 1 month and 7% weight loss x 1 month.  Note that pt's weight is currently up to 191 lb. Per MD note, pt with acute on chronic CHF. Daily weights with low salt diet.  Monitor for signs of fluid overload.  Intake has improved during admission to rehab. Pt is now eating 80-100% of meals per chart, however pt states she is only consuming 50%. She notes that her appetite is not back to where it should be. Does enjoy supplements.  NUTRITION DIAGNOSIS: -Inadequate oral intake (NI-2.1).  Status: Ongoing  RELATED TO: poor appetite  AS EVIDENCE BY: pt report  MONITORING/EVALUATION(Goals): Goal: Pt to meet >/= 90% of their estimated nutrition needs Monitor: weights, labs, PO intake, I/O's  EDUCATION NEEDS: -No education needs identified at this time  INTERVENTION: 1. Continue Ensure Pudding PO BID 2. Add Ensure Complete PO daily (prefers chocolate) 3. Continue to encourage at meal times 4. RD to continue to follow nutrition care plan   DOCUMENTATION CODES Per approved criteria  -Non-severe (moderate) malnutrition in the context of chronic illness   Jarold Motto MS, RD, LDN Pager: [phone] After-hours pager: [phone]

## 2012-06-10 NOTE — Progress Notes (Signed)
Placed pt on nasal cpap with 3l 02. Pt tolerating well. RT to continue to monitor.

## 2012-06-10 NOTE — Progress Notes (Signed)
Patient ID: [name], female   DOB: Jun 30, 1924, 78 y.o.   MRN: [id number] Subjective/Complaints: No breathing problems, on O2. Good night. A 12 point review of systems has been performed and if not noted above is otherwise negative.   Objective: Vital Signs: Blood pressure 123/71, pulse 87, temperature 98.8 F (37.1 C), temperature source Oral, resp. rate 18, height 5\' 7"  (1.702 m), weight 86.7 kg (191 lb 2.2 oz), SpO2 97.00%. No results found. No results found for this basename: WBC:2,HGB:2,HCT:2,PLT:2 in the last 72 hours  Basename 06/10/12 0730  NA 136  K 3.4*  CL 96  CO2 34*  GLUCOSE 97  BUN 7  CREATININE 0.80  CALCIUM 7.5*   CBG (last 3)   Basename 06/10/12 0707 06/09/12 2022 06/09/12 1628  GLUCAP 106* 133* 98    Wt Readings from Last 3 Encounters:  06/10/12 86.7 kg (191 lb 2.2 oz)  06/06/12 86.183 kg (190 lb)    Physical Exam:   General: Alert and oriented x 3, No apparent distress HEENT: Head is normocephalic, atraumatic, PERRLA, EOMI, sclera anicteric, oral mucosa pink and moist, dentition intact, ext ear canals clear,  Neck: Supple without JVD or lymphadenopathy Heart: Reg rate and rhythm. No murmurs rubs or gallops Chest: CTA bilaterally without wheezes, rales, or rhonchi; no distress Abdomen: Soft, non-tender, non-distended, bowel sounds positive. Extremities: No clubbing, cyanosis, or edema. Pulses are 2+ Skin: Clean and intact without signs of breakdown Neuro: Pt is cognitively appropriate with normal insight, memory, and awareness. Cranial nerves 2-12 are intact. Sensory exam is normal. Reflexes are 2+ in all 4's. Fine motor coordination is intact. No tremors. Motor function is grossly 5/5.  Musculoskeletal: Full ROM, No pain with AROM or PROM in the neck, trunk, or extremities. Posture appropriate Psych: Pt's affect is appropriate. Pt is cooperative   Assessment/Plan: 1. Functional deficits secondary to deconditioning after urosepsis and  multiple medical complications which require 3+ hours per day of interdisciplinary therapy in a comprehensive inpatient rehab setting. Physiatrist is providing close team supervision and 24 hour management of active medical problems listed below. Physiatrist and rehab team continue to assess barriers to discharge/monitor patient progress toward functional and medical goals. FIM: FIM - Bathing Bathing Steps Patient Completed: Chest;Right Arm;Left Arm;Front perineal area;Abdomen;Right upper leg;Left upper leg Bathing: 3: Mod-Patient completes 5-7 9f 10 parts or 50-74%  FIM - Upper Body Dressing/Undressing Upper body dressing/undressing steps patient completed: Thread/unthread right bra strap;Thread/unthread left bra strap;Thread/unthread right sleeve of pullover shirt/dresss;Thread/unthread left sleeve of pullover shirt/dress;Put head through opening of pull over shirt/dress;Pull shirt over trunk Upper body dressing/undressing: 4: Min-Patient completed 75 plus % of tasks FIM - Lower Body Dressing/Undressing Lower body dressing/undressing steps patient completed: Pull underwear up/down;Pull pants up/down Lower body dressing/undressing: 1: Total-Patient completed less than 25% of tasks  FIM - Toileting Toileting steps completed by patient: Adjust clothing prior to toileting Toileting Assistive Devices: Grab bar or rail for support Toileting: 1: Total-Patient completed zero steps, helper did all 3  FIM - Diplomatic Services operational officer Devices: Therapist, music Transfers: 1-Two helpers  FIM - Banker Devices: Bed rails;Arm rests Bed/Chair Transfer: 1: Two helpers  FIM - Locomotion: Wheelchair Distance: 15' with mod-max A for hand placement and UE propulsion sequence to maintain straight navigation and for changing direction; patient tends to propel with very short strokes.   Locomotion: Wheelchair: 2: Travels 50 - 149 ft with maximal  assistance (Pt: 25 - 49%) FIM - Locomotion: Ambulation  Locomotion: Ambulation Assistive Devices: Designer, industrial/product Ambulation/Gait Assistance: 2: Max assist Locomotion: Ambulation: 1: Travels less than 50 ft with maximal assistance (Pt: 25 - 49%)  Comprehension Comprehension Mode: Auditory Comprehension: 5-Understands complex 90% of the time/Cues < 10% of the time  Expression Expression Mode: Verbal Expression: 5-Expresses basic needs/ideas: With no assist  Social Interaction Social Interaction: 6-Interacts appropriately with others with medication or extra time (anti-anxiety, antidepressant).  Problem Solving Problem Solving: 5-Solves basic problems: With no assist  Memory Memory: 5-Recognizes or recalls 90% of the time/requires cueing < 10% of the time  Medical Problem List and Plan:  1. DVT Prophylaxis/Anticoagulation: Pharmaceutical: Pradexa  2. Pain Management: N/A. Continue neurontin for diabetic neuropathy.  3. Mood: alert and appropriate. Will monitor for now.  4. Neuropsych: This patient is capable of making decisions on his/her own behalf.  5. Acute on chronic CHF: daily weights. Low salt diet. Monitor for signs of fluid overload.  6. Atrial Fibrillation: Monitor HR with bid checks. Continue amiodarone, cardizem and pradaxa. Monitor H/H closely.  7. Hypothyroid: On supplement.  8. E. Coli UTI: continue rocephin thru 8/13 9. DM type 2 with peripheral neuropathy: monitor BS with AC/HS cbg checks. Glucotrol on hold currently. Monitor po intake and will resume if BS on upward trend.  10. Diarrhea: Continue probiotic. C-diff negative. Treat skin irritation  11. ABLA: H/H has varied from 10.5-12.4 range. Will monitor especially with pradaxa resumed. Monitor for bloody diarrhea. Will recheck in am and again next Monday.  12. Insomnia: D/C ambien. Family to provide home RX sonata which is very effective  LOS (Days) 4 A FACE TO FACE EVALUATION WAS PERFORMED  Vienne Corcoran  E 06/10/2012, 8:43 AM

## 2012-06-11 ENCOUNTER — Inpatient Hospital Stay (HOSPITAL_COMMUNITY): Payer: Medicare Other | Admitting: Occupational Therapy

## 2012-06-11 ENCOUNTER — Inpatient Hospital Stay (HOSPITAL_COMMUNITY): Payer: Medicare Other

## 2012-06-11 ENCOUNTER — Inpatient Hospital Stay (HOSPITAL_COMMUNITY): Payer: Medicare Other | Admitting: *Deleted

## 2012-06-11 DIAGNOSIS — A4189 Other specified sepsis: Secondary | ICD-10-CM

## 2012-06-11 DIAGNOSIS — Z5189 Encounter for other specified aftercare: Secondary | ICD-10-CM

## 2012-06-11 DIAGNOSIS — R5381 Other malaise: Secondary | ICD-10-CM

## 2012-06-11 LAB — GLUCOSE, CAPILLARY
Glucose-Capillary: 139 mg/dL — ABNORMAL HIGH (ref 70–99)
Glucose-Capillary: 118 mg/dL — ABNORMAL HIGH (ref 70–99)

## 2012-06-11 MED ORDER — DIPHENOXYLATE-ATROPINE 2.5-0.025 MG PO TABS: 1.0000 | ORAL_TABLET | Freq: Four times a day (QID) | ORAL | Status: DC | PRN

## 2012-06-11 NOTE — Progress Notes (Signed)
Physical Therapy Session Note  Patient Details  Name: [name] MRN: [id number] Date of Birth: 11-04-1923  Today's Date: 06/11/2012 Time: 4098-1191 Time Calculation (min): 60 min  Short Term Goals: Week 1:  PT Short Term Goal 1 (Week 1): Patient will perform bed mobility on flat bed with min A and 50% verbal cues for sequence PT Short Term Goal 2 (Week 1): Patient will perform sit <> stand and stand pivot transfers with min A and 50% verbal cues for sequence PT Short Term Goal 2 - Progress (Week 1): Progressing toward goal PT Short Term Goal 3 (Week 1): Patient will perform w/c mobility on unit for UE strength/endurance with bilat UE propulsion x 150 with min A and 2 rest breaks PT Short Term Goal 4 (Week 1): Patient will perform gait training with 4WW x 50' with min A with improved upright trunk and step and stride length bilaterally PT Short Term Goal 5 (Week 1): Patient will perform up and down 4 steps with R rail and mod A for home entry/exit training    Skilled Therapeutic Interventions/Progress Updates:   Therapeutic exercise performed with LE to increase strength for functional mobility: 1 x 10 R and L alternating knee extension with isometric contraction at end range; hip flexion/extension, ankle pumps; bil shoulder adduction to facilitate upright posture.  Gait training with Rw x 70' with min assist; VCs for upright posture, forward gaze, longer step lengths, reaching back before sitting;  Co-treat with Therapeutic Rec for skilled tx for dynamic standing balance.  In standing, reaching with either hand for horseshoe, placing overhead, for wt shifting, full trunk and hip extension, dynamic balance, with opposite hand supported on RW. O2 sats after activity = 98%.  Gait x 70' with intermittent kicking of beach ball for additional wt shift in standing; mild LOB recovered Ind'ly during first kick only, demonstrating increased increased fluidity and wt shifting with repeated  kicks, alternating feet.       Therapy Documentation Precautions:  Precautions Precautions: Fall Precaution Comments: keep O2 sats > 90% Restrictions Weight Bearing Restrictions: No   Vital Signs: Therapy Vitals Temp: 98.2 F (36.8 C) Temp src: Oral Pulse Rate: 75  Resp: 18  BP: 106/46 mmHg Patient Position, if appropriate: Lying Oxygen Therapy SpO2: 98 % O2 Device: Nasal cannula O2 Flow Rate (L/min): 2 L/min Pain: Pain Assessment Pain Assessment: No/denies pain        See FIM for current functional status  Therapy/Group: Individual Therapy  Eugene Zeiders 06/11/2012, 4:46 PM

## 2012-06-11 NOTE — Progress Notes (Signed)
Patient tolerates therapies well. No new complaints today. Has shortness of breath with activity, O2@ at 2 l/m. Rash to bilateral groin with nystatin powder applied. Continues to have multiple loose stools today, small amounts. Offered imodium, but patient refused. Family brought in personal walker for therapies. Continue plan of care.

## 2012-06-11 NOTE — Progress Notes (Signed)
Occupational Therapy Session Note  Patient Details  Name: [name] MRN: [id number] Date of Birth: May 10, 1924  Today's Date: 06/11/2012 Time: 0900-1000 and 7829-5621 Time Calculation (min): 60 min and 30 min  Short Term Goals: Week 1:  OT Short Term Goal 1 (Week 1): Pt will complete sit to stand at toilet with min assist. OT Short Term Goal 2 (Week 1): Pt will be able to cleanse self on toilet. OT Short Term Goal 3 (Week 1): Pt will be able to bathe with min assist. OT Short Term Goal 4 (Week 1): Pt will transfer to toilet with mod assist x 1. OT Short Term Goal 5 (Week 1): Pt will transfer to shower seat with mod assist x 1.  Skilled Therapeutic Interventions/Progress Updates:    1) Pt seen for ADL retraining with focus on increased independence with bathing and dressing.  Upon arrival, pt reports having BM in brief, completed hygiene at bed level with rolling side to side.  Pt required assist to don pullup on both legs, but demonstrated ability to pull it up over hips.  Pt completed UB bathing and dressing at sink with setup assist and assist to hook bra.  Pt reported needing to have another BM, stand pivot transfer to toilet with mod assist and assist to pull down pants secondary to urgency.  Pt with multiple loose BMs.  Pt required assist with donning pullup and pants over feet, but was able to pull both up in standing with steady assist.  Pt completed grooming at sink with setup assist.  2) 1:1 OT with focus on transfers and dynamic sitting balance.  Upon entering, pt reports needing to toilet.  Focus on stand pivot transfer with min/steady assist and use of grab bar for steady assist.  Pt completed clothing management and hygiene with steady assist at hips in standing.  Pt required increased time to manage clothing pre and post toileting.  Engaged in dynamic reaching activity to challenge dynamic sitting balance to prepare for LB bathing and dressing.  Pt reports slight dizziness  when leaning forward.  Doffed shoes and donned gripper socks with increased time, but no physical assist.   Therapy Documentation Precautions:  Precautions Precautions: Fall Precaution Comments: keep O2 sats > 90% Restrictions Weight Bearing Restrictions: No General:   Vital Signs: Therapy Vitals Pulse Rate: 76  Oxygen Therapy SpO2: 96 % O2 Device: Nasal cannula O2 Flow Rate (L/min): 2 L/min Pain: Pain Assessment Pain Assessment: No/denies pain  See FIM for current functional status  Therapy/Group: Individual Therapy  Leonette Monarch 06/11/2012, 12:03 PM

## 2012-06-11 NOTE — Progress Notes (Signed)
Physical Therapy Session Note  Patient Details  Name: [name] MRN: [id number] Date of Birth: 04-Nov-1923  Today's Date: 06/11/2012 Time: 1345-1430 Time Calculation (min): 45 min  Short Term Goals: Week 1:  PT Short Term Goal 1 (Week 1): Patient will perform bed mobility on flat bed with min A and 50% verbal cues for sequence PT Short Term Goal 2 (Week 1): Patient will perform sit <> stand and stand pivot transfers with min A and 50% verbal cues for sequence PT Short Term Goal 2 - Progress (Week 1): Progressing toward goal PT Short Term Goal 3 (Week 1): Patient will perform w/c mobility on unit for UE strength/endurance with bilat UE propulsion x 150 with min A and 2 rest breaks PT Short Term Goal 4 (Week 1): Patient will perform gait training with 4WW x 50' with min A with improved upright trunk and step and stride length bilaterally PT Short Term Goal 5 (Week 1): Patient will perform up and down 4 steps with R rail and mod A for home entry/exit training  Skilled Therapeutic Interventions/Progress Updates:    [place] A exercise program for LE strength to aid mobility and fall prevention. Frequent rest breaks required d/t decreased activity tolerance and decreased muscle endurance in LE's. Use 2 # weights. 10 reps each except 8 sit to stands with BUE and required BUE for support on counter for balance.  Sidestepping, tandem stance (unable to get R foot on ground when it was in front) Daughter observed session.  Therapy Documentation Precautions:  Precautions Precautions: Fall Precaution Comments: keep O2 sats > 90% Restrictions Weight Bearing Restrictions: No Vital Signs: Therapy Vitals Pulse Rate: 90  With activity Oxygen Therapy SpO2: 96 % O2 Device: Nasal cannula O2 Flow Rate (L/min): 2 L/min Pain: Pain Assessment Pain Assessment: No/denies pain See FIM for current functional status  Therapy/Group: Individual Therapy  Michaelene Song 06/11/2012, 2:16 PM

## 2012-06-11 NOTE — Progress Notes (Addendum)
Patient ID: [name], female   DOB: 10-Dec-1923, 78 y.o.   MRN: [id number] Subjective/Complaints: Loose stools, mild LLQ pain, no vomiting A 12 point review of systems has been performed and if not noted above is otherwise negative.   Objective: Vital Signs: Blood pressure 110/62, pulse 76, temperature 97.6 F (36.4 C), temperature source Oral, resp. rate 18, height 5\' 7"  (1.702 m), weight 86.7 kg (191 lb 2.2 oz), SpO2 96.00%. No results found. No results found for this basename: WBC:2,HGB:2,HCT:2,PLT:2 in the last 72 hours  Basename 06/10/12 0730  NA 136  K 3.4*  CL 96  CO2 34*  GLUCOSE 97  BUN 7  CREATININE 0.80  CALCIUM 7.5*   CBG (last 3)   Basename 06/11/12 0718 06/10/12 2031 06/10/12 1624  GLUCAP 110* 102* 191*    Wt Readings from Last 3 Encounters:  06/10/12 86.7 kg (191 lb 2.2 oz)  06/06/12 86.183 kg (190 lb)    Physical Exam:   General: Alert and oriented x 3, No apparent distress HEENT: Head is normocephalic, atraumatic, PERRLA, EOMI, sclera anicteric, oral mucosa pink and moist, dentition intact, ext ear canals clear,  Neck: Supple without JVD or lymphadenopathy Heart: Reg rate and rhythm. No murmurs rubs or gallops Chest: CTA bilaterally without wheezes, rales, or rhonchi; no distress Abdomen: Soft, non-tender, non-distended, bowel sounds positive. Extremities: No clubbing, cyanosis, or edema. Pulses are 2+ Skin: Clean and intact without signs of breakdown Neuro: Pt is cognitively appropriate with normal insight, memory, and awareness. Cranial nerves 2-12 are intact. Sensory exam is normal. Reflexes are 2+ in all 4's. Fine motor coordination is intact. No tremors. Motor function is grossly 5/5.  Musculoskeletal: Full ROM, No pain with AROM or PROM in the neck, trunk, or extremities. Posture appropriate Psych: Pt's affect is appropriate. Pt is cooperative   Assessment/Plan: 1. Functional deficits secondary to deconditioning after urosepsis and  multiple medical complications which require 3+ hours per day of interdisciplinary therapy in a comprehensive inpatient rehab setting. Physiatrist is providing close team supervision and 24 hour management of active medical problems listed below. Physiatrist and rehab team continue to assess barriers to discharge/monitor patient progress toward functional and medical goals. FIM: FIM - Bathing Bathing Steps Patient Completed: Chest;Right Arm;Left Arm;Front perineal area;Buttocks Bathing: 3: Mod-Patient completes 5-7 62f 10 parts or 50-74%  FIM - Upper Body Dressing/Undressing Upper body dressing/undressing steps patient completed: Thread/unthread right bra strap;Thread/unthread left bra strap;Thread/unthread right sleeve of pullover shirt/dresss;Thread/unthread left sleeve of pullover shirt/dress;Put head through opening of pull over shirt/dress;Pull shirt over trunk Upper body dressing/undressing: 4: Min-Patient completed 75 plus % of tasks FIM - Lower Body Dressing/Undressing Lower body dressing/undressing steps patient completed: Pull underwear up/down;Pull pants up/down Lower body dressing/undressing: 1: Total-Patient completed less than 25% of tasks  FIM - Toileting Toileting steps completed by patient: Performs perineal hygiene Toileting Assistive Devices: Grab bar or rail for support Toileting: 2: Max-Patient completed 1 of 3 steps  FIM - Diplomatic Services operational officer Devices: Grab bars Toilet Transfers: 3-To toilet/BSC: Mod A (lift or lower assist);3-From toilet/BSC: Mod A (lift or lower assist)  FIM - Press photographer Assistive Devices: HOB elevated Bed/Chair Transfer: 5: Supine > Sit: Supervision (verbal cues/safety issues);3: Bed > Chair or W/C: Mod A (lift or lower assist)  FIM - Locomotion: Wheelchair Distance: 53' with mod-max A for hand placement and UE propulsion sequence to maintain straight navigation and for changing direction; patient  tends to propel with very short strokes.  Locomotion: Wheelchair: 0: Activity did not occur FIM - Locomotion: Ambulation Locomotion: Ambulation Assistive Devices: Designer, industrial/product Ambulation/Gait Assistance: 4: Min guard Locomotion: Ambulation: 2: Travels 50 - 149 ft with minimal assistance (Pt.>75%)  Comprehension Comprehension Mode: Auditory Comprehension: 5-Understands complex 90% of the time/Cues < 10% of the time  Expression Expression Mode: Verbal Expression: 5-Expresses basic needs/ideas: With no assist  Social Interaction Social Interaction: 6-Interacts appropriately with others with medication or extra time (anti-anxiety, antidepressant).  Problem Solving Problem Solving: 5-Solves basic problems: With no assist  Memory Memory: 5-Recognizes or recalls 90% of the time/requires cueing < 10% of the time  Medical Problem List and Plan:  1. DVT Prophylaxis/Anticoagulation: Pharmaceutical: Pradexa  2. Pain Management: N/A. Continue neurontin for diabetic neuropathy.  3. Mood: alert and appropriate. Will monitor for now.  4. Neuropsych: This patient is capable of making decisions on his/her own behalf.  5. Acute on chronic CHF: daily weights. Low salt diet. Monitor for signs of fluid overload.  6. Atrial Fibrillation: Monitor HR with bid checks. Continue amiodarone, cardizem and pradaxa. Monitor H/H closely.  7. Hypothyroid: On supplement.  8. E. Coli UTI: continue rocephin thru 8/13 9. DM type 2 with peripheral neuropathy: monitor BS with AC/HS cbg checks. Glucotrol on hold currently. Monitor po intake and will resume if BS on upward trend.  10. Diarrhea: Continue probiotic. C-diff negative low dose immodium 11. ABLA: H/H has varied from 10.5-12.4 range. Will monitor especially with pradaxa resumed. 12. Insomnia: D/C ambien. Family to provide home RX sonata which is very effective  LOS (Days) 5 A FACE TO FACE EVALUATION WAS PERFORMED  KIRSTEINS,ANDREW E 06/11/2012, 8:50 AM

## 2012-06-12 ENCOUNTER — Inpatient Hospital Stay (HOSPITAL_COMMUNITY): Payer: Medicare Other | Admitting: Physical Therapy

## 2012-06-12 ENCOUNTER — Inpatient Hospital Stay (HOSPITAL_COMMUNITY): Payer: Medicare Other

## 2012-06-12 LAB — BASIC METABOLIC PANEL
Chloride: 94 mEq/L — ABNORMAL LOW (ref 96–112)
Creatinine, Ser: 0.85 mg/dL (ref 0.50–1.10)
GFR calc Af Amer: 70 mL/min — ABNORMAL LOW (ref >90)
GFR calc non Af Amer: 61 mL/min — ABNORMAL LOW (ref >90)
Potassium: 3.3 mEq/L — ABNORMAL LOW (ref 3.5–5.1)

## 2012-06-12 LAB — GLUCOSE, CAPILLARY
Glucose-Capillary: 150 mg/dL — ABNORMAL HIGH (ref 70–99)
Glucose-Capillary: 131 mg/dL — ABNORMAL HIGH (ref 70–99)
Glucose-Capillary: 120 mg/dL — ABNORMAL HIGH (ref 70–99)

## 2012-06-12 MED ORDER — POTASSIUM CHLORIDE CRYS ER 20 MEQ PO TBCR
20.0000 meq | EXTENDED_RELEASE_TABLET | Freq: Two times a day (BID) | ORAL | Status: DC
  Administered 2012-06-12 – 2012-06-20 (×16): 20 meq via ORAL
  Filled 2012-06-12 (×20): qty 1

## 2012-06-12 NOTE — Progress Notes (Signed)
Physical Therapy Note  Patient Details  Name: ESTERLENE ATIYEH MRN: [id number] Date of Birth: [birth year] Today's Date: 06/12/2012  11:20-11:55 individual therapy pt denied pain  performed endurance with gait walking with rollator walker for a total of 400' with 4 seated rest on walker minguard assist. performed gait around obstacle on carpet. Car transfer with supervision with vc to hold car frame to assist with getting leg in the car.    [name] 06/12/2012, 12:05 PM

## 2012-06-12 NOTE — Progress Notes (Signed)
Patient ID: [name], female   DOB: Jan 03, 1924, 78 y.o.   MRN: [id number] Subjective/Complaints: Loose stools, mild LLQ pain, no vomiting A 12 point review of systems has been performed and if not noted above is otherwise negative.   Objective: Vital Signs: Blood pressure 116/60, pulse 95, temperature 98.5 F (36.9 C), temperature source Oral, resp. rate 18, height 5\' 7"  (1.702 m), weight 79.379 kg (175 lb), SpO2 93.00%. No results found. No results found for this basename: WBC:2,HGB:2,HCT:2,PLT:2 in the last 72 hours  Basename 06/12/12 0655 06/10/12 0730  NA 134* 136  K 3.3* 3.4*  CL 94* 96  CO2 33* 34*  GLUCOSE 100* 97  BUN 8 7  CREATININE 0.85 0.80  CALCIUM 7.5* 7.5*   CBG (last 3)   Basename 06/12/12 0718 06/11/12 2122 06/11/12 1149  GLUCAP 105* 118* 139*    Wt Readings from Last 3 Encounters:  06/12/12 79.379 kg (175 lb)  06/06/12 86.183 kg (190 lb)    Physical Exam:   General: Alert and oriented x 3, No apparent distress HEENT: Head is normocephalic, atraumatic, PERRLA, EOMI, sclera anicteric, oral mucosa pink and moist, dentition intact, ext ear canals clear,  Neck: Supple without JVD or lymphadenopathy Heart: Reg rate and rhythm. No murmurs rubs or gallops Chest: CTA bilaterally without wheezes, rales, or rhonchi; no distress Abdomen: Soft, non-tender, non-distended, bowel sounds positive. Extremities: No clubbing, cyanosis, or edema. Pulses are 2+ Skin: Clean and intact without signs of breakdown Neuro: Pt is cognitively appropriate with normal insight, memory, and awareness. Cranial nerves 2-12 are intact. Sensory exam is normal. Reflexes are 2+ in all 4's. Fine motor coordination is intact. No tremors. Motor function is grossly 5/5.  Musculoskeletal: Full ROM, No pain with AROM or PROM in the neck, trunk, or extremities. Posture appropriate Psych: Pt's affect is appropriate. Pt is cooperative   Assessment/Plan: 1. Functional deficits secondary to  deconditioning after urosepsis and multiple medical complications which require 3+ hours per day of interdisciplinary therapy in a comprehensive inpatient rehab setting. Physiatrist is providing close team supervision and 24 hour management of active medical problems listed below. Physiatrist and rehab team continue to assess barriers to discharge/monitor patient progress toward functional and medical goals. FIM: FIM - Bathing Bathing Steps Patient Completed: Chest;Right Arm;Left Arm;Abdomen;Right upper leg;Left upper leg Bathing: 3: Mod-Patient completes 5-7 26f 10 parts or 50-74%  FIM - Upper Body Dressing/Undressing Upper body dressing/undressing steps patient completed: Thread/unthread right bra strap;Thread/unthread left bra strap;Thread/unthread right sleeve of pullover shirt/dresss;Thread/unthread left sleeve of pullover shirt/dress;Put head through opening of pull over shirt/dress;Pull shirt over trunk Upper body dressing/undressing: 4: Min-Patient completed 75 plus % of tasks FIM - Lower Body Dressing/Undressing Lower body dressing/undressing steps patient completed: Pull underwear up/down;Pull pants up/down Lower body dressing/undressing: 2: Max-Patient completed 25-49% of tasks  FIM - Toileting Toileting steps completed by patient: Performs perineal hygiene;Adjust clothing after toileting Toileting Assistive Devices: Grab bar or rail for support Toileting: 3: Mod-Patient completed 2 of 3 steps  FIM - Diplomatic Services operational officer Devices: Grab bars Toilet Transfers: 3-To toilet/BSC: Mod A (lift or lower assist);3-From toilet/BSC: Mod A (lift or lower assist)  FIM - Bed/Chair Transfer Bed/Chair Transfer Assistive Devices: Bed rails;Arm rests Bed/Chair Transfer: 0: Activity did not occur  FIM - Locomotion: Wheelchair Distance: 37' with mod-max A for hand placement and UE propulsion sequence to maintain straight navigation and for changing direction; patient tends to  propel with very short strokes.   Locomotion: Wheelchair: 0:  Activity did not occur FIM - Locomotion: Ambulation Locomotion: Ambulation Assistive Devices: Walker - Rolling Ambulation/Gait Assistance: 4: Min guard Locomotion: Ambulation: 2: Travels 50 - 149 ft with minimal assistance (Pt.>75%)  Comprehension Comprehension Mode: Auditory Comprehension: 5-Understands basic 90% of the time/requires cueing < 10% of the time  Expression Expression Mode: Verbal Expression: 5-Expresses complex 90% of the time/cues < 10% of the time  Social Interaction Social Interaction: 6-Interacts appropriately with others with medication or extra time (anti-anxiety, antidepressant).  Problem Solving Problem Solving: 5-Solves basic problems: With no assist  Memory Memory: 5-Recognizes or recalls 90% of the time/requires cueing < 10% of the time  Medical Problem List and Plan:  1. DVT Prophylaxis/Anticoagulation: Pharmaceutical: Pradexa  2. Pain Management: N/A. Continue neurontin for diabetic neuropathy.  3. Mood: alert and appropriate. Will monitor for now.  4. Neuropsych: This patient is capable of making decisions on his/her own behalf.  5. Acute on chronic CHF: daily weights. Low salt diet. Monitor for signs of fluid overload.  6. Atrial Fibrillation: Monitor HR with bid checks. Continue amiodarone, cardizem and pradaxa. Monitor H/H closely.  7. Hypothyroid: On supplement.  8. E. Coli UTI: continue rocephin thru 8/13 9. DM type 2 with peripheral neuropathy: monitor BS with AC/HS cbg checks. Glucotrol on hold currently. Monitor po intake and will resume if BS on upward trend.  10. Diarrhea: Continue probiotic. C-diff negative low dose immodium 11. ABLA: H/H has varied from 10.5-12.4 range. Will monitor especially with pradaxa resumed. 12. Insomnia: D/C ambien. Family to provide home RX sonata which is very effective 12.  Hypokalemia due to lasix +/- diarrhea will increase KCL  LOS (Days) 6 A FACE  TO FACE EVALUATION WAS PERFORMED  Sky Borboa E 06/12/2012, 9:15 AM

## 2012-06-12 NOTE — Progress Notes (Signed)
Occupational Therapy Session Note  Patient Details  Name: [name] MRN: [id number] Date of Birth: 1924/10/20  Today's Date: 06/12/2012 Time: 0730-0830 Time Calculation (min): 60 min  Short Term Goals: Week 1:  OT Short Term Goal 1 (Week 1): Pt will complete sit to stand at toilet with min assist. OT Short Term Goal 2 (Week 1): Pt will be able to cleanse self on toilet. OT Short Term Goal 3 (Week 1): Pt will be able to bathe with min assist. OT Short Term Goal 4 (Week 1): Pt will transfer to toilet with mod assist x 1. OT Short Term Goal 5 (Week 1): Pt will transfer to shower seat with mod assist x 1.  Skilled Therapeutic Interventions/Progress Updates:  ADL re-training session this AM at sink. Session with focus on functional transfers, ADL performance, dynamic sitting balance, standing balance. Patient needed assist with hooking bra, donning shoes, and tying shoes. Patient would benefit from long-handled sponge and elastic shoelaces to increase functional performance during bathing and dressing. Patient educated on compensatory breathing and energy conservation techniques during ADL. vc's for techniques to use during LB dressing to increase functional performance and decrease difficulty level.   Therapy Documentation Precautions:  Precautions Precautions: Fall Precaution Comments: keep O2 sats > 90% Restrictions Weight Bearing Restrictions: No Pain: Pain Assessment Pain Assessment: No/denies pain  See FIM for current functional status  Therapy/Group: Individual Therapy  Limmie Patricia, OTR/L 06/12/2012, 9:35 AM

## 2012-06-12 NOTE — Progress Notes (Signed)
Physical Therapy Note  Patient Details  Name: [name] MRN: [id number] Date of Birth: 10-Jul-1924 Today's Date: 06/12/2012  8:30 -9:30 individual therapy pt denied pain.  Pt was in the bathroom upon arrival. Pt performed toileting task with grab bar and minguard assist. Reviewed and performed HEP for otaga exercises, and pt performed each x 10 each with 1 seated rest with 3lb weight.. O2 on RA remained 96% and HR fluctuated to 115 and below. performed gait with rollator walker from home minguard assist x 150 with 1 seated rest and 1 small LOB. Pt used brakes safely with 1 vc to get feet squared up before unlocking brake, min assist needed to balance with standing.  [name] 06/12/2012, 9:06 AM

## 2012-06-12 NOTE — Progress Notes (Signed)
Physical Therapy Session Note  Patient Details  Name: [name] MRN: [id number] Date of Birth: 1923/12/28  Today's Date: 06/12/2012 Time:  -     Short Term Goals: Week 1:  PT Short Term Goal 1 (Week 1): Patient will perform bed mobility on flat bed with min A and 50% verbal cues for sequence PT Short Term Goal 1 - Progress (Week 1): Met PT Short Term Goal 2 (Week 1): Patient will perform sit <> stand and stand pivot transfers with min A and 50% verbal cues for sequence PT Short Term Goal 2 - Progress (Week 1): Met PT Short Term Goal 3 (Week 1): Patient will perform w/c mobility on unit for UE strength/endurance with bilat UE propulsion x 150 with min A and 2 rest breaks PT Short Term Goal 4 (Week 1): Patient will perform gait training with 4WW x 50' with min A with improved upright trunk and step and stride length bilaterally PT Short Term Goal 4 - Progress (Week 1): Met PT Short Term Goal 5 (Week 1): Patient will perform up and down 4 steps with R rail and mod A for home entry/exit training PT Short Term Goal 5 - Progress (Week 1): Met  Skilled Therapeutic Interventions/Progress Updates:      Therapy Documentation Precautions:  Precautions Precautions: Fall Precaution Comments: keep O2 sats > 90% Restrictions Weight Bearing Restrictions: No General:   Vital Signs: Therapy Vitals Temp: 97.9 F (36.6 C) Temp src: Oral Pulse Rate: 103  Resp: 20  BP: 133/78 mmHg Patient Position, if appropriate: Sitting Oxygen Therapy SpO2: 97 % O2 Device: None (Room air) Pain:pt denied pain   Mobility: performed bed mobility supervision with cues to bend knees to help move hips in bed x 2. Pt did not have problems motor planning but was slow with all mobility.   Locomotion : performed gait 2 x 150' with rollator walker with 1 seated rest each way. Once pt really fatigued she started to forget to lock the brakes.   performed 8 steps x 2 with 2 rails, attempted to have pt use  single rail on left to simulate home but her legs were too fatigued to perform safely on 8" steps, she could ascend 4'' steps with 1 rail with vc to hold rail with both hands. Performed ramp and curb with rollator walker minguard assist without cues. Pt demonstrated proper technique without assistance or cues.    Balance: Standardized Balance Assessment Standardized Balance Assessment: Timed Up and Go Test Timed Up and Go Test TUG: Normal TUG Normal TUG (seconds): 50     Other Treatments:    See FIM for current functional status  Therapy/Group: Individual Therapy  [name] 06/12/2012, 4:16 PM

## 2012-06-13 ENCOUNTER — Inpatient Hospital Stay (HOSPITAL_COMMUNITY): Payer: Medicare Other | Admitting: Physical Therapy

## 2012-06-13 ENCOUNTER — Inpatient Hospital Stay (HOSPITAL_COMMUNITY): Payer: Medicare Other | Admitting: Occupational Therapy

## 2012-06-13 ENCOUNTER — Inpatient Hospital Stay (HOSPITAL_COMMUNITY): Payer: Medicare Other

## 2012-06-13 DIAGNOSIS — A4189 Other specified sepsis: Secondary | ICD-10-CM

## 2012-06-13 DIAGNOSIS — Z5189 Encounter for other specified aftercare: Secondary | ICD-10-CM

## 2012-06-13 DIAGNOSIS — R5381 Other malaise: Secondary | ICD-10-CM

## 2012-06-13 LAB — BASIC METABOLIC PANEL
CO2: 30 mEq/L (ref 19–32)
Chloride: 96 mEq/L (ref 96–112)
GFR calc Af Amer: 69 mL/min — ABNORMAL LOW (ref >90)
Potassium: 3.3 mEq/L — ABNORMAL LOW (ref 3.5–5.1)

## 2012-06-13 LAB — CBC
Platelets: 310 10*3/uL (ref 150–400)
RBC: 3.8 MIL/uL — ABNORMAL LOW (ref 3.87–5.11)
WBC: 10.5 10*3/uL (ref 4.0–10.5)

## 2012-06-13 LAB — GLUCOSE, CAPILLARY
Glucose-Capillary: 182 mg/dL — ABNORMAL HIGH (ref 70–99)
Glucose-Capillary: 93 mg/dL (ref 70–99)
Glucose-Capillary: 106 mg/dL — ABNORMAL HIGH (ref 70–99)

## 2012-06-13 LAB — OCCULT BLOOD X 1 CARD TO LAB, STOOL: Fecal Occult Bld: POSITIVE

## 2012-06-13 NOTE — Progress Notes (Signed)
Physical Therapy Session Note  Patient Details  Name: [name] MRN: [id number] Date of Birth: 02/04/1924  Today's Date: 06/13/2012 Time: 4098-1191 Time Calculation (min): 36 min  Skilled Therapeutic Interventions/Progress Updates:   Patient very fatigued but agreed to shortened session.  Patient performed bed mobility supine <> sit and repositioning with bridging in bed at end of session on R side for pressure relief with min-mod A with UE on rail; patient reports she uses UE on RW beside the bed to assist with bed mobility at home.  Patient reports needing to use toilet; transfer bed > toilet with RW and supervision-min A over changes in floor surfaces but able to perform sit <> stand from bed or toilet with UE support and supervision.  Patient noted to have blood in stool; RN alerted.  Required assistance in standing with grab bar support for hygiene and donning of clothing and clean brief (max A).  Patient reports being very shaky and fatigued after BM.    Performed [place] standing HEP with bilat UE support on sink x 10 reps each LE, each exercise for bilat LE strengthening and balance training with min-mod A for upright trunk and correct muscle activation.  Patient required 2 sitting rest breaks secondary to fatigue.  Returned to bed after exercises.  Therapy Documentation Precautions:  Precautions Precautions: Fall Precaution Comments: diarrhea Restrictions Weight Bearing Restrictions: No General: Amount of Missed PT Time (min): 11 Minutes Missed Time Reason: Patient fatigue Vital Signs: Therapy Vitals Temp: 98 F (36.7 C) Temp src: Oral Pulse Rate: 86  Resp: 18  BP: 108/65 mmHg Patient Position, if appropriate: Lying Oxygen Therapy SpO2: 95 % O2 Device: None (Room air) Pain:  No c/o pain but did c/o significant fatigue after full day of therapy and not resting well last night.  See FIM for current functional status  Therapy/Group: Individual Therapy  Edman Circle [facility] 06/13/2012, 4:46 PM

## 2012-06-13 NOTE — Patient Care Conference (Signed)
Inpatient RehabilitationTeam Conference Note Date: 05/30/2012   Time: 11:05 AM    Patient Name: [name]      Medical Record Number: [id number]  Date of Birth: 1924/01/12 Sex: Female         Room/Bed: 4029/4029-01 Payor Info: Payor: MEDICARE  Plan: MEDICARE PART A AND B  Product Type: *No Product type*     Admitting Diagnosis: DECONDITIONED  Admit Date/Time:  06/06/2012  5:57 PM Admission Comments: No comment available   Primary Diagnosis:  Physical deconditioning Principal Problem: Physical deconditioning  Patient Active Problem List   Diagnosis Date Noted  . Physical deconditioning 06/06/2012  . Bloody diarrhea 06/04/2012  . Acute respiratory failure with hypoxia 06/04/2012  . Hypotension 06/04/2012  . OSA (obstructive sleep apnea) 06/04/2012  . Pulmonary edema 06/04/2012  . Leukocytosis 06/04/2012  . Sepsis 05/31/2012  . Ischemic colitis 05/31/2012  . C. difficile colitis 05/31/2012  . Chest pain 05/29/2012  . Vomiting 05/29/2012  . Constipation 05/29/2012  . Hypokalemia 05/29/2012  . Hyponatremia 05/29/2012  . E. coli UTI 05/29/2012  . Atrial fibrillation with RVR   . Diastolic heart failure, NYHA class 1   . Hypothyroidism   . Asthma     Expected Discharge Date: Expected Discharge Date: 06/19/12  Team Members Present: Physician: Dr. Claudette Laws Social Worker Present: Amada Jupiter, LCSW Nurse Present: Other (comment) Berta Minor,  RN) PT Present: Vincente Liberty, PTA OT Present: Leonette Monarch, OT SLP Present: Feliberto Gottron, SLP Other (Discipline and Name): Tora Duck,  PPS Coordinator     Current Status/Progress Goal Weekly Team Focus  Medical   occasional diarrhea, continent of urine, tolerating therapy  reduce loose stools  trial Lomotil, C. difficile negative may need to retest   Bowel/Bladder   continent toilet every 2-3hours incontient of bowel loose bm's  wears depends  manage bowel and bladder min assist  toilet every 3 hours      Swallow/Nutrition/ Hydration             ADL's   setup UB bathing, min assist UB dressing, max assist LB bathing and dressing, min-mod assist stand pivot transfers  supervision overall  activity tolerance/endurance, transfers, dynamic sitting and standing balance   Mobility   min assist basic transfer; min assist ambulation x 75' with RW  supervision basic transfer, min assist car; supervision x 150' w/c in controlled env; supervision ambulation x 100' controlled env, 50' home env  increased activity tolerance, functional mobility, strengthening   Communication             Safety/Cognition/ Behavioral Observations  decrease safety awareness bed alarm   mod independent  enourage call bell   Pain   pain in right foot allopurinal   less than 3  monitor increase in pain   Skin   periarea red   no new breakdown   barrier cream to buttocks and periarea prn      *See Interdisciplinary Assessment and Plan and progress notes for long and short-term goals  Barriers to Discharge: need to identify 24 7 caregiver    Possible Resolutions to Barriers:  social work to address    Discharge Planning/Teaching Needs:    home with family who provide 24/7 private duty care via [facility] agency     Team Discussion:  Having some loose stools but Cdiff culture neg.  Making nice gains overall and expected to reach supervision to minimal assistance goals  Revisions to Treatment Plan:  None   Continued Need  for Acute Rehabilitation Level of Care: The patient requires daily medical management by a physician with specialized training in physical medicine and rehabilitation for the following conditions: Daily direction of a multidisciplinary physical rehabilitation program to ensure safe treatment while eliciting the highest outcome that is of practical value to the patient.: Yes Daily medical management of patient stability for increased activity during participation in an intensive rehabilitation  regime.: Yes Daily analysis of laboratory values and/or radiology reports with any subsequent need for medication adjustment of medical intervention for : Other  Amada Jupiter 06/13/2012, 4:34 PM

## 2012-06-13 NOTE — Progress Notes (Signed)
Social Work Patient ID: [name], female   DOB: Mar 15, 1924, 78 y.o.   MRN: [id number]  Met with patient's daughter and son-in-law today to review team conference.  Both aware and agreeable with targeted d/c date of 8/21 at supervision to minimal assistance goals.  Daughter confirms that [employer] private duty care will be scheduled to restart on the d/c date.  Requesting Genevieve Norlander for [facility] services.   Zyon Rosser

## 2012-06-13 NOTE — Progress Notes (Addendum)
Occupational Therapy Weekly Progress Note and Treatment Note  Patient Details  Name: UNIQUE SILLAS MRN: [id number] Date of Birth: [birth year]  Today's Date: 06/13/2012 Time: 1030-1130 and 1430-1455 Time Calculation (min): 60 min and 25 min  Patient has met 4 of 5 short term goals.  Pt is making steady progress towards goals.  Pt has progressed from max-total assist with self-care tasks to min assist overall with bathing, dressing, and toileting/toilet transfers.  Pt has progressed from wearing 2L O2 consistently to maintaining O2 >95% without O2 during the day with all self-care tasks.  Pt was receiving 24/7 supervision from hired caregivers PTA and is progressing towards supervision goals.    Patient continues to demonstrate the following deficits: decreased endurance/activity tolerance, decreased standing endurance, decreased dynamic sitting and standing balance and therefore will continue to benefit from skilled OT intervention to enhance overall performance with BADL and Reduce care partner burden.  Patient progressing toward long term goals..  Continue plan of care.  OT Short Term Goals Week 1:  OT Short Term Goal 1 (Week 1): Pt will complete sit to stand at toilet with min assist. OT Short Term Goal 1 - Progress (Week 1): Met OT Short Term Goal 2 (Week 1): Pt will be able to cleanse self on toilet. OT Short Term Goal 2 - Progress (Week 1): Met OT Short Term Goal 3 (Week 1): Pt will be able to bathe with min assist. OT Short Term Goal 3 - Progress (Week 1): Met OT Short Term Goal 4 (Week 1): Pt will transfer to toilet with mod assist x 1. OT Short Term Goal 4 - Progress (Week 1): Met OT Short Term Goal 5 (Week 1): Pt will transfer to shower seat with mod assist x 1. OT Short Term Goal 5 - Progress (Week 1): Progressing toward goal Week 2:  OT Short Term Goal 1 (Week 2): STG = LTGs due to remaining short LOS.  Supervision level overall  Skilled Therapeutic Interventions/Progress  Updates:    1) Pt seen for ADL retraining at sink per pt's request.  Pt completed UB bathing and dressing with supervision/setup with exception of requiring assist to hook bra.  Pt demonstrated increased ability to reach BLE to complete LB bathing and dressing.  Issued pt long handled sponge to assist with LB bathing and use of elastic shoelaces to improve independence with donning shoes.  Pt required 2-3 rest breaks during session with O2 > 95% throughout activity.  Engaged in BUE strengthening activities with theraband exercises.  2) Pt resting in bed upon arrival.  Hesitant to participate, but agreeable to get OOB.  Ambulated with min/steady assist with Rolator walker with 3 rest breaks while ambulating around unit, approx 150 feet.  Pt reporting need to toilet.  Ambulated to toilet with Rolator walker and min/steady assist.  Pt completed toileting (hygiene and clothing management) with steady assist.  Pt requesting to return to bed, min assist stand pivot to bed.  Therapy Documentation Precautions:  Precautions Precautions: Fall Precaution Comments: diarrhea Restrictions Weight Bearing Restrictions: No General:   Vital Signs: Therapy Vitals BP: 119/69 mmHg Pain: Pain Assessment Pain Assessment: No/denies pain Pain Score: 0-No pain Pain Location: Head Pain Intervention(s): RN made aware ADL: ADL Equipment Provided: Long-handled sponge (elastic shoelaces) Grooming: Setup Where Assessed-Grooming: Sitting at sink Upper Body Bathing: Setup Where Assessed-Upper Body Bathing: Sitting at sink Lower Body Bathing: Minimal assistance Where Assessed-Lower Body Bathing: Sitting at sink;Standing at sink Upper Body Dressing: Minimal assistance Where Assessed-Upper  Body Dressing: Sitting at sink Lower Body Dressing: Minimal assistance Where Assessed-Lower Body Dressing: Sitting at sink Toileting: Minimal assistance Where Assessed-Toileting: Teacher, adult education: Company secretary Method: Surveyor, minerals: Grab bars  See FIM for current functional status  Therapy/Group: Individual Therapy  Leonette Monarch 06/13/2012, 12:36 PM

## 2012-06-13 NOTE — Progress Notes (Signed)
Physical Therapy Session Note  Patient Details  Name: [name] MRN: [id number] Date of Birth: 01/14/1924  Today's Date: 06/13/2012 Time: 0805-0903 Time Calculation (min): 58 min  Short Term Goals: Week 1:  PT Short Term Goal 1 (Week 1): Patient will perform bed mobility on flat bed with min A and 50% verbal cues for sequence PT Short Term Goal 1 - Progress (Week 1): Met PT Short Term Goal 2 (Week 1): Patient will perform sit <> stand and stand pivot transfers with min A and 50% verbal cues for sequence PT Short Term Goal 2 - Progress (Week 1): Met PT Short Term Goal 3 (Week 1): Patient will perform w/c mobility on unit for UE strength/endurance with bilat UE propulsion x 150 with min A and 2 rest breaks PT Short Term Goal 4 (Week 1): Patient will perform gait training with 4WW x 50' with min A with improved upright trunk and step and stride length bilaterally PT Short Term Goal 4 - Progress (Week 1): Met PT Short Term Goal 5 (Week 1): Patient will perform up and down 4 steps with R rail and mod A for home entry/exit training PT Short Term Goal 5 - Progress (Week 1): Met Week 2:     Skilled Therapeutic Interventions/Progress Updates:    Pt requested use of toilet for BM.  Bed mobility in flat bed without rail, min assist L sidelying to sit, 1 VC and 1 manual cue.  Pt incontinent of bowel, diarrhea worse when attempting to stand to ambulate with rollator to BR.    Transfer bed> w/c> toilet with min assist.  Multiple sit> stand from toilet for hygiene with use of wall rail, min assist.  Pt reported feeling dizzy; improved with rest breaks.    Stood x 2 minutes x 2 for perineal cleaning with total assist.  Stood at sink for oral hygiene x 3 minutes. Pt SOB after BM and standing. Pt informed MD that diarrhea appeared bloody.  Gait training with person rollator walker x 60' in hall including 1 turn, with min assist to close supervision, cues for hip flexion to decrease shuffling,  upright posture, proper use of walker brakes, and  forward gaze to decrease fall risk.  Gait deviations improved briefly after each cue, but pt required max cueing to sustain improvements.  L brake of pt's rollator walker is loose; will contact SW for info on repair; pt states company she bought it from is out of business.      Therapy Documentation Precautions:  Precautions Precautions: Fall Precaution Comments: diarrhea Restrictions Weight Bearing Restrictions: No   Pain: Pain Assessment Pain Score:   5 Pain Location: Head Pain Intervention(s): RN made aware     See FIM for current functional status  Therapy/Group: Individual Therapy  Carmichael Burdette 06/13/2012, 9:21 AM

## 2012-06-13 NOTE — Progress Notes (Signed)
Social Work Patient ID: [name], female   DOB: [birth year], 78 y.o.   MRN: [id number]  Contacted by pt's daughter this afternoon who reports that family is unable to get pt home on targeted d/c date of 8/21 due to important prior commitments (planned surgery). Spoke with therapies who report their preference is to extend pt to and 8/22 d/c date given current functional deficits.  MD aware and agreeable.  D/c date changed and family agreeable.  Rorey Bisson

## 2012-06-13 NOTE — Progress Notes (Signed)
Patient ID: [name], female   DOB: September 09, 1924, 78 y.o.   MRN: [id number] Subjective/Complaints: Loose stools, mild LLQ pain, no vomiting A 12 point review of systems has been performed and if not noted above is otherwise negative.   Objective: Vital Signs: Blood pressure 109/72, pulse 83, temperature 97.9 F (36.6 C), temperature source Oral, resp. rate 19, height 5\' 7"  (1.702 m), weight 83.9 kg (184 lb 15.5 oz), SpO2 92.00%. No results found. No results found for this basename: WBC:2,HGB:2,HCT:2,PLT:2 in the last 72 hours  Basename 06/12/12 0655  NA 134*  K 3.3*  CL 94*  CO2 33*  GLUCOSE 100*  BUN 8  CREATININE 0.85  CALCIUM 7.5*   CBG (last 3)   Basename 06/13/12 0729 06/12/12 2128 06/12/12 1632  GLUCAP 108* 120* 131*    Wt Readings from Last 3 Encounters:  06/13/12 83.9 kg (184 lb 15.5 oz)  06/06/12 86.183 kg (190 lb)    Physical Exam:   General: Alert and oriented x 3, No apparent distress HEENT: Head is normocephalic, atraumatic, PERRLA, EOMI, sclera anicteric, oral mucosa pink and moist, dentition intact, ext ear canals clear,  Neck: Supple without JVD or lymphadenopathy Heart: Reg rate and rhythm. No murmurs rubs or gallops Chest: CTA bilaterally without wheezes, rales, or rhonchi; no distress Abdomen: Soft, mild LLQ, non-distended, bowel sounds positive. Extremities: No clubbing, cyanosis, or edema. Pulses are 2+ Skin: Clean and intact without signs of breakdown Neuro: Pt is cognitively appropriate with normal insight, memory, and awareness. Cranial nerves 2-12 are intact. Sensory exam is normal. Reflexes are 2+ in all 4's. Fine motor coordination is intact. No tremors. Motor function is grossly 5/5.  Musculoskeletal: Full ROM, No pain with AROM or PROM in the neck, trunk, or extremities. Posture appropriate Psych: Pt's affect is appropriate. Pt is cooperative   Assessment/Plan: 1. Functional deficits secondary to deconditioning after urosepsis and  multiple medical complications which require 3+ hours per day of interdisciplinary therapy in a comprehensive inpatient rehab setting. Physiatrist is providing close team supervision and 24 hour management of active medical problems listed below. Physiatrist and rehab team continue to assess barriers to discharge/monitor patient progress toward functional and medical goals. FIM: FIM - Bathing Bathing Steps Patient Completed: Chest;Right Arm;Left Arm;Abdomen;Front perineal area;Buttocks;Right upper leg;Left upper leg;Right lower leg (including foot);Left lower leg (including foot) Bathing: 4: Min-Patient completes 8-9 19f 10 parts or 75+ percent  FIM - Upper Body Dressing/Undressing Upper body dressing/undressing steps patient completed: Thread/unthread right bra strap;Thread/unthread left bra strap;Hook/unhook bra;Thread/unthread right sleeve of pullover shirt/dresss;Thread/unthread left sleeve of pullover shirt/dress;Put head through opening of pull over shirt/dress;Pull shirt over trunk Upper body dressing/undressing: 4: Min-Patient completed 75 plus % of tasks FIM - Lower Body Dressing/Undressing Lower body dressing/undressing steps patient completed: Thread/unthread right underwear leg;Thread/unthread left underwear leg;Pull underwear up/down;Thread/unthread right pants leg;Thread/unthread left pants leg;Pull pants up/down;Don/Doff right shoe;Don/Doff left shoe;Fasten/unfasten right shoe;Fasten/unfasten left shoe Lower body dressing/undressing: 3: Mod-Patient completed 50-74% of tasks  FIM - Toileting Toileting steps completed by patient: Performs perineal hygiene;Adjust clothing after toileting Toileting Assistive Devices: Grab bar or rail for support Toileting: 3: Mod-Patient completed 2 of 3 steps  FIM - Diplomatic Services operational officer Devices: Grab bars;Walker Toilet Transfers: 4-To toilet/BSC: Min A (steadying Pt. > 75%);4-From toilet/BSC: Min A (steadying Pt. > 75%)  FIM  - Bed/Chair Transfer Bed/Chair Transfer Assistive Devices: Therapist, occupational: 5: Supine > Sit: Supervision (verbal cues/safety issues);5: Sit > Supine: Supervision (verbal cues/safety issues);4: Bed > Chair  or W/C: Min A (steadying Pt. > 75%);4: Chair or W/C > Bed: Min A (steadying Pt. > 75%)  FIM - Locomotion: Wheelchair Distance: 58' with mod-max A for hand placement and UE propulsion sequence to maintain straight navigation and for changing direction; patient tends to propel with very short strokes.   Locomotion: Wheelchair: 0: Activity did not occur FIM - Locomotion: Ambulation Locomotion: Ambulation Assistive Devices: Designer, industrial/product (rollator) Ambulation/Gait Assistance: 4: Min guard Locomotion: Ambulation: 2: Travels 50 - 149 ft with minimal assistance (Pt.>75%)  Comprehension Comprehension Mode: Auditory Comprehension: 5-Understands complex 90% of the time/Cues < 10% of the time  Expression Expression Mode: Verbal Expression: 5-Expresses complex 90% of the time/cues < 10% of the time  Social Interaction Social Interaction: 6-Interacts appropriately with others with medication or extra time (anti-anxiety, antidepressant).  Problem Solving Problem Solving: 5-Solves basic problems: With no assist  Memory Memory: 5-Recognizes or recalls 90% of the time/requires cueing < 10% of the time  Medical Problem List and Plan:  1. DVT Prophylaxis/Anticoagulation: Pharmaceutical: Pradexa  2. Pain Management: N/A. Continue neurontin for diabetic neuropathy.  3. Mood: alert and appropriate. Will monitor for now.  4. Neuropsych: This patient is capable of making decisions on his/her own behalf.  5. Acute on chronic CHF: daily weights. Low salt diet. Monitor for signs of fluid overload.  6. Atrial Fibrillation: Monitor HR with bid checks. Continue amiodarone, cardizem and pradaxa. Monitor H/H closely.  7. Hypothyroid: On supplement.  8. E. Coli UTI: finished  Rocephin afebrile 9.  DM type 2 with peripheral neuropathy: monitor BS with AC/HS cbg checks. Glucotrol on hold currently. Monitor po intake and will resume if BS on upward trend.  10. Diarrhea: Continue probiotic. C-diff negative low dose immodium check stool guaic, if positive will consult GI 11. ABLA: H/H has varied from 10.5-12.4 range. Will monitor especially with pradaxa resumed. 12. Insomnia: D/C ambien. Family to provide home RX sonata which is very effective 12.  Hypokalemia due to lasix +/- diarrhea will increase KCL  LOS (Days) 7 A FACE TO FACE EVALUATION WAS PERFORMED  Ledford Goodson E 06/13/2012, 8:38 AM

## 2012-06-14 ENCOUNTER — Inpatient Hospital Stay (HOSPITAL_COMMUNITY): Payer: Medicare Other | Admitting: Occupational Therapy

## 2012-06-14 ENCOUNTER — Inpatient Hospital Stay (HOSPITAL_COMMUNITY): Payer: Medicare Other

## 2012-06-14 ENCOUNTER — Inpatient Hospital Stay (HOSPITAL_COMMUNITY): Payer: Medicare Other | Admitting: Physical Therapy

## 2012-06-14 LAB — BASIC METABOLIC PANEL
GFR calc Af Amer: 66 mL/min — ABNORMAL LOW (ref >90)
GFR calc non Af Amer: 57 mL/min — ABNORMAL LOW (ref >90)
Potassium: 3.6 mEq/L (ref 3.5–5.1)
Sodium: 137 mEq/L (ref 135–145)

## 2012-06-14 LAB — GLUCOSE, CAPILLARY: Glucose-Capillary: 116 mg/dL — ABNORMAL HIGH (ref 70–99)

## 2012-06-14 LAB — CBC
Hemoglobin: 11.2 g/dL — ABNORMAL LOW (ref 12.0–15.0)
MCHC: 33.5 g/dL (ref 30.0–36.0)
RBC: 3.4 MIL/uL — ABNORMAL LOW (ref 3.87–5.11)
WBC: 7.8 10*3/uL (ref 4.0–10.5)

## 2012-06-14 NOTE — Progress Notes (Signed)
Pt states she liked her settings from the previous night

## 2012-06-14 NOTE — Progress Notes (Signed)
Pt requested to be placed on her cpap. Pt uses home head gear. No changes to previous rt setup.

## 2012-06-14 NOTE — Progress Notes (Signed)
Nutrition Follow-up  Intervention:   1. Change diet to Carbohydrate Modified Medium - continue 2 gram sodium restriction 2. Discontinue Ensure Complete PO BID - per pt request 3. Continue Ensure Pudding 4. RD to continue to follow nutrition care plan  Assessment:   Weight trending down, noted pt currently on lasix. Pt states intake is poor. Discussed importance of nutrition.  Continues to have diarrhea from recent abx use, per pt. Interested in a different diet to help with PO variety. Discussed with PA, Pam Love. Will change to CHO Modified Medium.  Diet Order:  [name] diet Supplements: Ensure Complete PO BID, Ensure Pudding PO BID  Meds: Scheduled Meds:   . allopurinol  300 mg Oral Daily  . amiodarone  200 mg Oral Daily  . antiseptic oral rinse  15 mL Mouth Rinse QID  . atorvastatin  10 mg Oral QPC supper  . dabigatran  75 mg Oral Q12H  . diltiazem  30 mg Oral Q6H  . famotidine  20 mg Oral QHS  . feeding supplement  237 mL Oral Q24H  . feeding supplement  1 Container Oral BID BM  . furosemide  80 mg Oral Daily  . gabapentin  300 mg Oral QHS  . insulin aspart  0-5 Units Subcutaneous QHS  . insulin aspart  0-9 Units Subcutaneous TID WC  . levothyroxine  88 mcg Oral QAC breakfast  . nystatin   Topical BID  . polycarbophil  1,250 mg Oral Daily  . potassium chloride  20 mEq Oral BID  . saccharomyces boulardii  250 mg Oral BID  . zaleplon  10 mg Oral QHS   Continuous Infusions:  PRN Meds:.acetaminophen, alum & mag hydroxide-simeth, bisacodyl, diphenhydrAMINE, diphenoxylate-atropine, guaiFENesin-dextromethorphan, levalbuterol, pramoxine-mineral oil-zinc, prochlorperazine, prochlorperazine, prochlorperazine, traMADol, witch hazel-glycerin  Labs:  CMP     Component Value Date/Time   NA 137 06/14/2012 0650   K 3.6 06/14/2012 0650   CL 97 06/14/2012 0650   CO2 29 06/14/2012 0650   GLUCOSE 99 06/14/2012 0650   BUN 8 06/14/2012 0650   CREATININE 0.90 06/14/2012 0650   CALCIUM 8.1*  06/14/2012 0650   PROT 4.5* 06/07/2012 0500   ALBUMIN 1.7* 06/07/2012 0500   AST 20 06/07/2012 0500   ALT 11 06/07/2012 0500   ALKPHOS 83 06/07/2012 0500   BILITOT 0.3 06/07/2012 0500   GFRNONAA 57* 06/14/2012 0650   GFRAA 66* 06/14/2012 0650     Intake/Output Summary (Last 24 hours) at 06/14/12 1049 Last data filed at 06/14/12 0917  Gross per 24 hour  Intake    480 ml  Output      1 ml  Net    479 ml  BM 8/15  Weight Status:  175 lb - wt down 16 lb x 4 days  Estimated needs:  1500 - 1700 kcal, 75 - 85 grams protein  Nutrition Dx:  Inadequate oral intake r/t poor appetite AEB pt report. Resolved.  Goal: Pt to meet >/= 90% of their estimated nutrition needs - not met  Monitor:  Weights, labs, PO intake, I/O's  Judith Motto MS, RD, LDN Pager: [phone] After-hours pager: [phone]

## 2012-06-14 NOTE — Progress Notes (Signed)
Occupational Therapy Session Note  Patient Details  Name: [name] MRN: [id number] Date of Birth: 19-Sep-1924  Today's Date: 06/14/2012 Time: 1000-1100 and 4098-1191 Time Calculation (min): 60 min and 24 min  Short Term Goals: Week 2:  OT Short Term Goal 1 (Week 2): STG = LTGs due to remaining short LOS.  Supervision level overall  Skilled Therapeutic Interventions/Progress Updates:    1)Pt seen for ADL retraining at sink per pt's request. Pt completed UB bathing and dressing with supervision/setup with increased independence with donning bra by hooking bra in front to increase independence. Pt demonstrated increased ability to reach BLE to complete LB bathing and dressing.  Use of long handled sponge with LB bathing and use of elastic shoelaces to improve independence with donning shoes. Pt required 2-3 rest breaks during session with O2 > 95% throughout activity. Simulated walk-in shower transfer with sidestepping over 3" ledge with use of Rolator walker and simulated grab bar.  2) 1:1 OT with focus on bed mobility and BUE strengthening.  Pt with increased fatigue following PT session, requesting to remain in room and perform theraband exercises.  Pt engaged in 3 reps of 10 UE theraband exercises seated at EOB.  Pt reporting increased fatigue and requesting to return to supine in bed.    Therapy Documentation Precautions:  Precautions Precautions: Fall Precaution Comments: diarrhea Restrictions Weight Bearing Restrictions: No Other Position/Activity Restrictions: lightheadedness with steps. pt has irregular HR General:   Vital Signs: Therapy Vitals Temp: 98.7 F (37.1 C) Temp src: Oral Pulse Rate: 93  Resp: 18  BP: 116/66 mmHg Patient Position, if appropriate: Sitting Oxygen Therapy SpO2: 94 % O2 Device: None (Room air) Pulse Oximetry Type: Intermittent Pain: Pain Assessment Pain Assessment: No/denies pain Pain Location: Abdomen ("hurts a little bit") Pain  Orientation: Left Pain Intervention(s): Medication (See eMAR)  See FIM for current functional status  Therapy/Group: Individual Therapy  Leonette Monarch 06/14/2012, 4:56 PM

## 2012-06-14 NOTE — Progress Notes (Signed)
Physical Therapy Weekly Progress Note  Patient Details  Name: [name] MRN: [id number] Date of Birth: 04-20-1924  Today's Date: 06/14/2012 Time:  - 13:30-14:30    Patient has met 4 of 5 short term goals.  Pt has made significant progress from admission. She initially required 2 person assist from weakness and now is ambulating with rollator  Walker at ,minguard/close supervision level. Pt can perform 8 steps with 1 rail with min assist and vc and frequently complains of lightheadedness with steps. Vitals have been [facility]. Pt scored 23/56 on the Berg and showed significant fall risk with TUG due to decreased cadence. Pt will dc home with 24 hour supervision provided by hired caregivers that were assisting prior to admission. Currently pt lacks activity tolerance and is having blood in her stools. Patient continues to demonstrate the following deficits: strength, balance, sensation, activity tolerance and therefore will continue to benefit from skilled PT intervention to enhance overall performance with activity tolerance and postural control.  Patient progressing toward long term goals..  Continue plan of care.  PT Short Term Goals Week 1:  PT Short Term Goal 1 (Week 1): Patient will perform bed mobility on flat bed with min A and 50% verbal cues for sequence PT Short Term Goal 1 - Progress (Week 1): Met PT Short Term Goal 2 (Week 1): Patient will perform sit <> stand and stand pivot transfers with min A and 50% verbal cues for sequence PT Short Term Goal 2 - Progress (Week 1): Met PT Short Term Goal 3 (Week 1): Patient will perform w/c mobility on unit for UE strength/endurance with bilat UE propulsion x 150 with min A and 2 rest breaks PT Short Term Goal 3 - Progress (Week 1): Discontinued (comment) PT Short Term Goal 4 (Week 1): Patient will perform gait training with 4WW x 50' with min A with improved upright trunk and step and stride length bilaterally PT Short Term Goal 4 - Progress  (Week 1): Met PT Short Term Goal 5 (Week 1): Patient will perform up and down 4 steps with R rail and mod A for home entry/exit training PT Short Term Goal 5 - Progress (Week 1): Met  Skilled Therapeutic Interventions/Progress Updates:      Therapy Documentation Precautions:  Precautions Precautions: Fall Precaution Comments: diarrhea Restrictions Weight Bearing Restrictions: No Other Position/Activity Restrictions: lightheadedness with steps. pt has irregular HR General:   Vital Signs: Therapy Vitals Pulse Rate: 85  BP: 114/68 mmHg Patient Position, if appropriate: Sitting Oxygen Therapy SpO2: 99 % O2 Device: None (Room air) Pulse Oximetry Type: Intermittent Pain: Pain Assessment Pain Assessment: No/denies pain Mobility: Bed Mobility Bed Mobility: Supine to Sit Supine to Sit: 5: Supervision Supine to Sit Details: Verbal cues for technique Supine to Sit Details (indicate cue type and reason): increased time needed for all task Sitting - Scoot to Edge of Bed: 5: Supervision Sit to Supine: 5: Supervision Transfers Sit to Stand: 5: Supervision;With armrests;From bed;With upper extremity assist Sit to Stand Details (indicate cue type and reason): vc for hand placement Stand to Sit: 5: Supervision Stand Pivot Transfers: 5: Supervision Locomotion : Ambulation Ambulation: Yes Ambulation/Gait Assistance: 5: Supervision Ambulation Distance (Feet): 130 Feet Assistive device: 4-wheeled walker Ambulation/Gait Assistance Details: slow cadence Gait Gait: Yes Gait Pattern: Step-through pattern;Narrow base of support;Decreased step length - right;Decreased step length - left Gait velocity: very slow 50 seconds TUG Stairs / Additional Locomotion Stairs: Yes Stairs Assistance: 4: Min assist Stairs Assistance Details: Verbal cues for  technique;Verbal cues for gait pattern Stairs Assistance Details (indicate cue type and reason): vc and demonstration to shift weight over leg that  was lifting her and to psh on rail instead of pull. Stair Management Technique: One rail Left Number of Stairs: 8  Ramp: 4: Min assist Curb: 4: Min Technical sales engineer Mobility: No  Trunk/Postural Assessment : Cervical Assessment Cervical Assessment: Within Functional Limits Thoracic Assessment Thoracic Assessment: Within Functional Limits Lumbar Assessment Lumbar Assessment: Within Functional Limits Postural Control Postural Control: Within Functional Limits  Balance: Standardized Balance Assessment Standardized Balance Assessment: Berg Balance Test Berg Balance Test Sit to Stand: Able to stand  independently using hands Standing Unsupported: Able to stand 2 minutes with supervision Sitting with Back Unsupported but Feet Supported on Floor or Stool: Able to sit safely and securely 2 minutes Stand to Sit: Sits safely with minimal use of hands Transfers: Able to transfer with verbal cueing and /or supervision Standing Unsupported with Eyes Closed: Able to stand 10 seconds with supervision Standing Ubsupported with Feet Together: Needs help to attain position and unable to hold for 15 seconds From Standing, Reach Forward with Outstretched Arm: Loses balance while trying/requires external support From Standing Position, Pick up Object from Floor: Able to pick up shoe, needs supervision From Standing Position, Turn to Look Behind Over each Shoulder: Needs supervision when turning Turn 360 Degrees: Needs assistance while turning Standing Unsupported, Alternately Place Feet on Step/Stool: Needs assistance to keep from falling or unable to try Standing Unsupported, One Foot in Front: Loses balance while stepping or standing Standing on One Leg: Unable to try or needs assist to prevent fall Total Score: 23  Exercises:      See FIM for current functional status  Therapy/Group: Individual Therapy  [name] 06/14/2012, 3:26 PM

## 2012-06-14 NOTE — Consult Note (Signed)
Subjective:   HPI  The patient is an 78 year old female who is currently in the rehabilitation section of the hospital. [name] asked to see her in regards to diarrhea, rectal bleeding, and heme positive stool. The patient has been undergoing antibiotic therapy for a chronic urinary tract infection. She started on this treatment and was on antibiotics for a month. She states that she stopped antibiotics a couple of days ago. While on antibiotics she started having diarrhea. A recent stool for Clostridium difficile toxin was negative. Yesterday she noticed some rectal bleeding. Today she had some diarrhea and just had some bleeding when she wiped. She gives a history of having had colonoscopies in the past. She states that her last colonoscopy was 2 years ago. She has a history of colon polyps but her last colonoscopy according to her revealed the colon was free of polyps. She was also told that she had diverticulosis. She also gives a history of hemorrhoids. She has no complaints of abdominal pain.  Review of Systems Denies chest pain or shortness of breath.  Past Medical History  Diagnosis Date  . CHF (congestive heart failure)   . Hypertension   . Atrial fibrillation   . Peripheral neuropathy   . Asthma   . High cholesterol   . Type II diabetes mellitus   . PAD (peripheral artery disease)   . Enlarged heart   . Anginal pain 05/28/12  . Pneumonia 11/2010  . Sleep apnea   . Hypothyroidism   . Headache 05/29/12    "recently"  . Stroke 05/2005; 08/2010    !mini"  . Chronic kidney disease     "something on labs always say she has some problems"  . Personal history of gout    Past Surgical History  Procedure Date  . Tonsillectomy and adenoidectomy 1960's  . Dilation and curettage of uterus   . Vaginal hysterectomy 1970's  . Total knee arthroplasty 1970-~2002    left; right  . Cataract extraction w/ intraocular lens  implant, bilateral ?1970's   History   Social History  . Marital  Status: Widowed    Spouse Name: N/A    Number of Children: N/A  . Years of Education: N/A   Occupational History  . Not on file.   Social History Main Topics  . Smoking status: Never Smoker   . Smokeless tobacco: Never Used  . Alcohol Use: No  . Drug Use: No  . Sexually Active: No   Other Topics Concern  . Not on file   Social History Narrative  . No narrative on file   family history is not on file. Current facility-administered medications:acetaminophen (TYLENOL) tablet 650 mg, 650 mg, Oral, Q4H PRN, Joseph Art, DO;  allopurinol (ZYLOPRIM) tablet 300 mg, 300 mg, Oral, Daily, Joseph Art, DO, 300 mg at 06/14/12 5784;  alum & mag hydroxide-simeth (MAALOX/MYLANTA) 200-200-20 MG/5ML suspension 30 mL, 30 mL, Oral, Q4H PRN, Jacquelynn Cree, PA amiodarone (PACERONE) tablet 200 mg, 200 mg, Oral, Daily, Joseph Art, DO, 200 mg at 06/14/12 6962;  antiseptic oral rinse (BIOTENE) solution 15 mL, 15 mL, Mouth Rinse, QID, Jessica U Vann, DO, 15 mL at 06/14/12 0400;  atorvastatin (LIPITOR) tablet 10 mg, 10 mg, Oral, QPC supper, Jessica U Vann, DO, 10 mg at 06/13/12 1703;  bisacodyl (DULCOLAX) suppository 10 mg, 10 mg, Rectal, Daily PRN, Jacquelynn Cree, PA dabigatran (PRADAXA) capsule 75 mg, 75 mg, Oral, Q12H, Jacquelynn Cree, PA, 75 mg at 06/14/12 0826;  diltiazem (CARDIZEM) tablet 30 mg, 30 mg, Oral, Q6H, Evlyn Kanner Love, PA, 30 mg at 06/14/12 0545;  diphenhydrAMINE (BENADRYL) 12.5 MG/5ML elixir 12.5-25 mg, 12.5-25 mg, Oral, Q6H PRN, Jacquelynn Cree, PA;  diphenoxylate-atropine (LOMOTIL) 2.5-0.025 MG per tablet 1 tablet, 1 tablet, Oral, QID PRN, Erick Colace, MD famotidine (PEPCID) tablet 20 mg, 20 mg, Oral, QHS, Jessica U Vann, DO, 20 mg at 06/13/12 2216;  feeding supplement (ENSURE) pudding 1 Container, 1 Container, Oral, BID BM, Joseph Art, DO, 1 Container at 06/13/12 1400;  furosemide (LASIX) tablet 80 mg, 80 mg, Oral, Daily, Donato Schultz, MD, 80 mg at 06/14/12 0825;  gabapentin  (NEURONTIN) capsule 300 mg, 300 mg, Oral, QHS, Jessica U Vann, DO, 300 mg at 06/13/12 2215 guaiFENesin-dextromethorphan (ROBITUSSIN DM) 100-10 MG/5ML syrup 5-10 mL, 5-10 mL, Oral, Q6H PRN, Evlyn Kanner Love, PA;  insulin aspart (novoLOG) injection 0-5 Units, 0-5 Units, Subcutaneous, QHS, Pamela S Love, PA;  insulin aspart (novoLOG) injection 0-9 Units, 0-9 Units, Subcutaneous, TID WC, Jacquelynn Cree, PA, 2 Units at 06/13/12 1225;  levalbuterol (XOPENEX) nebulizer solution 0.63 mg, 0.63 mg, Nebulization, Q2H PRN, Joseph Art, DO levothyroxine (SYNTHROID, LEVOTHROID) tablet 88 mcg, 88 mcg, Oral, QAC breakfast, Joseph Art, DO, 88 mcg at 06/14/12 0545;  nystatin (MYCOSTATIN/NYSTOP) topical powder, , Topical, BID, Ranelle Oyster, MD;  polycarbophil (FIBERCON) tablet 1,250 mg, 1,250 mg, Oral, Daily, Pamela S Love, PA, 1,250 mg at 06/14/12 0825;  potassium chloride SA (K-DUR,KLOR-CON) CR tablet 20 mEq, 20 mEq, Oral, BID, Erick Colace, MD, 20 mEq at 06/14/12 1610 pramoxine-mineral oil-zinc (TUCK'S) rectal ointment, , Rectal, TID PRN, Joseph Art, DO;  prochlorperazine (COMPAZINE) injection 5-10 mg, 5-10 mg, Intramuscular, Q6H PRN, Jacquelynn Cree, PA;  prochlorperazine (COMPAZINE) suppository 12.5 mg, 12.5 mg, Rectal, Q6H PRN, Evlyn Kanner Love, PA;  prochlorperazine (COMPAZINE) tablet 5-10 mg, 5-10 mg, Oral, Q6H PRN, Jacquelynn Cree, PA saccharomyces boulardii (FLORASTOR) capsule 250 mg, 250 mg, Oral, BID, Pamela S Love, PA, 250 mg at 06/14/12 9604;  traMADol (ULTRAM) tablet 50 mg, 50 mg, Oral, Q6H PRN, Jacquelynn Cree, PA;  witch hazel-glycerin (TUCKS) pad, , Topical, PRN, Joseph Art, DO;  zaleplon (SONATA) capsule 10 mg, 10 mg, Oral, QHS, Erick Colace, MD, 10 mg at 06/13/12 2214 DISCONTD: feeding supplement (ENSURE COMPLETE) liquid 237 mL, 237 mL, Oral, Q24H, Haynes Bast, RD, 237 mL at 06/12/12 1256 Allergies  Allergen Reactions  . Aspirin Other (See Comments)    Tears my stomach up; "can take  coated Aspirin qd without problems"  . Nexium (Esomeprazole Magnesium) Rash  . Penicillins Rash    Tolerates Rocephin     Objective:     BP 108/72  Pulse 83  Temp 97.5 F (36.4 C) (Oral)  Resp 18  Ht 5\' 7"  (1.702 m)  Wt 79.4 kg (175 lb 0.7 oz)  BMI 27.42 kg/m2  SpO2 92%  Alert. She is in no distress. She is sitting up eating her lunch.  Heart regular rhythm no murmurs.  Lungs clear.  Abdomen: Soft and nontender  Laboratory No components found with this basename: d1      Assessment:   Antibiotic associated diarrhea. There was no evidence of Clostridium difficile recently.  Rectal bleeding with a stable hemoglobin. This could be a mild diverticular bleed or hemorrhoidal in nature. She is not complaining of abdominal pain therefore I do not think we would be dealing with an ischemic colitis at this time.  Plan:     Supportive care and observation I think are the most prudent thing to do at this time. Hopefully with the discontinuation of her antibiotics a couple of days ago her diarrhea will resolve with eventual normalization of gut flora. We will continue to monitor for signs of excessive bleeding. Lab Results  Component Value Date   HGB 11.2* 06/14/2012   HGB 12.2 06/13/2012   HGB 11.1* 06/07/2012   HCT 33.4* 06/14/2012   HCT 37.2 06/13/2012   HCT 32.8* 06/07/2012   ALKPHOS 83 06/07/2012   ALKPHOS 75 05/31/2012   ALKPHOS 86 05/29/2012   AST 20 06/07/2012   AST 21 05/31/2012   AST 36 05/29/2012   ALT 11 06/07/2012   ALT 15 05/31/2012   ALT 17 05/29/2012   AMYLASE 62 05/31/2012

## 2012-06-14 NOTE — Progress Notes (Signed)
Physical Therapy Session Note  Patient Details  Name: [name] MRN: [id number] Date of Birth: 22-Apr-1924  Today's Date: 06/14/2012 Time: 1120-1200 Time Calculation (min): 40 min  Short Term Goals: Week 1:  PT Short Term Goal 1 (Week 1): Patient will perform bed mobility on flat bed with min A and 50% verbal cues for sequence PT Short Term Goal 1 - Progress (Week 1): Met PT Short Term Goal 2 (Week 1): Patient will perform sit <> stand and stand pivot transfers with min A and 50% verbal cues for sequence PT Short Term Goal 2 - Progress (Week 1): Met PT Short Term Goal 3 (Week 1): Patient will perform w/c mobility on unit for UE strength/endurance with bilat UE propulsion x 150 with min A and 2 rest breaks PT Short Term Goal 3 - Progress (Week 1): Discontinued (comment) PT Short Term Goal 4 (Week 1): Patient will perform gait training with 4WW x 50' with min A with improved upright trunk and step and stride length bilaterally PT Short Term Goal 4 - Progress (Week 1): Met PT Short Term Goal 5 (Week 1): Patient will perform up and down 4 steps with R rail and mod A for home entry/exit training PT Short Term Goal 5 - Progress (Week 1): Met Week 2:     Skilled Therapeutic Interventions/Progress Updates:      Therapy Documentation Precautions:  Precautions Precautions: Fall Precaution Comments: diarrhea Restrictions Weight Bearing Restrictions: No Other Position/Activity Restrictions: lightheadedness with steps. pt has irregular HR General:   Vital Signs: Therapy Vitals Temp: 98.7 F (37.1 C) Temp src: Oral Pulse Rate: 93  Resp: 18  BP: 116/66 mmHg Patient Position, if appropriate: Sitting Oxygen Therapy SpO2: 94 % O2 Device: None (Room air) Pulse Oximetry Type: Intermittent Pain: Pain Assessment Pain Assessment: No/denies pain Pain Location: Abdomen ("hurts a little bit") Pain Orientation: Left Pain Intervention(s): Medication (See eMAR) Mobility: Bed  Mobility Bed Mobility: Supine to Sit Supine to Sit: 5: Supervision Supine to Sit Details: Verbal cues for technique Supine to Sit Details (indicate cue type and reason): increased time needed for all task Sitting - Scoot to Edge of Bed: 5: Supervision Sit to Supine: 5: Supervision Transfers Sit to Stand: 5: Supervision;With armrests;From bed;With upper extremity assist Sit to Stand Details (indicate cue type and reason): vc for hand placement Stand to Sit: 5: Supervision Stand Pivot Transfers: 5: Supervision Locomotion : Ambulation Ambulation: Yes Ambulation/Gait Assistance: 5: Supervision Ambulation Distance (Feet): 130 Feet Assistive device: 4-wheeled walker Ambulation/Gait Assistance Details: slow cadence Gait Gait: Yes Gait Pattern: Step-through pattern;Narrow base of support;Decreased step length - right;Decreased step length - left Gait velocity: very slow 50 seconds TUG Stairs / Additional Locomotion Stairs: Yes Stairs Assistance: 4: Min assist Stairs Assistance Details: Verbal cues for technique;Verbal cues for gait pattern Stairs Assistance Details (indicate cue type and reason): vc and demonstration to shift weight over leg that was lifting her and to psh on rail instead of pull. Stair Management Technique: One rail Left Number of Stairs: 8  Ramp: 4: Min assist Curb: 4: Min Technical sales engineer Mobility: No  Trunk/Postural Assessment : Cervical Assessment Cervical Assessment: Within Functional Limits Thoracic Assessment Thoracic Assessment: Within Functional Limits Lumbar Assessment Lumbar Assessment: Within Functional Limits Postural Control Postural Control: Within Functional Limits  Balance:   Exercises:   Other Treatments:    See FIM for current functional status  Therapy/Group: Individual Therapy  Ruthellen Tippy 06/14/2012, 4:27 PM

## 2012-06-14 NOTE — Progress Notes (Signed)
Patient ID: [name], female   DOB: May 10, 1924, 78 y.o.   MRN: [id number] Subjective/Complaints: Incontinent Loose stools, per CNA red, mild LLQ pain, no vomiting  A 12 point review of systems has been performed and if not noted above is otherwise negative.   Objective: Vital Signs: Blood pressure 108/72, pulse 83, temperature 97.5 F (36.4 C), temperature source Oral, resp. rate 18, height 5\' 7"  (1.702 m), weight 79.4 kg (175 lb 0.7 oz), SpO2 92.00%. No results found.  Basename 06/14/12 0650 06/13/12 1101  WBC 7.8 10.5  HGB 11.2* 12.2  HCT 33.4* 37.2  PLT 277 310    Basename 06/14/12 0650 06/13/12 0653  NA 137 137  K 3.6 3.3*  CL 97 96  CO2 29 30  GLUCOSE 99 96  BUN 8 10  CREATININE 0.90 0.86  CALCIUM 8.1* 7.7*   CBG (last 3)   Basename 06/14/12 0749 06/13/12 2128 06/13/12 1637  GLUCAP 116* 106* 93    Wt Readings from Last 3 Encounters:  06/14/12 79.4 kg (175 lb 0.7 oz)  06/06/12 86.183 kg (190 lb)    Physical Exam:   General: Alert and oriented x 3, No apparent distress HEENT: Head is normocephalic, atraumatic, PERRLA, EOMI, sclera anicteric, oral mucosa pink and moist, dentition intact, ext ear canals clear,  Neck: Supple without JVD or lymphadenopathy Heart: Reg rate and rhythm. No murmurs rubs or gallops Chest: CTA bilaterally without wheezes, rales, or rhonchi; no distress Abdomen: Soft, mild LLQ, non-distended, bowel sounds positive. Extremities: No clubbing, cyanosis, or edema. Pulses are 2+ Skin: Clean and intact without signs of breakdown Neuro: Pt is cognitively appropriate with normal insight, memory, and awareness. Cranial nerves 2-12 are intact. Sensory exam is normal. Reflexes are 2+ in all 4's. Fine motor coordination is intact. No tremors. Motor function is grossly 5/5.  Musculoskeletal: Full ROM, No pain with AROM or PROM in the neck, trunk, or extremities. Posture appropriate Psych: Pt's affect is appropriate. Pt is  cooperative   Assessment/Plan: 1. Functional deficits secondary to deconditioning after urosepsis and multiple medical complications which require 3+ hours per day of interdisciplinary therapy in a comprehensive inpatient rehab setting. Physiatrist is providing close team supervision and 24 hour management of active medical problems listed below. Physiatrist and rehab team continue to assess barriers to discharge/monitor patient progress toward functional and medical goals. FIM: FIM - Bathing Bathing Steps Patient Completed: Chest;Right Arm;Left Arm;Abdomen;Front perineal area;Left lower leg (including foot);Buttocks;Right upper leg;Left upper leg;Right lower leg (including foot) Bathing: 4: Steadying assist (use of long handled sponge)  FIM - Upper Body Dressing/Undressing Upper body dressing/undressing steps patient completed: Thread/unthread right bra strap;Thread/unthread left bra strap;Thread/unthread right sleeve of pullover shirt/dresss;Thread/unthread left sleeve of pullover shirt/dress;Put head through opening of pull over shirt/dress;Pull shirt over trunk Upper body dressing/undressing: 4: Min-Patient completed 75 plus % of tasks FIM - Lower Body Dressing/Undressing Lower body dressing/undressing steps patient completed: Thread/unthread right underwear leg;Thread/unthread left underwear leg;Pull underwear up/down;Thread/unthread right pants leg;Thread/unthread left pants leg;Pull pants up/down;Don/Doff right shoe;Don/Doff left shoe Lower body dressing/undressing: 4: Steadying Assist  FIM - Toileting Toileting steps completed by patient: Adjust clothing prior to toileting;Performs perineal hygiene;Adjust clothing after toileting Toileting Assistive Devices: Grab bar or rail for support Toileting: 4: Steadying assist  FIM - Diplomatic Services operational officer Devices: Grab bars Toilet Transfers: 4-To toilet/BSC: Min A (steadying Pt. > 75%);4-From toilet/BSC: Min A (steadying  Pt. > 75%)  FIM - Bed/Chair Transfer Bed/Chair Transfer Assistive Devices: Manufacturing systems engineer Transfer: 4:  Chair or W/C > Bed: Min A (steadying Pt. > 75%);4: Sit > Supine: Min A (steadying pt. > 75%/lift 1 leg)  FIM - Locomotion: Wheelchair Distance: 80' with mod-max A for hand placement and UE propulsion sequence to maintain straight navigation and for changing direction; patient tends to propel with very short strokes.   Locomotion: Wheelchair: 0: Activity did not occur FIM - Locomotion: Ambulation Locomotion: Ambulation Assistive Devices: Designer, industrial/product (rollator) Ambulation/Gait Assistance: 4: Min guard Locomotion: Ambulation: 2: Travels 50 - 149 ft with minimal assistance (Pt.>75%)  Comprehension Comprehension Mode: Auditory Comprehension: 5-Understands complex 90% of the time/Cues < 10% of the time  Expression Expression Mode: Verbal Expression: 5-Expresses complex 90% of the time/cues < 10% of the time  Social Interaction Social Interaction: 5-Interacts appropriately 90% of the time - Needs monitoring or encouragement for participation or interaction.  Problem Solving Problem Solving: 6-Solves complex problems: With extra time  Memory Memory: 5-Recognizes or recalls 90% of the time/requires cueing < 10% of the time  Medical Problem List and Plan:  1. DVT Prophylaxis/Anticoagulation: Pharmaceutical: Pradexa  2. Pain Management: N/A. Continue neurontin for diabetic neuropathy.  3. Mood: alert and appropriate. Will monitor for now.  4. Neuropsych: This patient is capable of making decisions on his/her own behalf.  5. Acute on chronic CHF: daily weights. Low salt diet. Monitor for signs of fluid overload.  6. Atrial Fibrillation: Monitor HR with bid checks. Continue amiodarone, cardizem and pradaxa. Monitor H/H closely.  7. Hypothyroid: On supplement.  8. E. Coli UTI: finished  Rocephin afebrile 9. DM type 2 with peripheral neuropathy: monitor BS with AC/HS cbg checks.  Glucotrol on hold currently. Monitor po intake and will resume if BS on upward trend.  10. Diarrhea: Continue probiotic. C-diff negative low dose immodium check stool guaic, OB positive will consult GI 11. ABLA: H/H has varied from 10.5-12.4 range. Will monitor especially with pradaxa resumed. 12. Insomnia: D/C ambien. Family to provide home RX sonata which is very effective 12.  Hypokalemia due to lasix +/- diarrhea will increase KCL  LOS (Days) 8 A FACE TO FACE EVALUATION WAS PERFORMED  KIRSTEINS,ANDREW E 06/14/2012, 9:18 AM

## 2012-06-15 ENCOUNTER — Inpatient Hospital Stay (HOSPITAL_COMMUNITY): Payer: Medicare Other | Admitting: *Deleted

## 2012-06-15 LAB — CBC
HCT: 36.4 % (ref 36.0–46.0)
Hemoglobin: 12.1 g/dL (ref 12.0–15.0)
MCH: 33.2 pg (ref 26.0–34.0)
MCHC: 33.2 g/dL (ref 30.0–36.0)
MCV: 99.7 fL (ref 78.0–100.0)
RDW: 16.4 % — ABNORMAL HIGH (ref 11.5–15.5)

## 2012-06-15 LAB — GLUCOSE, CAPILLARY: Glucose-Capillary: 92 mg/dL (ref 70–99)

## 2012-06-15 NOTE — Progress Notes (Signed)
Still with loose stools, but HGB remains stable. RUE:[id number] associated diarrhea no C diff.         Mild rectal bleeding, could be diverticular or hemorrhoidal.  Plan: continue supportive care.

## 2012-06-15 NOTE — Progress Notes (Addendum)
Physical Therapy Session Note  Patient Details  Name: [name] MRN: [id number] Date of Birth: Nov 14, 1923  Today's Date: 06/15/2012 Time:  - 1300-1355 (55 minutes) group    Short Term Goals: Week 1:  PT Short Term Goal 1 (Week 1): Patient will perform bed mobility on flat bed with min A and 50% verbal cues for sequence PT Short Term Goal 1 - Progress (Week 1): Met PT Short Term Goal 2 (Week 1): Patient will perform sit <> stand and stand pivot transfers with min A and 50% verbal cues for sequence PT Short Term Goal 2 - Progress (Week 1): Met PT Short Term Goal 3 (Week 1): Patient will perform w/c mobility on unit for UE strength/endurance with bilat UE propulsion x 150 with min A and 2 rest breaks PT Short Term Goal 3 - Progress (Week 1): Discontinued (comment) PT Short Term Goal 4 (Week 1): Patient will perform gait training with 4WW x 50' with min A with improved upright trunk and step and stride length bilaterally PT Short Term Goal 4 - Progress (Week 1): Met PT Short Term Goal 5 (Week 1): Patient will perform up and down 4 steps with R rail and mod A for home entry/exit training PT Short Term Goal 5 - Progress (Week 1): Met  Skilled Therapeutic Interventions/Progress Updates: Gait safety/training/endurance on level/steps     Therapy Documentation Precautions:  Precautions Precautions: Fall Precaution Comments: diarrhea Restrictions Weight Bearing Restrictions: No Other Position/Activity Restrictions: lightheadedness with steps. pt has irregular HR       Pain: No complaint of pain       Locomotion : Pt ambulates to/from room (120 feet) SBA with 4 wheeled RW and vcs for direction finding; Up/down 4 steps X 2 with one rail RT min assist with vcs for hand placement (decreased LT knee strength)            See FIM for current functional status  Therapy/Group: Group Therapy  Dai Apel,JIM 06/15/2012, 12:47 PM

## 2012-06-15 NOTE — Progress Notes (Signed)
Patient ID: Moody Bruins, female   DOB: 11-29-1923, 78 y.o.   MRN: [id number] Subjective/Complaints: Incontinent Loose stools, per CNA red, mild LLQ pain, no vomiting  A 12 point review of systems has been performed and if not noted above is otherwise negative.   Objective: Vital Signs: Blood pressure 100/60, pulse 100, temperature 97.3 F (36.3 C), temperature source Oral, resp. rate 20, height 5\' 7"  (1.702 m), weight 76.1 kg (167 lb 12.3 oz), SpO2 97.00%. No results found.  Basename 06/15/12 0630 06/14/12 0650  WBC 8.6 7.8  HGB 12.1 11.2*  HCT 36.4 33.4*  PLT 287 277    Basename 06/14/12 0650 06/13/12 0653  NA 137 137  K 3.6 3.3*  CL 97 96  CO2 29 30  GLUCOSE 99 96  BUN 8 10  CREATININE 0.90 0.86  CALCIUM 8.1* 7.7*   CBG (last 3)   Basename 06/15/12 0726 06/14/12 2116 06/14/12 1604  GLUCAP 95 102* 147*    Wt Readings from Last 3 Encounters:  06/15/12 76.1 kg (167 lb 12.3 oz)  06/06/12 86.183 kg (190 lb)    Physical Exam:   General: Alert and oriented x 3, No apparent distress HEENT: Head is normocephalic, atraumatic, PERRLA, EOMI, sclera anicteric, oral mucosa pink and moist, dentition intact, ext ear canals clear,  Neck: Supple without JVD or lymphadenopathy Heart: Reg rate and rhythm. No murmurs rubs or gallops Chest: CTA bilaterally without wheezes, rales, or rhonchi; no distress Abdomen: Soft, mild LLQ, non-distended, bowel sounds positive. Extremities: No clubbing, cyanosis, or edema. Pulses are 2+ Skin: Clean and intact without signs of breakdown Neuro: Pt is cognitively appropriate with normal insight, memory, and awareness. Cranial nerves 2-12 are intact. Sensory exam is normal. Reflexes are 2+ in all 4's. Fine motor coordination is intact. No tremors. Motor function is grossly 5/5.  Musculoskeletal: Full ROM, No pain with AROM or PROM in the neck, trunk, or extremities. Posture appropriate Psych: Pt's affect is appropriate. Pt is  cooperative   Assessment/Plan: 1. Functional deficits secondary to deconditioning after urosepsis and multiple medical complications which require 3+ hours per day of interdisciplinary therapy in a comprehensive inpatient rehab setting. Physiatrist is providing close team supervision and 24 hour management of active medical problems listed below. Physiatrist and rehab team continue to assess barriers to discharge/monitor patient progress toward functional and medical goals. FIM: FIM - Bathing Bathing Steps Patient Completed: Chest;Right Arm;Left Arm;Abdomen;Front perineal area;Buttocks;Right upper leg;Left upper leg Bathing: 4: Min-Patient completes 8-9 62f 10 parts or 75+ percent  FIM - Upper Body Dressing/Undressing Upper body dressing/undressing steps patient completed: Thread/unthread right bra strap;Thread/unthread left bra strap;Hook/unhook bra;Thread/unthread right sleeve of pullover shirt/dresss;Thread/unthread left sleeve of pullover shirt/dress;Put head through opening of pull over shirt/dress;Pull shirt over trunk Upper body dressing/undressing: 5: Set-up assist to: Obtain clothing/put away FIM - Lower Body Dressing/Undressing Lower body dressing/undressing steps patient completed: Thread/unthread right underwear leg;Thread/unthread left underwear leg;Pull underwear up/down;Thread/unthread right pants leg;Thread/unthread left pants leg;Pull pants up/down;Don/Doff left sock;Don/Doff right sock Lower body dressing/undressing: 4: Steadying Assist  FIM - Toileting Toileting steps completed by patient: Adjust clothing prior to toileting;Performs perineal hygiene;Adjust clothing after toileting Toileting Assistive Devices: Grab bar or rail for support Toileting: 4: Steadying assist  FIM - Diplomatic Services operational officer Devices: Grab bars Toilet Transfers: 4-To toilet/BSC: Min A (steadying Pt. > 75%);4-From toilet/BSC: Min A (steadying Pt. > 75%)  FIM - Bed/Chair  Transfer Bed/Chair Transfer Assistive Devices: Environmental consultant (rollator) Bed/Chair Transfer: 5: Supine > Sit: Supervision (verbal  cues/safety issues);5: Sit > Supine: Supervision (verbal cues/safety issues);5: Bed > Chair or W/C: Supervision (verbal cues/safety issues);5: Chair or W/C > Bed: Supervision (verbal cues/safety issues)  FIM - Locomotion: Wheelchair Distance: 100' with mod-max A for hand placement and UE propulsion sequence to maintain straight navigation and for changing direction; patient tends to propel with very short strokes.   Locomotion: Wheelchair: 0: Activity did not occur FIM - Locomotion: Ambulation Locomotion: Ambulation Assistive Devices: Designer, industrial/product (rollator) Ambulation/Gait Assistance: 5: Supervision Locomotion: Ambulation: 2: Travels 50 - 149 ft with supervision/safety issues  Comprehension Comprehension Mode: Auditory Comprehension: 5-Understands complex 90% of the time/Cues < 10% of the time  Expression Expression Mode: Verbal Expression: 5-Expresses complex 90% of the time/cues < 10% of the time  Social Interaction Social Interaction: 5-Interacts appropriately 90% of the time - Needs monitoring or encouragement for participation or interaction.  Problem Solving Problem Solving: 5-Solves basic problems: With no assist  Memory Memory: 5-Recognizes or recalls 90% of the time/requires cueing < 10% of the time  Medical Problem List and Plan:  1. DVT Prophylaxis/Anticoagulation: Pharmaceutical: Pradexa continue 2. Pain Management: N/A. Continue neurontin for diabetic neuropathy.  3. Mood: alert and appropriate. Will monitor for now.  4. Neuropsych: This patient is capable of making decisions on his/her own behalf.  5. Acute on chronic CHF: daily weights. Low salt diet. Monitor for signs of fluid overload.  6. Atrial Fibrillation: Monitor HR with bid checks. Continue amiodarone, cardizem and pradaxa. Monitor H/H closely.  7. Hypothyroid: On supplement.  8. E.  Coli UTI: finished  Rocephin afebrile 9. DM type 2 with peripheral neuropathy: monitor BS with AC/HS cbg checks. Glucotrol on hold currently. Monitor po intake and will resume if BS on upward trend.  10. Diarrhea: Continue probiotic. C-diff negative low dose immodium check stool guaic, OB positive  GI consult- antibiotic associated diarrhea with either hemorrhoidal or small diverticular bleeding -minor will monitor  11. ABLA:Stable H/H has varied from 10.5-12.4 range. Will monitor especially with pradaxa resumed. 12. Insomnia: D/C ambien. Family to provide home RX sonata which is very effective 12.  Hypokalemia due to lasix +/- diarrhea will increase KCL  LOS (Days) 9 A FACE TO FACE EVALUATION WAS PERFORMED  Dason Mosley E 06/15/2012, 9:46 AM

## 2012-06-16 ENCOUNTER — Inpatient Hospital Stay (HOSPITAL_COMMUNITY): Payer: Medicare Other

## 2012-06-16 LAB — GLUCOSE, CAPILLARY
Glucose-Capillary: 109 mg/dL — ABNORMAL HIGH (ref 70–99)
Glucose-Capillary: 164 mg/dL — ABNORMAL HIGH (ref 70–99)
Glucose-Capillary: 95 mg/dL (ref 70–99)

## 2012-06-16 LAB — CBC
HCT: 35.7 % — ABNORMAL LOW (ref 36.0–46.0)
MCV: 98.6 fL (ref 78.0–100.0)
RBC: 3.62 MIL/uL — ABNORMAL LOW (ref 3.87–5.11)
RDW: 16.3 % — ABNORMAL HIGH (ref 11.5–15.5)
WBC: 9.9 10*3/uL (ref 4.0–10.5)

## 2012-06-16 NOTE — Progress Notes (Signed)
Occupational Therapy Session Note  Patient Details  Name: [name] MRN: [id number] Date of Birth: 10/03/1924  Today's Date: 06/16/2012 Time: 1117-1202 Time Calculation (min): 45 min  Short Term Goals: Week 2:  OT Short Term Goal 1 (Week 2): STG = LTGs due to remaining short LOS.  Supervision level overall  Skilled Therapeutic Interventions: ADL-retraining at w/c level, sink side, with emphasis on functional transfers, dynamic sitting balance, sequencing and short-term memory.   Patient able to complete seated bathing, dressing, and grooming at sink side with setup assist and min assist to apply lotion to her feet due to mild discomfort at lower abdomin.  Patient also struggled with donning her bra this date and required OT assist to fasten snaps.  As noon meal arrived patient needed to toilet again but was able to ambulate safely to bathroom using her 4ww and complete transfers with only close supervision for safety.  Patient progressing well and appears ready for discharge home.   Therapy Documentation Precautions:  Precautions Precautions: Fall Precaution Comments: diarrhea Restrictions Weight Bearing Restrictions: No Other Position/Activity Restrictions: lightheadedness with steps. pt has irregular HR Pain: Mild abdominal discomfort during supine-sit at bed and while bending forward to wash feet.   See FIM for current functional status  Therapy/Group: Individual Therapy  Georgeanne Nim 06/16/2012, 4:00 PM

## 2012-06-16 NOTE — Progress Notes (Signed)
Diarrhea has slowed. Some blood on toilet tissue. Abdomen soft. IMP: antibiotic associated diarrhea no C dif. Improving no that she is off antibiotics         Mild rectal bleeding. Probably hemorroidal. Plan: Continue observation.

## 2012-06-16 NOTE — Progress Notes (Signed)
Patient ID: [name], female   DOB: 03/19/1924, 78 y.o.   MRN: [id number] Subjective/Complaints: Incontinent Loose stools, per CNA red, mild LLQ pain, no vomiting.  Appreciate GI note "can I go home soon?"  A 12 point review of systems has been performed and if not noted above is otherwise negative.   Objective: Vital Signs: Blood pressure 118/73, pulse 127, temperature 98.4 F (36.9 C), temperature source Oral, resp. rate 19, height 5\' 7"  (1.702 m), weight 75.9 kg (167 lb 5.3 oz), SpO2 96.00%. No results found.  Basename 06/16/12 0714 06/15/12 0630  WBC 9.9 8.6  HGB 11.7* 12.1  HCT 35.7* 36.4  PLT 299 287    Basename 06/14/12 0650  NA 137  K 3.6  CL 97  CO2 29  GLUCOSE 99  BUN 8  CREATININE 0.90  CALCIUM 8.1*   CBG (last 3)   Basename 06/16/12 0721 06/15/12 2026 06/15/12 1622  GLUCAP 109* 200* 92    Wt Readings from Last 3 Encounters:  06/16/12 75.9 kg (167 lb 5.3 oz)  06/06/12 86.183 kg (190 lb)    Physical Exam:   General: Alert and oriented x 3, No apparent distress HEENT: Head is normocephalic, atraumatic, PERRLA, EOMI, sclera anicteric, oral mucosa pink and moist, dentition intact, ext ear canals clear,  Neck: Supple without JVD or lymphadenopathy Heart: Reg rate and rhythm. No murmurs rubs or gallops Chest: CTA bilaterally without wheezes, rales, or rhonchi; no distress Abdomen: Soft, mild LLQ, non-distended, bowel sounds positive. Extremities: No clubbing, cyanosis, or edema. Pulses are 2+ Skin: Clean and intact without signs of breakdown Neuro: Pt is cognitively appropriate with normal insight, memory, and awareness. Cranial nerves 2-12 are intact. Sensory exam is normal. Reflexes are 2+ in all 4's. Fine motor coordination is intact. No tremors. Motor function is grossly 5/5.  Musculoskeletal: Full ROM, No pain with AROM or PROM in the neck, trunk, or extremities. Posture appropriate Psych: Pt's affect is appropriate. Pt is  cooperative   Assessment/Plan: 1. Functional deficits secondary to deconditioning after urosepsis and multiple medical complications which require 3+ hours per day of interdisciplinary therapy in a comprehensive inpatient rehab setting. Physiatrist is providing close team supervision and 24 hour management of active medical problems listed below. Physiatrist and rehab team continue to assess barriers to discharge/monitor patient progress toward functional and medical goals. FIM: FIM - Bathing Bathing Steps Patient Completed: Chest;Right Arm;Left Arm;Abdomen;Front perineal area;Buttocks;Right upper leg;Left upper leg;Right lower leg (including foot);Left lower leg (including foot) Bathing: 5: Set-up assist to: Open items  FIM - Upper Body Dressing/Undressing Upper body dressing/undressing steps patient completed: Thread/unthread right bra strap;Thread/unthread left bra strap;Hook/unhook bra;Thread/unthread right sleeve of pullover shirt/dresss;Thread/unthread left sleeve of pullover shirt/dress;Put head through opening of pull over shirt/dress;Pull shirt over trunk Upper body dressing/undressing: 5: Set-up assist to: Obtain clothing/put away FIM - Lower Body Dressing/Undressing Lower body dressing/undressing steps patient completed: Thread/unthread right underwear leg;Thread/unthread left underwear leg;Pull underwear up/down;Thread/unthread right pants leg;Thread/unthread left pants leg;Pull pants up/down Lower body dressing/undressing: 4: Steadying Assist  FIM - Toileting Toileting steps completed by patient: Adjust clothing prior to toileting;Performs perineal hygiene;Adjust clothing after toileting Toileting Assistive Devices: Grab bar or rail for support Toileting: 4: Steadying assist  FIM - Diplomatic Services operational officer Devices: Grab bars Toilet Transfers: 4-To toilet/BSC: Min A (steadying Pt. > 75%);4-From toilet/BSC: Min A (steadying Pt. > 75%)  FIM - Bed/Chair  Transfer Bed/Chair Transfer Assistive Devices: Environmental consultant (rollator) Bed/Chair Transfer: 5: Supine > Sit: Supervision (verbal cues/safety  issues);5: Bed > Chair or W/C: Supervision (verbal cues/safety issues);5: Chair or W/C > Bed: Supervision (verbal cues/safety issues)  FIM - Locomotion: Wheelchair Distance: 20' with mod-max A for hand placement and UE propulsion sequence to maintain straight navigation and for changing direction; patient tends to propel with very short strokes.   Locomotion: Wheelchair: 0: Activity did not occur FIM - Locomotion: Ambulation Locomotion: Ambulation Assistive Devices: Designer, industrial/product (rollator) Ambulation/Gait Assistance: 5: Supervision Locomotion: Ambulation: 2: Travels 50 - 149 ft with supervision/safety issues  Comprehension Comprehension Mode: Auditory Comprehension: 5-Understands complex 90% of the time/Cues < 10% of the time  Expression Expression Mode: Verbal Expression: 5-Expresses complex 90% of the time/cues < 10% of the time  Social Interaction Social Interaction: 5-Interacts appropriately 90% of the time - Needs monitoring or encouragement for participation or interaction.  Problem Solving Problem Solving: 5-Solves basic problems: With no assist  Memory Memory: 5-Recognizes or recalls 90% of the time/requires cueing < 10% of the time  Medical Problem List and Plan:  1. DVT Prophylaxis/Anticoagulation: Pharmaceutical: Pradexa continue 2. Pain Management: N/A. Continue neurontin for diabetic neuropathy.  3. Mood: alert and appropriate. Will monitor for now.  4. Neuropsych: This patient is capable of making decisions on his/her own behalf.  5. Acute on chronic CHF: daily weights. Low salt diet. Monitor for signs of fluid overload.  6. Atrial Fibrillation: Monitor HR with bid checks. Continue amiodarone, cardizem and pradaxa. Monitor H/H closely.  7. Hypothyroid: On supplement.  8. [name]. Coli UTI: finished  Rocephin afebrile 9. DM type 2 with  peripheral neuropathy: monitor BS with AC/HS cbg checks. Glucotrol on hold currently. Monitor po intake and will resume if BS on upward trend.  10. Diarrhea: Continue probiotic. C-diff negative low dose immodium check stool guaic, OB positive  GI consult- antibiotic associated diarrhea with either hemorrhoidal or small diverticular bleeding -minor will monitor  11. ABLA:Stable H/H has varied from 10.5-12.4 range. Will monitor especially with pradaxa resumed. 12. Insomnia: D/C ambien. Family to provide home RX sonata which is very effective 12.  Hypokalemia due to lasix +/- diarrhea will increase KCL  LOS (Days) 10 A FACE TO FACE EVALUATION WAS PERFORMED  [name] 06/16/2012, 9:32 AM

## 2012-06-17 ENCOUNTER — Inpatient Hospital Stay (HOSPITAL_COMMUNITY): Payer: Medicare Other

## 2012-06-17 ENCOUNTER — Inpatient Hospital Stay (HOSPITAL_COMMUNITY): Payer: Medicare Other | Admitting: Physical Therapy

## 2012-06-17 ENCOUNTER — Inpatient Hospital Stay (HOSPITAL_COMMUNITY): Payer: Medicare Other | Admitting: Occupational Therapy

## 2012-06-17 LAB — GLUCOSE, CAPILLARY
Glucose-Capillary: 112 mg/dL — ABNORMAL HIGH (ref 70–99)
Glucose-Capillary: 119 mg/dL — ABNORMAL HIGH (ref 70–99)
Glucose-Capillary: 119 mg/dL — ABNORMAL HIGH (ref 70–99)

## 2012-06-17 LAB — URINALYSIS, ROUTINE W REFLEX MICROSCOPIC
Glucose, UA: NEGATIVE mg/dL
Ketones, ur: NEGATIVE mg/dL
Leukocytes, UA: NEGATIVE
Specific Gravity, Urine: 1.008 (ref 1.005–1.030)
pH: 7 (ref 5.0–8.0)

## 2012-06-17 LAB — CBC
HCT: 36.4 % (ref 36.0–46.0)
Hemoglobin: 11.8 g/dL — ABNORMAL LOW (ref 12.0–15.0)
MCH: 32 pg (ref 26.0–34.0)
MCHC: 32.4 g/dL (ref 30.0–36.0)

## 2012-06-17 LAB — OCCULT BLOOD X 1 CARD TO LAB, STOOL: Fecal Occult Bld: POSITIVE

## 2012-06-17 MED ORDER — CALCIUM POLYCARBOPHIL 625 MG PO TABS
1250.0000 mg | ORAL_TABLET | Freq: Two times a day (BID) | ORAL | Status: DC
  Administered 2012-06-17 – 2012-06-20 (×6): 1250 mg via ORAL
  Filled 2012-06-17 (×8): qty 2

## 2012-06-17 MED ORDER — METOPROLOL SUCCINATE 12.5 MG HALF TABLET
12.5000 mg | ORAL_TABLET | Freq: Every day | ORAL | Status: DC
  Administered 2012-06-18 – 2012-06-19 (×2): 12.5 mg via ORAL
  Filled 2012-06-17 (×5): qty 1

## 2012-06-17 NOTE — Progress Notes (Signed)
Physical Therapy Note  Patient Details  Name: [name] MRN: [id number] Date of Birth: [birth year] Today's Date: 06/17/2012  15:00-15:45 individual therapy pt complained of pain in left knee with bending, ice applied after session.  Attempted gait outside, however once we got downstairs it started raining. performed gait in room with rollator minguard assist, sit to supine min assist for 1 leg.   [name] 06/17/2012, 4:15 PM

## 2012-06-17 NOTE — Progress Notes (Signed)
Coming up hallway, this RN heard a noise coming from patient's room at 0015.  Upon entering room, found patient lying in floor of bathroom on lt side with rolling walker in doorway of bathroom.  Upon questioning patient, she states,  "I was trying to get situated on the commode and got off balanced."  Upon examination of patient, found abrasion and small amt bruising to lt knee.  Dr. Wynn Banker notified of patient's fall; vital signs:  BP 122/71, T 98.5, P 103, R 20; and patient's c/o lt knee pain with laceration and bruising with patient stating she has had a knee replacement in the pass.  Order received:  Xray of lt knee 2 view indicated for lt knee pain after fall.

## 2012-06-17 NOTE — Progress Notes (Signed)
GASTROENTEROLOGY PROGRESS NOTE  Problem:   Diarrhea following antibiotic therapy, C. difficile negative  Subjective: The patient feels she's "about 50%" better compared to when she was at her worst. She does feel like her stools are becoming somewhat less frequent, and less liquid in character. She has had 2 bowel movements today, somewhat loose in character.  However, her daughter points out that the patient, even at baseline, has several loose bowel movements a day. This is normal for her, and may be related to the fact that she eats a large amount of fresh fruit.  Objective: The patient is currently on a piece of exercise equipment in the rehabilitation gymnasium. She appears in no distress and in good spirits.  She is on Florastor probiotic therapy, one capsule twice daily.  Assessment: 1. Gradual improvement, toward baseline, in diarrhea following completion of antibiotic therapy recently. 2. Tendency for her baseline loose stools/IBS  Plan: Advised to avoid fresh fruits and vegetables on a trial basis for the next few days. We will follow the patient with you periodically. Please call us in the meantime if any earlier input is needed.  [name], M.D. 06/17/2012 2:45 PM

## 2012-06-17 NOTE — Progress Notes (Signed)
Patient ID: [name], female   DOB: 13-Feb-1924, 78 y.o.   MRN: [id number] Subjective/Complaints: Incontinent Loose stools, per CNA red, mild LLQ pain, no vomiting.  Appreciate GI note "can I go home soon?"  A 12 point review of systems has been performed and if not noted above is otherwise negative.   Objective: Vital Signs: Blood pressure 107/67, pulse 105, temperature 98.6 F (37 C), temperature source Oral, resp. rate 18, height 5\' 7"  (1.702 m), weight 73 kg (160 lb 15 oz), SpO2 95.00%. Dg Knee 1-2 Views Left  06/17/2012  *RADIOLOGY REPORT*  Clinical Data: Knee pain after fall.  LEFT KNEE - 1-2 VIEW  Comparison: None.  Findings: Left total knee arthroplasty.  Components appear well seated.  No evidence of acute fracture or dislocation of the left knee.  No significant effusion.  No focal bone lesion or bone destruction.  Extensive vascular calcifications.  IMPRESSION: Left knee arthroplasty.  No acute fractures.  Original Report Authenticated By: Marlon Pel, M.D.    Basename 06/17/12 0455 06/16/12 0714  WBC 12.9* 9.9  HGB 11.8* 11.7*  HCT 36.4 35.7*  PLT 328 299   No results found for this basename: NA:2,K:2,CL:2,CO2:2,GLUCOSE:2,BUN:2,CREATININE:2,CALCIUM:2 in the last 72 hours CBG (last 3)   Basename 06/17/12 0718 06/17/12 0059 06/16/12 2040  GLUCAP 112* 138* 95    Wt Readings from Last 3 Encounters:  06/17/12 73 kg (160 lb 15 oz)  06/06/12 86.183 kg (190 lb)    Physical Exam:   General: Alert and oriented x 3, No apparent distress HEENT: Head is normocephalic, atraumatic, PERRLA, EOMI, sclera anicteric, oral mucosa pink and moist, dentition intact, ext ear canals clear,  Neck: Supple without JVD or lymphadenopathy Heart: Reg rate and rhythm. No murmurs rubs or gallops Chest: CTA bilaterally without wheezes, rales, or rhonchi; no distress Abdomen: Soft, mild LLQ, non-distended, bowel sounds positive. Extremities: No clubbing, cyanosis, or edema. Pulses  are 2+ Skin: Clean and intact without signs of breakdown Neuro: Pt is cognitively appropriate with normal insight, memory, and awareness. Cranial nerves 2-12 are intact. Sensory exam is normal. Reflexes are 2+ in all 4's. Fine motor coordination is intact. No tremors. Motor function is grossly 5/5.  Musculoskeletal: Full ROM, No pain with AROM or PROM in the neck, trunk, or extremities. Posture appropriate Psych: Pt's affect is appropriate. Pt is cooperative   Assessment/Plan: 1. Functional deficits secondary to deconditioning after urosepsis and multiple medical complications which require 3+ hours per day of interdisciplinary therapy in a comprehensive inpatient rehab setting. Physiatrist is providing close team supervision and 24 hour management of active medical problems listed below. Physiatrist and rehab team continue to assess barriers to discharge/monitor patient progress toward functional and medical goals. FIM: FIM - Bathing Bathing Steps Patient Completed: Chest;Right Arm;Left Arm;Abdomen;Right upper leg;Left upper leg;Front perineal area;Buttocks Bathing: 4: Min-Patient completes 8-9 93f 10 parts or 75+ percent  FIM - Upper Body Dressing/Undressing Upper body dressing/undressing steps patient completed: Thread/unthread left bra strap;Thread/unthread right bra strap;Thread/unthread right sleeve of pullover shirt/dresss;Thread/unthread left sleeve of pullover shirt/dress;Put head through opening of pull over shirt/dress;Pull shirt over trunk Upper body dressing/undressing: 4: Min-Patient completed 75 plus % of tasks FIM - Lower Body Dressing/Undressing Lower body dressing/undressing steps patient completed: Thread/unthread right underwear leg;Thread/unthread left underwear leg;Pull underwear up/down;Thread/unthread right pants leg;Thread/unthread left pants leg;Pull pants up/down Lower body dressing/undressing: 4: Min-Patient completed 75 plus % of tasks  FIM - Toileting Toileting  steps completed by patient: Adjust clothing prior to toileting;Performs perineal  hygiene;Adjust clothing after toileting Toileting Assistive Devices: Grab bar or rail for support Toileting: 5: Supervision: Safety issues/verbal cues  FIM - Diplomatic Services operational officer Devices: Grab bars;Walker Toilet Transfers: 5-To toilet/BSC: Supervision (verbal cues/safety issues);5-From toilet/BSC: Supervision (verbal cues/safety issues)  FIM - Banker Devices: Therapist, occupational: 5: Supine > Sit: Supervision (verbal cues/safety issues);5: Chair or W/C > Bed: Supervision (verbal cues/safety issues);5: Bed > Chair or W/C: Supervision (verbal cues/safety issues)  FIM - Locomotion: Wheelchair Distance: 64' with mod-max A for hand placement and UE propulsion sequence to maintain straight navigation and for changing direction; patient tends to propel with very short strokes.   Locomotion: Wheelchair: 0: Activity did not occur FIM - Locomotion: Ambulation Locomotion: Ambulation Assistive Devices: Designer, industrial/product (rollator) Ambulation/Gait Assistance: 5: Supervision Locomotion: Ambulation: 2: Travels 50 - 149 ft with supervision/safety issues  Comprehension Comprehension Mode: Auditory Comprehension: 5-Understands complex 90% of the time/Cues < 10% of the time  Expression Expression Mode: Verbal Expression: 5-Expresses complex 90% of the time/cues < 10% of the time  Social Interaction Social Interaction: 5-Interacts appropriately 90% of the time - Needs monitoring or encouragement for participation or interaction.  Problem Solving Problem Solving: 5-Solves basic problems: With no assist  Memory Memory: 5-Recognizes or recalls 90% of the time/requires cueing < 10% of the time  Medical Problem List and Plan:  1. DVT Prophylaxis/Anticoagulation: Pharmaceutical: Pradexa continue 2. Pain Management: N/A. Continue neurontin for diabetic  neuropathy.  3. Mood: alert and appropriate. Will monitor for now.  4. Neuropsych: This patient is capable of making decisions on his/her own behalf.  5. Acute on chronic CHF: daily weights. Low salt diet. Monitor for signs of fluid overload.  6. Atrial Fibrillation: Monitor HR with bid checks. Continue amiodarone, cardizem and pradaxa. Monitor H/H closely.  Intermittent elevated HR, cardizem often held for BP reasons, ask cards for other suggestions            7. Hypothyroid: On supplement.  8. E. Coli UTI: finished abx but has elevated WBCs will recheck UA 9. DM type 2 with peripheral neuropathy: monitor BS with AC/HS cbg checks. Glucotrol on hold currently. Monitor po intake and will resume if BS on upward trend.  10. Diarrhea: Continue probiotic. C-diff negative low dose immodium check stool guaic, OB positive  GI consult- antibiotic associated diarrhea with either hemorrhoidal or small diverticular bleeding -minor will monitor  11. ABLA:Stable H/H has varied from 10.5-12.4 range. Will monitor especially with pradaxa resumed. 12. Insomnia: D/C ambien. Family to provide home RX sonata which is very effective 12.  Hypokalemia due to lasix +/- diarrhea will increase KCL  LOS (Days) 11 A FACE TO FACE EVALUATION WAS PERFORMED  Elouise Divelbiss E 06/17/2012, 8:43 AM

## 2012-06-17 NOTE — Progress Notes (Signed)
Physical Therapy Note  Patient Details  Name: [name] MRN: [id number] Date of Birth: 1924/08/17 Today's Date: 06/17/2012  8:35-9:30 individual therapy pt denied pain.  Pt requested to get dressed before leaving the room. Performed home environment gait in room with rollator walker and minguard assist with vc to lock brake on walker while reaching into closet. Pt donned clothes in sitting with min guard assist for standing and assist to latch bra and to don socks and shoes.. Pt sounded to be SOB. O2 on RA 94%. BP after activity 107/67. Gait with rollator walker 2 x 75' minguard assist. 4 steps with 1 rail min assist. Pt had difficulty today leading with left due to pain in knee. Pt had fall in room and hit her knee.  [name] 06/17/2012, 9:06 AM

## 2012-06-17 NOTE — Progress Notes (Signed)
Patient's daughter, Liliane Shi, notified of patient's fall.  She was calm and thanked me for calling her about the fall.  States, "I will call her later to check on her."

## 2012-06-17 NOTE — Progress Notes (Signed)
Occupational Therapy Session Note  Patient Details  Name: [name] MRN: [id number] Date of Birth: [birth year]  Today's Date: 06/17/2012 Time: 1035-1130 and 0865-7846 Time Calculation (min): 55 min and 30 min  Short Term Goals: Week 2:  OT Short Term Goal 1 (Week 2): STG = LTGs due to remaining short LOS.  Supervision level overall  Skilled Therapeutic Interventions/Progress Updates:    1) Pt seen for ADL retraining at sink per pt's request.  Pt completed UB bathing with setup assist and LB dressing with min/steady assist.  Pt with increased BLE weakness and hesitancy in standing post fall last night in bathroom.  Pt reports pain in Lt knee, but premedicated and willing to participate in therapy.  Ambulated to ADL apartment with Rolator walker.  Engaged in simulated simple meal prep with obtaining items from cabinets and refrigerator with min/steady assist.  Pt with ?memory deficits with difficulty locating items in kitchen after just removing items from same cabinet and inability to locate refrigerator.  Pt more internally distracted this session.  2) 1:1 OT with focus on bed mobility, functional mobility with Rolator walker, and increased activity tolerance/endurance.  Pt min assist bed mobility secondary to c/o pain and stiffness in Lt knee.  Pt requesting exercises/activities to decrease stiffness.  Ambulated with Rolator walker to therapy gym with light min/steady assist.  Pt completed 8 mins on Nustep, 5 mins on resistance level 3 followed by a 2 min rest break and then 3 mins on resistance level 5.  Pt maintained step speed 35-43/minute.  Pt reports increased flexion in Lt knee.  Pt tolerated activity with 1 rest break with O2 93% and HR mid 90s.    Therapy Documentation Precautions:  Precautions Precautions: Fall Precaution Comments: diarrhea Restrictions Weight Bearing Restrictions: No Other Position/Activity Restrictions: lightheadedness with steps. pt has irregular  HR General:   Vital Signs: Therapy Vitals Pulse Rate: 96  BP: 100/65 mmHg Pain:   Pt with c/o pain in Lt knee, premedicated.  Repositioned in bed at end of session.  See FIM for current functional status  Therapy/Group: Individual Therapy  Leonette Monarch 06/17/2012, 11:31 AM

## 2012-06-18 ENCOUNTER — Inpatient Hospital Stay (HOSPITAL_COMMUNITY): Payer: Medicare Other | Admitting: Occupational Therapy

## 2012-06-18 ENCOUNTER — Inpatient Hospital Stay (HOSPITAL_COMMUNITY): Payer: Medicare Other

## 2012-06-18 DIAGNOSIS — R5381 Other malaise: Secondary | ICD-10-CM

## 2012-06-18 DIAGNOSIS — A4189 Other specified sepsis: Secondary | ICD-10-CM

## 2012-06-18 DIAGNOSIS — Z5189 Encounter for other specified aftercare: Secondary | ICD-10-CM

## 2012-06-18 LAB — URINE CULTURE

## 2012-06-18 LAB — CBC
HCT: 33.5 % — ABNORMAL LOW (ref 36.0–46.0)
MCHC: 33.4 g/dL (ref 30.0–36.0)
Platelets: 254 10*3/uL (ref 150–400)
RDW: 16.1 % — ABNORMAL HIGH (ref 11.5–15.5)
WBC: 9.6 10*3/uL (ref 4.0–10.5)

## 2012-06-18 LAB — OCCULT BLOOD X 1 CARD TO LAB, STOOL: Fecal Occult Bld: POSITIVE

## 2012-06-18 LAB — GLUCOSE, CAPILLARY: Glucose-Capillary: 123 mg/dL — ABNORMAL HIGH (ref 70–99)

## 2012-06-18 NOTE — Progress Notes (Signed)
Occupational Therapy Session Note  Patient Details  Name: [name] MRN: [id number] Date of Birth: [birth year]  Today's Date: 06/18/2012 Time: 1100-1146 and 1400-1445 Time Calculation (min): 46 min and 45 min  Short Term Goals: Week 2:  OT Short Term Goal 1 (Week 2): STG = LTGs due to remaining short LOS.  Supervision level overall  Skilled Therapeutic Interventions/Progress Updates:    1) Pt seen for ADL retraining at sink per pt's request.  Pt completed bathing and dressing at supervision level overall with exception of requiring assist to hook bra.  Pt required multiple rest breaks, reporting increased fatigue this session.  Pt completed grooming seated sink, reporting fatigue.  Pt with minimal c/o pain in Lt knee when ambulating.  Engaged in functional mobility in room with Rolator walker with close supervision.  Pt requesting to rest towards end of session reporting "too tired" to engage in additional therapy at this time.  Pt setup in recliner and reapplied ice to Lt knee.  2) 1:1 OT with focus on activity tolerance/endurance, standing tolerance, and functional mobility. Pt ambulated with Rolator walker to therapy gym with close supervision. Completed BUE activity in standing with focus on weight shifting and standing tolerance.  Pt required increased verbal cues for sequencing of novel task with ?cognitive deficits.  Pt with decreased carryover during task.  Required multiple rest breaks throughout standing activity.  Pt ambulated back to room with supervision.  Reported needing to toilet, close supervision with turning to sit on toilet.  Pt supervision with toileting tasks (hygiene and clothing management).  Requested to return to bed.  Therapy Documentation Precautions:  Precautions Precautions: Fall Precaution Comments: diarrhea Restrictions Weight Bearing Restrictions: No Other Position/Activity Restrictions: lightheadedness with steps. pt has irregular  HR General: General Amount of Missed OT Time (min): 14 Minutes Vital Signs: Therapy Vitals Pulse Rate: 90  BP: 128/78 mmHg Pain: Pain Assessment Pain Assessment: No/denies pain  See FIM for current functional status  Therapy/Group: Individual Therapy  Leonette Monarch 06/18/2012, 11:47 AM

## 2012-06-18 NOTE — Progress Notes (Signed)
Nursing Note: Pt has been up to [facility] x2 and unable to void despite feeling quite full.A; Baldder scan revaeled only 57 ml.Pt assisted back to bed.wbb

## 2012-06-18 NOTE — Progress Notes (Signed)
Patient ID: [name], female   DOB: April 12, 1924, 78 y.o.   MRN: [id number] Subjective/Complaints: Bowel inc and diarrhea improved. Up at night with bladder freq.  No burning  Appreciate GI note "can I go home soon?"  A 12 point review of systems has been performed and if not noted above is otherwise negative.   Objective: Vital Signs: Blood pressure 102/62, pulse 112, temperature 99 F (37.2 C), temperature source Oral, resp. rate 18, height 5\' 7"  (1.702 m), weight 72 kg (158 lb 11.7 oz), SpO2 96.00%. Dg Knee 1-2 Views Left  06/17/2012  *RADIOLOGY REPORT*  Clinical Data: Knee pain after fall.  LEFT KNEE - 1-2 VIEW  Comparison: None.  Findings: Left total knee arthroplasty.  Components appear well seated.  No evidence of acute fracture or dislocation of the left knee.  No significant effusion.  No focal bone lesion or bone destruction.  Extensive vascular calcifications.  IMPRESSION: Left knee arthroplasty.  No acute fractures.  Original Report Authenticated By: Marlon Pel, M.D.    Basename 06/17/12 0455 06/16/12 0714  WBC 12.9* 9.9  HGB 11.8* 11.7*  HCT 36.4 35.7*  PLT 328 299   No results found for this basename: NA:2,K:2,CL:2,CO2:2,GLUCOSE:2,BUN:2,CREATININE:2,CALCIUM:2 in the last 72 hours CBG (last 3)   Basename 06/18/12 0658 06/17/12 2123 06/17/12 1626  GLUCAP 110* 119* 119*    Wt Readings from Last 3 Encounters:  06/18/12 72 kg (158 lb 11.7 oz)  06/06/12 86.183 kg (190 lb)    Physical Exam:   General: Alert and oriented x 3, No apparent distress HEENT: Head is normocephalic, atraumatic, PERRLA, EOMI, sclera anicteric, oral mucosa pink and moist, dentition intact, ext ear canals clear,  Neck: Supple without JVD or lymphadenopathy Heart: Reg rate and rhythm. No murmurs rubs or gallops Chest: CTA bilaterally without wheezes, rales, or rhonchi; no distress Abdomen: Soft, mild LLQ, non-distended, bowel sounds positive. Extremities: No clubbing, cyanosis, or  edema. Pulses are 2+ Skin: Clean and intact without signs of breakdown Neuro: Pt is cognitively appropriate with normal insight, memory, and awareness. Cranial nerves 2-12 are intact. Sensory exam is normal. Reflexes are 2+ in all 4's. Fine motor coordination is intact. No tremors. Motor function is grossly 5/5.  Musculoskeletal: Full ROM, No pain with AROM or PROM in the neck, trunk, or extremities. Posture appropriate Psych: Pt's affect is appropriate. Pt is cooperative   Assessment/Plan: 1. Functional deficits secondary to deconditioning after urosepsis and multiple medical complications which require 3+ hours per day of interdisciplinary therapy in a comprehensive inpatient rehab setting. Physiatrist is providing close team supervision and 24 hour management of active medical problems listed below. Plan D/C in am FIM: FIM - Bathing Bathing Steps Patient Completed: Chest;Right Arm;Left Arm;Abdomen;Front perineal area;Buttocks;Right upper leg;Left upper leg Bathing: 4: Min-Patient completes 8-9 23f 10 parts or 75+ percent  FIM - Upper Body Dressing/Undressing Upper body dressing/undressing steps patient completed: Thread/unthread right sleeve of pullover shirt/dresss;Thread/unthread left sleeve of pullover shirt/dress;Put head through opening of pull over shirt/dress;Pull shirt over trunk;Thread/unthread right bra strap;Thread/unthread left bra strap Upper body dressing/undressing: 4: Min-Patient completed 75 plus % of tasks FIM - Lower Body Dressing/Undressing Lower body dressing/undressing steps patient completed: Pull underwear up/down;Thread/unthread right underwear leg;Thread/unthread left underwear leg;Thread/unthread right pants leg;Thread/unthread left pants leg;Pull pants up/down;Don/Doff right shoe;Don/Doff left shoe Lower body dressing/undressing: 4: Steadying Assist  FIM - Toileting Toileting steps completed by patient: Adjust clothing prior to toileting;Performs perineal  hygiene;Adjust clothing after toileting Toileting Assistive Devices: Grab bar or rail  for support Toileting: 5: Supervision: Safety issues/verbal cues  FIM - Diplomatic Services operational officer Devices: Grab bars;Walker Toilet Transfers: 5-To toilet/BSC: Supervision (verbal cues/safety issues);5-From toilet/BSC: Supervision (verbal cues/safety issues)  FIM - Banker Devices: Bed rails Bed/Chair Transfer: 5: Sit > Supine: Supervision (verbal cues/safety issues);5: Chair or W/C > Bed: Supervision (verbal cues/safety issues)  FIM - Locomotion: Wheelchair Distance: 77' with mod-max A for hand placement and UE propulsion sequence to maintain straight navigation and for changing direction; patient tends to propel with very short strokes.   Locomotion: Wheelchair: 0: Activity did not occur FIM - Locomotion: Ambulation Locomotion: Ambulation Assistive Devices: Designer, industrial/product (rollator) Ambulation/Gait Assistance: 5: Supervision Locomotion: Ambulation: 2: Travels 50 - 149 ft with supervision/safety issues  Comprehension Comprehension Mode: Auditory Comprehension: 5-Understands complex 90% of the time/Cues < 10% of the time  Expression Expression Mode: Verbal Expression: 5-Expresses complex 90% of the time/cues < 10% of the time  Social Interaction Social Interaction: 5-Interacts appropriately 90% of the time - Needs monitoring or encouragement for participation or interaction.  Problem Solving Problem Solving: 5-Solves basic problems: With no assist  Memory Memory: 5-Recognizes or recalls 90% of the time/requires cueing < 10% of the time  Medical Problem List and Plan:  1. DVT Prophylaxis/Anticoagulation: Pharmaceutical: Pradexa continue 2. Pain Management: N/A. Continue neurontin for diabetic neuropathy.  3. Mood: alert and appropriate. Will monitor for now.  4. Neuropsych: This patient is capable of making decisions on his/her own  behalf.  5. Acute on chronic CHF: daily weights. Low salt diet. Monitor for signs of fluid overload.  6. Atrial Fibrillation: Monitor HR with bid checks. Continue amiodarone, cardizem and pradaxa. Monitor H/H closely.  Intermittent elevated HR, cardizem often held for BP reasons, ask cards for other suggestions            7. Hypothyroid: On supplement.  8. E. Coli UTI: finished abx but has elevated WBCs  recheck UA-,afeb 9. DM type 2 with peripheral neuropathy: monitor BS with AC/HS cbg checks. Glucotrol on hold currently. Monitor po intake and will resume if BS on upward trend.  10. Diarrhea: Continue probiotic. C-diff negative low dose immodium check stool guaic, OB positive  GI consult- antibiotic associated diarrhea with either hemorrhoidal or small diverticular bleeding -minor will monitor  11. ABLA:Stable H/H has varied from 10.5-12.4 range. Will monitor especially with pradaxa resumed. 12. Insomnia: D/C ambien. Family to provide home RX sonata which is very effective 12.  Hypokalemia due to lasix +/- diarrhea will increase KCL  LOS (Days) 12 A FACE TO FACE EVALUATION WAS PERFORMED  KIRSTEINS,ANDREW E 06/18/2012, 7:16 AM

## 2012-06-18 NOTE — Progress Notes (Signed)
Physical Therapy Session Note  Patient Details  Name: [name] MRN: [id number] Date of Birth: 04-01-1924  Today's Date: 06/18/2012 Time: 0805-0900 and 1030-1100 Time Calculation (min): 55 min and 30 min  Short Term Goals:  Week 2: same as LTGs    Skilled Therapeutic Interventions/Progress Updates:   1st tx  Sitting balance activity donning TEDs and shoes in w/c; mod asssit to don TEDs; supervision for balance. Stood at sink for Special educational needs teacher with supervision.  Gait training with Rollator walker x 100' , x 180' with supervision, on tile and carpet in apartment.  Stand> sit on bed without cues, appropriately using brakes of walker. Pt stopped and used seat of Rollator during longer distance; with supervision to sit; with min assist to stand from seat, which is 21" from floor.  Bed mobility with modified independence for rolling, sit> supine; 1 Vc needed for supine > sit to bend knees.  Therapeutic exercise performed with bil LE to increase strength for functional mobility 10 x 1 each:  bridging, minimal abdominal crunches 10 x 1 for trunk activation, calf raises with mod assist for balance, closed chain hip abduction Land R with bil UE support. .  Gait with supervision on 6 steps, L rail ascending with bil hands on rail, with VCs for sequencing due to L knee pain with bending due to fall 06/16/12.  2nd tx  Pt fatigued, lying in bed.  Bedside therapeutic exercise performed with bil LE to increase strength for functional mobility: R and L straight leg raises, bridging, alternating ankle pumps, L and R hip abduction in sidelying .  Gait in room with Rollator walker, with supervision to/from toilet,sink and recliner.    Toilet transfer with supervision, 3-in-1 raised up 1" over toilet.  Pt continent of bladder, slightly incontinent of bowel, in brief.  Hygiene with supervision.  Ice to L knee due to discomfort after tx.        Therapy Documentation Precautions:    Precautions Precautions: Fall Precaution Comments: diarrhea Restrictions Weight Bearing Restrictions: No Other Position/Activity Restrictions: lightheadedness with steps. pt has irregular HR   Pain: Pain Assessment Pain Assessment: No/denies pain at rest; L knee painful with flexion of gait and transfers        See FIM for current functional status  Therapy/Group: Individual Therapy  Dontavious Emily 06/18/2012, 12:23 PM

## 2012-06-19 ENCOUNTER — Inpatient Hospital Stay (HOSPITAL_COMMUNITY): Payer: Medicare Other | Admitting: Occupational Therapy

## 2012-06-19 ENCOUNTER — Inpatient Hospital Stay (HOSPITAL_COMMUNITY): Payer: Medicare Other | Admitting: Physical Therapy

## 2012-06-19 LAB — OCCULT BLOOD X 1 CARD TO LAB, STOOL: Fecal Occult Bld: POSITIVE

## 2012-06-19 LAB — GLUCOSE, CAPILLARY: Glucose-Capillary: 172 mg/dL — ABNORMAL HIGH (ref 70–99)

## 2012-06-19 LAB — CBC
HCT: 34.6 % — ABNORMAL LOW (ref 36.0–46.0)
Hemoglobin: 11.3 g/dL — ABNORMAL LOW (ref 12.0–15.0)
MCH: 32.4 pg (ref 26.0–34.0)
MCHC: 32.7 g/dL (ref 30.0–36.0)
MCV: 99.1 fL (ref 78.0–100.0)
RBC: 3.49 MIL/uL — ABNORMAL LOW (ref 3.87–5.11)

## 2012-06-19 MED ORDER — OXYBUTYNIN CHLORIDE 5 MG PO TABS
2.5000 mg | ORAL_TABLET | Freq: Every day | ORAL | Status: DC
  Administered 2012-06-19: 2.5 mg via ORAL
  Filled 2012-06-19 (×2): qty 0.5

## 2012-06-19 NOTE — Progress Notes (Signed)
Occupational Therapy Session Note  Patient Details  Name: [name] MRN: [id number] Date of Birth: Jan 02, 1924  Today's Date: 06/19/2012 Time: 0815-0900 and 1135-1200 Time Calculation (min): 45 min and 25 min  Short Term Goals: Week 2:  OT Short Term Goal 1 (Week 2): STG = LTGs due to remaining short LOS.  Supervision level overall  Skilled Therapeutic Interventions/Progress Updates:    1) Pt seen for ADL retraining at sink per pt's request.  Pt says she plans to return to showering at home in her own shower.  Pt's daughter would provide hand held assist as she would step over ledge into walk-in shower and then would stay in the bathroom while pt bathed.  Pt completed bathing and dressing at overall supervision level with cue to hook bra in front to increase independence with UB dressing.  2) 1:1 OT with focus on bed mobility, toilet transfers, and simulated walk-in shower transfer.  Pt reported needing to toilet upon arrival.  Toilet transfer and toileting (hygiene and clothing management) were supervision overall.  Walk-in shower transfer with hand held assist and sidestepping over 3" ledge.  Pt reports having a shower chair, but not using it PTA.  Educated pt on energy conservation and use of shower chair when feeling SOB or increased fatigue. Pt reporting ready for D/C home tomorrow.  Therapy Documentation Precautions:  Precautions Precautions: Fall Precaution Comments: diarrhea Restrictions Weight Bearing Restrictions: No Other Position/Activity Restrictions: lightheadedness with steps. pt has irregular HR Pain: Pain Assessment Pain Assessment: No/denies pain ADL: ADL Equipment Provided: Long-handled sponge (elastic shoelaces) Grooming: Independent Where Assessed-Grooming: Sitting at sink Upper Body Bathing: Setup Where Assessed-Upper Body Bathing: Sitting at sink Lower Body Bathing: Setup Where Assessed-Lower Body Bathing: Sitting at sink Upper Body Dressing:  Setup Where Assessed-Upper Body Dressing: Sitting at sink Lower Body Dressing: Setup Where Assessed-Lower Body Dressing: Sitting at sink;Standing at sink Toileting: Supervision/safety Where Assessed-Toileting: Teacher, adult education: Close supervision Toilet Transfer Method: Stand pivot;Ambulating (with Rolator walker) Acupuncturist: Acupuncturist: Insurance underwriter Method: Designer, industrial/product: Shower seat with back;Grab bars  See FIM for current functional status  Therapy/Group: Individual Therapy  Leonette Monarch 06/19/2012, 9:00 AM

## 2012-06-19 NOTE — Progress Notes (Signed)
Patient ID: [name], female   DOB: 09/21/1924, 78 y.o.   MRN: [id number] Subjective/Complaints: Bowel inc and diarrhea improved. Up at night with bladder freq.  No burning  Appreciate GI note "can I go home soon?" Scheduled for D/C  A 12 point review of systems has been performed and if not noted above is otherwise negative.   Objective: Vital Signs: Blood pressure 120/72, pulse 94, temperature 98.3 F (36.8 C), temperature source Oral, resp. rate 19, height 5\' 7"  (1.702 m), weight 72 kg (158 lb 11.7 oz), SpO2 95.00%. No results found.  Basename 06/19/12 0623 06/18/12 0710  WBC 8.7 9.6  HGB 11.3* 11.2*  HCT 34.6* 33.5*  PLT 279 254   No results found for this basename: NA:2,K:2,CL:2,CO2:2,GLUCOSE:2,BUN:2,CREATININE:2,CALCIUM:2 in the last 72 hours CBG (last 3)   Basename 06/19/12 0703 06/18/12 2104 06/18/12 1609  GLUCAP 131* 148* 123*    Wt Readings from Last 3 Encounters:  06/18/12 72 kg (158 lb 11.7 oz)  06/06/12 86.183 kg (190 lb)    Physical Exam:   General: Alert and oriented x 3, No apparent distress HEENT: Head is normocephalic, atraumatic, PERRLA, EOMI, sclera anicteric, oral mucosa pink and moist, dentition intact, ext ear canals clear,  Neck: Supple without JVD or lymphadenopathy Heart: Reg rate and rhythm. No murmurs rubs or gallops Chest: CTA bilaterally without wheezes, rales, or rhonchi; no distress Abdomen: Soft, mild LLQ, non-distended, bowel sounds positive. Extremities: No clubbing, cyanosis, or edema. Pulses are 2+ Skin: Clean and intact without signs of breakdown Neuro: Pt is cognitively appropriate with normal insight, memory, and awareness. Cranial nerves 2-12 are intact. Sensory exam is normal. Reflexes are 2+ in all 4's. Fine motor coordination is intact. No tremors. Motor function is grossly 5/5.  Musculoskeletal: Full ROM, No pain with AROM or PROM in the neck, trunk, or extremities. Posture appropriate Psych: Pt's affect is appropriate.  Pt is cooperative   Assessment/Plan: 1. Functional deficits secondary to deconditioning after urosepsis and multiple medical complications  Stable for D/C  FIM: FIM - Bathing Bathing Steps Patient Completed: Chest;Right Arm;Left Arm;Abdomen;Front perineal area;Buttocks;Right upper leg;Left upper leg;Right lower leg (including foot);Left lower leg (including foot) Bathing: 5: Set-up assist to: Obtain items  FIM - Upper Body Dressing/Undressing Upper body dressing/undressing steps patient completed: Thread/unthread right bra strap;Thread/unthread left bra strap;Hook/unhook bra;Thread/unthread right sleeve of pullover shirt/dresss;Thread/unthread left sleeve of pullover shirt/dress;Put head through opening of pull over shirt/dress;Pull shirt over trunk Upper body dressing/undressing: 5: Set-up assist to: Obtain clothing/put away FIM - Lower Body Dressing/Undressing Lower body dressing/undressing steps patient completed: Thread/unthread right underwear leg;Thread/unthread left underwear leg;Pull underwear up/down;Thread/unthread right pants leg;Thread/unthread left pants leg;Pull pants up/down;Don/Doff left sock;Don/Doff right shoe Lower body dressing/undressing: 5: Set-up assist to: Don/Doff TED stocking  FIM - Toileting Toileting steps completed by patient: Adjust clothing prior to toileting;Performs perineal hygiene;Adjust clothing after toileting Toileting Assistive Devices: Grab bar or rail for support Toileting: 5: Supervision: Safety issues/verbal cues  FIM - Diplomatic Services operational officer Devices: Grab bars;Walker Toilet Transfers: 5-To toilet/BSC: Supervision (verbal cues/safety issues);5-From toilet/BSC: Supervision (verbal cues/safety issues)  FIM - Banker Devices: Bed rails Bed/Chair Transfer: 5: Bed > Chair or W/C: Supervision (verbal cues/safety issues)  FIM - Locomotion: Wheelchair Distance: 55' with mod-max A for hand  placement and UE propulsion sequence to maintain straight navigation and for changing direction; patient tends to propel with very short strokes.   Locomotion: Wheelchair: 0: Activity did not occur FIM - Locomotion: Ambulation  Locomotion: Ambulation Assistive Devices: Designer, industrial/product (rollator) Ambulation/Gait Assistance: 5: Supervision Locomotion: Ambulation: 5: Travels 150 ft or more with supervision/safety issues  Comprehension Comprehension Mode: Auditory Comprehension: 5-Understands complex 90% of the time/Cues < 10% of the time  Expression Expression Mode: Verbal Expression: 5-Expresses complex 90% of the time/cues < 10% of the time  Social Interaction Social Interaction: 5-Interacts appropriately 90% of the time - Needs monitoring or encouragement for participation or interaction.  Problem Solving Problem Solving: 5-Solves basic problems: With no assist  Memory Memory: 5-Recognizes or recalls 90% of the time/requires cueing < 10% of the time  Medical Problem List and Plan:  1. DVT Prophylaxis/Anticoagulation: Pharmaceutical: Pradexa continue 2. Pain Management: N/A. Continue neurontin for diabetic neuropathy.  3. Mood: alert and appropriate. Will monitor for now.  4. Neuropsych: This patient is capable of making decisions on his/her own behalf.  5. Acute on chronic CHF: daily weights. Low salt diet. Monitor for signs of fluid overload.  6. Atrial Fibrillation: Monitor HR with bid checks. Continue amiodarone, cardizem and pradaxa. Monitor H/H closely.  Intermittent elevated HR, cardizem often held for BP reasons, ask cards for other suggestions            Hypothyroid: On supplement.  8. E. Coli UTI: finished abx but has elevated WBCs  recheck UA-,afeb 9. DM type 2 with peripheral neuropathy: monitor BS with AC/HS cbg checks. Glucotrol on hold currently. Monitor po intake and will resume if BS on upward trend.  10. Diarrhea: Continue probiotic. C-diff negative low dose immodium  check stool guaic, OB positive  GI consult- antibiotic associated diarrhea with either hemorrhoidal or small diverticular bleeding -minor will monitor  11. ABLA:Stable H/H has varied from 10.5-12.4 range. Will monitor especially with pradaxa resumed. 12. Insomnia: D/C ambien. Family to provide home RX sonata which is very effective   LOS (Days) 13 A FACE TO FACE EVALUATION WAS PERFORMED  [name] E 06/19/2012, 10:16 AM

## 2012-06-19 NOTE — Progress Notes (Signed)
Physical Therapy Note  Patient Details  Name: [name] MRN: [id number] Date of Birth: 11-Mar-1924 Today's Date: 06/19/2012  13:30-14:30 individual therapy pt denied pain.  Performed wc mobility x 100' supervision for endurance , 12 steps with 2 rails with min assist and vc for sequence and foot position to decrease pain in left knee, gait in community over grass, curb, and ramp all min assist x 100', repeated TUG with 1 second improvement from 50 sec. To 49 sec. Unable to repeat BERG due to time and fatigue. Bed mobility mod I.  [name] 06/19/2012, 2:28 PM

## 2012-06-19 NOTE — Progress Notes (Signed)
Occupational Therapy Discharge Summary  Patient Details  Name: [name] MRN: [id number] Date of Birth: 1923-12-18  Today's Date: 06/19/2012  Patient has met 7 of 8 long term goals due to improved activity tolerance, improved balance and ability to compensate for deficits.  Patient to discharge at overall Supervision level.  Patient's care partner is independent to provide the necessary physical and cognitive assistance at discharge.  Patient occasionally requires min cues for sequencing of self-care tasks.  Pt's daughter, [name], reports that she provided hand held assist with walk-in shower transfer and plans to continue that upon D/C.  Pt has hired caregiver who provided supervision assist PTA and will return upon D/C.  Reasons goals not met: Pt min assist with walk-in shower transfer secondary to decreased balance with stepping over ledge.  Pt requires hand held assist for transfer.  Pt's daughter, [name], able to provide assist with shower transfers.  Recommendation:  Patient will benefit from ongoing skilled OT services in home health setting to continue to advance functional skills in the area of BADL and Reduce care partner burden.  Equipment: No equipment provided Pt already has necessary equipment.  Reasons for discharge: treatment goals met and discharge from hospital  Patient/family agrees with progress made and goals achieved: Yes  OT Discharge Precautions/Restrictions  Precautions Precautions: Fall Restrictions Weight Bearing Restrictions: No Other Position/Activity Restrictions: continues with irregular HR General   Vital Signs Therapy Vitals Pulse Rate: 94  BP: 120/72 mmHg Pain Pain Assessment Pain Assessment: 0-10 Pain Score: 0-No pain Pain Type: Acute pain Pain Location: Knee Pain Orientation: Left Pain Descriptors: Aching Pain Frequency: Constant Pain Onset: On-going Patients Stated Pain Goal: 3 Pain Intervention(s): Medication (See  eMAR);Repositioned Multiple Pain Sites: No ADL ADL Equipment Provided: Long-handled sponge (elastic shoelaces) Grooming: Independent Where Assessed-Grooming: Sitting at sink Upper Body Bathing: Setup Where Assessed-Upper Body Bathing: Sitting at sink Lower Body Bathing: Setup Where Assessed-Lower Body Bathing: Sitting at sink Upper Body Dressing: Setup Where Assessed-Upper Body Dressing: Sitting at sink Lower Body Dressing: Setup Where Assessed-Lower Body Dressing: Sitting at sink;Standing at sink Toileting: Supervision/safety Where Assessed-Toileting: Teacher, adult education: Close supervision Toilet Transfer Method: Stand pivot;Ambulating (with Rolator walker) Acupuncturist: Acupuncturist: Insurance underwriter Method: Designer, industrial/product: Information systems manager with back;Grab bars Vision/Perception  Vision - History Baseline Vision: Wears glasses all the time Patient Visual Report: No change from baseline Vision - Assessment Eye Alignment: Within Functional Limits Vision Assessment: Vision not tested Perception Perception: Within Functional Limits Praxis Praxis: Impaired Praxis Impairment Details: Motor planning Praxis-Other Comments: requires increased time  Cognition Overall Cognitive Status: Impaired at baseline Memory: Impaired Memory Impairment: Decreased recall of new information Awareness: Appears intact Problem Solving Impairment: Functional complex Executive Function:  (pathfinding) Sequencing: Appears intact Safety/Judgment: Appears intact Sensation Sensation Light Touch: Impaired by gross assessment (no change) Motor  Motor Motor: Within Functional Limits Motor - Discharge Observations: generalized weakness and deconditioning. Mobility  Bed Mobility Rolling Right: 6: Modified independent (Device/Increase time) Rolling Left: 6: Modified independent (Device/Increase time) Left Sidelying to  Sit: 6: Modified independent (Device/Increase time) Supine to Sit: 6: Modified independent (Device/Increase time) Supine to Sit Details (indicate cue type and reason): increased time needed for all activity. Sit to Supine: 6: Modified independent (Device/Increase time) Transfers Sit to Stand: 5: Supervision;With armrests;From bed;From chair/3-in-1;From toilet Stand to Sit: 5: Supervision  Trunk/Postural Assessment  Cervical Assessment Cervical Assessment: Within Functional Limits Thoracic Assessment Thoracic Assessment: Within Functional Limits Lumbar  Assessment Lumbar Assessment: Within Functional Limits Postural Control Postural Control: Within Functional Limits  Balance   Extremity/Trunk Assessment RUE Assessment RUE Assessment: Within Functional Limits LUE Assessment LUE Assessment: Within Functional Limits  See FIM for current functional status  Leonette Monarch 06/19/2012, 12:10 PM

## 2012-06-19 NOTE — Progress Notes (Signed)
Placed pt. On CPAP via pt nasal home mask, auto titrate setting (7.0 min, 14.0 max) with 2 lpm O2 bleed in. Pt. Tolerating well at this time.

## 2012-06-19 NOTE — Patient Care Conference (Signed)
Inpatient RehabilitationTeam Conference Note Date: 06/19/2012   Time: 11:07 AM    Patient Name: [name]      Medical Record Number: [id number]  Date of Birth: Jul 27, 1924 Sex: Female         Room/Bed: 4147/4147-01 Payor Info: Payor: MEDICARE  Plan: MEDICARE PART A AND B  Product Type: *No Product type*     Admitting Diagnosis: DECONDITIONED  Admit Date/Time:  06/06/2012  5:57 PM Admission Comments: No comment available   Primary Diagnosis:  Physical deconditioning Principal Problem: Physical deconditioning  Patient Active Problem List   Diagnosis Date Noted  . Physical deconditioning 06/06/2012  . Bloody diarrhea 06/04/2012  . Acute respiratory failure with hypoxia 06/04/2012  . Hypotension 06/04/2012  . OSA (obstructive sleep apnea) 06/04/2012  . Pulmonary edema 06/04/2012  . Leukocytosis 06/04/2012  . Sepsis 05/31/2012  . Ischemic colitis 05/31/2012  . C. difficile colitis 05/31/2012  . Chest pain 05/29/2012  . Vomiting 05/29/2012  . Constipation 05/29/2012  . Hypokalemia 05/29/2012  . Hyponatremia 05/29/2012  . E. coli UTI 05/29/2012  . Atrial fibrillation with RVR   . Diastolic heart failure, NYHA class 1   . Hypothyroidism   . Asthma     Expected Discharge Date: Expected Discharge Date: 06/20/12  Team Members Present: Physician: Dr. Claudette Laws Social Worker Present: Dossie Der, LCSW Nurse Present: Rosalio Macadamia, RN PT Present: Edman Circle, Judith Blonder, PTA OT Present: Leonette Monarch, Felipa Eth, OT SLP Present: Fae Pippin, SLP     Current Status/Progress Goal Weekly Team Focus  Medical   diarrhea improved, incontinence of urine with frequency  initiate anticholinergic at night  improved bladder continence at night   Bowel/Bladder   continent of bowel and bladder with toileting, patient wears depends, occasional incontinence with bowel   continent  with toileting      Swallow/Nutrition/ Hydration             ADL's   setup  bathing, min assist UB dressing, supervision LB dressing, min-supervision transfers  supervision overall  activity tolerance/endurance, dynamic standing balance, d/c planning   Mobility   supervision overall min asssit for steps  supervision overall min asssit for steps and community      Communication             Safety/Cognition/ Behavioral Observations  no unsafe behaviors,    no falls with injury      Pain   pain in left knee and right foot, allopurinal for right foot pain, tylenol 650 mg for left knee pain with good relief  3 or less on scale of 1-10      Skin   blanchable redness to sacrum, barrier cream to area BID, resolving yeast to peri area, nystatin powder to area BID   no new breakdown         *See Interdisciplinary Assessment and Plan and progress notes for long and short-term goals  Barriers to Discharge: none    Possible Resolutions to Barriers:  prepare for discharge on 8/22    Discharge Planning/Teaching Needs:  Home with her 24 hour hired caregivers she had prior to admission.  Feels ready to go home at her level      Team Discussion:  Pt has reached goals and ready for discharge.  GI saw-no c-diff.  Bladder issues CVA related MD addressed with pt.  Revisions to Treatment Plan:  None   Continued Need for Acute Rehabilitation Level of Care: The patient requires daily medical management by a  physician with specialized training in physical medicine and rehabilitation for the following conditions: Daily direction of a multidisciplinary physical rehabilitation program to ensure safe treatment while eliciting the highest outcome that is of practical value to the patient.: Yes Daily medical management of patient stability for increased activity during participation in an intensive rehabilitation regime.: Yes Daily analysis of laboratory values and/or radiology reports with any subsequent need for medication adjustment of medical intervention for : Neurological  problems  Christophr Calix, Lemar Livings 06/20/2012, 11:07 AM

## 2012-06-19 NOTE — Progress Notes (Signed)
Physical Therapy Discharge Summary  Patient Details  Name: [name] MRN: [id number] Date of Birth: Sep 16, 1924  Today's Date: 06/20/2012  Patient has met 10 of 10 long term goals due to improved activity tolerance and increased strength.  Patient to discharge at an ambulatory level Supervision using rollator walker and will require min assist for steps and uneven ground in the community.   Patient's care partner unavailable to provide the necessary supervision. assistance at discharge. Pt has had hired caregivers prior to admission that were assisting at a supervision level. Pt currently is at baseline for mobility so family education was not performed with those caregivers. Pt made a 1 second improvement in gait speed on TUG balance assessment, but continues to demonstrate high fall risk due to slow gait speed, as well in inadequate and delayed balance strategies.    Reasons goals not met-  N/A  Recommendation:  Patient will benefit from ongoing skilled PT services in home health setting to continue to advance safe functional mobility, address ongoing impairments in strength, balance, activity tolerance, muscle timing and sequencing (due to prior CVA)  and minimize fall risk.  Equipment: No equipment provided  Reasons for discharge: LTGs met,  discharge from hospital  Patient/family agrees with progress made and goals achieved: Yes  PT Discharge Precautions/Restrictions Restrictions Weight Bearing Restrictions: No Vital Signs Therapy Vitals Pulse Rate: 92  BP: 98/65 mmHg Pain Pain Assessment Pain Assessment: 0-10 Pain Score: 0-No pain Patients Stated Pain Goal: 3 Multiple Pain Sites: No Vision/Perception     Cognition Orientation Level: Oriented to person;Oriented to place;Oriented to situation Sensation Sensation Light Touch: Impaired by gross assessment (no change) Motor  Motor Motor: Within Functional Limits Motor - Discharge Observations: generalized weakness  and deconditioning.  Mobility Bed Mobility Rolling Right: 6: Modified independent (Device/Increase time) Rolling Left: 6: Modified independent (Device/Increase time) Left Sidelying to Sit: 6: Modified independent (Device/Increase time) Supine to Sit: 6: Modified independent (Device/Increase time) Sit to Supine: 6: Modified independent (Device/Increase time) Transfers Sit to Stand: 5: Supervision;With armrests;From bed;From chair/3-in-1;From toilet Stand to Sit: 5: Supervision Stand Pivot Transfers: 5: Supervision Locomotion  Ambulation Ambulation: Yes Ambulation/Gait Assistance: 5: Supervision Assistive device: 4-wheeled walker Gait Gait: Yes Gait Pattern: Impaired Gait Pattern: Step-through pattern;Narrow base of support;Decreased hip/knee flexion - left;Decreased hip/knee flexion - right;Decreased stride length High Level Ambulation High Level Ambulation: Backwards walking Backwards Walking: slow with rollator walker Stairs / Additional Locomotion Stairs: Yes Stairs Assistance: 4: Min guard Stairs Assistance Details: Verbal cues for technique;Verbal cues for precautions/safety;Manual facilitation for weight shifting Stair Management Technique: One rail Left Ramp: 4: Min assist Curb: 4: Min Technical sales engineer Mobility: No  Trunk/Postural Assessment  Cervical Assessment Cervical Assessment: Within Functional Limits Thoracic Assessment Thoracic Assessment: Within Functional Limits Lumbar Assessment Lumbar Assessment: Within Functional Limits Postural Control Postural Control: Within Functional Limits  Balance Berg on 06/14/12= 23/56 Static Sitting Balance Static Sitting - Level of Assistance: 6: Modified independent (Device/Increase time) Dynamic Sitting Balance Dynamic Sitting - Level of Assistance: 5: Stand by assistance Static Standing Balance Static Standing - Level of Assistance: 5: Stand by assistance Dynamic Standing Balance Dynamic Standing  - Balance Support: Bilateral upper extremity supported Dynamic Standing - Level of Assistance: 5: Stand by assistance Extremity Assessment        LLE Strength LLE Overall Strength Comments: no change  See FIM for current functional status  Kamylah Manzo 06/20/2012, 12:25 PM

## 2012-06-19 NOTE — Progress Notes (Signed)
Social Work Patient ID: [name], female   DOB: 11/22/1923, 78 y.o.   MRN: [id number]  Met with pt to inform team conference and readiness for discharge tomorrow.  Pt is ready and pleased with her progress. She is concerned about her bladder but spoke with MD and got her questions answered.  Will have here caregivers she had prior To admission and also home health.  Ready for discharge tomorrow.

## 2012-06-19 NOTE — Progress Notes (Signed)
Patient reports ongoing issue with mild diarrhea, characterized by several loose stools a day, slightly more than her usual baseline, but nowhere near as bad as it was recently following her course of antibiotics.  Plan for discharge tomorrow noted. Will sign off at this time.  I have advised the patient to contact her primary physician, Dr. Merlene Laughter, or her previous gastroenterologist, Dr. Reece Agar, if her bowel function does not return all the way to normal.  I would recommend continued probiotic therapy with Florastor or any probiotic of choice for the next several weeks to help restore colonic microflora.  Please call me if you have any questions.  [name], M.D. [phone]

## 2012-06-19 NOTE — Progress Notes (Signed)
Physical Therapy Note  Patient Details  Name: [name] MRN: [id number] Date of Birth: June 15, 1924 Today's Date: 06/19/2012  9:30-10:30 individual therapy. Pt denied pain   Performed gait on controlled and home environment supervision 150', dynamic balance to play baseball game with gait including backing and maneuvering around obstacles.  [name] 06/19/2012, 12:57 PM

## 2012-06-20 DIAGNOSIS — Z5189 Encounter for other specified aftercare: Secondary | ICD-10-CM

## 2012-06-20 DIAGNOSIS — A4189 Other specified sepsis: Secondary | ICD-10-CM

## 2012-06-20 DIAGNOSIS — R5381 Other malaise: Secondary | ICD-10-CM

## 2012-06-20 LAB — CBC
MCV: 99.4 fL (ref 78.0–100.0)
Platelets: 290 10*3/uL (ref 150–400)
RBC: 3.6 MIL/uL — ABNORMAL LOW (ref 3.87–5.11)
WBC: 8.5 10*3/uL (ref 4.0–10.5)

## 2012-06-20 LAB — GLUCOSE, CAPILLARY
Glucose-Capillary: 105 mg/dL — ABNORMAL HIGH (ref 70–99)
Glucose-Capillary: 173 mg/dL — ABNORMAL HIGH (ref 70–99)

## 2012-06-20 MED ORDER — POTASSIUM CHLORIDE ER 10 MEQ PO TBCR: EXTENDED_RELEASE_TABLET | ORAL | Status: DC

## 2012-06-20 MED ORDER — POTASSIUM CHLORIDE ER 10 MEQ PO TBCR: 20.0000 meq | EXTENDED_RELEASE_TABLET | Freq: Every day | ORAL | Status: DC

## 2012-06-20 MED ORDER — METOPROLOL SUCCINATE ER 25 MG PO TB24: 12.5000 mg | ORAL_TABLET | Freq: Every day | ORAL | Status: DC

## 2012-06-20 MED ORDER — FAMOTIDINE 20 MG PO TABS: 20.0000 mg | ORAL_TABLET | Freq: Every day | ORAL | Status: DC

## 2012-06-20 MED ORDER — FUROSEMIDE 40 MG PO TABS
40.0000 mg | ORAL_TABLET | Freq: Every day | ORAL | Status: DC
  Filled 2012-06-20: qty 1

## 2012-06-20 MED ORDER — FUROSEMIDE 80 MG PO TABS: 40.0000 mg | ORAL_TABLET | Freq: Every day | ORAL | Status: DC

## 2012-06-20 MED ORDER — OXYBUTYNIN CHLORIDE 5 MG PO TABS: 2.5000 mg | ORAL_TABLET | Freq: Every day | ORAL | Status: DC

## 2012-06-20 MED ORDER — POTASSIUM CHLORIDE CRYS ER 20 MEQ PO TBCR: 20.0000 meq | EXTENDED_RELEASE_TABLET | Freq: Every day | ORAL | Status: DC

## 2012-06-20 MED ORDER — SACCHAROMYCES BOULARDII 250 MG PO CAPS: 250.0000 mg | ORAL_CAPSULE | Freq: Two times a day (BID) | ORAL | Status: AC

## 2012-06-20 MED ORDER — AMIODARONE HCL 200 MG PO TABS: 200.0000 mg | ORAL_TABLET | Freq: Every day | ORAL | Status: DC

## 2012-06-20 MED ORDER — CALCIUM POLYCARBOPHIL 625 MG PO TABS: 1250.0000 mg | ORAL_TABLET | Freq: Two times a day (BID) | ORAL | Status: AC

## 2012-06-20 NOTE — Progress Notes (Signed)
Social Work Discharge Note Discharge Note  The overall goal for the admission was met for:   Discharge location: Yes-HOME WITH HIRED CAREGIVERS WHOM SHE HAD PRIOR TO ADMISSION  Length of Stay: Yes-14 DAYS  Discharge activity level: Yes-SUPERVISION LEVEL  Home/community participation: Yes  Services provided included: MD, RD, PT, OT, RN, CM, TR, Pharmacy and SW  Financial Services: Medicare and Private Insurance: [employer]  Follow-up services arranged: Home Health: [facility]-RN,PT,OT and Patient/Family has no preference for HH/DME agencies  Comments (or additional information):  Patient/Family verbalized understanding of follow-up arrangements: Yes  Individual responsible for coordination of the follow-up plan: [facility]  Confirmed correct DME delivered: Lucy Chris 06/20/2012    Lucy Chris

## 2012-06-20 NOTE — Progress Notes (Signed)
Patient ID: [name], female   DOB: 01/18/1924, 78 y.o.   MRN: [id number] Subjective/Complaints: More than ready to go home. Abdominal pain minimal. No diarrhea this am.   A 12 point review of systems has been performed and if not noted above is otherwise negative.   Objective: Vital Signs: Blood pressure 98/65, pulse 92, temperature 97.7 F (36.5 C), temperature source Oral, resp. rate 19, height 5\' 7"  (1.702 m), weight 76.3 kg (168 lb 3.4 oz), SpO2 94.00%. No results found.  Basename 06/20/12 0623 06/19/12 0623  WBC 8.5 8.7  HGB 11.9* 11.3*  HCT 35.8* 34.6*  PLT 290 279   No results found for this basename: NA:2,K:2,CL:2,CO2:2,GLUCOSE:2,BUN:2,CREATININE:2,CALCIUM:2 in the last 72 hours CBG (last 3)   Basename 06/20/12 0705 06/19/12 2127 06/19/12 1605  GLUCAP 105* 117* 116*    Wt Readings from Last 3 Encounters:  06/20/12 76.3 kg (168 lb 3.4 oz)  06/06/12 86.183 kg (190 lb)    Physical Exam:   General: Alert and oriented x 3, No apparent distress HEENT: Head is normocephalic, atraumatic, PERRLA, EOMI, sclera anicteric, oral mucosa pink and moist, dentition intact, ext ear canals clear,  Neck: Supple without JVD or lymphadenopathy Heart: Reg rate and rhythm. No murmurs rubs or gallops Chest: CTA bilaterally without wheezes, rales, or rhonchi; no distress Abdomen: Soft, mild LLQ, non-distended, bowel sounds positive. Extremities: No clubbing, cyanosis, or edema. Pulses are 2+ Skin: Clean and intact without signs of breakdown Neuro: Pt is cognitively appropriate with normal insight, memory, and awareness. Cranial nerves 2-12 are intact. Sensory exam is normal. Reflexes are 2+ in all 4's. Fine motor coordination is intact. No tremors. Motor function is grossly 5/5.  Musculoskeletal: Full ROM, No pain with AROM or PROM in the neck, trunk, or extremities. Posture appropriate Psych: Pt's affect is appropriate. Pt is cooperative   Assessment/Plan: 1. Functional  deficits secondary to deconditioning after urosepsis and multiple medical complications  Stable for D/C today.  FIM: FIM - Bathing Bathing Steps Patient Completed: Chest;Right Arm;Left Arm;Abdomen;Front perineal area;Buttocks;Right upper leg;Left upper leg;Right lower leg (including foot);Left lower leg (including foot) Bathing: 5: Set-up assist to: Obtain items  FIM - Upper Body Dressing/Undressing Upper body dressing/undressing steps patient completed: Thread/unthread right bra strap;Thread/unthread left bra strap;Hook/unhook bra;Thread/unthread right sleeve of pullover shirt/dresss;Thread/unthread left sleeve of pullover shirt/dress;Put head through opening of pull over shirt/dress;Pull shirt over trunk Upper body dressing/undressing: 5: Set-up assist to: Obtain clothing/put away FIM - Lower Body Dressing/Undressing Lower body dressing/undressing steps patient completed: Thread/unthread right underwear leg;Thread/unthread left underwear leg;Pull underwear up/down;Thread/unthread right pants leg;Thread/unthread left pants leg;Pull pants up/down;Don/Doff left sock;Don/Doff right shoe Lower body dressing/undressing: 5: Set-up assist to: Don/Doff TED stocking  FIM - Toileting Toileting steps completed by patient: Adjust clothing prior to toileting;Performs perineal hygiene;Adjust clothing after toileting Toileting Assistive Devices: Grab bar or rail for support Toileting: 5: Supervision: Safety issues/verbal cues  FIM - Diplomatic Services operational officer Devices: Grab bars;Walker Toilet Transfers: 5-To toilet/BSC: Supervision (verbal cues/safety issues);5-From toilet/BSC: Supervision (verbal cues/safety issues)  FIM - Banker Devices: Environmental consultant (rollator) Bed/Chair Transfer: 6: Supine > Sit: No assist;6: Sit > Supine: No assist;5: Bed > Chair or W/C: Supervision (verbal cues/safety issues);5: Chair or W/C > Bed: Supervision (verbal cues/safety  issues)  FIM - Locomotion: Wheelchair Distance: 17' with mod-max A for hand placement and UE propulsion sequence to maintain straight navigation and for changing direction; patient tends to propel with very short strokes.   Locomotion: Wheelchair: 2:  Travels 50 - 149 ft with supervision, cueing or coaxing FIM - Locomotion: Ambulation Locomotion: Ambulation Assistive Devices: Designer, industrial/product (rollator) Ambulation/Gait Assistance: 5: Supervision Locomotion: Ambulation: 5: Travels 150 ft or more with supervision/safety issues  Comprehension Comprehension Mode: Auditory Comprehension: 4-Understands basic 75 - 89% of the time/requires cueing 10 - 24% of the time  Expression Expression Mode: Verbal Expression: 5-Expresses complex 90% of the time/cues < 10% of the time  Social Interaction Social Interaction: 5-Interacts appropriately 90% of the time - Needs monitoring or encouragement for participation or interaction.  Problem Solving Problem Solving: 5-Solves basic problems: With no assist  Memory Memory: 4-Recognizes or recalls 75 - 89% of the time/requires cueing 10 - 24% of the time  Medical Problem List and Plan:  1. DVT Prophylaxis/Anticoagulation: Pharmaceutical: Pradexa continue 2. Pain Management: N/A. Continue neurontin for diabetic neuropathy.  3. Mood: alert and appropriate. Will monitor for now.  4. Neuropsych: This patient is capable of making decisions on his/her own behalf.  5. Acute on chronic CHF: NO signs of overload despite upward trend in weight. Low salt diet. Will decrease lasix to 40 mg due to low BP.  6. Atrial Fibrillation: Monitor HR with bid checks. Continue amiodarone, cardizem and pradaxa. Cardizem CD discontinued. 7. Hypothyroid: On supplement.  8. E. Coli UTI: finished abx. Leucocytosis resolved. UCS 8/19 without recurrent infection.  9. DM type 2 with peripheral neuropathy: monitor BS with AC/HS cbg checks. Glucotrol on hold currently. Monitor po intake  and will resume if BS on upward trend.  10. Diarrhea: Continue probiotic. C-diff negative low dose immodium check stool guaic, OB positive  GI consult- antibiotic associated diarrhea with either hemorrhoidal or small diverticular bleeding -minor will monitor  11. ABLA:Stable H/H has varied from 10.5-12.4 range. H/H slowly improving. Follow up with primary MD.   12. Insomnia: Home RX sonata which is very effective   LOS (Days) 14 A FACE TO FACE EVALUATION WAS PERFORMED  Love, Evlyn Kanner 06/20/2012, 10:07 AM

## 2012-06-20 NOTE — Progress Notes (Signed)
Discharge summary # [phone]

## 2012-06-20 NOTE — Progress Notes (Signed)
Patient and daughter received written and verbal discharge instructions from Marissa Nestle, [place]. Patient and daughter deny questions or concerns, they are aware of follow up appointments and medication changes,discussed with patient and daughter skin care for redness to sacrum and yeast resolving to periarea with use of barrier cream and turning and boosting in chair for sacrum and use of antifungal powder/nystatin powder to periarea, Patient was given her 3 capsules of Sonata from main pharmacy as they were packaging home medication for use while in hospital. Patient also has home CPAP mask and chin strap for return to home, her own four wheel walker, personal belongings packed, patient taken down to private vehicle by NT via wheelchair. [name], Aylana Hirschfeld Elon Jester

## 2012-06-21 NOTE — Discharge Summary (Signed)
[name], BRAND             ACCOUNT NO.:  [id number]  MEDICAL RECORD NO.:  [id number]  LOCATION:  4147                         FACILITY:  [facility]  PHYSICIAN:  Erick Colace, M.D.DATE OF BIRTH:  09-24-1924  DATE OF ADMISSION:  06/06/2012 DATE OF DISCHARGE:  06/20/2012                              DISCHARGE SUMMARY   DISCHARGE DIAGNOSES: 1. Deconditioning due to multiple medical issues. 2. Acute on chronic congestive heart failure, compensated. 3. Atrial fibrillation. 4. Escherichia coli urinary tract infection treated. 5. Diabetes mellitus type 2 with peripheral neuropathy. 6. Diarrhea, improving. 7. Acute blood loss anemia, improved. 8. Heme-positive stools.  HISTORY OF PRESENT ILLNESS:  [name] is an 78 year old female with history of diabetes mellitus, AFib, and recent UTI, admitted to the hospital on May 29, 2012, with epigastric abdominal pain.  She was noted to have leukocytosis and was started on Cipro for UTI.  She did develop bloody diarrhea with hypotension and AFib with RVR. She  Required Neo-Synephrine drip due to persistent hypotension. She was Started on amiodarone infusion for heart rate control.  Bloody diarrhea has resolved and it was felt that this was due to ischemic colitis related to her hypotensive state.  CT of abdomen and pelvis showed bowel wall thickening of right colon, rectum, distal ileum as well as mesenteric edema.  Dr. Mayford Knife has been following for input on cardiac issues.  Currently, AFib is controlled on p.o. amiodarone and Cardizem.  Pradaxa was resumed 08/07.  Antibiotics were changed to IV Rocephin with recommendations to continue for 5 additional days to complete her antibiotic regimen.  The patient was evaluated by therapy team and CIR was recommended for progression.  PAST MEDICAL HISTORY: 1. CHF. 2. Hypertension. 3. AFib. 4. Peripheral neuropathy. 5. Asthma. 6. Type 2 diabetes mellitus. 7. PAD. 8. Enlarged  heart. 9. History of sleep apnea. 10.Hypothyroidism. 11.Stroke in 2006 and 2011. 12.Chronic kidney disease. 13.Gout.  FUNCTIONAL HISTORY:  The patient was independent prior to admission. She required 24-hour supervision and some assistance with ADLs.  Was able to ambulate with standby assist with walker.  FUNCTIONAL STATUS:  The patient is +1 total assist 50-60% for bed mobility, mod assist for sit-to-stand transfers, +2 total assist 70% for ambulating 2 feet with rolling walker.  Noted to have posterior lean, requiring facilitation with to self correct and poor postural stability. She was requiring mod assist for toileting transfers.  HOSPITAL COURSE:  [name] was admitted to Rehab on June 06, 2012, for inpatient therapies to consist of PT, OT at least 3 hours 5 days a week.  Past-admission, physiatrist, rehab RN, and therapy team have worked together to provide customized collaborative interdisciplinary care.  Rehab RN has worked with the patient on bowel and bladder program as well as safety plan.  Daily weights were monitored throughout the stay.  Most recent weight at time of discharge is at 76.3 kg.  The patient's blood pressure and heart rate were monitored on b.i.d. basis.  She was noted to be hypotensive with systolic blood pressures frequently in 90s range.  Her Cardizem was discontinued.  Most recent blood pressures are ranging from 98-120s systolic and 60-70s diastolic.  Her Lasix  was further decreased to 40 mg a day at time of discharge.  The patient is advised to continue monitoring daily weights and follow up with Cardiology if weights increased by 3 pounds in a day or for shortness of breath or development Of peripheral edema.   Routine check of labs were done during this stay.  H and H at time of admission was noted to be at 11.1.  She was noted to have a heme- positive stools and GI was consulted for input as she continued to have problems with  diarrhea and abdominal pain.  Dr. Evette Cristal evaluated the patient and felt that heme-positive stools likely due to mild diverticular bleed and/ orhemorrhoidal in nature.  He did not feel that the patient had ischemic colitis symptoms and recommended supportive care and observation.  Most recent CBC of June 19, 2012, reveals hemoglobin 11.9, hematocrit 35.8, white count 8.5, platelets 290.  The patient has completed her antibiotic regimen.  Followup urine culture of June 17, 2012, shows 50,000 colonies of multi-species.  The patient is to continue on probiotics for the next several weeks per GI input.  She will follow up with [facility] for any recurrent or persistent symptoms.  During the patient's stay in Rehab, weekly team conferences were held to monitor the patient's progress, set goals, as well as discuss barriers to discharge.  Physical Therapy has worked with the patient on mobility, activity tolerance and strengthening.  The patient is currently at supervision level for transfers, supervision level for ambulating 150 feet with rolling walker.  She is able to navigate 12 stairs with 2 rails with min assist, and cues for sequencing.  OT has worked with the patient on self-care tasks.  The patient is currently at supervision level for bathing and dressing.  She does occasionally require min cues for sequencing.  Family education was done with the patient's daughter who will continue to provide assistance with ADLs.  Further followup home health PT and OT has been set up via [facility].  On June 20, 2012, the patient is discharged to home.  DISCHARGE MEDICATIONS: 1. Amiodarone 200 mg a day. 2. Toprol-XL 25 mg half p.o. per day. 3. Ditropan 0.5 mg at bedtime. 4. FiberCon 2 tabs p.o. b.i.d. 5. Florastor 250 mg b.i.d. 6. Furosemide 80 mg half p.o. per day. 7. K-Dur 10 mEq 2 tabs p.o. per day. 8. Allopurinol 300 mg p.o. per day. 9. Lipitor 10 mg p.o. per  day. 10.Pradaxa 75 mg b.i.d. 11.Fish oil 1 p.o. per day. 12.Gabapentin 300 mg p.o. per day. 13.Garlique 1 tab p.o. per day. 14. Levothyroxine 88 mcg p.o. per day. 15. Multivitamin 1 p.o. per day. 16. Omeprazole 20 mg p.o. per day. 17. Probiotic p.o. per day. 18. Vitamin C 1 p.o. per day. 19. Sonata 5 mg p.o. at bedtime.  DIET:  Diabetic diet with low salt restrictions.  ACTIVITY:  Continue 24-hour supervision.  Walk with walker.  SPECIAL INSTRUCTIONS:  Weigh herself daily.  Call MD if weight goes up by 3 pounds in a day or if you notice shortness of breath and swelling.  Check blood sugars b.i.d. and record.  Discussed need to resume glipizide with Dr. Pete Glatter.  FOLLOWUP:  The patient to follow up with Dr. Pete Glatter on June 27, 2012, at 2:15.  Follow up with Dr. Armanda Magic for posthospital check. Follow up with Dr. Matthias Hughs or Dr. Evette Cristal for GI issues.  Follow up with Dr. Wynn Banker as needed.  Delle Reining, P.A.   ______________________________ Erick Colace, M.D.    PL/MEDQ  D:  06/20/2012  T:  06/21/2012  Job:  [id number]  cc:   Armanda Magic, M.D. Hal T. Stoneking, M.D. Bernette Redbird, M.D.

## 2012-07-04 DIAGNOSIS — I5033 Acute on chronic diastolic (congestive) heart failure: Secondary | ICD-10-CM

## 2012-07-04 DIAGNOSIS — K5289 Other specified noninfective gastroenteritis and colitis: Secondary | ICD-10-CM

## 2012-07-04 DIAGNOSIS — Z0271 Encounter for disability determination: Secondary | ICD-10-CM

## 2012-08-12 ENCOUNTER — Other Ambulatory Visit: Payer: Self-pay | Admitting: *Deleted

## 2012-08-12 MED ORDER — OXYBUTYNIN CHLORIDE 5 MG PO TABS: 2.5000 mg | ORAL_TABLET | Freq: Every day | ORAL | Status: DC

## 2012-08-14 ENCOUNTER — Telehealth: Payer: Self-pay | Admitting: Physical Medicine & Rehabilitation

## 2012-08-14 NOTE — Telephone Encounter (Signed)
PT recert for 2wk/4

## 2012-08-14 NOTE — Telephone Encounter (Signed)
Verbal order given to Ree Kida

## 2012-09-04 ENCOUNTER — Other Ambulatory Visit: Payer: Self-pay | Admitting: Physical Medicine & Rehabilitation

## 2012-11-05 ENCOUNTER — Other Ambulatory Visit (HOSPITAL_COMMUNITY): Payer: Self-pay | Admitting: Cardiology

## 2012-11-05 DIAGNOSIS — I4891 Unspecified atrial fibrillation: Secondary | ICD-10-CM

## 2012-11-13 ENCOUNTER — Ambulatory Visit (HOSPITAL_COMMUNITY)
Admission: RE | Admit: 2012-11-13 | Discharge: 2012-11-13 | Disposition: A | Discharge status: Home / Self Care | Payer: Medicare Other | Source: Ambulatory Visit | Attending: Cardiology | Admitting: Cardiology

## 2012-11-13 DIAGNOSIS — I4891 Unspecified atrial fibrillation: Secondary | ICD-10-CM | POA: Insufficient documentation

## 2012-11-13 DIAGNOSIS — Z79899 Other long term (current) drug therapy: Secondary | ICD-10-CM | POA: Insufficient documentation

## 2013-01-27 ENCOUNTER — Ambulatory Visit (INDEPENDENT_AMBULATORY_CARE_PROVIDER_SITE_OTHER)
Admission: RE | Admit: 2013-01-27 | Discharge: 2013-01-27 | Disposition: A | Discharge status: Home / Self Care | Payer: Medicare Other | Source: Ambulatory Visit | Attending: Critical Care Medicine | Admitting: Critical Care Medicine

## 2013-01-27 ENCOUNTER — Encounter: Payer: Self-pay | Admitting: Critical Care Medicine

## 2013-01-27 ENCOUNTER — Ambulatory Visit (INDEPENDENT_AMBULATORY_CARE_PROVIDER_SITE_OTHER): Payer: Medicare Other | Admitting: Critical Care Medicine

## 2013-01-27 VITALS — BP 120/70 | HR 70 | Temp 98.0°F | Ht 69.0 in | Wt 161.4 lb

## 2013-01-27 DIAGNOSIS — R942 Abnormal results of pulmonary function studies: Secondary | ICD-10-CM

## 2013-01-27 DIAGNOSIS — R0609 Other forms of dyspnea: Secondary | ICD-10-CM

## 2013-01-27 DIAGNOSIS — E1142 Type 2 diabetes mellitus with diabetic polyneuropathy: Secondary | ICD-10-CM

## 2013-01-27 DIAGNOSIS — R0600 Dyspnea, unspecified: Secondary | ICD-10-CM

## 2013-01-27 DIAGNOSIS — R0989 Other specified symptoms and signs involving the circulatory and respiratory systems: Secondary | ICD-10-CM

## 2013-01-27 DIAGNOSIS — E1149 Type 2 diabetes mellitus with other diabetic neurological complication: Secondary | ICD-10-CM

## 2013-01-27 NOTE — Progress Notes (Signed)
Subjective:    Patient ID: [name], female    DOB: Jun 23, 1924, 79 y.o.   MRN: [id number] Referral for abn PFTs Shortness of Breath This is a chronic problem. The current episode started more than 1 year ago. The problem occurs daily (exertional with activity). Associated symptoms include headaches. Pertinent negatives include no abdominal pain, chest pain, claudication, coryza, ear pain, fever, hemoptysis, leg pain, leg swelling, neck pain, orthopnea, PND, rash, rhinorrhea, sore throat, sputum production, swollen glands, syncope, vomiting or wheezing. The symptoms are aggravated by exercise. Risk factors: life long never smoker, passive smoker exposure is noted. Her past medical history is significant for a heart failure. There is no history of allergies, aspirin allergies, asthma, bronchiolitis, CAD, chronic lung disease, COPD, DVT, PE, pneumonia or a recent surgery.    Past Medical History  Diagnosis Date  . CHF (congestive heart failure)   . Hypertension   . Atrial fibrillation   . Peripheral neuropathy   . Asthma   . High cholesterol   . Diabetes with neurologic complications   . PAD (peripheral artery disease)   . Enlarged heart   . Anginal pain 05/28/12  . Pneumonia 11/2010  . Sleep apnea     cpap  . Hypothyroidism   . Headache 05/29/12    "recently"  . Stroke 05/2005; 08/2010    !mini"  . Chronic kidney disease     "something on labs always say she has some problems"  . Personal history of gout      Family History  Problem Relation Age of Onset  . Heart disease Mother   . Heart disease Father   . Heart disease Brother      History   Social History  . Marital Status: Widowed    Spouse Name: N/A    Number of Children: 3  . Years of Education: N/A   Occupational History  . RETIRED     [employer]   Social History Main Topics  . Smoking status: Never Smoker   . Smokeless tobacco: Never Used  . Alcohol Use: No  . Drug Use: No  . Sexually Active: No    Other Topics Concern  . Not on file   Social History Narrative  . No narrative on file     Allergies  Allergen Reactions  . Aspirin Other (See Comments)    Tears my stomach up; "can take coated Aspirin qd without problems"  . Nexium (Esomeprazole Magnesium) Rash  . Penicillins Rash    Tolerates Rocephin     Outpatient Prescriptions Prior to Visit  Medication Sig Dispense Refill  . allopurinol (ZYLOPRIM) 300 MG tablet Take 300 mg by mouth daily.      Marland Kitchen amiodarone (PACERONE) 200 MG tablet Take 1 tablet (200 mg total) by mouth daily.  30 tablet  1  . atorvastatin (LIPITOR) 10 MG tablet Take 10 mg by mouth daily.      . dabigatran (PRADAXA) 75 MG CAPS Take 75 mg by mouth every 12 (twelve) hours.      . furosemide (LASIX) 80 MG tablet Take 0.5 tablets (40 mg total) by mouth daily.      Marland Kitchen gabapentin (NEURONTIN) 300 MG capsule Take 300 mg by mouth at bedtime.      . Garlic (GARLIQUE) 400 MG TBEC Take 1 tablet by mouth daily.      Marland Kitchen levothyroxine (SYNTHROID, LEVOTHROID) 88 MCG tablet Take 88 mcg by mouth daily.      . metoprolol succinate (TOPROL-XL) 25  MG 24 hr tablet Take 0.5 tablets (12.5 mg total) by mouth daily.  15 tablet  1  . Multiple Vitamins-Minerals (MULTIVITAMIN WITH MINERALS) tablet Take 1 tablet by mouth daily.      . Omega-3 Fatty Acids (FISH OIL) 1000 MG CAPS Take 1 capsule by mouth daily.      Marland Kitchen omeprazole (PRILOSEC) 20 MG capsule Take 20 mg by mouth daily.      . polycarbophil (FIBERCON) 625 MG tablet Take 2 tablets (1,250 mg total) by mouth 2 (two) times daily.      . potassium chloride (K-DUR) 10 MEQ tablet Take one tablet twice a day.  60 tablet  1  . Probiotic Product (PROBIOTIC DAILY PO) Take 1 tablet by mouth 2 (two) times daily.       . vitamin C (ASCORBIC ACID) 500 MG tablet Take 500 mg by mouth daily.      . zaleplon (SONATA) 5 MG capsule Take 5 mg by mouth at bedtime.      Marland Kitchen oxybutynin (DITROPAN) 5 MG tablet Take 0.5 tablets (2.5 mg total) by mouth at  bedtime. For hyperactive bladder  15 tablet  0  . PRESCRIPTION MEDICATION Take 1 tablet by mouth 2 (two) times daily. Methylfolate/Calcium      . Calcium Carb-Cholecalciferol (CALCIUM + D3) 600-200 MG-UNIT TABS Take 1 tablet by mouth daily.       No facility-administered medications prior to visit.       Review of Systems  Constitutional: Negative for fever, chills, diaphoresis, activity change, appetite change, fatigue and unexpected weight change.  HENT: Positive for sneezing. Negative for hearing loss, ear pain, nosebleeds, congestion, sore throat, facial swelling, rhinorrhea, mouth sores, trouble swallowing, neck pain, neck stiffness, dental problem, voice change, postnasal drip, sinus pressure, tinnitus and ear discharge.   Eyes: Positive for itching. Negative for photophobia, discharge and visual disturbance.  Respiratory: Positive for shortness of breath. Negative for apnea, cough, hemoptysis, sputum production, choking, chest tightness, wheezing and stridor.   Cardiovascular: Negative for chest pain, palpitations, orthopnea, claudication, leg swelling, syncope and PND.  Gastrointestinal: Negative for nausea, vomiting, abdominal pain, constipation, blood in stool and abdominal distention.  Genitourinary: Positive for flank pain. Negative for dysuria, urgency, frequency, hematuria, decreased urine volume and difficulty urinating.  Musculoskeletal: Positive for gait problem. Negative for myalgias, back pain, joint swelling and arthralgias.  Skin: Negative for color change, pallor and rash.  Neurological: Positive for dizziness and headaches. Negative for tremors, seizures, syncope, speech difficulty, weakness, light-headedness and numbness.  Hematological: Negative for adenopathy. Bruises/bleeds easily.  Psychiatric/Behavioral: Positive for sleep disturbance. Negative for confusion and agitation. The patient is not nervous/anxious.        Objective:   Physical Exam Filed Vitals:    01/27/13 1609  BP: 120/70  Pulse: 70  Temp: 98 F (36.7 C)  TempSrc: Oral  Height: 5\' 9"  (1.753 m)  Weight: 73.211 kg (161 lb 6.4 oz)  SpO2: 92%    Gen: Pleasant, well-nourished, in no distress,  normal affect  ENT: No lesions,  mouth clear,  oropharynx clear, no postnasal drip  Neck: No JVD, no TMG, no carotid bruits  Lungs: No use of accessory muscles, no dullness to percussion, clear without rales or rhonchi  Cardiovascular: RRR, heart sounds normal, no murmur or gallops, no peripheral edema  Abdomen: soft and NT, no HSM,  BS normal  Musculoskeletal: No deformities, no cyanosis or clubbing  Neuro: alert, non focal  Skin: Warm, no lesions or rashes  Dg  Chest 2 View  01/27/2013  *RADIOLOGY REPORT*  Clinical Data: Short of breath.  Amiodorone  CHEST - 2 VIEW  Comparison: 06/03/2012  Findings: Cardiac enlargement without heart failure.  Lungs are clear without infiltrate or effusion.  No significant fibrosis.  No mass lesion.  Compression fracture of approximately T11 which is not present on the CT of 05/31/2012.  IMPRESSION: Cardiac enlargement.  No acute abnormality.  Compression fracture approximately T11   Original Report Authenticated By: Janeece Riggers, M.D.    01/27/13: spiro: mild restriction.    10/2012: Full pft: mild restriction.  DLCO low but technical difficulties       Assessment & Plan:   Abnormal PFT Low DLCO likely artifactual, pt could not cooperate with testing.  Spirometry shows obstruction, mild abnormalities.  No true dyspnea. Pt on amiodarone and no evidence of lung injury on CXR.   I do not feel amiodarone is contraindicated at this time.  No pulmonary treatment or further w/u indicated. Pt does have OSA and is adequately Rx on CPAP.  Plan Cont cpap at night No further pulm w/u needed Pt is ok to continue Amiodarone.      Updated Medication List Outpatient Encounter Prescriptions as of 01/27/2013  Medication Sig Dispense Refill  . acetaminophen  (TYLENOL) 500 MG tablet Take 500 mg by mouth every 6 (six) hours as needed for pain.      Marland Kitchen allopurinol (ZYLOPRIM) 300 MG tablet Take 300 mg by mouth daily.      Marland Kitchen amiodarone (PACERONE) 200 MG tablet Take 1 tablet (200 mg total) by mouth daily.  30 tablet  1  . atorvastatin (LIPITOR) 10 MG tablet Take 10 mg by mouth daily.      . Calcium Carbonate-Vit D-Min 1200-1000 MG-UNIT CHEW Chew 1 tablet by mouth daily.      . dabigatran (PRADAXA) 75 MG CAPS Take 75 mg by mouth every 12 (twelve) hours.      . furosemide (LASIX) 80 MG tablet Take 0.5 tablets (40 mg total) by mouth daily.      Marland Kitchen gabapentin (NEURONTIN) 300 MG capsule Take 300 mg by mouth at bedtime.      . Garlic (GARLIQUE) 400 MG TBEC Take 1 tablet by mouth daily.      . L-methylfolate Calcium 7.5 MG TABS Take 1 tablet by mouth 2 (two) times daily.      . Lancets (FREESTYLE) lancets As directed      . levothyroxine (SYNTHROID, LEVOTHROID) 88 MCG tablet Take 88 mcg by mouth daily.      . metoprolol succinate (TOPROL-XL) 25 MG 24 hr tablet Take 0.5 tablets (12.5 mg total) by mouth daily.  15 tablet  1  . Multiple Vitamins-Minerals (MULTIVITAMIN WITH MINERALS) tablet Take 1 tablet by mouth daily.      . Omega-3 Fatty Acids (FISH OIL) 1000 MG CAPS Take 1 capsule by mouth daily.      Marland Kitchen omeprazole (PRILOSEC) 20 MG capsule Take 20 mg by mouth daily.      . ONE TOUCH ULTRA TEST test strip As directed      . oxybutynin (DITROPAN) 5 MG tablet Take 5 mg by mouth at bedtime. For hyperactive bladder      . polycarbophil (FIBERCON) 625 MG tablet Take 2 tablets (1,250 mg total) by mouth 2 (two) times daily.      . potassium chloride (K-DUR) 10 MEQ tablet Take one tablet twice a day.  60 tablet  1  . Probiotic Product (PROBIOTIC DAILY PO)  Take 1 tablet by mouth 2 (two) times daily.       . vitamin C (ASCORBIC ACID) 500 MG tablet Take 500 mg by mouth daily.      . zaleplon (SONATA) 5 MG capsule Take 5 mg by mouth at bedtime.      . [DISCONTINUED] KLOR-CON  M10 10 MEQ tablet Take 1 tablet by mouth 2 (two) times daily.      . [DISCONTINUED] oxybutynin (DITROPAN) 5 MG tablet Take 0.5 tablets (2.5 mg total) by mouth at bedtime. For hyperactive bladder  15 tablet  0  . [DISCONTINUED] PRESCRIPTION MEDICATION Take 1 tablet by mouth 2 (two) times daily. Methylfolate/Calcium      . [DISCONTINUED] Calcium Carb-Cholecalciferol (CALCIUM + D3) 600-200 MG-UNIT TABS Take 1 tablet by mouth daily.       No facility-administered encounter medications on file as of 01/27/2013.

## 2013-01-27 NOTE — Patient Instructions (Addendum)
No change in medications A chest xray will be obtained, if this is normal, will plan on recommending continuing amiodarone without change, we will call results Return pulmonary as needed.

## 2013-01-28 DIAGNOSIS — R942 Abnormal results of pulmonary function studies: Secondary | ICD-10-CM | POA: Insufficient documentation

## 2013-01-28 NOTE — Assessment & Plan Note (Signed)
Low DLCO likely artifactual, pt could not cooperate with testing.  Spirometry shows obstruction, mild abnormalities.  No true dyspnea. Pt on amiodarone and no evidence of lung injury on CXR.   I do not feel amiodarone is contraindicated at this time.  No pulmonary treatment or further w/u indicated. Pt does have OSA and is adequately Rx on CPAP.  Plan Cont cpap at night No further pulm w/u needed Pt is ok to continue Amiodarone.

## 2013-01-29 NOTE — Progress Notes (Signed)
Quick Note:  Notify the patient that the Xray is stable. She does NOT have amiodarone toxicity and she can STAY on amiodarone ______

## 2013-01-30 ENCOUNTER — Telehealth: Payer: Self-pay | Admitting: Critical Care Medicine

## 2013-01-30 NOTE — Telephone Encounter (Signed)
Spoke with [name], patients daughter informed her of results/recs as listed below per Dr. Delford Field. Verbalized understanding and nothing further needed at this time.   Result Note    Notify the patient that the Xray is stable. She does NOT have amiodarone toxicity and she can STAY on amiodarone

## 2013-01-30 NOTE — Progress Notes (Signed)
Quick Note:  Called, spoke with pt. Informed her of cxr results and recs per Dr. Delford Field. She verbalized understanding and requesting I call her daughter to inform her as well. I called pt's daughter, Lazarus Salines on home # ______

## 2013-01-30 NOTE — Progress Notes (Signed)
Quick Note:  Spoke with teresa, patients daughter informed her of results/recs as listed below per Dr. Delford Field. Verbalized understanding and nothing further needed at this time. ______

## 2013-04-07 ENCOUNTER — Other Ambulatory Visit: Payer: Self-pay

## 2013-04-07 DIAGNOSIS — Z1231 Encounter for screening mammogram for malignant neoplasm of breast: Secondary | ICD-10-CM

## 2013-04-08 ENCOUNTER — Emergency Department (HOSPITAL_COMMUNITY)
Admission: EM | Admit: 2013-04-08 | Discharge: 2013-04-08 | Disposition: A | Discharge status: Home / Self Care | Payer: Medicare Other | Attending: Emergency Medicine | Admitting: Emergency Medicine

## 2013-04-08 ENCOUNTER — Encounter (HOSPITAL_COMMUNITY): Payer: Self-pay | Admitting: Emergency Medicine

## 2013-04-08 ENCOUNTER — Emergency Department (HOSPITAL_COMMUNITY): Payer: Medicare Other

## 2013-04-08 DIAGNOSIS — I509 Heart failure, unspecified: Secondary | ICD-10-CM | POA: Insufficient documentation

## 2013-04-08 DIAGNOSIS — Z880 Allergy status to penicillin: Secondary | ICD-10-CM | POA: Insufficient documentation

## 2013-04-08 DIAGNOSIS — R1013 Epigastric pain: Secondary | ICD-10-CM | POA: Insufficient documentation

## 2013-04-08 DIAGNOSIS — I129 Hypertensive chronic kidney disease with stage 1 through stage 4 chronic kidney disease, or unspecified chronic kidney disease: Secondary | ICD-10-CM | POA: Insufficient documentation

## 2013-04-08 DIAGNOSIS — Z79899 Other long term (current) drug therapy: Secondary | ICD-10-CM | POA: Insufficient documentation

## 2013-04-08 DIAGNOSIS — E1142 Type 2 diabetes mellitus with diabetic polyneuropathy: Secondary | ICD-10-CM | POA: Insufficient documentation

## 2013-04-08 DIAGNOSIS — R110 Nausea: Secondary | ICD-10-CM | POA: Insufficient documentation

## 2013-04-08 DIAGNOSIS — M109 Gout, unspecified: Secondary | ICD-10-CM | POA: Insufficient documentation

## 2013-04-08 DIAGNOSIS — Z8679 Personal history of other diseases of the circulatory system: Secondary | ICD-10-CM | POA: Insufficient documentation

## 2013-04-08 DIAGNOSIS — J45909 Unspecified asthma, uncomplicated: Secondary | ICD-10-CM | POA: Insufficient documentation

## 2013-04-08 DIAGNOSIS — Z8673 Personal history of transient ischemic attack (TIA), and cerebral infarction without residual deficits: Secondary | ICD-10-CM | POA: Insufficient documentation

## 2013-04-08 DIAGNOSIS — Z8669 Personal history of other diseases of the nervous system and sense organs: Secondary | ICD-10-CM | POA: Insufficient documentation

## 2013-04-08 DIAGNOSIS — Z9889 Other specified postprocedural states: Secondary | ICD-10-CM | POA: Insufficient documentation

## 2013-04-08 DIAGNOSIS — E7800 Pure hypercholesterolemia, unspecified: Secondary | ICD-10-CM | POA: Insufficient documentation

## 2013-04-08 DIAGNOSIS — Z8701 Personal history of pneumonia (recurrent): Secondary | ICD-10-CM | POA: Insufficient documentation

## 2013-04-08 DIAGNOSIS — E1149 Type 2 diabetes mellitus with other diabetic neurological complication: Secondary | ICD-10-CM | POA: Insufficient documentation

## 2013-04-08 DIAGNOSIS — N189 Chronic kidney disease, unspecified: Secondary | ICD-10-CM | POA: Insufficient documentation

## 2013-04-08 DIAGNOSIS — R12 Heartburn: Secondary | ICD-10-CM | POA: Insufficient documentation

## 2013-04-08 DIAGNOSIS — E039 Hypothyroidism, unspecified: Secondary | ICD-10-CM | POA: Insufficient documentation

## 2013-04-08 DIAGNOSIS — I4891 Unspecified atrial fibrillation: Secondary | ICD-10-CM | POA: Insufficient documentation

## 2013-04-08 LAB — URINALYSIS, ROUTINE W REFLEX MICROSCOPIC
Bilirubin Urine: NEGATIVE
Glucose, UA: NEGATIVE mg/dL
Ketones, ur: NEGATIVE mg/dL
Protein, ur: NEGATIVE mg/dL
pH: 7 (ref 5.0–8.0)

## 2013-04-08 LAB — COMPREHENSIVE METABOLIC PANEL
ALT: 33 U/L (ref 0–35)
Alkaline Phosphatase: 86 U/L (ref 39–117)
BUN: 30 mg/dL — ABNORMAL HIGH (ref 6–23)
CO2: 35 mEq/L — ABNORMAL HIGH (ref 19–32)
GFR calc Af Amer: 47 mL/min — ABNORMAL LOW (ref >90)
GFR calc non Af Amer: 41 mL/min — ABNORMAL LOW (ref >90)
Glucose, Bld: 110 mg/dL — ABNORMAL HIGH (ref 70–99)
Potassium: 4.2 mEq/L (ref 3.5–5.1)
Sodium: 140 mEq/L (ref 135–145)
Total Protein: 6.9 g/dL (ref 6.0–8.3)

## 2013-04-08 LAB — CBC
HCT: 39.4 % (ref 36.0–46.0)
Hemoglobin: 13 g/dL (ref 12.0–15.0)
MCH: 33.3 pg (ref 26.0–34.0)
MCHC: 33 g/dL (ref 30.0–36.0)
RBC: 3.9 MIL/uL (ref 3.87–5.11)

## 2013-04-08 LAB — URINE MICROSCOPIC-ADD ON

## 2013-04-08 LAB — POCT I-STAT TROPONIN I

## 2013-04-08 LAB — PROTIME-INR: Prothrombin Time: 15.9 seconds — ABNORMAL HIGH (ref 11.6–15.2)

## 2013-04-08 MED ORDER — GI COCKTAIL ~~LOC~~
30.0000 mL | Freq: Once | ORAL | Status: AC
  Administered 2013-04-08: 30 mL via ORAL
  Filled 2013-04-08: qty 30

## 2013-04-08 MED ORDER — NITROGLYCERIN 0.4 MG SL SUBL: 0.4000 mg | SUBLINGUAL_TABLET | SUBLINGUAL | Status: DC | PRN

## 2013-04-08 NOTE — ED Notes (Signed)
Pt to ED by EMS with c/o epigastric pain, onset 930 today with nausea. Pt denies pain now. Per EMS, EKG right bundle block and ST depression. Hx of cardiac ablation and GERD. Pt is allergic to ASA. Per EMS, BP-130/66, O2-97% on room air, pulse-62, CBG-160 (diabetic).

## 2013-04-08 NOTE — ED Provider Notes (Addendum)
History     CSN: [id number]  Arrival date & time 04/08/13  1533   First MD Initiated Contact with Patient 04/08/13 1558      Chief Complaint  Patient presents with  . Chest Pain    (Consider location/radiation/quality/duration/timing/severity/associated sxs/prior treatment) Patient is a 79 y.o. female presenting with chest pain. The history is provided by the patient.  Chest Pain Pain location:  Epigastric Pain quality: burning   Pain quality comment:  States feels like a ball in the abd Pain radiates to:  Does not radiate Pain radiates to the back: no   Onset quality:  Sudden Timing:  Constant Progression:  Resolved Chronicity:  Recurrent Context comment:  States at 9:30 sx started.  did not resolved till after 2 Relieved by:  Nothing Worsened by:  Movement Ineffective treatments:  Antacids (soda water) Associated symptoms: abdominal pain, heartburn and nausea   Associated symptoms: no back pain, no cough, no diaphoresis, no fever, no lower extremity edema, no palpitations, no shortness of breath, no syncope, not vomiting and no weakness   Risk factors: no coronary artery disease, no smoking and no surgery   Risk factors comment:  A.fib   Past Medical History  Diagnosis Date  . CHF (congestive heart failure)   . Hypertension   . Atrial fibrillation   . Peripheral neuropathy   . Asthma   . High cholesterol   . Diabetes with neurologic complications   . PAD (peripheral artery disease)   . Enlarged heart   . Anginal pain 05/28/12  . Pneumonia 11/2010  . Sleep apnea     cpap  . Hypothyroidism   . Headache(784.0) 05/29/12    "recently"  . Stroke 05/2005; 08/2010    !mini"  . Chronic kidney disease     "something on labs always say she has some problems"  . Personal history of gout     Past Surgical History  Procedure Laterality Date  . Tonsillectomy and adenoidectomy  1960's  . Dilation and curettage of uterus    . Vaginal hysterectomy  1970's  . Total knee  arthroplasty  1970-~2002    left; right  . Cataract extraction w/ intraocular lens  implant, bilateral  ?1970's    Family History  Problem Relation Age of Onset  . Heart disease Mother   . Heart disease Father   . Heart disease Brother     History  Substance Use Topics  . Smoking status: Never Smoker   . Smokeless tobacco: Never Used  . Alcohol Use: No    OB History   Grav Para Term Preterm Abortions TAB SAB Ect Mult Living                  Review of Systems  Constitutional: Negative for fever and diaphoresis.  Respiratory: Negative for cough and shortness of breath.   Cardiovascular: Positive for chest pain. Negative for palpitations and syncope.  Gastrointestinal: Positive for heartburn, nausea and abdominal pain. Negative for vomiting.  Musculoskeletal: Negative for back pain.  Neurological: Negative for weakness.  All other systems reviewed and are negative.    Allergies  Aspirin; Nexium; and Penicillins  Home Medications   Current Outpatient Rx  Name  Route  Sig  Dispense  Refill  . acetaminophen (TYLENOL) 500 MG tablet   Oral   Take 500 mg by mouth every 6 (six) hours as needed for pain.         Marland Kitchen allopurinol (ZYLOPRIM) 300 MG tablet   Oral  Take 300 mg by mouth daily.         Marland Kitchen amiodarone (PACERONE) 200 MG tablet   Oral   Take 1 tablet (200 mg total) by mouth daily.   30 tablet   1   . atorvastatin (LIPITOR) 10 MG tablet   Oral   Take 10 mg by mouth daily.         . Calcium Carbonate-Vitamin D (CALCIUM-VITAMIN D) 600-200 MG-UNIT CAPS   Oral   Take 1 tablet by mouth 2 (two) times daily.         . dabigatran (PRADAXA) 75 MG CAPS   Oral   Take 75 mg by mouth every 12 (twelve) hours.         . furosemide (LASIX) 80 MG tablet   Oral   Take 0.5 tablets (40 mg total) by mouth daily.         Marland Kitchen gabapentin (NEURONTIN) 300 MG capsule   Oral   Take 300 mg by mouth at bedtime.         . Garlic (GARLIQUE) 400 MG TBEC   Oral   Take  1 tablet by mouth daily.         Marland Kitchen Ketotifen Fumarate (ZADITOR OP)   Both Eyes   Place 1 drop into both eyes daily as needed (for dry eyes).         . L-methylfolate Calcium 7.5 MG TABS   Oral   Take 1 tablet by mouth 2 (two) times daily.         Marland Kitchen levothyroxine (SYNTHROID, LEVOTHROID) 88 MCG tablet   Oral   Take 88 mcg by mouth daily.         . metoprolol succinate (TOPROL-XL) 25 MG 24 hr tablet   Oral   Take 0.5 tablets (12.5 mg total) by mouth daily.   15 tablet   1   . Multiple Vitamins-Minerals (MULTIVITAMIN WITH MINERALS) tablet   Oral   Take 1 tablet by mouth daily.         . Omega-3 Fatty Acids (FISH OIL) 1000 MG CAPS   Oral   Take 1 capsule by mouth daily.         Marland Kitchen omeprazole (PRILOSEC) 20 MG capsule   Oral   Take 20 mg by mouth daily.         Marland Kitchen oxybutynin (DITROPAN) 5 MG tablet   Oral   Take 5 mg by mouth at bedtime. For hyperactive bladder         . polycarbophil (FIBERCON) 625 MG tablet   Oral   Take 2 tablets (1,250 mg total) by mouth 2 (two) times daily.         . potassium chloride (K-DUR,KLOR-CON) 10 MEQ tablet   Oral   Take 10 mEq by mouth 2 (two) times daily.         . Probiotic Product (PROBIOTIC DAILY PO)   Oral   Take 1 tablet by mouth 2 (two) times daily.          . vitamin C (ASCORBIC ACID) 500 MG tablet   Oral   Take 500 mg by mouth daily.         . zaleplon (SONATA) 5 MG capsule   Oral   Take 5 mg by mouth at bedtime.           BP 153/88  Pulse 66  Temp(Src) 98.1 F (36.7 C) (Oral)  Resp 23  SpO2 96%  Physical Exam  Nursing note  and vitals reviewed. Constitutional: She is oriented to person, place, and time. She appears well-developed and well-nourished. No distress.  HENT:  Head: Normocephalic and atraumatic.  Eyes: EOM are normal. Pupils are equal, round, and reactive to light.  Cardiovascular: Normal rate, regular rhythm and intact distal pulses.  Exam reveals no friction rub.   Murmur  heard. Pulmonary/Chest: Effort normal and breath sounds normal. She has no wheezes. She has no rales.  Abdominal: Soft. Bowel sounds are normal. She exhibits no distension. There is tenderness in the epigastric area. There is no rebound and no guarding.  Mild epigastric discomfort.  Musculoskeletal: Normal range of motion. She exhibits no tenderness.  No edema  Neurological: She is alert and oriented to person, place, and time. No cranial nerve deficit.  Skin: Skin is warm and dry. No rash noted.  Psychiatric: She has a normal mood and affect. Her behavior is normal.    ED Course  Procedures (including critical care time)  Labs Reviewed  CBC - Abnormal; Notable for the following:    MCV 101.0 (*)    All other components within normal limits  COMPREHENSIVE METABOLIC PANEL - Abnormal; Notable for the following:    Chloride 95 (*)    CO2 35 (*)    Glucose, Bld 110 (*)    BUN 30 (*)    Creatinine, Ser 1.18 (*)    GFR calc non Af Amer 41 (*)    GFR calc Af Amer 47 (*)    All other components within normal limits  PROTIME-INR - Abnormal; Notable for the following:    Prothrombin Time 15.9 (*)    All other components within normal limits  APTT - Abnormal; Notable for the following:    aPTT 49 (*)    All other components within normal limits  URINALYSIS, ROUTINE W REFLEX MICROSCOPIC - Abnormal; Notable for the following:    APPearance CLOUDY (*)    Hgb urine dipstick SMALL (*)    Nitrite POSITIVE (*)    Leukocytes, UA LARGE (*)    All other components within normal limits  PRO B NATRIURETIC PEPTIDE - Abnormal; Notable for the following:    Pro B Natriuretic peptide (BNP) 2363.0 (*)    All other components within normal limits  URINE MICROSCOPIC-ADD ON - Abnormal; Notable for the following:    Squamous Epithelial / LPF MANY (*)    Bacteria, UA MANY (*)    All other components within normal limits  URINE CULTURE  POCT I-STAT TROPONIN I   Dg Chest 2 View  04/08/2013    *RADIOLOGY REPORT*  Clinical Data: Chest pain, hypertension, diabetes, CHF  CHEST - 2 VIEW  Comparison: 01/27/2013  Findings: Enlargement of cardiac silhouette with pulmonary vascular congestion. Calcified tortuous aorta. Chronic bronchitic changes without acute failure or consolidation. No pleural effusion or pneumothorax. Bones unremarkable.  IMPRESSION: Enlargement of cardiac silhouette with pulmonary vascular congestion. Chronic bronchitic changes. No definite acute infiltrate.   Original Report Authenticated By: Ulyses Southward, M.D.     Date: 04/08/2013  Rate: 67  Rhythm: atrial fibrillation  QRS Axis: normal  Intervals: normal  ST/T Wave abnormalities: nonspecific ST/T changes  Conduction Disutrbances:right bundle branch block  Narrative Interpretation:   Old EKG Reviewed: unchanged   1. Epigastric discomfort       MDM   Patient here complaining of epigastric discomfort with mild nausea that started around 9:30 today. She denies any shortness of breath, palpitations, near-syncope or diaphoresis. She has a history of A.  Fib and CHF but denies any prior history of coronary artery disease requiring stents are CABG. She does not take aspirin daily and do to an allergy and states she's had a stress test in the last year and an echo which were both okay.  Patient tried antacids and soda water without improvement around 9:30 when it started and [place] the fire station. She denies any symptoms now but states it was worse when she got up and moved around.  EKG is consistent with a right bundle branch block and atrial fibrillation which is unchanged from prior.  Chest x-ray, CBC, CMP, PT/INR, UA, BNP pending. Patient given GI cocktail as symptoms sound most related to GERD she also has a history of GERD but denies any recent medication changes, NSAIDs or new foods.  7:50 PM Labs are within normal limits and after over 8 hours of epigastric discomfort feel that if this was cardiac her troponin  would be elevated. After GI cocktail patient is asymptomatic. She was able to ambulate in the department without any symptoms. We'll discharge patient and have her follow up with Dr. Mayford Knife later this week.      Gwyneth Sprout, MD 04/08/13 1950  Gwyneth Sprout, MD 04/08/13 1610

## 2013-04-12 LAB — URINE CULTURE

## 2013-04-13 ENCOUNTER — Telehealth (HOSPITAL_COMMUNITY): Payer: Self-pay | Admitting: Emergency Medicine

## 2013-04-13 NOTE — Progress Notes (Addendum)
ED Antimicrobial Stewardship Positive Culture Follow Up   [name] is an 79 y.o. female who presented to [facility] on 04/08/2013 with a chief complaint of chest pain. Also with some c/o of abdominal pain.   Chief Complaint  Patient presents with  . Chest Pain    Recent Results (from the past 720 hour(s))  URINE CULTURE     Status: None   Collection Time    04/08/13  4:15 PM      Result Value Range Status   Specimen Description URINE, RANDOM   Final   Special Requests NONE   Final   Culture  Setup Time 04/09/2013 02:57   Final   Colony Count >=100,000 COLONIES/ML   Final   Culture KLEBSIELLA PNEUMONIAE   Final   Report Status 04/12/2013 FINAL   Final   Organism ID, Bacteria KLEBSIELLA PNEUMONIAE   Final    []  Treated with, organism resistant to prescribed antimicrobial [x]  Patient discharged originally without antimicrobial agent and treatment is now indicated  New antibiotic prescription: Cephalexin 500 mg PO every 12 hours for 7 days   ED Provider: Trixie Dredge, PA-C  Abran Duke 04/13/2013, 9:20 AM Infectious Diseases Pharmacist Phone# [phone]

## 2013-04-13 NOTE — ED Notes (Signed)
[facility] pharmacy called to make sure that pharmacist and PA-C were aware that patient was allergic to Penicillin. Informed her that pharmacist was aware based on note written on chart.

## 2013-04-13 NOTE — ED Notes (Signed)
Post ED Visit - Positive Culture Follow-up: Successful Patient Follow-Up  Culture assessed and recommendations reviewed by: []  Wes Dulaney, Pharm.D., BCPS []  Celedonio Miyamoto, Pharm.D., BCPS []  Georgina Pillion, [address].D., BCPS []  [place], [address].D., BCPS, AAHIVP []  Estella Husk, Pharm.D., BCPS, AAHIVP [x]  Abran Duke, [address].D., BCPS  Positive urine culture  [x]  Patient discharged without antimicrobial prescription and treatment is now indicated []  Organism is resistant to prescribed ED discharge antimicrobial []  Patient with positive blood cultures  Changes discussed with ED provider: Trixie Dredge PA-C New antibiotic prescription: Cephalexin 500 mg po every 12 hrs x 7 days    Kylie A Holland 04/13/2013, 10:39 AM

## 2013-04-17 ENCOUNTER — Encounter: Payer: Self-pay | Admitting: Neurology

## 2013-04-17 DIAGNOSIS — R269 Unspecified abnormalities of gait and mobility: Secondary | ICD-10-CM

## 2013-05-16 ENCOUNTER — Ambulatory Visit
Admission: RE | Admit: 2013-05-16 | Discharge: 2013-05-16 | Disposition: A | Discharge status: Home / Self Care | Payer: Medicare Other | Source: Ambulatory Visit

## 2013-05-16 DIAGNOSIS — Z1231 Encounter for screening mammogram for malignant neoplasm of breast: Secondary | ICD-10-CM

## 2013-05-19 ENCOUNTER — Encounter: Payer: Self-pay | Admitting: Neurology

## 2013-05-19 ENCOUNTER — Ambulatory Visit (INDEPENDENT_AMBULATORY_CARE_PROVIDER_SITE_OTHER): Payer: Medicare Other | Admitting: Neurology

## 2013-05-19 VITALS — BP 127/64 | HR 76 | Temp 97.6°F | Ht 65.0 in | Wt 160.0 lb

## 2013-05-19 DIAGNOSIS — I6350 Cerebral infarction due to unspecified occlusion or stenosis of unspecified cerebral artery: Secondary | ICD-10-CM

## 2013-05-19 DIAGNOSIS — I639 Cerebral infarction, unspecified: Secondary | ICD-10-CM

## 2013-05-19 NOTE — Progress Notes (Signed)
[facility]  PATIENT: [name] DOB: 1924-04-29   HISTORY FROM: patient and daughter, chart  REASON FOR VISIT: stroke follow up  HISTORY OF PRESENT ILLNESS:   [name] is a right-handed 79 year-old Caucasian female with left hemispheric subcortical infarct from cardiogenic embolism from new onset facial fibrillation in November 2011 who has done very well. She has gait difficulties at baseline from prior diabetic peripheral polyneuropathy which appears to be slightly worse after her stroke. Vascular risk factors of diabetes, hypertension, hyperlipidemia, atrial fibrillation and congestive heart failure.   UPDATE 05/19/13: Returns for followup after last visit on 10/15/2012. She continues to do well without any recurrent neurovascular symptoms.  She's tolerating Pradaxa  without major bleeding but has easy bruisability. She has brought along her blood pressure and sugar log with her. Blood pressure consistently runs between 110s to 120s range. Fasting sugars have ranged from 100-209 mg percent. Last hemoglobin A1c was 6.3. She ambulates with a rolling walker at all times and has a slow but steady gait. She lives at home alone but has a 24-hour caregiver with her at all times. She went to the hospital in early June for abdominal pain and cardiac cause was ruled out. She was not admitted and felt better quickly.  She and her daughter do not think her carotid Doppler has been repeated since February 2013.  Update 12/17 2013: She returns for followup after last visit on 10/03/2011. She continues to do well without any recurrent neurovascular symptoms. She's tolerating Pradaxa  without major bleeding but has easy bruisability. She has brought along her blood pressure and sugar log with her. Blood pressure consistently runs between 110s to 120s range. Fasting sugars have ranged from 115-148 mg percent. She had lipid profile checked a month ago by Dr. Pete Glatter and it  was apparently fine though I do not have those results. She had carotid ultrasound done on 12/27/2011 which showed minor plaques without hemodynamically significant stenosis. She has no new complaints today.   Returns for followup since last visit on 04/15/2012. She's doing well without recurrent neurovascular symptoms. Her blood sugars are ranging 109-148 and she has been discontinued from one of her antidiabetic medications. Her blood pressure has been 120s systolic. Continues to tolerate Doxil without significant bruising, also on aspirin 81 mg. Her last carotid Doppler was on 12/27/2011. Her son only complains that she has no interest in activities. She denies feeling depressed.   REVIEW OF SYSTEMS: Full 14 system review of systems performed and notable only for: constitutional: N/A  cardiovascular: N/A respiratory: N/A endocrine: N/A  ear/nose/throat: N/A  musculoskeletal: N/A Hematology/lymph: Easy bruising easy bleeding skin: N/A genitourinary: N/A Gastrointestinal: N/A allergy/immunology: Allergies neurological: N/A sleep: N/A psychiatric: N/A   ALLERGIES: Allergies  Allergen Reactions  . Aspirin Other (See Comments)    Tears my stomach up; "can take coated Aspirin qd without problems"  . Nexium (Esomeprazole Magnesium) Rash  . Penicillins Rash    Tolerates Rocephin    HOME MEDICATIONS: Outpatient Prescriptions Prior to Visit  Medication Sig Dispense Refill  . acetaminophen (TYLENOL) 500 MG tablet Take 500 mg by mouth every 6 (six) hours as needed for pain.      Marland Kitchen allopurinol (ZYLOPRIM) 300 MG tablet Take 300 mg by mouth daily.      Marland Kitchen amiodarone (PACERONE) 200 MG tablet Take 1 tablet (200 mg total) by mouth daily.  30 tablet  1  . atorvastatin (LIPITOR) 10 MG tablet Take 10 mg  by mouth daily.      . Calcium Carbonate-Vitamin D (CALCIUM-VITAMIN D) 600-200 MG-UNIT CAPS Take 1 tablet by mouth 2 (two) times daily.      . dabigatran (PRADAXA) 75 MG CAPS Take 75 mg by mouth  every 12 (twelve) hours.      . furosemide (LASIX) 80 MG tablet Take 0.5 tablets (40 mg total) by mouth daily.      Marland Kitchen gabapentin (NEURONTIN) 300 MG capsule Take 300 mg by mouth at bedtime.      . Garlic (GARLIQUE) 400 MG TBEC Take 400 mg by mouth daily.       Marland Kitchen Ketotifen Fumarate (ZADITOR OP) Place 1 drop into both eyes daily as needed (for dry eyes).      . L-methylfolate Calcium 7.5 MG TABS Take 1 tablet by mouth 2 (two) times daily.      Marland Kitchen levothyroxine (SYNTHROID, LEVOTHROID) 88 MCG tablet Take 88 mcg by mouth daily.      . metoprolol succinate (TOPROL-XL) 25 MG 24 hr tablet Take 0.5 tablets (12.5 mg total) by mouth daily.  15 tablet  1  . Multiple Vitamins-Minerals (MULTIVITAMIN WITH MINERALS) tablet Take 1 tablet by mouth daily.      . Omega-3 Fatty Acids (FISH OIL) 1000 MG CAPS Take 300 mg by mouth daily.       Marland Kitchen omeprazole (PRILOSEC) 20 MG capsule Take 20 mg by mouth daily.      Marland Kitchen oxybutynin (DITROPAN) 5 MG tablet Take 5 mg by mouth at bedtime. For hyperactive bladder      . polycarbophil (FIBERCON) 625 MG tablet Take 2 tablets (1,250 mg total) by mouth 2 (two) times daily.      . potassium chloride (K-DUR,KLOR-CON) 10 MEQ tablet Take 10 mEq by mouth 2 (two) times daily.      . Probiotic Product (PROBIOTIC DAILY PO) Take 1 tablet by mouth 2 (two) times daily.       . vitamin C (ASCORBIC ACID) 500 MG tablet Take 500 mg by mouth daily.      . zaleplon (SONATA) 5 MG capsule Take 10 mg by mouth at bedtime.        No facility-administered medications prior to visit.    PAST MEDICAL HISTORY: Past Medical History  Diagnosis Date  . CHF (congestive heart failure)   . Hypertension   . Atrial fibrillation   . Peripheral neuropathy   . Asthma   . High cholesterol   . Diabetes with neurologic complications   . PAD (peripheral artery disease)   . Enlarged heart   . Anginal pain 05/28/12  . Pneumonia 11/2010  . Sleep apnea     cpap  . Hypothyroidism   . Headache(784.0) 05/29/12     "recently"  . Stroke 05/2005; 08/2010    !mini"  . Chronic kidney disease     "something on labs always say she has some problems"  . Personal history of gout     PAST SURGICAL HISTORY: Past Surgical History  Procedure Laterality Date  . Tonsillectomy and adenoidectomy  1960's  . Dilation and curettage of uterus    . Vaginal hysterectomy  1970's  . Total knee arthroplasty  1970-~2002    left; right  . Cataract extraction w/ intraocular lens  implant, bilateral  ?1970's    FAMILY HISTORY: Family History  Problem Relation Age of Onset  . Heart disease Mother   . Heart disease Father   . Heart disease Brother     SOCIAL HISTORY:  History   Social History  . Marital Status: Widowed    Spouse Name: N/A    Number of Children: 3  . Years of Education: N/A   Occupational History  . RETIRED     [employer]   Social History Main Topics  . Smoking status: Never Smoker   . Smokeless tobacco: Never Used  . Alcohol Use: No  . Drug Use: No  . Sexually Active: No   Other Topics Concern  . Not on file   Social History Narrative   Retired. Lives alone. 3 adult children. No tobacco use. Nondrinker. No illicit drug use.      PHYSICAL EXAM  Filed Vitals:   05/19/13 1246  BP: 127/64  Pulse: 76  Temp: 97.6 F (36.4 C)  TempSrc: Oral  Height: 5\' 5"  (1.651 m)  Weight: 160 lb (72.576 kg)   Body mass index is 26.63 kg/(m^2).  Generalized: In no acute distress   Neck: Supple, no carotid bruits   Cardiac: Irregular rate and  rhythm, no murmur   Pulmonary: Clear to auscultation bilaterally   Musculoskeletal: No deformity   Neurological examination   Mentation: Alert oriented to time, place, history taking, language fluent, and causual conversation  Cranial nerve II-XII: Pupils were equal round reactive to light extraocular movements were full, visual field were full on confrontational test. facial sensation and strength were normal. hearing was intact to finger rubbing  bilaterally. Uvula tongue midline. head turning and shoulder shrug and were normal and symmetric.Tongue protrusion into cheek strength was normal. MOTOR: normal bulk and tone, full strength in the BUE, BLE, fine finger movements normal, no pronator drift SENSORY: normal and symmetric to light touch, pinprick, temperature. COORDINATION: finger-nose-finger, heel-to-shin bilaterally, there was no truncal ataxia REFLEXES: Brachioradialis 2/2, biceps 2/2, triceps 2/2, patellar 2/2, Achilles 2/2, plantar responses were flexor bilaterally. GAIT/STATION: Pushes up from seated position without assistance, still stance, without trunk ataxia, slow, wide based stride, using rolling walker. Unable to perform tiptoe, and heel walking or tandem.   DIAGNOSTIC DATA (LABS, IMAGING, TESTING) - I reviewed patient records, labs, notes, testing and imaging myself where available.  Lab Results  Component Value Date   WBC 9.2 04/08/2013   HGB 13.0 04/08/2013   HCT 39.4 04/08/2013   MCV 101.0* 04/08/2013   PLT 173 04/08/2013      Component Value Date/Time   NA 140 04/08/2013 1625   K 4.2 04/08/2013 1625   CL 95* 04/08/2013 1625   CO2 35* 04/08/2013 1625   GLUCOSE 110* 04/08/2013 1625   BUN 30* 04/08/2013 1625   CREATININE 1.18* 04/08/2013 1625   CALCIUM 9.7 04/08/2013 1625   PROT 6.9 04/08/2013 1625   ALBUMIN 3.5 04/08/2013 1625   AST 37 04/08/2013 1625   ALT 33 04/08/2013 1625   ALKPHOS 86 04/08/2013 1625   BILITOT 0.4 04/08/2013 1625   GFRNONAA 41* 04/08/2013 1625   GFRAA 47* 04/08/2013 1625   Lab Results  Component Value Date   CHOL  Value: 94        ATP III CLASSIFICATION:  <200     mg/dL   Desirable  161-096  mg/dL   Borderline High  >=045    mg/dL   High        02/05/8118   HDL 41 12/11/2010   LDLCALC  Value: 44        Total Cholesterol/HDL:CHD Risk Coronary Heart Disease Risk Table  Men   Women  1/2 Average Risk   3.4   3.3  Average Risk       5.0   4.4  2 X Average Risk   9.6   7.1  3 X Average  Risk  23.4   11.0        Use the calculated Patient Ratio above and the CHD Risk Table to determine the patient's CHD Risk.        ATP III CLASSIFICATION (LDL):  <100     mg/dL   Optimal  409-811  mg/dL   Near or Above                    Optimal  130-159  mg/dL   Borderline  914-782  mg/dL   High  >956     mg/dL   Very High 12/13/863   TRIG 47 12/11/2010   CHOLHDL 2.3 12/11/2010   Lab Results  Component Value Date   HGBA1C  Value: 6.3 (NOTE)                                                                       According to the ADA Clinical Practice Recommendations for 2011, when HbA1c is used as a screening test:   >=6.5%   Diagnostic of Diabetes Mellitus           (if abnormal result  is confirmed)  5.7-6.4%   Increased risk of developing Diabetes Mellitus  References:Diagnosis and Classification of Diabetes Mellitus,Diabetes Care,2011,34(Suppl 1):S62-S69 and Standards of Medical Care in         Diabetes - 2011,Diabetes Care,2011,34  (Suppl 1):S11-S61.* 12/12/2010   Lab Results  Component Value Date   VITAMINB12 >2000* 12/10/2010   Lab Results  Component Value Date   TSH 1.913 05/31/2012      ASSESSMENT AND PLAN [name] is a right-handed 79 year-old Caucasian female with left hemispheric subcortical infarct from cardiogenic embolism from new onset facial fibrillation in November 2011 who has done very well. She has gait difficulties at baseline from prior diabetic peripheral polyneuropathy which appears to be slightly worse after her stroke. Vascular risk factors of diabetes, hypertension, hyperlipidemia, atrial fibrillation and congestive heart failure.   Continue Pradaxa  for secondary stroke prevention and maintain strict control of hypertension with blood pressure goal below 130/90, diabetes with hemoglobin A1c goal below 6.5% and lipids with LDL cholesterol goal below 100 mg/dL.   We will order repeat Carotid dopplers.  Orders Placed This Encounter  Procedures  . US Carotid  Duplex Bilateral    Standing Status: Future     Number of Occurrences:      Standing Expiration Date: 07/20/2014    Order Specific Question:  Reason for Exam (SYMPTOM  OR DIAGNOSIS REQUIRED)    Answer:  stroke hx    Order Specific Question:  Preferred imaging location?    Answer:  Internal   Followup in 6 months.   Mizraim Harmening NP-C 05/19/2013, 1:34 PM  [facility] [address] [place], [place] [address] [phone]  I have personally examined this patient, reviewed pertinent data, developed plan of care and discussed with patient and agree with above.  Delia Heady, MD

## 2013-05-19 NOTE — Patient Instructions (Signed)
Carotid dopplers if not done in the last year.  Continue Pradaxa  for secondary stroke prevention and maintain strict control of hypertension with blood pressure goal below 130/90, diabetes with hemoglobin A1c goal below 6.5% and lipids with LDL cholesterol goal below 100 mg/dL. Followup in the future with me in 6 months.  STROKE/TIA INSTRUCTIONS SMOKING Cigarette smoking nearly doubles your risk of having a stroke & is the single most alterable risk factor  If you smoke or have smoked in the last 12 months, you are advised to quit smoking for your health.  Most of the excess cardiovascular risk related to smoking disappears within a year of stopping.  Ask you doctor about anti-smoking medications  [place] Quit Line: 1-800-QUIT NOW  Free Smoking Cessation Classes ([phone]  CHOLESTEROL Know your levels; limit fat & cholesterol in your diet  Lab Results  Component Value Date   CHOL  Value: 94        ATP III CLASSIFICATION:  <200     mg/dL   Desirable  161-096  mg/dL   Borderline High  >=045    mg/dL   High        02/05/8118   HDL 41 12/11/2010   LDLCALC  Value: 44        Total Cholesterol/HDL:CHD Risk Coronary Heart Disease Risk Table                     Men   Women  1/2 Average Risk   3.4   3.3  Average Risk       5.0   4.4  2 X Average Risk   9.6   7.1  3 X Average Risk  23.4   11.0        Use the calculated Patient Ratio above and the CHD Risk Table to determine the patient's CHD Risk.        ATP III CLASSIFICATION (LDL):  <100     mg/dL   Optimal  147-829  mg/dL   Near or Above                    Optimal  130-159  mg/dL   Borderline  562-130  mg/dL   High  >865     mg/dL   Very High 7/84/6962   TRIG 47 12/11/2010   CHOLHDL 2.3 12/11/2010      Many patients benefit from treatment even if their cholesterol is at goal.  Goal: Total Cholesterol less than 160  Goal:  LDL less than 100  Goal:  HDL greater than 40  Goal:  Triglycerides less than 150  BLOOD PRESSURE American Stroke  Association blood pressure target is less that 120/80 mm/Hg  Your discharge blood pressure is:  BP: 127/64 mmHg  Monitor your blood pressure  Limit your salt and alcohol intake  Many individuals will require more than one medication for high blood pressure  DIABETES (A1c is a blood sugar average for last 3 months) Goal A1c is under 7% (A1c is blood sugar average for last 3 months)  Diabetes: Diagnosis of diabetes:  A1c was not drawn this admission    Lab Results  Component Value Date   HGBA1C  Value: 6.3 (NOTE)  According to the ADA Clinical Practice Recommendations for 2011, when HbA1c is used as a screening test:   >=6.5%   Diagnostic of Diabetes Mellitus           (if abnormal result  is confirmed)  5.7-6.4%   Increased risk of developing Diabetes Mellitus  References:Diagnosis and Classification of Diabetes Mellitus,Diabetes Care,2011,34(Suppl 1):S62-S69 and Standards of Medical Care in         Diabetes - 2011,Diabetes Care,2011,34  (Suppl 1):S11-S61.* 12/12/2010    Your A1c can be lowered with medications, healthy diet, and exercise.  Check your blood sugar as directed by your physician  Call your physician if you experience unexplained or low blood sugars.  PHYSICAL ACTIVITY/REHABILITATION Goal is 30 minutes at least 4 days per week    Activity decreases your risk of heart attack and stroke and makes your heart stronger.  It helps control your weight and blood pressure; helps you relax and can improve your mood.  Participate in a regular exercise program.  Talk with your doctor about the best form of exercise for you (dancing, walking, swimming, cycling).  DIET/WEIGHT Goal is to maintain a healthy weight  Your height is:  Height: 5\' 5"  (165.1 cm) Your current weight is: Weight: 160 lb (72.576 kg) Your body Mass Index (BMI) is:  BMI (Calculated): 26.7  Following the type of diet specifically designed for you will  help prevent another stroke.  Your goal Body Mass Index (BMI) is 19-24.  Healthy food habits can help reduce 3 risk factors for stroke:  High cholesterol, hypertension, and excess weight.

## 2013-05-27 ENCOUNTER — Ambulatory Visit (INDEPENDENT_AMBULATORY_CARE_PROVIDER_SITE_OTHER): Payer: Medicare Other

## 2013-05-27 DIAGNOSIS — R269 Unspecified abnormalities of gait and mobility: Secondary | ICD-10-CM

## 2013-05-27 DIAGNOSIS — I639 Cerebral infarction, unspecified: Secondary | ICD-10-CM

## 2013-06-03 ENCOUNTER — Telehealth: Payer: Self-pay | Admitting: Nurse Practitioner

## 2013-06-03 NOTE — Telephone Encounter (Signed)
Left message with patient's daughter, negative study.

## 2013-06-18 ENCOUNTER — Other Ambulatory Visit: Payer: Self-pay | Admitting: Ophthalmology

## 2013-08-26 ENCOUNTER — Other Ambulatory Visit: Payer: Self-pay | Admitting: Cardiology

## 2013-09-29 ENCOUNTER — Other Ambulatory Visit: Payer: Self-pay | Admitting: General Surgery

## 2013-09-29 ENCOUNTER — Telehealth: Payer: Self-pay | Admitting: Cardiology

## 2013-09-29 DIAGNOSIS — I359 Nonrheumatic aortic valve disorder, unspecified: Secondary | ICD-10-CM

## 2013-09-29 NOTE — Telephone Encounter (Signed)
Echo 10/06/13 @ 4pm

## 2013-09-29 NOTE — Telephone Encounter (Signed)
New Problem:  Pt's daughter, [name], called to check on Echo appt time. She states that before [facility] moved her mom had an appt for this afternoon. I do not see an appt for this afternoon or orders for an Echo. Please place orders in Epic if pt needs an Echo. Pt's daughter is requesting a call back.

## 2013-09-29 NOTE — Telephone Encounter (Signed)
Pt was scheduled for 3:45 this afternoon for ECHO with Dx Code 424.1

## 2013-09-29 NOTE — Telephone Encounter (Signed)
Pts Emergency contact theresa stated they can not make that appt and requested a call back to reschedule. To Merita Norton to call and schedule.

## 2013-09-29 NOTE — Telephone Encounter (Signed)
Spoke with Scheduling and explained that the pt was not scheduled when we merged. We looked for first available ECHO 4:00 on Thursday 12/4

## 2013-10-01 ENCOUNTER — Ambulatory Visit (INDEPENDENT_AMBULATORY_CARE_PROVIDER_SITE_OTHER): Payer: Medicare Other | Admitting: Podiatrist

## 2013-10-01 ENCOUNTER — Encounter: Payer: Self-pay | Admitting: Podiatrist

## 2013-10-01 VITALS — BP 145/76 | HR 77 | Resp 16

## 2013-10-01 DIAGNOSIS — M79609 Pain in unspecified limb: Secondary | ICD-10-CM

## 2013-10-01 DIAGNOSIS — B351 Tinea unguium: Secondary | ICD-10-CM

## 2013-10-01 DIAGNOSIS — M109 Gout, unspecified: Secondary | ICD-10-CM

## 2013-10-01 NOTE — Patient Instructions (Signed)
Diabetes and Foot Care Diabetes may cause you to have problems because of poor blood supply (circulation) to your feet and legs. This may cause the skin on your feet to become thinner, break easier, and heal more slowly. Your skin may become dry, and the skin may peel and crack. You may also have nerve damage in your legs and feet causing decreased feeling in them. You may not notice minor injuries to your feet that could lead to infections or more serious problems. Taking care of your feet is one of the most important things you can do for yourself.  HOME CARE INSTRUCTIONS  Wear shoes at all times, even in the house. Do not go barefoot. Bare feet are easily injured.  Check your feet daily for blisters, cuts, and redness. If you cannot see the bottom of your feet, use a mirror or ask someone for help.  Wash your feet with warm water (do not use hot water) and mild soap. Then pat your feet and the areas between your toes until they are completely dry. Do not soak your feet as this can dry your skin.  Apply a moisturizing lotion or petroleum jelly (that does not contain alcohol and is unscented) to the skin on your feet and to dry, brittle toenails. Do not apply lotion between your toes.  Trim your toenails straight across. Do not dig under them or around the cuticle. File the edges of your nails with an emery board or nail file.  Do not cut corns or calluses or try to remove them with medicine.  Wear clean socks or stockings every day. Make sure they are not too tight. Do not wear knee-high stockings since they may decrease blood flow to your legs.  Wear shoes that fit properly and have enough cushioning. To break in new shoes, wear them for just a few hours a day. This prevents you from injuring your feet. Always look in your shoes before you put them on to be sure there are no objects inside.  Do not cross your legs. This may decrease the blood flow to your feet.  If you find a minor scrape,  cut, or break in the skin on your feet, keep it and the skin around it clean and dry. These areas may be cleansed with mild soap and water. Do not cleanse the area with peroxide, alcohol, or iodine.  When you remove an adhesive bandage, be sure not to damage the skin around it.  If you have a wound, look at it several times a day to make sure it is healing.  Do not use heating pads or hot water bottles. They may burn your skin. If you have lost feeling in your feet or legs, you may not know it is happening until it is too late.  Make sure your health care provider performs a complete foot exam at least annually or more often if you have foot problems. Report any cuts, sores, or bruises to your health care provider immediately. SEEK MEDICAL CARE IF:   You have an injury that is not healing.  You have cuts or breaks in the skin.  You have an ingrown nail.  You notice redness on your legs or feet.  You feel burning or tingling in your legs or feet.  You have pain or cramps in your legs and feet.  Your legs or feet are numb.  Your feet always feel cold. SEEK IMMEDIATE MEDICAL CARE IF:   There is increasing redness,   swelling, or pain in or around a wound.  There is a red line that goes up your leg.  Pus is coming from a wound.  You develop a fever or as directed by your health care provider.  You notice a bad smell coming from an ulcer or wound. Document Released: 10/13/2000 Document Revised: 06/18/2013 Document Reviewed: 03/25/2013 Exit[employer] Patient Information 2014 [employer].  

## 2013-10-01 NOTE — Progress Notes (Signed)
HPI:  Patient presents today for follow up of foot and nail care. Denies any new complaints today. Denies any gout flares or episodes recently.  Objective:  Patients chart is reviewed.  Neurovascular status unchanged.  Patients nails are thickened, discolored, distrophic, friable and brittle with yellow-brown discoloration. Patient subjectively relates they are painful with shoes and with ambulation of bilateral feet.  Assessment:  Symptomatic onychomycosis  Plan:  Discussed treatment options and alternatives.  The symptomatic toenails were debrided through manual an mechanical means without complication.  Return appointment recommended at routine intervals of 3 months    Marlowe Aschoff, DPM

## 2013-10-02 ENCOUNTER — Other Ambulatory Visit (HOSPITAL_COMMUNITY): Payer: Medicare Other

## 2013-10-02 DIAGNOSIS — M109 Gout, unspecified: Secondary | ICD-10-CM | POA: Insufficient documentation

## 2013-10-06 ENCOUNTER — Ambulatory Visit (HOSPITAL_COMMUNITY): Payer: Medicare Other | Attending: Cardiology | Admitting: Radiology

## 2013-10-06 DIAGNOSIS — R0989 Other specified symptoms and signs involving the circulatory and respiratory systems: Secondary | ICD-10-CM | POA: Insufficient documentation

## 2013-10-06 DIAGNOSIS — I4891 Unspecified atrial fibrillation: Secondary | ICD-10-CM | POA: Insufficient documentation

## 2013-10-06 DIAGNOSIS — I079 Rheumatic tricuspid valve disease, unspecified: Secondary | ICD-10-CM | POA: Insufficient documentation

## 2013-10-06 DIAGNOSIS — I379 Nonrheumatic pulmonary valve disorder, unspecified: Secondary | ICD-10-CM | POA: Insufficient documentation

## 2013-10-06 DIAGNOSIS — I080 Rheumatic disorders of both mitral and aortic valves: Secondary | ICD-10-CM | POA: Insufficient documentation

## 2013-10-06 DIAGNOSIS — R0609 Other forms of dyspnea: Secondary | ICD-10-CM | POA: Insufficient documentation

## 2013-10-06 DIAGNOSIS — E119 Type 2 diabetes mellitus without complications: Secondary | ICD-10-CM | POA: Insufficient documentation

## 2013-10-06 DIAGNOSIS — I359 Nonrheumatic aortic valve disorder, unspecified: Secondary | ICD-10-CM

## 2013-10-06 DIAGNOSIS — I509 Heart failure, unspecified: Secondary | ICD-10-CM | POA: Insufficient documentation

## 2013-10-06 DIAGNOSIS — I10 Essential (primary) hypertension: Secondary | ICD-10-CM | POA: Insufficient documentation

## 2013-10-06 DIAGNOSIS — J45909 Unspecified asthma, uncomplicated: Secondary | ICD-10-CM | POA: Insufficient documentation

## 2013-10-06 NOTE — Progress Notes (Signed)
Echocardiogram performed.  

## 2013-10-27 ENCOUNTER — Encounter: Payer: Self-pay | Admitting: General Surgery

## 2013-10-27 DIAGNOSIS — I110 Hypertensive heart disease with heart failure: Secondary | ICD-10-CM

## 2013-10-27 DIAGNOSIS — Q2111 Secundum atrial septal defect: Secondary | ICD-10-CM

## 2013-10-27 DIAGNOSIS — I359 Nonrheumatic aortic valve disorder, unspecified: Secondary | ICD-10-CM

## 2013-10-27 DIAGNOSIS — I10 Essential (primary) hypertension: Secondary | ICD-10-CM | POA: Insufficient documentation

## 2013-10-27 DIAGNOSIS — Q211 Atrial septal defect: Secondary | ICD-10-CM

## 2013-11-06 ENCOUNTER — Encounter: Payer: Self-pay | Admitting: Cardiology

## 2013-11-06 ENCOUNTER — Ambulatory Visit (INDEPENDENT_AMBULATORY_CARE_PROVIDER_SITE_OTHER): Payer: Medicare Other | Admitting: Cardiology

## 2013-11-06 VITALS — BP 118/60 | HR 76 | Ht 64.0 in | Wt 163.0 lb

## 2013-11-06 DIAGNOSIS — I059 Rheumatic mitral valve disease, unspecified: Secondary | ICD-10-CM

## 2013-11-06 DIAGNOSIS — I482 Chronic atrial fibrillation, unspecified: Secondary | ICD-10-CM

## 2013-11-06 DIAGNOSIS — Q211 Atrial septal defect, unspecified: Secondary | ICD-10-CM | POA: Insufficient documentation

## 2013-11-06 DIAGNOSIS — I340 Nonrheumatic mitral (valve) insufficiency: Secondary | ICD-10-CM | POA: Insufficient documentation

## 2013-11-06 DIAGNOSIS — I4891 Unspecified atrial fibrillation: Secondary | ICD-10-CM

## 2013-11-06 DIAGNOSIS — I10 Essential (primary) hypertension: Secondary | ICD-10-CM

## 2013-11-06 DIAGNOSIS — G4733 Obstructive sleep apnea (adult) (pediatric): Secondary | ICD-10-CM

## 2013-11-06 DIAGNOSIS — Q2111 Secundum atrial septal defect: Secondary | ICD-10-CM

## 2013-11-06 DIAGNOSIS — I5030 Unspecified diastolic (congestive) heart failure: Secondary | ICD-10-CM

## 2013-11-06 DIAGNOSIS — I359 Nonrheumatic aortic valve disorder, unspecified: Secondary | ICD-10-CM

## 2013-11-06 DIAGNOSIS — I350 Nonrheumatic aortic (valve) stenosis: Secondary | ICD-10-CM

## 2013-11-06 NOTE — Progress Notes (Signed)
[address], [address] [place], [place]  [address] Phone: [phone] Fax:  [phone]  Date:  11/06/2013   ID:  [name], DOB 10-22-1924, MRN [id number]  PCP:  Ginette Otto, MD  Cardiologist:  Armanda Magic, MD     History of Present Illness: [name] is a 80 y.o. female with a history of chronic diastolic CHF, OSA on CPAP, HTN, chronic atrial fibrillation, moderate AS, mild pulmonary HTN, moderate MR and ASD who presents today for followup.  She is doing well.  She denies any chest pain, SOB, DOE, palpitations or syncope.  She has chronic LE edema which is stable.  Her last echo showed worsening of her MR now at moderate to severe.  She uses her CPAP and does well with it.  She tolerates her mask and pressure feels adequate.  She feels rested in the am and has no daytime sleepiness.   Wt Readings from Last 3 Encounters:  10/27/13 160 lb (72.576 kg)  05/19/13 160 lb (72.576 kg)  10/15/12 160 lb (72.576 kg)     Past Medical History  Diagnosis Date  . Hypertension   . Peripheral neuropathy   . Asthma   . High cholesterol   . Diabetes with neurologic complications   . PAD (peripheral artery disease)   . Enlarged heart   . Anginal pain 05/28/12  . Pneumonia 11/2010  . Sleep apnea     cpap  . Hypothyroidism   . Headache(784.0) 05/29/12    "recently"  . Stroke 05/2005; 08/2010    !mini"  . Personal history of gout   . Hypothyroidism   . DJD (degenerative joint disease)   . Carpal tunnel syndrome, bilateral   . Recurrent labyrinthitis   . GERD (gastroesophageal reflux disease)     S/P esophageal dilation in 1996  . Gallstone     Silent  . Fibrocystic breast changes   . Hemorrhoids     with bleeding  . Hx of adenomatous colonic polyps     Dr Sherin Quarry  . Diverticulitis     left side  . Mild anemia     hemoglobin 11.8 on 06/2009  . Edema of lower extremity     Chronic  . Ischemic colitis 8/13  . ASD     with right to left shunting  . Aortic  stenosis, moderate   . Chronic diastolic CHF (congestive heart failure)   . Moderate to severe mitral regurgitation by echo 09/2013   . CKD (chronic kidney disease) stage 3, GFR 30-59 ml/min     "something on labs always say she has some problems"  . Chronic atrial fibrillation     Current Outpatient Prescriptions  Medication Sig Dispense Refill  . acetaminophen (TYLENOL) 500 MG tablet Take 500 mg by mouth every 6 (six) hours as needed for pain.      Marland Kitchen allopurinol (ZYLOPRIM) 300 MG tablet Take 300 mg by mouth daily.      Marland Kitchen amiodarone (PACERONE) 200 MG tablet Take 1 tablet (200 mg total) by mouth daily.  30 tablet  1  . atorvastatin (LIPITOR) 10 MG tablet Take 10 mg by mouth daily.      . Calcium Carbonate-Vitamin D (CALCIUM-VITAMIN D) 600-200 MG-UNIT CAPS Take 1 tablet by mouth 2 (two) times daily.      . dabigatran (PRADAXA) 75 MG CAPS Take 75 mg by mouth every 12 (twelve) hours.      . furosemide (LASIX) 80 MG tablet TAKE 1/2 TABLET  BY MOUTH EVERY DAY  15 tablet  6  . gabapentin (NEURONTIN) 300 MG capsule Take 300 mg by mouth at bedtime.      . Garlic (GARLIQUE) 400 MG TBEC Take 400 mg by mouth daily.       Marland Kitchen. Ketotifen Fumarate (ZADITOR OP) Place 1 drop into both eyes daily as needed (for dry eyes).      . L-methylfolate Calcium 7.5 MG TABS Take 1 tablet by mouth 2 (two) times daily.      Marland Kitchen. levothyroxine (SYNTHROID, LEVOTHROID) 88 MCG tablet Take 88 mcg by mouth daily.      . metoprolol succinate (TOPROL-XL) 25 MG 24 hr tablet Take 0.5 tablets (12.5 mg total) by mouth daily.  15 tablet  1  . Multiple Vitamins-Minerals (MULTIVITAMIN WITH MINERALS) tablet Take 1 tablet by mouth daily.      . Omega-3 Fatty Acids (FISH OIL) 1000 MG CAPS Take 300 mg by mouth daily.       Marland Kitchen. omeprazole (PRILOSEC) 20 MG capsule Take 20 mg by mouth daily.      Marland Kitchen. oxybutynin (DITROPAN) 5 MG tablet Take 5 mg by mouth.      . potassium chloride (K-DUR,KLOR-CON) 10 MEQ tablet Take 10 mEq by mouth 2 (two) times daily.       . Probiotic Product (PROBIOTIC DAILY PO) Take 1 tablet by mouth 2 (two) times daily.       . vitamin C (ASCORBIC ACID) 500 MG tablet Take 500 mg by mouth daily.      . zaleplon (SONATA) 5 MG capsule Take 10 mg by mouth at bedtime.        No current facility-administered medications for this visit.    Allergies:    Allergies  Allergen Reactions  . Aspirin Other (See Comments)    Tears my stomach up; "can take coated Aspirin qd without problems"  . Nexium [Esomeprazole Magnesium] Rash  . Penicillins Rash    Tolerates Rocephin    Social History:  The patient  reports that she has never smoked. She has never used smokeless tobacco. She reports that she does not drink alcohol or use illicit drugs.   Family History:  The patient's family history includes Heart disease in her brother, father, and mother.   ROS:  Please see the history of present illness.      All other systems reviewed and negative.   PHYSICAL EXAM: VS:  There were no vitals taken for this visit. Well nourished, well developed, in no acute distress HEENT: normal Neck: no JVD Cardiac:  normal S1, S2; RRR; 2/6 SM at RUSB radiating to LLSB Lungs:  clear to auscultation bilaterally, no wheezing, rhonchi or rales Abd: soft, nontender, no hepatomegaly Ext: trace edema Skin: warm and dry Neuro:  CNs 2-12 intact, no focal abnormalities noted    ASSESSMENT AND PLAN:  1. Chronic diastolic CHF  - continue metoprolol/Lasix 2. HTN  - continue metoprolol 3. ASD 4. Chronic atrial fibrillation on low dose amio for rate control  - continue Pradaxa/metoprolol/amiodarone  - check TSH/LFTs/PFTs 5. OSA on CPAP - her d/l today showed an AHI of 1.8/hr on 11cm H2O and 92% compliant in using more than 4 hours nightly.  She will continue on current settings.,  6. Moderate AS 7. Moderate to severe MR - she has had worsening of her MR since her last echo.  She is very debilitated and walks very slowly with a walker.  I do not think  she is a surgical  candidate for MV repair or MVR due to severe debilitation.  This was discussed with patient and her daughter.  [name] and her family do not want to consider surgical repair at any time.  She is asymptomatic.   8. Mild pulmonary HTN secondary to diastolic dysfunction/MR/AS/OSA  Followup with me in 6 months  Signed, Armanda Magic, MD 11/06/2013 4:15 PM

## 2013-11-06 NOTE — Patient Instructions (Signed)
Your physician recommends that you continue on your current medications as directed. Please refer to the Current Medication list given to you today.  Your physician recommends that you go to the lab today for ALT and TSH  Your physician has recommended that you have a pulmonary function test. Pulmonary Function Tests are a group of tests that measure how well air moves in and out of your lungs.  Your physician wants you to follow-up in: 6 Months with Dr Sherlyn Lickurner You will receive a reminder letter in the mail two months in advance. If you don't receive a letter, please call our office to schedule the follow-up appointment.

## 2013-11-07 LAB — ALT: ALT: 53 U/L — ABNORMAL HIGH (ref 0–35)

## 2013-11-07 LAB — TSH: TSH: 2.62 u[IU]/mL (ref 0.35–5.50)

## 2013-11-17 ENCOUNTER — Encounter: Payer: Self-pay | Admitting: Nurse Practitioner

## 2013-11-17 ENCOUNTER — Ambulatory Visit (INDEPENDENT_AMBULATORY_CARE_PROVIDER_SITE_OTHER): Payer: Medicare Other | Admitting: Nurse Practitioner

## 2013-11-17 ENCOUNTER — Encounter (INDEPENDENT_AMBULATORY_CARE_PROVIDER_SITE_OTHER): Payer: Self-pay

## 2013-11-17 VITALS — BP 139/72 | HR 72 | Temp 97.9°F | Ht 67.0 in | Wt 158.0 lb

## 2013-11-17 DIAGNOSIS — R269 Unspecified abnormalities of gait and mobility: Secondary | ICD-10-CM

## 2013-11-17 DIAGNOSIS — Z8673 Personal history of transient ischemic attack (TIA), and cerebral infarction without residual deficits: Secondary | ICD-10-CM

## 2013-11-17 NOTE — Patient Instructions (Addendum)
Continue Pradaxa for atrial fibrillation and secondary stroke prevention and maintain strict control of hypertension with blood pressure goal below 130/90, and lipids with LDL cholesterol goal below 100 mg/dL.   Follow up in 6 months.

## 2013-11-17 NOTE — Progress Notes (Signed)
PATIENT: [name] DOB: [birth year]   REASON FOR VISIT: follow up for stroke HISTORY FROM: patient  HISTORY OF PRESENT ILLNESS: [name] is a right-handed 80 year-old Caucasian female with left hemispheric subcortical infarct from cardiogenic embolism from new onset facial fibrillation in November 2011 who has done very well. She has gait difficulties at baseline from prior diabetic peripheral polyneuropathy which appears to be slightly worse after her stroke. Vascular risk factors of diabetes, hypertension, hyperlipidemia, atrial fibrillation and congestive heart failure.   Returns for followup since last visit on 04/15/2012. She's doing well without recurrent neurovascular symptoms. Her blood sugars are ranging 109-148 and she has been discontinued from one of her antidiabetic medications. Her blood pressure has been 120s systolic. Continues to tolerate Doxil without significant bruising, also on aspirin 81 mg. Her last carotid Doppler was on 12/27/2011. Her son only complains that she has no interest in activities. She denies feeling depressed.  Update 12/17 2013: She returns for followup after last visit on 10/03/2011. She continues to do well without any recurrent neurovascular symptoms. She's tolerating Pradaxa without major bleeding but has easy bruisability. She has brought along her blood pressure and sugar log with her. Blood pressure consistently runs between 110s to 120s range. Fasting sugars have ranged from 115-148 mg percent. She had lipid profile checked a month ago by Dr. Pete Glatter and it was apparently fine though I do not have those results. She had carotid ultrasound done on 12/27/2011 which showed minor plaques without hemodynamically significant stenosis. She has no new complaints today.   UPDATE 05/19/13: Returns for followup after last visit on 10/15/2012. She continues to do well without any recurrent neurovascular symptoms. She's tolerating Pradaxa  without major bleeding but has easy bruisability. She has brought along her blood pressure and sugar log with her. Blood pressure consistently runs between 110s to 120s range. Fasting sugars have ranged from 100-209 mg percent. Last hemoglobin A1c was 6.3. She ambulates with a rolling walker at all times and has a slow but steady gait. She lives at home alone but has a 24-hour caregiver with her at all times. She went to the hospital in early June for abdominal pain and cardiac cause was ruled out. She was not admitted and felt better quickly. She and her daughter do not think her carotid Doppler has been repeated since February 2013.   UPDATE 11/17/13 (LL): Patient returns for stroke follow up.  Repeat Carotid dopplers on 05/27/13 were negative for stenosis.  She's tolerating Pradaxa without major bleeding but has easy bruisability. She states her BP is well controlled, it is 139/72 in office today.  She has brought along her blood pressure and sugar log with her.  Her blood sugars are well controlled without medication.  She denies falls at home, using a rolling walker at all times.   REVIEW OF SYSTEMS: Full 14 system review of systems performed and notable only for:  Eye itching, shortness of breath, rectal bleeding, diarrhea, apnea, frequent waking, incontinence of bladder, frequency of urination, walking difficulty, dizziness, Easy bruising easy bleeding.  ALLERGIES: Allergies  Allergen Reactions  . Aspirin Other (See Comments)    Tears my stomach up; "can take coated Aspirin qd without problems"  . Nexium [Esomeprazole Magnesium] Rash  . Penicillins Rash    Tolerates Rocephin    HOME MEDICATIONS: Outpatient Prescriptions Prior to Visit  Medication Sig Dispense Refill  . acetaminophen (TYLENOL) 500 MG tablet Take 500 mg by mouth every 6 (  six) hours as needed for pain.      Marland Kitchen. allopurinol (ZYLOPRIM) 300 MG tablet Take 300 mg by mouth daily.      Marland Kitchen. amiodarone (PACERONE) 200 MG tablet Take 1  tablet (200 mg total) by mouth daily.  30 tablet  1  . atorvastatin (LIPITOR) 10 MG tablet Take 10 mg by mouth daily.      . Calcium Carbonate-Vitamin D (CALCIUM-VITAMIN D) 600-200 MG-UNIT CAPS Take 1 tablet by mouth daily.       . dabigatran (PRADAXA) 75 MG CAPS Take 75 mg by mouth every 12 (twelve) hours.      . furosemide (LASIX) 80 MG tablet TAKE 1/2 TABLET BY MOUTH EVERY DAY  15 tablet  6  . gabapentin (NEURONTIN) 300 MG capsule Take 300 mg by mouth at bedtime.      . Garlic (GARLIQUE) 400 MG TBEC Take 400 mg by mouth daily.       Marland Kitchen. Ketotifen Fumarate (ZADITOR OP) Place 1 drop into both eyes daily as needed (for dry eyes).      . L-methylfolate Calcium 7.5 MG TABS Take 1 tablet by mouth 2 (two) times daily.      . Lancets (FREESTYLE) lancets       . levothyroxine (SYNTHROID, LEVOTHROID) 88 MCG tablet Take 88 mcg by mouth daily.      . metoprolol succinate (TOPROL-XL) 25 MG 24 hr tablet Take 0.5 tablets (12.5 mg total) by mouth daily.  15 tablet  1  . Multiple Vitamins-Minerals (MULTIVITAMIN WITH MINERALS) tablet Take 1 tablet by mouth daily.      . Omega-3 Fatty Acids (FISH OIL) 1000 MG CAPS Take 300 mg by mouth daily.       Marland Kitchen. oxybutynin (DITROPAN) 5 MG tablet Take 10 mg by mouth.       . polycarbophil (FIBERCON) 625 MG tablet Take 625 mg by mouth 2 (two) times daily.      . potassium chloride (K-DUR,KLOR-CON) 10 MEQ tablet Take 10 mEq by mouth 2 (two) times daily.      . Probiotic Product (PROBIOTIC DAILY PO) Take 1 tablet by mouth daily.       . vitamin C (ASCORBIC ACID) 500 MG tablet Take 500 mg by mouth daily.      . zaleplon (SONATA) 5 MG capsule Take 10 mg by mouth at bedtime.        No facility-administered medications prior to visit.    PAST MEDICAL HISTORY: Past Medical History  Diagnosis Date  . Hypertension   . Peripheral neuropathy   . Asthma   . High cholesterol   . Diabetes with neurologic complications   . PAD (peripheral artery disease)   . Enlarged heart   .  Anginal pain 05/28/12  . Pneumonia 11/2010  . Sleep apnea     cpap  . Hypothyroidism   . Headache(784.0) 05/29/12    "recently"  . Stroke 05/2005; 08/2010    !mini"  . Personal history of gout   . Hypothyroidism   . DJD (degenerative joint disease)   . Carpal tunnel syndrome, bilateral   . Recurrent labyrinthitis   . GERD (gastroesophageal reflux disease)     S/P esophageal dilation in 1996  . Gallstone     Silent  . Fibrocystic breast changes   . Hemorrhoids     with bleeding  . Hx of adenomatous colonic polyps     Dr Sherin QuarryWeissman  . Diverticulitis     left side  . MR (mitral  regurgitation)     moderate  . Mild anemia     hemoglobin 11.8 on 06/2009  . Edema of lower extremity     Chronic  . Ischemic colitis 8/13  . Aortic stenosis, moderate   . Chronic diastolic CHF (congestive heart failure)   . Moderate mitral regurgitation   . CKD (chronic kidney disease) stage 3, GFR 30-59 ml/min     "something on labs always say she has some problems"  . Chronic atrial fibrillation     PAST SURGICAL HISTORY: Past Surgical History  Procedure Laterality Date  . Tonsillectomy and adenoidectomy  1960's  . Dilation and curettage of uterus    . Vaginal hysterectomy  1970's  . Total knee arthroplasty  1970-~2002    left; right  . Cataract extraction w/ intraocular lens  implant, bilateral  ?1970's    FAMILY HISTORY: Family History  Problem Relation Age of Onset  . Heart disease Mother   . Heart disease Father   . Heart disease Brother     SOCIAL HISTORY: History   Social History  . Marital Status: Widowed    Spouse Name: N/A    Number of Children: 3  . Years of Education: N/A   Occupational History  . RETIRED     [employer]   Social History Main Topics  . Smoking status: Never Smoker   . Smokeless tobacco: Never Used  . Alcohol Use: No  . Drug Use: No  . Sexual Activity: No   Other Topics Concern  . Not on file   Social History Narrative   Retired. Lives alone. 3  adult children. No tobacco use. Nondrinker. No illicit drug use.      PHYSICAL EXAM  Filed Vitals:   11/17/13 1405  Height: 5\' 7"  (1.702 m)  Weight: 158 lb (71.668 kg)   Body mass index is 24.74 kg/(m^2).  Generalized: In no acute distress  Neck: Supple, no carotid bruits  Cardiac: Irregular rate and rhythm, no murmur  Pulmonary: Clear to auscultation bilaterally  Musculoskeletal: No deformity   Neurological examination  Mentation: Alert oriented to time, place, history taking, language fluent, and casual conversation  Cranial nerve II-XII: Pupils were equal round reactive to light extraocular movements were full, visual field were full on confrontational test. facial sensation and strength were normal. hearing was intact to finger rubbing bilaterally. Uvula tongue midline. head turning and shoulder shrug and were normal and symmetric. MOTOR: normal bulk and tone, full strength in the BUE, BLE, fine finger movements normal, no pronator drift  SENSORY: normal and symmetric to light touch, pinprick, temperature.  COORDINATION: finger-nose-finger, heel-to-shin bilaterally, there was no truncal ataxia  REFLEXES: Brachioradialis 2/2, biceps 2/2, triceps 2/2, patellar 2/2, Achilles 2/2, plantar responses were flexor bilaterally.  GAIT/STATION: Pushes up from seated position without assistance, still stance, without trunk ataxia, slow, wide based stride, using rolling walker. Unable to perform tiptoe, and heel walking or tandem.   DIAGNOSTIC DATA (LABS, IMAGING, TESTING) - I reviewed patient records, labs, notes, testing and imaging myself where available.  Lab Results  Component Value Date   WBC 9.2 04/08/2013   HGB 13.0 04/08/2013   HCT 39.4 04/08/2013   MCV 101.0* 04/08/2013   PLT 173 04/08/2013      Component Value Date/Time   NA 140 04/08/2013 1625   K 4.2 04/08/2013 1625   CL 95* 04/08/2013 1625   CO2 35* 04/08/2013 1625   GLUCOSE 110* 04/08/2013 1625   BUN 30* 04/08/2013 1625  CREATININE 1.18* 04/08/2013 1625   CALCIUM 9.7 04/08/2013 1625   PROT 6.9 04/08/2013 1625   ALBUMIN 3.5 04/08/2013 1625   AST 37 04/08/2013 1625   ALT 53* 11/06/2013 1647   ALKPHOS 86 04/08/2013 1625   BILITOT 0.4 04/08/2013 1625   GFRNONAA 41* 04/08/2013 1625   GFRAA 47* 04/08/2013 1625   Lab Results  Component Value Date   TSH 2.62 11/06/2013    ASSESSMENT AND PLAN [name] is a right-handed 80 year-old Caucasian female with left hemispheric subcortical infarct from cardiogenic embolism from new onset atrial fibrillation in November 2011 who has done very well. She has gait difficulties at baseline from prior diabetic peripheral polyneuropathy which appears to be slightly worse after her stroke. Vascular risk factors of diabetes, hypertension, hyperlipidemia, atrial fibrillation and congestive heart failure.   PLAN: Continue Pradaxa for atrial fibrillation and secondary stroke prevention and maintain strict control of hypertension with blood pressure goal below 130/90, and lipids with LDL cholesterol goal below 100 mg/dL.  Follow up in 6 months.  Ronal Fear, MSN, NP-C 11/17/2013, 2:05 PM [facility] [address] [place], [place] [address] [phone]  Note: This document was prepared with digital dictation and possible smart phrase technology. Any transcriptional errors that result from this process are unintentional.

## 2013-12-11 ENCOUNTER — Encounter: Payer: Self-pay | Admitting: Cardiology

## 2013-12-11 ENCOUNTER — Ambulatory Visit (INDEPENDENT_AMBULATORY_CARE_PROVIDER_SITE_OTHER): Payer: Medicare Other | Admitting: Internal Medicine

## 2013-12-11 ENCOUNTER — Encounter: Payer: Self-pay | Admitting: General Surgery

## 2013-12-11 DIAGNOSIS — I4891 Unspecified atrial fibrillation: Secondary | ICD-10-CM

## 2013-12-11 DIAGNOSIS — I482 Chronic atrial fibrillation, unspecified: Secondary | ICD-10-CM

## 2013-12-11 LAB — PULMONARY FUNCTION TEST
FEF 25-75 POST: 1.36 L/s
FEF 25-75 PRE: 1.33 L/s
FEF2575-%CHANGE-POST: 1 %
FEF2575-%PRED-POST: 112 %
FEF2575-%PRED-PRE: 111 %
FEV1-%Change-Post: 3 %
FEV1-%PRED-PRE: 77 %
FEV1-%Pred-Post: 80 %
FEV1-POST: 1.58 L
FEV1-PRE: 1.52 L
FEV1FVC-%CHANGE-POST: -7 %
FEV1FVC-%PRED-PRE: 108 %
FEV6-%CHANGE-POST: 12 %
FEV6-%PRED-POST: 87 %
FEV6-%Pred-Pre: 77 %
FEV6-Post: 2.18 L
FEV6-Pre: 1.94 L
FEV6FVC-%Change-Post: 0 %
FEV6FVC-%Pred-Post: 105 %
FEV6FVC-%Pred-Pre: 105 %
FVC-%Change-Post: 12 %
FVC-%PRED-POST: 82 %
FVC-%Pred-Pre: 73 %
FVC-POST: 2.19 L
FVC-Pre: 1.95 L
POST FEV1/FVC RATIO: 72 %
PRE FEV1/FVC RATIO: 78 %
Post FEV6/FVC ratio: 99 %
Pre FEV6/FVC Ratio: 100 %

## 2013-12-11 NOTE — Progress Notes (Signed)
Before and after done today. This pt was unable to perform lung volumes and DLCO.

## 2013-12-19 ENCOUNTER — Telehealth: Payer: Self-pay | Admitting: Cardiology

## 2013-12-19 NOTE — Telephone Encounter (Signed)
New message  Patient's daughter is returning your call. Please call back.

## 2013-12-19 NOTE — Telephone Encounter (Signed)
Returned daughter's call.

## 2013-12-23 ENCOUNTER — Telehealth: Payer: Self-pay

## 2013-12-23 NOTE — Telephone Encounter (Signed)
Will need to call back in am as their office closed early today due to weather; If they look on PFT results (first page) it states that the patient could not complete DLCO or lung volumes after several attempts.

## 2013-12-24 NOTE — Telephone Encounter (Signed)
ATC and was advised office was closed. Will open at 10. wcb

## 2013-12-26 NOTE — Telephone Encounter (Signed)
Called and spoke with danielle. They already saw our message. Nothing further needed

## 2013-12-26 NOTE — Telephone Encounter (Signed)
ATC, [facility]

## 2013-12-30 ENCOUNTER — Other Ambulatory Visit: Payer: Self-pay | Admitting: *Deleted

## 2013-12-30 DIAGNOSIS — I5030 Unspecified diastolic (congestive) heart failure: Secondary | ICD-10-CM

## 2013-12-31 ENCOUNTER — Ambulatory Visit (INDEPENDENT_AMBULATORY_CARE_PROVIDER_SITE_OTHER): Payer: Medicare Other | Admitting: Podiatrist

## 2013-12-31 ENCOUNTER — Encounter: Payer: Self-pay | Admitting: Podiatrist

## 2013-12-31 ENCOUNTER — Ambulatory Visit: Payer: Medicare Other | Admitting: Podiatrist

## 2013-12-31 VITALS — BP 137/85 | HR 76 | Resp 16

## 2013-12-31 DIAGNOSIS — E1149 Type 2 diabetes mellitus with other diabetic neurological complication: Secondary | ICD-10-CM

## 2013-12-31 DIAGNOSIS — M79609 Pain in unspecified limb: Secondary | ICD-10-CM

## 2013-12-31 DIAGNOSIS — B351 Tinea unguium: Secondary | ICD-10-CM

## 2013-12-31 NOTE — Patient Instructions (Signed)
Diabetes and Foot Care Diabetes may cause you to have problems because of poor blood supply (circulation) to your feet and legs. This may cause the skin on your feet to become thinner, break easier, and heal more slowly. Your skin may become dry, and the skin may peel and crack. You may also have nerve damage in your legs and feet causing decreased feeling in them. You may not notice minor injuries to your feet that could lead to infections or more serious problems. Taking care of your feet is one of the most important things you can do for yourself.  HOME CARE INSTRUCTIONS  Wear shoes at all times, even in the house. Do not go barefoot. Bare feet are easily injured.  Check your feet daily for blisters, cuts, and redness. If you cannot see the bottom of your feet, use a mirror or ask someone for help.  Wash your feet with warm water (do not use hot water) and mild soap. Then pat your feet and the areas between your toes until they are completely dry. Do not soak your feet as this can dry your skin.  Apply a moisturizing lotion or petroleum jelly (that does not contain alcohol and is unscented) to the skin on your feet and to dry, brittle toenails. Do not apply lotion between your toes.  Trim your toenails straight across. Do not dig under them or around the cuticle. File the edges of your nails with an emery board or nail file.  Do not cut corns or calluses or try to remove them with medicine.  Wear clean socks or stockings every day. Make sure they are not too tight. Do not wear knee-high stockings since they may decrease blood flow to your legs.  Wear shoes that fit properly and have enough cushioning. To break in new shoes, wear them for just a few hours a day. This prevents you from injuring your feet. Always look in your shoes before you put them on to be sure there are no objects inside.  Do not cross your legs. This may decrease the blood flow to your feet.  If you find a minor scrape,  cut, or break in the skin on your feet, keep it and the skin around it clean and dry. These areas may be cleansed with mild soap and water. Do not cleanse the area with peroxide, alcohol, or iodine.  When you remove an adhesive bandage, be sure not to damage the skin around it.  If you have a wound, look at it several times a day to make sure it is healing.  Do not use heating pads or hot water bottles. They may burn your skin. If you have lost feeling in your feet or legs, you may not know it is happening until it is too late.  Make sure your health care provider performs a complete foot exam at least annually or more often if you have foot problems. Report any cuts, sores, or bruises to your health care provider immediately. SEEK MEDICAL CARE IF:   You have an injury that is not healing.  You have cuts or breaks in the skin.  You have an ingrown nail.  You notice redness on your legs or feet.  You feel burning or tingling in your legs or feet.  You have pain or cramps in your legs and feet.  Your legs or feet are numb.  Your feet always feel cold. SEEK IMMEDIATE MEDICAL CARE IF:   There is increasing redness,   swelling, or pain in or around a wound.  There is a red line that goes up your leg.  Pus is coming from a wound.  You develop a fever or as directed by your health care provider.  You notice a bad smell coming from an ulcer or wound. Document Released: 10/13/2000 Document Revised: 06/18/2013 Document Reviewed: 03/25/2013 Exit[employer] Patient Information 2014 [employer].  

## 2014-01-01 NOTE — Progress Notes (Signed)
  HPI: Patient presents today for follow up of diabetic foot and nail care. Past medical history, meds, and allergies reviewed. Patient states blood sugar is under good  control.   Objective:   Objective:  Patients chart is reviewed.  Vascular status reveals pedal pulses noted at  2 out of 4 dp and pt bilateral .  Neurological sensation is Decreased to [facility] monofilament bilateral at 3/5 sites.   Toenails are elongated, incurvated, discolored, dystrophic with ingrown deformity present.    Assessment: Diabetes with Neuropathy , Ingrown nail deformity, history of gout  Plan: Discussed treatment options and alternatives. Debrided nails without complication.   Return appointment recommended at routine intervals of 3 months.   Marlowe AschoffKathryn Claudetta Sallie, DPM

## 2014-01-05 ENCOUNTER — Ambulatory Visit (INDEPENDENT_AMBULATORY_CARE_PROVIDER_SITE_OTHER)
Admission: RE | Admit: 2014-01-05 | Discharge: 2014-01-05 | Disposition: A | Discharge status: Home / Self Care | Payer: Medicare Other | Source: Ambulatory Visit | Attending: Cardiology | Admitting: Cardiology

## 2014-01-05 DIAGNOSIS — I5030 Unspecified diastolic (congestive) heart failure: Secondary | ICD-10-CM

## 2014-01-05 DIAGNOSIS — I509 Heart failure, unspecified: Secondary | ICD-10-CM

## 2014-01-11 ENCOUNTER — Inpatient Hospital Stay (HOSPITAL_COMMUNITY)
Admission: EM | Admit: 2014-01-11 | Discharge: 2014-01-16 | DRG: 291 | Disposition: A | Discharge status: Other Acute Inpatient Hospital | Payer: Medicare Other | Attending: Internal Medicine | Admitting: Internal Medicine

## 2014-01-11 ENCOUNTER — Emergency Department (HOSPITAL_COMMUNITY): Payer: Medicare Other

## 2014-01-11 ENCOUNTER — Encounter (HOSPITAL_COMMUNITY): Payer: Self-pay | Admitting: Emergency Medicine

## 2014-01-11 DIAGNOSIS — Q211 Atrial septal defect, unspecified: Secondary | ICD-10-CM

## 2014-01-11 DIAGNOSIS — N390 Urinary tract infection, site not specified: Secondary | ICD-10-CM | POA: Diagnosis present

## 2014-01-11 DIAGNOSIS — I5043 Acute on chronic combined systolic (congestive) and diastolic (congestive) heart failure: Secondary | ICD-10-CM | POA: Diagnosis present

## 2014-01-11 DIAGNOSIS — I5033 Acute on chronic diastolic (congestive) heart failure: Principal | ICD-10-CM | POA: Diagnosis present

## 2014-01-11 DIAGNOSIS — Z96659 Presence of unspecified artificial knee joint: Secondary | ICD-10-CM

## 2014-01-11 DIAGNOSIS — J45909 Unspecified asthma, uncomplicated: Secondary | ICD-10-CM

## 2014-01-11 DIAGNOSIS — R269 Unspecified abnormalities of gait and mobility: Secondary | ICD-10-CM

## 2014-01-11 DIAGNOSIS — R5381 Other malaise: Secondary | ICD-10-CM

## 2014-01-11 DIAGNOSIS — I10 Essential (primary) hypertension: Secondary | ICD-10-CM | POA: Diagnosis present

## 2014-01-11 DIAGNOSIS — N183 Chronic kidney disease, stage 3 unspecified: Secondary | ICD-10-CM | POA: Diagnosis present

## 2014-01-11 DIAGNOSIS — I5021 Acute systolic (congestive) heart failure: Secondary | ICD-10-CM

## 2014-01-11 DIAGNOSIS — I5030 Unspecified diastolic (congestive) heart failure: Secondary | ICD-10-CM

## 2014-01-11 DIAGNOSIS — I350 Nonrheumatic aortic (valve) stenosis: Secondary | ICD-10-CM

## 2014-01-11 DIAGNOSIS — I739 Peripheral vascular disease, unspecified: Secondary | ICD-10-CM | POA: Diagnosis present

## 2014-01-11 DIAGNOSIS — E1142 Type 2 diabetes mellitus with diabetic polyneuropathy: Secondary | ICD-10-CM | POA: Diagnosis present

## 2014-01-11 DIAGNOSIS — I509 Heart failure, unspecified: Secondary | ICD-10-CM

## 2014-01-11 DIAGNOSIS — I359 Nonrheumatic aortic valve disorder, unspecified: Secondary | ICD-10-CM

## 2014-01-11 DIAGNOSIS — G4733 Obstructive sleep apnea (adult) (pediatric): Secondary | ICD-10-CM

## 2014-01-11 DIAGNOSIS — E1149 Type 2 diabetes mellitus with other diabetic neurological complication: Secondary | ICD-10-CM

## 2014-01-11 DIAGNOSIS — M109 Gout, unspecified: Secondary | ICD-10-CM

## 2014-01-11 DIAGNOSIS — G4730 Sleep apnea, unspecified: Secondary | ICD-10-CM | POA: Diagnosis present

## 2014-01-11 DIAGNOSIS — K219 Gastro-esophageal reflux disease without esophagitis: Secondary | ICD-10-CM | POA: Diagnosis present

## 2014-01-11 DIAGNOSIS — I5032 Chronic diastolic (congestive) heart failure: Secondary | ICD-10-CM | POA: Diagnosis present

## 2014-01-11 DIAGNOSIS — Z79899 Other long term (current) drug therapy: Secondary | ICD-10-CM

## 2014-01-11 DIAGNOSIS — I482 Chronic atrial fibrillation, unspecified: Secondary | ICD-10-CM

## 2014-01-11 DIAGNOSIS — E039 Hypothyroidism, unspecified: Secondary | ICD-10-CM

## 2014-01-11 DIAGNOSIS — J9600 Acute respiratory failure, unspecified whether with hypoxia or hypercapnia: Secondary | ICD-10-CM | POA: Diagnosis present

## 2014-01-11 DIAGNOSIS — I340 Nonrheumatic mitral (valve) insufficiency: Secondary | ICD-10-CM

## 2014-01-11 DIAGNOSIS — R942 Abnormal results of pulmonary function studies: Secondary | ICD-10-CM

## 2014-01-11 DIAGNOSIS — E7800 Pure hypercholesterolemia, unspecified: Secondary | ICD-10-CM | POA: Diagnosis present

## 2014-01-11 DIAGNOSIS — Z8673 Personal history of transient ischemic attack (TIA), and cerebral infarction without residual deficits: Secondary | ICD-10-CM

## 2014-01-11 DIAGNOSIS — E785 Hyperlipidemia, unspecified: Secondary | ICD-10-CM | POA: Diagnosis present

## 2014-01-11 DIAGNOSIS — E46 Unspecified protein-calorie malnutrition: Secondary | ICD-10-CM | POA: Diagnosis present

## 2014-01-11 DIAGNOSIS — I4891 Unspecified atrial fibrillation: Secondary | ICD-10-CM | POA: Diagnosis present

## 2014-01-11 DIAGNOSIS — E871 Hypo-osmolality and hyponatremia: Secondary | ICD-10-CM

## 2014-01-11 DIAGNOSIS — I080 Rheumatic disorders of both mitral and aortic valves: Secondary | ICD-10-CM | POA: Diagnosis present

## 2014-01-11 DIAGNOSIS — K5900 Constipation, unspecified: Secondary | ICD-10-CM | POA: Diagnosis present

## 2014-01-11 DIAGNOSIS — N179 Acute kidney failure, unspecified: Secondary | ICD-10-CM | POA: Diagnosis present

## 2014-01-11 DIAGNOSIS — I129 Hypertensive chronic kidney disease with stage 1 through stage 4 chronic kidney disease, or unspecified chronic kidney disease: Secondary | ICD-10-CM | POA: Diagnosis present

## 2014-01-11 LAB — CBC WITH DIFFERENTIAL/PLATELET
BASOS ABS: 0 10*3/uL (ref 0.0–0.1)
BASOS PCT: 0 % (ref 0–1)
Eosinophils Absolute: 0 10*3/uL (ref 0.0–0.7)
Eosinophils Relative: 0 % (ref 0–5)
HEMATOCRIT: 37.5 % (ref 36.0–46.0)
Hemoglobin: 12.8 g/dL (ref 12.0–15.0)
LYMPHS PCT: 11 % — AB (ref 12–46)
Lymphs Abs: 1.2 10*3/uL (ref 0.7–4.0)
MCH: 33.2 pg (ref 26.0–34.0)
MCHC: 34.1 g/dL (ref 30.0–36.0)
MCV: 97.4 fL (ref 78.0–100.0)
MONO ABS: 1 10*3/uL (ref 0.1–1.0)
Monocytes Relative: 8 % (ref 3–12)
NEUTROS ABS: 9 10*3/uL — AB (ref 1.7–7.7)
NEUTROS PCT: 80 % — AB (ref 43–77)
PLATELETS: 173 10*3/uL (ref 150–400)
RBC: 3.85 MIL/uL — ABNORMAL LOW (ref 3.87–5.11)
RDW: 15.4 % (ref 11.5–15.5)
WBC: 11.3 10*3/uL — AB (ref 4.0–10.5)

## 2014-01-11 LAB — BASIC METABOLIC PANEL
BUN: 29 mg/dL — ABNORMAL HIGH (ref 6–23)
CHLORIDE: 85 meq/L — AB (ref 96–112)
CO2: 23 mEq/L (ref 19–32)
CREATININE: 1.13 mg/dL — AB (ref 0.50–1.10)
Calcium: 9 mg/dL (ref 8.4–10.5)
GFR calc non Af Amer: 42 mL/min — ABNORMAL LOW (ref >90)
GFR, EST AFRICAN AMERICAN: 49 mL/min — AB (ref >90)
Glucose, Bld: 193 mg/dL — ABNORMAL HIGH (ref 70–99)
Potassium: 5.1 mEq/L (ref 3.7–5.3)
SODIUM: 121 meq/L — AB (ref 137–147)

## 2014-01-11 LAB — TROPONIN I

## 2014-01-11 LAB — PRO B NATRIURETIC PEPTIDE: PRO B NATRI PEPTIDE: 2822 pg/mL — AB (ref 0–450)

## 2014-01-11 NOTE — ED Notes (Signed)
X-ray at bedside

## 2014-01-11 NOTE — H&P (Signed)
[facility] History and Physical  [name]:[id number] DOB: 03-06-1924 DOA: 01/11/2014  Referring physician: ED physician PCP: Ginette Otto, MD   Chief Complaint: shortness of breath   HPI:  Pt is 80 yo female with diastolic CHF, HTN, HLD, DM, MR and AS who presented to [facility] ED with main concern of several days duration of progressively worsening shortness of breath, present with exertion and at rest, associated with progressive LE swelling, 3 pillow orthopnea, 10 lbs weight gain. She explains she was told to stop taking Lasix several days ago by PCP due to worsening blood work but she is not sure of the details. She denies chest pain, no specific abdominal or urinary concerns, no fevers, chills.   In ED, pt is noted to be volume overloaded but hemodynamically stable, CXR with pulmonary vascular congestion. [facility] asked to admit for further evaluation.   Assessment and Plan: Active Problems: Acute hypoxic respiratory failure - secondary to CHF secondary to not taking medication - will admit to telemetry unit - place on Lasix 40 mg IV BID and monitor clinical response - weight on admission is 158 lbs - strict I's and O's - check BNP and repeat 2 D ECHO  Acute renal failure - place on Lasix and repeat BMP again in AM HTN - stable on admission - continue home medical regimen metoprolol and Lasix  AS and MR - follows with Dr. Armanda Magic - continue Pradaxa Hypothyroidism - continue synthroid  Diabetes with neuropathy - place on SSI for now and check A1C - also continue neurontin per home medical regimen   Radiological Exams on Admission: Dg Chest Port 1 View  01/11/2014   CLINICAL DATA:  Shortness of Breath  EXAM: PORTABLE CHEST - 1 VIEW  COMPARISON:  January 05, 2014  FINDINGS: There is no edema or consolidation. Heart is enlarged with mild pulmonary venous hypertension. No adenopathy. No bone lesions.  IMPRESSION: Evidence of volume overload with cardiomegaly  and pulmonary venous hypertension. No overt edema or consolidation, however. No appreciable effusions.   Electronically Signed   By: Bretta Bang M.D.   On: 01/11/2014 21:58   Code Status: Full Family Communication: Pt at bedside Disposition Plan: Admit for further evaluation     Review of Systems:  Constitutional: Negative for fever, chills and malaise/fatigue. Negative for diaphoresis.  HENT: Negative for hearing loss, ear pain, nosebleeds, congestion, sore throat, neck pain, tinnitus and ear discharge.   Eyes: Negative for blurred vision, double vision, photophobia, pain, discharge and redness.  Respiratory: Negative for wheezing and stridor.   Cardiovascular: Negative for chest pain, palpitations, orthopnea, claudication.  Gastrointestinal: Negative for nausea, vomiting and abdominal pain. Negative for heartburn, constipation, blood in stool and melena.  Genitourinary: Negative for dysuria, urgency, frequency, hematuria and flank pain.  Musculoskeletal: Negative for myalgias, back pain, joint pain and falls.  Skin: Negative for itching and rash.  Neurological: Negative for tingling, tremors, sensory change, speech change, focal weakness, loss of consciousness and headaches.  Endo/Heme/Allergies: Negative for environmental allergies and polydipsia. Does not bruise/bleed easily.  Psychiatric/Behavioral: Negative for suicidal ideas. The patient is not nervous/anxious.      Past Medical History  Diagnosis Date  . Hypertension   . Peripheral neuropathy   . Asthma   . High cholesterol   . Diabetes with neurologic complications   . PAD (peripheral artery disease)   . Enlarged heart   . Anginal pain 05/28/12  . Pneumonia 11/2010  . Sleep apnea  cpap  . Hypothyroidism   . Headache(784.0) 05/29/12    "recently"  . Stroke 05/2005; 08/2010    !mini"  . Personal history of gout   . Hypothyroidism   . DJD (degenerative joint disease)   . Carpal tunnel syndrome, bilateral   .  Recurrent labyrinthitis   . GERD (gastroesophageal reflux disease)     S/P esophageal dilation in 1996  . Gallstone     Silent  . Fibrocystic breast changes   . Hemorrhoids     with bleeding  . Hx of adenomatous colonic polyps     Dr Sherin Quarry  . Diverticulitis     left side  . MR (mitral regurgitation)     moderate  . Mild anemia     hemoglobin 11.8 on 06/2009  . Edema of lower extremity     Chronic  . Ischemic colitis 8/13  . Aortic stenosis, moderate   . Chronic diastolic CHF (congestive heart failure)   . Moderate mitral regurgitation   . CKD (chronic kidney disease) stage 3, GFR 30-59 ml/min     "something on labs always say she has some problems"  . Chronic atrial fibrillation     Past Surgical History  Procedure Laterality Date  . Tonsillectomy and adenoidectomy  1960's  . Dilation and curettage of uterus    . Vaginal hysterectomy  1970's  . Total knee arthroplasty  1970-~2002    left; right  . Cataract extraction w/ intraocular lens  implant, bilateral  ?1970's    Social History:  reports that she has never smoked. She has never used smokeless tobacco. She reports that she does not drink alcohol or use illicit drugs.  Allergies  Allergen Reactions  . Aspirin Other (See Comments)    Tears my stomach up; "can take coated Aspirin qd without problems"  . Nexium [Esomeprazole Magnesium] Rash  . Penicillins Rash    Tolerates Rocephin    Family History  Problem Relation Age of Onset  . Heart disease Mother   . Heart disease Father   . Heart disease Brother     Prior to Admission medications   Medication Sig Start Date End Date Taking? Authorizing Provider  acetaminophen (TYLENOL) 500 MG tablet Take 500 mg by mouth every 6 (six) hours as needed for pain.   Yes Historical Provider, MD  allopurinol (ZYLOPRIM) 300 MG tablet Take 300 mg by mouth daily.   Yes Historical Provider, MD  amiodarone (PACERONE) 200 MG tablet Take 1 tablet (200 mg total) by mouth daily.  06/20/12 01/11/14 Yes Evlyn Kanner Love, PA-C  atorvastatin (LIPITOR) 10 MG tablet Take 10 mg by mouth daily.   Yes Historical Provider, MD  Calcium Carbonate-Vitamin D (CALCIUM-VITAMIN D) 600-200 MG-UNIT CAPS Take 1 tablet by mouth daily.    Yes Historical Provider, MD  ciprofloxacin (CIPRO) 250 MG tablet Take 250 mg by mouth 2 (two) times daily.   Yes Historical Provider, MD  dabigatran (PRADAXA) 75 MG CAPS Take 75 mg by mouth every 12 (twelve) hours.   Yes Historical Provider, MD  gabapentin (NEURONTIN) 300 MG capsule Take 300 mg by mouth at bedtime.   Yes Historical Provider, MD  Garlic (GARLIQUE) 400 MG TBEC Take 400 mg by mouth daily.    Yes Historical Provider, MD  Ketotifen Fumarate (ZADITOR OP) Place 1 drop into both eyes daily as needed (for dry eyes).   Yes Historical Provider, MD  L-methylfolate Calcium 7.5 MG TABS Take 1 tablet by mouth 2 (two) times daily.  Yes Historical Provider, MD  Lancets (FREESTYLE) lancets  10/27/13  Yes Historical Provider, MD  levothyroxine (SYNTHROID, LEVOTHROID) 88 MCG tablet Take 88 mcg by mouth daily.   Yes Historical Provider, MD  metoprolol succinate (TOPROL-XL) 25 MG 24 hr tablet Take 0.5 tablets (12.5 mg total) by mouth daily. 06/20/12  Yes Evlyn KannerPamela S Love, PA-C  Multiple Vitamins-Minerals (MULTIVITAMIN WITH MINERALS) tablet Take 1 tablet by mouth daily.   Yes Historical Provider, MD  Omega-3 Fatty Acids (FISH OIL) 1000 MG CAPS Take 1,000 mg by mouth daily.    Yes Historical Provider, MD  ONE TOUCH ULTRA TEST test strip  11/11/13  Yes Historical Provider, MD  oxybutynin (DITROPAN) 5 MG tablet Take 10 mg by mouth at bedtime.  08/26/13  Yes Historical Provider, MD  polycarbophil (FIBERCON) 625 MG tablet Take 625 mg by mouth 2 (two) times daily.   Yes Historical Provider, MD  potassium chloride (K-DUR,KLOR-CON) 10 MEQ tablet Take 10 mEq by mouth 2 (two) times daily.   Yes Historical Provider, MD  Probiotic Product (PROBIOTIC DAILY PO) Take 1 tablet by mouth  daily.    Yes Historical Provider, MD  vitamin C (ASCORBIC ACID) 500 MG tablet Take 500 mg by mouth daily.   Yes Historical Provider, MD  zaleplon (SONATA) 5 MG capsule Take 10 mg by mouth at bedtime.    Yes Historical Provider, MD    Physical Exam: Filed Vitals:   01/11/14 2139 01/11/14 2215 01/11/14 2230 01/11/14 2330  BP: 139/64 127/70 130/63 135/82  Pulse: 72 73 75 72  Temp: 97.8 F (36.6 C)     Resp: 20 23 21 19   Weight: 71.668 kg (158 lb)     SpO2: 96% 94% 93% 92%    Physical Exam  Constitutional: Appears well-developed and well-nourished. No distress.  HENT: Normocephalic. External right and left ear normal. Oropharynx is clear and moist.  Eyes: Conjunctivae and EOM are normal. PERRLA, no scleral icterus.  Neck: Normal ROM. Neck supple. No tracheal deviation. No thyromegaly.  CVS: RRR, S1/S2 +, AEM 3/6, no gallops, no carotid bruit.  Pulmonary: Effort and breath sounds normal, bibasilar crackles  Abdominal: Soft. BS +,  no distension, tenderness, rebound or guarding.  Musculoskeletal: Normal range of motion. + 2 bilateral LE pitting edema and no tenderness.  Lymphadenopathy: No lymphadenopathy noted, cervical, inguinal. Neuro: Alert. Normal reflexes, muscle tone coordination. No cranial nerve deficit. Skin: Skin is warm and dry. No rash noted. Not diaphoretic. No erythema. No pallor.  Psychiatric: Normal mood and affect. Behavior, judgment, thought content normal.   Labs on Admission:  Basic Metabolic Panel:  Recent Labs Lab 01/11/14 2206  NA 121*  K 5.1  CL 85*  CO2 23  GLUCOSE 193*  BUN 29*  CREATININE 1.13*  CALCIUM 9.0   CBC:  Recent Labs Lab 01/11/14 2206  WBC 11.3*  NEUTROABS 9.0*  HGB 12.8  HCT 37.5  MCV 97.4  PLT 173   Cardiac Enzymes:  Recent Labs Lab 01/11/14 2206  TROPONINI <0.30   EKG: Normal sinus rhythm, no ST/T wave changes  Debbora PrestoMAGICK-Norlan Rann, MD  [facility] Pager [phone]  If 7PM-7AM, please contact  night-coverage [email or web address] Password [email or web address] 01/11/2014, 11:49 PM

## 2014-01-11 NOTE — ED Notes (Signed)
Pt arrives from home via EMS for evaluation of sob and increased bilateral leg swelling. Pt has hx of CHF and normally is on lasix, EMS reports that pt recently d/c of lasix by doctor. Pt states increased sob and swelling in the past week, EMS also states that pt has had 10 lb weight gain in the past two days. EKG done-EMS reports RBB. Pt alert and oriented, taking multiple breaths mid sentence. Skin warm and dry, nad noted.

## 2014-01-11 NOTE — ED Provider Notes (Signed)
CSN: [id number]     Arrival date & time 01/11/14  2136 History   First MD Initiated Contact with Patient 01/11/14 2139     Chief Complaint  Patient presents with  . Shortness of Breath     (Consider location/radiation/quality/duration/timing/severity/associated sxs/prior Treatment) Patient is a 80 y.o. female presenting with shortness of breath.  Shortness of Breath  Pt with h/o HTN, diastolic HF and known MR and AS followed by Dr. Mayford Knife reports she was taken off her Lasix a few days ago, unsure why but maybe something to do with her kidneys. Since then she has had increasing SOB, DOE and LE edema with a 10lb weight gain. Daughter called EMS to bring her to the ED for eval. Pt denies any cough, fever or CP.  Past Medical History  Diagnosis Date  . Hypertension   . Peripheral neuropathy   . Asthma   . High cholesterol   . Diabetes with neurologic complications   . PAD (peripheral artery disease)   . Enlarged heart   . Anginal pain 05/28/12  . Pneumonia 11/2010  . Sleep apnea     cpap  . Hypothyroidism   . Headache(784.0) 05/29/12    "recently"  . Stroke 05/2005; 08/2010    !mini"  . Personal history of gout   . Hypothyroidism   . DJD (degenerative joint disease)   . Carpal tunnel syndrome, bilateral   . Recurrent labyrinthitis   . GERD (gastroesophageal reflux disease)     S/P esophageal dilation in 1996  . Gallstone     Silent  . Fibrocystic breast changes   . Hemorrhoids     with bleeding  . Hx of adenomatous colonic polyps     Dr Sherin Quarry  . Diverticulitis     left side  . MR (mitral regurgitation)     moderate  . Mild anemia     hemoglobin 11.8 on 06/2009  . Edema of lower extremity     Chronic  . Ischemic colitis 8/13  . Aortic stenosis, moderate   . Chronic diastolic CHF (congestive heart failure)   . Moderate mitral regurgitation   . CKD (chronic kidney disease) stage 3, GFR 30-59 ml/min     "something on labs always say she has some problems"  . Chronic  atrial fibrillation    Past Surgical History  Procedure Laterality Date  . Tonsillectomy and adenoidectomy  1960's  . Dilation and curettage of uterus    . Vaginal hysterectomy  1970's  . Total knee arthroplasty  1970-~2002    left; right  . Cataract extraction w/ intraocular lens  implant, bilateral  ?1970's   Family History  Problem Relation Age of Onset  . Heart disease Mother   . Heart disease Father   . Heart disease Brother    History  Substance Use Topics  . Smoking status: Never Smoker   . Smokeless tobacco: Never Used  . Alcohol Use: No   OB History   Grav Para Term Preterm Abortions TAB SAB Ect Mult Living                 Review of Systems  Respiratory: Positive for shortness of breath.     All other systems reviewed and are negative except as noted in HPI.    Allergies  Aspirin; Nexium; and Penicillins  Home Medications   Current Outpatient Rx  Name  Route  Sig  Dispense  Refill  . acetaminophen (TYLENOL) 500 MG tablet   Oral  Take 500 mg by mouth every 6 (six) hours as needed for pain.         Marland Kitchen. allopurinol (ZYLOPRIM) 300 MG tablet   Oral   Take 300 mg by mouth daily.         Marland Kitchen. EXPIRED: amiodarone (PACERONE) 200 MG tablet   Oral   Take 1 tablet (200 mg total) by mouth daily.   30 tablet   1   . atorvastatin (LIPITOR) 10 MG tablet   Oral   Take 10 mg by mouth daily.         . Calcium Carbonate-Vitamin D (CALCIUM-VITAMIN D) 600-200 MG-UNIT CAPS   Oral   Take 1 tablet by mouth daily.          . dabigatran (PRADAXA) 75 MG CAPS   Oral   Take 75 mg by mouth every 12 (twelve) hours.         . furosemide (LASIX) 80 MG tablet      TAKE 1/2 TABLET BY MOUTH EVERY DAY   15 tablet   6   . gabapentin (NEURONTIN) 300 MG capsule   Oral   Take 300 mg by mouth at bedtime.         . Garlic (GARLIQUE) 400 MG TBEC   Oral   Take 400 mg by mouth daily.          Marland Kitchen. Ketotifen Fumarate (ZADITOR OP)   Both Eyes   Place 1 drop into  both eyes daily as needed (for dry eyes).         . L-methylfolate Calcium 7.5 MG TABS   Oral   Take 1 tablet by mouth 2 (two) times daily.         . Lancets (FREESTYLE) lancets               . levothyroxine (SYNTHROID, LEVOTHROID) 88 MCG tablet   Oral   Take 88 mcg by mouth daily.         . metoprolol succinate (TOPROL-XL) 25 MG 24 hr tablet   Oral   Take 0.5 tablets (12.5 mg total) by mouth daily.   15 tablet   1   . Multiple Vitamins-Minerals (MULTIVITAMIN WITH MINERALS) tablet   Oral   Take 1 tablet by mouth daily.         . Omega-3 Fatty Acids (FISH OIL) 1000 MG CAPS   Oral   Take 300 mg by mouth daily.          . ONE TOUCH ULTRA TEST test strip               . oxybutynin (DITROPAN) 5 MG tablet   Oral   Take 10 mg by mouth.          . polycarbophil (FIBERCON) 625 MG tablet   Oral   Take 625 mg by mouth 2 (two) times daily.         . potassium chloride (K-DUR,KLOR-CON) 10 MEQ tablet   Oral   Take 10 mEq by mouth 2 (two) times daily.         . Probiotic Product (PROBIOTIC DAILY PO)   Oral   Take 1 tablet by mouth daily.          . vitamin C (ASCORBIC ACID) 500 MG tablet   Oral   Take 500 mg by mouth daily.         . zaleplon (SONATA) 5 MG capsule   Oral   Take 10 mg by mouth  at bedtime.           BP 139/64  Pulse 72  Temp(Src) 97.8 F (36.6 C)  Resp 20  Wt 158 lb (71.668 kg)  SpO2 96% Physical Exam  Nursing note and vitals reviewed. Constitutional: She is oriented to person, place, and time. She appears well-developed and well-nourished.  HENT:  Head: Normocephalic and atraumatic.  Eyes: EOM are normal. Pupils are equal, round, and reactive to light.  Neck: Normal range of motion. Neck supple.  Cardiovascular: Normal rate, normal heart sounds and intact distal pulses.   Pulmonary/Chest: Effort normal and breath sounds normal. She has no wheezes. She has no rales.  Speaks in broken sentences  Abdominal: Bowel sounds  are normal. She exhibits no distension. There is no tenderness.  Musculoskeletal: Normal range of motion. She exhibits edema (2+ edema BLE). She exhibits no tenderness.  Neurological: She is alert and oriented to person, place, and time. She has normal strength. No cranial nerve deficit or sensory deficit.  Skin: Skin is warm and dry. No rash noted.  Psychiatric: She has a normal mood and affect.    ED Course  Procedures (including critical care time) Labs Review Labs Reviewed  CBC WITH DIFFERENTIAL - Abnormal; Notable for the following:    WBC 11.3 (*)    RBC 3.85 (*)    Neutrophils Relative % 80 (*)    Neutro Abs 9.0 (*)    Lymphocytes Relative 11 (*)    All other components within normal limits  BASIC METABOLIC PANEL - Abnormal; Notable for the following:    Sodium 121 (*)    Chloride 85 (*)    Glucose, Bld 193 (*)    BUN 29 (*)    Creatinine, Ser 1.13 (*)    GFR calc non Af Amer 42 (*)    GFR calc Af Amer 49 (*)    All other components within normal limits  PRO B NATRIURETIC PEPTIDE - Abnormal; Notable for the following:    Pro B Natriuretic peptide (BNP) 2822.0 (*)    All other components within normal limits  TROPONIN I   Imaging Review Dg Chest Port 1 View  01/11/2014   CLINICAL DATA:  Shortness of Breath  EXAM: PORTABLE CHEST - 1 VIEW  COMPARISON:  January 05, 2014  FINDINGS: There is no edema or consolidation. Heart is enlarged with mild pulmonary venous hypertension. No adenopathy. No bone lesions.  IMPRESSION: Evidence of volume overload with cardiomegaly and pulmonary venous hypertension. No overt edema or consolidation, however. No appreciable effusions.   Electronically Signed   By: Bretta Bang M.D.   On: 01/11/2014 21:58     EKG Interpretation   Date/Time:  Sunday January 11 2014 21:55:29 EDT Ventricular Rate:  71 PR Interval:  207 QRS Duration: 181 QT Interval:  491 QTC Calculation: 534 R Axis:   115 Text Interpretation:  Sinus rhythm Left atrial  enlargement RBBB and LPFB  No significant change since last tracing Confirmed by SHELDON  MD, Leonette Most  [phone]) on 01/11/2014 10:10:41 PM      MDM   Final diagnoses:  CHF (congestive heart failure)  Hyponatremia    Pt with evidence of fluid overload. She also has hyponatremia of unclear etiology. Discussed with Hospitalist who will admit and will initiate diuretics in her orders.     Charles B. Bernette Mayers, MD 01/11/14 [phone]

## 2014-01-12 DIAGNOSIS — I4891 Unspecified atrial fibrillation: Secondary | ICD-10-CM

## 2014-01-12 DIAGNOSIS — E871 Hypo-osmolality and hyponatremia: Secondary | ICD-10-CM

## 2014-01-12 DIAGNOSIS — G4733 Obstructive sleep apnea (adult) (pediatric): Secondary | ICD-10-CM

## 2014-01-12 DIAGNOSIS — I369 Nonrheumatic tricuspid valve disorder, unspecified: Secondary | ICD-10-CM

## 2014-01-12 DIAGNOSIS — E039 Hypothyroidism, unspecified: Secondary | ICD-10-CM

## 2014-01-12 LAB — URINALYSIS, ROUTINE W REFLEX MICROSCOPIC
BILIRUBIN URINE: NEGATIVE
GLUCOSE, UA: NEGATIVE mg/dL
KETONES UR: NEGATIVE mg/dL
Nitrite: NEGATIVE
Protein, ur: 30 mg/dL — AB
Specific Gravity, Urine: 1.016 (ref 1.005–1.030)
Urobilinogen, UA: 0.2 mg/dL (ref 0.0–1.0)
pH: 6 (ref 5.0–8.0)

## 2014-01-12 LAB — BASIC METABOLIC PANEL
BUN: 27 mg/dL — AB (ref 6–23)
CALCIUM: 8.8 mg/dL (ref 8.4–10.5)
CO2: 25 meq/L (ref 19–32)
CREATININE: 1.05 mg/dL (ref 0.50–1.10)
Chloride: 88 mEq/L — ABNORMAL LOW (ref 96–112)
GFR calc Af Amer: 54 mL/min — ABNORMAL LOW (ref >90)
GFR calc non Af Amer: 46 mL/min — ABNORMAL LOW (ref >90)
Glucose, Bld: 123 mg/dL — ABNORMAL HIGH (ref 70–99)
Potassium: 4.9 mEq/L (ref 3.7–5.3)
Sodium: 125 mEq/L — ABNORMAL LOW (ref 137–147)

## 2014-01-12 LAB — CBC
HCT: 33 % — ABNORMAL LOW (ref 36.0–46.0)
Hemoglobin: 11.5 g/dL — ABNORMAL LOW (ref 12.0–15.0)
MCH: 33.4 pg (ref 26.0–34.0)
MCHC: 34.8 g/dL (ref 30.0–36.0)
MCV: 95.9 fL (ref 78.0–100.0)
PLATELETS: 168 10*3/uL (ref 150–400)
RBC: 3.44 MIL/uL — ABNORMAL LOW (ref 3.87–5.11)
RDW: 15.2 % (ref 11.5–15.5)
WBC: 11 10*3/uL — ABNORMAL HIGH (ref 4.0–10.5)

## 2014-01-12 LAB — URINE MICROSCOPIC-ADD ON

## 2014-01-12 LAB — GLUCOSE, CAPILLARY
Glucose-Capillary: 132 mg/dL — ABNORMAL HIGH (ref 70–99)
Glucose-Capillary: 184 mg/dL — ABNORMAL HIGH (ref 70–99)

## 2014-01-12 LAB — PRO B NATRIURETIC PEPTIDE: Pro B Natriuretic peptide (BNP): 3584 pg/mL — ABNORMAL HIGH (ref 0–450)

## 2014-01-12 LAB — TSH: TSH: 1.524 u[IU]/mL (ref 0.350–4.500)

## 2014-01-12 LAB — HEMOGLOBIN A1C
Hgb A1c MFr Bld: 6.2 % — ABNORMAL HIGH (ref <5.7)
Mean Plasma Glucose: 131 mg/dL — ABNORMAL HIGH (ref <117)

## 2014-01-12 MED ORDER — SODIUM CHLORIDE 0.9 % IJ SOLN
3.0000 mL | Freq: Two times a day (BID) | INTRAMUSCULAR | Status: DC
  Administered 2014-01-12 – 2014-01-15 (×3): 3 mL via INTRAVENOUS

## 2014-01-12 MED ORDER — POTASSIUM CHLORIDE CRYS ER 10 MEQ PO TBCR
10.0000 meq | EXTENDED_RELEASE_TABLET | Freq: Two times a day (BID) | ORAL | Status: DC
  Administered 2014-01-12 – 2014-01-13 (×4): 10 meq via ORAL
  Filled 2014-01-12 (×7): qty 1

## 2014-01-12 MED ORDER — ONDANSETRON HCL 4 MG/2ML IJ SOLN: 4.0000 mg | Freq: Four times a day (QID) | INTRAMUSCULAR | Status: DC | PRN

## 2014-01-12 MED ORDER — DABIGATRAN ETEXILATE MESYLATE 75 MG PO CAPS
75.0000 mg | ORAL_CAPSULE | Freq: Two times a day (BID) | ORAL | Status: DC
  Administered 2014-01-12 – 2014-01-16 (×9): 75 mg via ORAL
  Filled 2014-01-12 (×11): qty 1

## 2014-01-12 MED ORDER — FUROSEMIDE 10 MG/ML IJ SOLN
40.0000 mg | Freq: Two times a day (BID) | INTRAMUSCULAR | Status: AC
  Administered 2014-01-12 – 2014-01-13 (×2): 40 mg via INTRAVENOUS
  Filled 2014-01-12: qty 4

## 2014-01-12 MED ORDER — SODIUM CHLORIDE 0.9 % IJ SOLN
3.0000 mL | Freq: Two times a day (BID) | INTRAMUSCULAR | Status: DC
  Administered 2014-01-12 – 2014-01-16 (×6): 3 mL via INTRAVENOUS

## 2014-01-12 MED ORDER — ONDANSETRON HCL 4 MG PO TABS: 4.0000 mg | ORAL_TABLET | Freq: Four times a day (QID) | ORAL | Status: DC | PRN

## 2014-01-12 MED ORDER — SODIUM CHLORIDE 0.9 % IJ SOLN: 3.0000 mL | INTRAMUSCULAR | Status: DC | PRN

## 2014-01-12 MED ORDER — LEVOTHYROXINE SODIUM 88 MCG PO TABS
88.0000 ug | ORAL_TABLET | Freq: Every day | ORAL | Status: DC
  Administered 2014-01-12 – 2014-01-16 (×5): 88 ug via ORAL
  Filled 2014-01-12 (×6): qty 1

## 2014-01-12 MED ORDER — GABAPENTIN 300 MG PO CAPS
300.0000 mg | ORAL_CAPSULE | Freq: Every day | ORAL | Status: DC
  Administered 2014-01-12 – 2014-01-15 (×4): 300 mg via ORAL
  Filled 2014-01-12 (×6): qty 1

## 2014-01-12 MED ORDER — SODIUM CHLORIDE 0.9 % IV SOLN
250.0000 mL | INTRAVENOUS | Status: DC | PRN
  Administered 2014-01-14: 250 mL via INTRAVENOUS

## 2014-01-12 MED ORDER — AMIODARONE HCL 200 MG PO TABS
200.0000 mg | ORAL_TABLET | Freq: Every day | ORAL | Status: DC
  Administered 2014-01-12 – 2014-01-16 (×5): 200 mg via ORAL
  Filled 2014-01-12 (×5): qty 1

## 2014-01-12 MED ORDER — ALLOPURINOL 300 MG PO TABS
300.0000 mg | ORAL_TABLET | Freq: Every day | ORAL | Status: DC
  Administered 2014-01-12 – 2014-01-16 (×5): 300 mg via ORAL
  Filled 2014-01-12 (×5): qty 1

## 2014-01-12 MED ORDER — ACETAMINOPHEN 500 MG PO TABS
500.0000 mg | ORAL_TABLET | Freq: Four times a day (QID) | ORAL | Status: DC | PRN
  Filled 2014-01-12: qty 1

## 2014-01-12 MED ORDER — FUROSEMIDE 10 MG/ML IJ SOLN
20.0000 mg | Freq: Two times a day (BID) | INTRAMUSCULAR | Status: DC
  Administered 2014-01-12: 20 mg via INTRAVENOUS
  Filled 2014-01-12 (×2): qty 2

## 2014-01-12 MED ORDER — OXYBUTYNIN CHLORIDE 5 MG PO TABS
10.0000 mg | ORAL_TABLET | Freq: Every day | ORAL | Status: DC
  Administered 2014-01-12 – 2014-01-15 (×4): 10 mg via ORAL
  Filled 2014-01-12 (×6): qty 2

## 2014-01-12 MED ORDER — CIPROFLOXACIN HCL 250 MG PO TABS
250.0000 mg | ORAL_TABLET | Freq: Two times a day (BID) | ORAL | Status: DC
  Administered 2014-01-12 – 2014-01-13 (×3): 250 mg via ORAL
  Filled 2014-01-12 (×6): qty 1

## 2014-01-12 MED ORDER — ATORVASTATIN CALCIUM 10 MG PO TABS
10.0000 mg | ORAL_TABLET | Freq: Every day | ORAL | Status: DC
  Administered 2014-01-12 – 2014-01-16 (×5): 10 mg via ORAL
  Filled 2014-01-12 (×5): qty 1

## 2014-01-12 MED ORDER — METOPROLOL SUCCINATE 12.5 MG HALF TABLET
12.5000 mg | ORAL_TABLET | Freq: Every day | ORAL | Status: DC
  Administered 2014-01-12 – 2014-01-16 (×5): 12.5 mg via ORAL
  Filled 2014-01-12 (×5): qty 1

## 2014-01-12 MED ORDER — CALCIUM POLYCARBOPHIL 625 MG PO TABS
625.0000 mg | ORAL_TABLET | Freq: Two times a day (BID) | ORAL | Status: DC
  Administered 2014-01-12 – 2014-01-15 (×7): 625 mg via ORAL
  Administered 2014-01-15: 10:00:00 via ORAL
  Administered 2014-01-16: 625 mg via ORAL
  Filled 2014-01-12 (×11): qty 1

## 2014-01-12 MED ORDER — ZOLPIDEM TARTRATE 5 MG PO TABS
5.0000 mg | ORAL_TABLET | Freq: Every evening | ORAL | Status: DC | PRN
  Administered 2014-01-12 – 2014-01-13 (×2): 5 mg via ORAL
  Filled 2014-01-12 (×3): qty 1

## 2014-01-12 NOTE — Progress Notes (Signed)
[facility] PROGRESS NOTE Interim History: 80 yo female with diastolic CHF, HTN, HLD, DM, MR and AS who presented to [facility] ED with main concern of several days duration of progressively worsening shortness of breath, present with exertion and at rest, associated with progressive LE swelling, 3 pillow orthopnea, 10 lbs weight gain. She explains she was told to stop taking Lasix several days ago by PCP due to worsening blood work but she is not sure of the details. She denies chest pain, no specific abdominal or urinary concerns, no fevers, chills.    Filed Weights   01/11/14 2139 01/12/14 0120 01/12/14 0521  Weight: 71.668 kg (158 lb) 78.2 kg (172 lb 6.4 oz) 78.2 kg (172 lb 6.4 oz)        Intake/Output Summary (Last 24 hours) at 01/12/14 1532 Last data filed at 01/12/14 1444  Gross per 24 hour  Intake    480 ml  Output   1010 ml  Net   -530 ml     Assessment/Plan: 1-acute resp failure with hypoxia: due to exacerbation of CHF -daily weight and low sodium diet -strict I's and O's -IV lasix -follow Echo -oxygen supplementation (will wean as tolerated) -continue b-blocker  2-UTI: continue cipro  3-hyponatremia: due to dilution with CHF most likely -will follow BMET  4-acute on chronic renal failure: due to UTI and decrease perfusion -continue lasix and follow renal function closely -continue abx's for UTI  5-hypothyroidism: -continue synthroid  6-atrial fibrillation: rate controlled -continue b-blockers, amiodarone and pradaxa  7-HLD: continue lipitor  8-Physical deconditioning: will ask PT to evaluate   Code Status: Full Family Communication: daughters at bedside Disposition Plan: to be detrmine   Consultants:  None   Procedures: ECHO: pending  Antibiotics:  cipro   HPI/Subjective: Feeling slightly better, but still SOB and requiring O2 oxygen supplementation.  Objective: Filed Vitals:   01/12/14 0030 01/12/14 0120 01/12/14 0521 01/12/14 1319    BP: 130/78 146/90 110/56 121/57  Pulse: 72 73 73 75  Temp:  98.1 F (36.7 C) 98.2 F (36.8 C) 97.9 F (36.6 C)  TempSrc:  Oral Oral Oral  Resp: 23 16 17 19   Height:  5\' 7"  (1.702 m)    Weight:  78.2 kg (172 lb 6.4 oz) 78.2 kg (172 lb 6.4 oz)   SpO2: 98% 98% 98% 99%     Exam:  General: Alert, awake, oriented x3, still SOB and requiring oxygen.  HEENT: No bruits, no goiter.  Heart: rate controlled, positive SEM, no rubs or gallops. 1-2++ edema bilaterally; mild JVD Lungs:fair air movement; positive bibasilar crackles  Abdomen: Soft, nontender, nondistended, positive bowel sounds.  Neuro: Grossly intact, nonfocal.   Data Reviewed: Basic Metabolic Panel:  Recent Labs Lab 01/11/14 2206 01/12/14 0455  NA 121* 125*  K 5.1 4.9  CL 85* 88*  CO2 23 25  GLUCOSE 193* 123*  BUN 29* 27*  CREATININE 1.13* 1.05  CALCIUM 9.0 8.8   CBC:  Recent Labs Lab 01/11/14 2206 01/12/14 0455  WBC 11.3* 11.0*  NEUTROABS 9.0*  --   HGB 12.8 11.5*  HCT 37.5 33.0*  MCV 97.4 95.9  PLT 173 168   Cardiac Enzymes:  Recent Labs Lab 01/11/14 2206  TROPONINI <0.30   BNP (last 3 results)  Recent Labs  04/08/13 1625 01/11/14 2206 01/12/14 0455  PROBNP 2363.0* 2822.0* 3584.0*   CBG:  Recent Labs Lab 01/12/14 0616 01/12/14 1120  GLUCAP 132* 184*    [facility]  01[facility]2014   CLINICAL DATA:  Shortness of Breath  EXAM: PORTABLE CHEST - 1 VIEW  COMPARISON:  January 05, 2014  FINDINGS: There is no edema or consolidation. Heart is enlarged with mild pulmonary venous hypertension. No adenopathy. No bone lesions.  IMPRESSION: Evidence of volume overload with cardiomegaly and pulmonary venous hypertension. No overt edema or consolidation, however. No appreciable effusions.   Electronically Signed   By: Bretta Bang M.D.   On: 01/11/2014 21:58    Scheduled Meds: . allopurinol  300 mg Oral Daily  . amiodarone  200 mg Oral Daily  . atorvastatin  10 mg Oral Daily   . ciprofloxacin  250 mg Oral BID  . dabigatran  75 mg Oral Q12H  . furosemide  40 mg Intravenous BID  . gabapentin  300 mg Oral QHS  . levothyroxine  88 mcg Oral QAC breakfast  . metoprolol succinate  12.5 mg Oral Daily  . oxybutynin  10 mg Oral QHS  . polycarbophil  625 mg Oral BID  . potassium chloride  10 mEq Oral BID  . sodium chloride  3 mL Intravenous Q12H  . sodium chloride  3 mL Intravenous Q12H   Continuous Infusions:   Time> 30 minutes  Vassie Loll  [facility] Pager [phone]. If 8PM-8AM, please contact night-coverage at [email or web address], password [facility] 01/12/2014, 3:32 PM  LOS: 1 day

## 2014-01-12 NOTE — Evaluation (Signed)
Physical Therapy Evaluation Patient Details Name: [name] MRN: [id number] DOB: 1924-01-25 Today's Date: 01/12/2014 Time: [id number] PT Time Calculation (min): 26 min  PT Assessment / Plan / Recommendation History of Present Illness  Pt is 80 yo female with diastolic CHF, HTN, HLD, DM, MR and AS who presented to [facility] ED with main concern of several days duration of progressively worsening shortness of breath, present with exertion and at rest, associated with progressive LE swelling, 3 pillow orthopnea, 10 lbs weight gain.  Clinical Impression  Patient presents with decreased independence with mobility due to deficits listed below (PT problem list).  She will benefit from skilled PT in the acute setting to allow return home with caregiver assist and HHPT.  Depending on progress may need transport chair for home use.    PT Assessment  Patient needs continued PT services    Follow Up Recommendations  Home health PT;Supervision/Assistance - 24 hour    Does the patient have the potential to tolerate intense rehabilitation      Barriers to Discharge  None      Equipment Recommendations  Wheelchair (measurements PT) see above   Recommendations for Other Services  None  Frequency Min 3X/week    Precautions / Restrictions Precautions Precautions: Fall Precaution Comments: Fearful of falling; most recent 2 weeks ago slid off edge of bed   Pertinent Vitals/Pain C/o stiffness/soreness in back      Mobility  Bed Mobility Overal bed mobility: Needs Assistance Bed Mobility: Rolling;Sidelying to Sit Rolling: Min assist Sidelying to sit: Mod assist;HOB elevated General bed mobility comments: encouraged use of rail and patient needed assist to lift trunk and scoot to edge of bed Transfers Overall transfer level: Needs assistance Equipment used: Rolling walker (2 wheeled) Transfers: Sit to/from [employer]GI CorporationStand;Stand Pivot Transfers Sit to Stand: Min assist;Mod assist Stand pivot  transfers: Mod assist General transfer comment: assist for lifting, cues for hand placement (pt habitually pulls up on walker with 4 WW at home; limited hip flexion and posterior bias; to pivot bed to chair/BSC needed max directional cues and assist for safety/balance Ambulation/Gait Ambulation/Gait assistance: Mod assist Ambulation Distance (Feet): 5 Feet Assistive device: Rolling walker (2 wheeled) Gait Pattern/deviations: Step-to pattern;Shuffle;Decreased stride length General Gait Details: 5' fwd and back with encouragement, cues especially for backing up to bed        PT Diagnosis: Generalized weakness;Abnormality of gait  PT Problem List: Decreased strength;Decreased mobility;Decreased safety awareness;Decreased range of motion;Decreased activity tolerance;Decreased balance;Decreased knowledge of use of DME PT Treatment Interventions: DME instruction;Therapeutic exercise;Gait training;Balance training;Functional mobility training;Stair training;Therapeutic activities;Patient/family education     PT Goals(Current goals can be found in the care plan section) Acute Rehab PT Goals Patient Stated Goal: To return home with caregiver assist PT Goal Formulation: With patient/family Time For Goal Achievement: 01/26/14 Potential to Achieve Goals: Fair  Visit Information  Last PT Received On: 01/12/14 Assistance Needed: +1 History of Present Illness: Pt is 80 yo female with diastolic CHF, HTN, HLD, DM, MR and AS who [facility] with main concern of several days duration of progressively worsening shortness of breath, present with exertion and at rest, associated with progressive LE swelling, 3 pillow orthopnea, 10 lbs weight gain.       Prior Functioning  Home Living Family/patient expects to be discharged to:: Private residence Living Arrangements: Alone Available Help at Discharge: Personal care attendant;Available 24 hours/day Type of Home: House Home Access: Stairs to  enter Entergy CorporationEntrance Stairs-Number of Steps: 2 Entrance Stairs-Rails:  Right Home Layout: One level Home Equipment: Walker - 4 wheels;Grab bars - tub/shower;Grab bars - toilet Prior Function Level of Independence: Needs assistance ADL's / Homemaking Assistance Needed: assist for shower Communication Communication: No difficulties Dominant Hand: Right    Cognition  Cognition Arousal/Alertness: Awake/alert Behavior During Therapy: WFL for tasks assessed/performed Overall Cognitive Status: History of cognitive impairments - at baseline    Extremity/Trunk Assessment Lower Extremity Assessment Lower Extremity Assessment: Generalized weakness   Balance Balance Overall balance assessment: History of Falls;Needs assistance Sitting-balance support: Feet unsupported;Bilateral upper extremity supported Sitting balance-Leahy Scale: Poor Sitting balance - Comments: posterior lean at edge of bed without loss of balance Postural control: Posterior lean Standing balance support: During functional activity;Bilateral upper extremity supported Standing balance-Leahy Scale: Poor Standing balance comment: stood for hygiene after toileting with min assist for balance holding walker and slight posterior bias  End of Session PT - End of Session Equipment Utilized During Treatment: Gait belt Activity Tolerance: Patient limited by fatigue Patient left: in chair;with family/visitor present;with call bell/phone within reach  GP     [facility] 01/12/2014, 1:11 PM  [place], [place] [phone] 01/12/2014

## 2014-01-12 NOTE — Progress Notes (Signed)
Echo Lab  2D Echocardiogram completed.  Zilphia Kozinski L Leisa Gault, RDCS 01/12/2014 4:23 PM

## 2014-01-12 NOTE — Progress Notes (Signed)
UR completed Kerrigan Glendening K. Datron Brakebill, RN, BSN, MSHL, CCM  01/12/2014 1:47 PM

## 2014-01-12 NOTE — ED Notes (Signed)
Pt had wet brief, also voided 110 ml on bedpan.

## 2014-01-13 DIAGNOSIS — I10 Essential (primary) hypertension: Secondary | ICD-10-CM

## 2014-01-13 DIAGNOSIS — E1149 Type 2 diabetes mellitus with other diabetic neurological complication: Secondary | ICD-10-CM

## 2014-01-13 DIAGNOSIS — R5381 Other malaise: Secondary | ICD-10-CM

## 2014-01-13 LAB — URINE CULTURE: Colony Count: 70000

## 2014-01-13 LAB — BASIC METABOLIC PANEL
BUN: 28 mg/dL — AB (ref 6–23)
CO2: 26 mEq/L (ref 19–32)
CREATININE: 1.07 mg/dL (ref 0.50–1.10)
Calcium: 8.8 mg/dL (ref 8.4–10.5)
Chloride: 89 mEq/L — ABNORMAL LOW (ref 96–112)
GFR calc Af Amer: 53 mL/min — ABNORMAL LOW (ref >90)
GFR, EST NON AFRICAN AMERICAN: 45 mL/min — AB (ref >90)
GLUCOSE: 100 mg/dL — AB (ref 70–99)
Potassium: 4.4 mEq/L (ref 3.7–5.3)
Sodium: 128 mEq/L — ABNORMAL LOW (ref 137–147)

## 2014-01-13 MED ORDER — FUROSEMIDE 10 MG/ML IJ SOLN
40.0000 mg | Freq: Two times a day (BID) | INTRAMUSCULAR | Status: DC
  Administered 2014-01-13: 40 mg via INTRAVENOUS
  Filled 2014-01-13 (×4): qty 4

## 2014-01-13 MED ORDER — FOSFOMYCIN TROMETHAMINE 3 G PO PACK
3.0000 g | PACK | Freq: Once | ORAL | Status: AC
  Administered 2014-01-13: 3 g via ORAL
  Filled 2014-01-13 (×2): qty 3

## 2014-01-13 NOTE — Care Management Note (Addendum)
  Page 2 of 2   01/13/2014     5:47:43 PM   CARE MANAGEMENT NOTE 01/13/2014  Patient:  [name], [name]   Account Number:  [id number]  Date Initiated:  01/12/2014  Documentation initiated by:  Mariann Laster  Subjective/Objective Assessment:   Admitted with CHF     Action/Plan:   CM to follow for dispositon plans   Anticipated DC Date:  01/12/2014   Anticipated DC Plan:  HOME/SELF CARE         Choice offered to / List presented to:             Status of service:  In process, will continue to follow Medicare Important Message given?   (If response is "NO", the following Medicare IM given date fields will be blank) Date Medicare IM given:   Date Additional Medicare IM given:    Discharge Disposition:    Per UR Regulation:  Reviewed for med. necessity/level of care/duration of stay  If discussed at [place] of Stay Meetings, dates discussed:    Comments:  01/13/2014 CM met with patient and DTR/Kathy Juliann Pulse elected [facility] as HHS provider at d/c RN for CHF mgmt and PT  -  [facility] notified.   (AVS updated) DTR/Kathy [phone] cell DME:  Home CPap,  Scales in the home and weighs daily. HHS:  Active with 24/7 sitter services from [employer]. PT Recs:  CIR;Supervision/Assistance - 24 hour MD has placed order for IP Rehab Pre-Admission Screening Screening complete and IP Rehab Consult recommended. CM will need to cancel HHS order if patient goes to  [place] RN, BSN, MSHL, CCM 01/13/2014  01/12/2014  UTI - Cipro IV Lasix PT RECS:  Pending Jaymi Tinner RN, BSN, [place], CCM 01/12/2014

## 2014-01-13 NOTE — Progress Notes (Signed)
Placed pt. On her home CPAP with her nasal mask from home with 2L O2 bled in. Pt. Is tolerating CPAP well at this time without any complications.

## 2014-01-13 NOTE — Progress Notes (Addendum)
Physical Therapy Treatment Patient Details Name: [name] MRN: [id number] DOB: [birth year] Today's Date: 01/13/2014 Time: 3570-1779 PT Time Calculation (min): 38 min  PT Assessment / Plan / Recommendation  History of Present Illness Pt is 80 yo female with diastolic CHF, HTN, HLD, DM, MR and AS who presented to [facility] ED with main concern of several days duration of progressively worsening shortness of breath, present with exertion and at rest, associated with progressive LE swelling, 3 pillow orthopnea, 10 lbs weight gain.   PT Comments   Pt with continued difficulty with transfers, mobility and fatigue with dgtr present throughout. Dgtr states she was present today and during P.T. eval and feel pt is not doing as well today. Discussed with pt and dgtr caregiver ability who states they are really just sitters and not trained to assist with significant mobility as pt currently needs. Dgtr in agreement at this time that caregivers may not be able to provide accurate physical assist for pt current function given at baseline she was walking throughout the house and getting OOB on her own. Pt had prior stay at [facility] and would like to try rehab there if possible which would be beneficial given pt was walking outside and down the road prior to weather being cold. If  CIR unable then SNF would be necessary prior to return home with caregivers to provide appropriate care and decrease burden of care.   Follow Up Recommendations  CIR;Supervision/Assistance - 24 hour     Does the patient have the potential to tolerate intense rehabilitation     Barriers to Discharge        Equipment Recommendations  Wheelchair (measurements PT)    Recommendations for Other Services    Frequency     Progress towards PT Goals Progress towards PT goals: Not progressing toward goals - comment (slowly)  Plan Discharge plan needs to be updated    Precautions / Restrictions Precautions Precautions: Fall Precaution  Comments: Fearful of falling; most recent 2 weeks ago slid off edge of bed   Pertinent Vitals/Pain sats 94% on RA No pain    Mobility  Bed Mobility Overal bed mobility: Needs Assistance Bed Mobility: Supine to Sit Supine to sit: Min assist General bed mobility comments: min assist with rail to pivot to EOB with cueing for sequence but max assist with pad and reciprocal scooting to scoot out to EOB Transfers Overall transfer level: Needs assistance Equipment used: 4-wheeled walker Transfers: Sit to/from [employer] Sit to Stand: Mod assist Stand pivot transfers: Mod assist General transfer comment: pt with max cueing and assist for anterior weight shift to clear buttock. Pivoted bed >BSC>recliner with increased time and significant posterior bias with pt having difficulty releasing grip on surfaces to pivot hips and transfer to chair due to fear of falling and lack of ability to follow verbal commands requiring dgtr to be present to provide tactile cues to shift hands between surfaces. Pt stood with decreased assist needed from chair when rollator present in front of her. Ambulation/Gait Ambulation/Gait assistance: Mod assist Ambulation Distance (Feet): 5 Feet Assistive device: 4-wheeled walker Gait Pattern/deviations: Shuffle;Trunk flexed;Decreased stride length Gait velocity interpretation: <1.8 ft/sec, indicative of risk for recurrent falls General Gait Details: pt walked 5' forward and initially started with increased steps then after 4' pt unable to advance feet, could not turn or back up and unable to state fatigue with dgtr able to bring chair behind pt to safely sit at surface with max cueing for  hand placement and assist to control descent    Exercises General Exercises - Lower Extremity Long Arc Quad: AROM;Seated;Both;15 reps Hip Flexion/Marching: AROM;Seated;Both;15 reps   PT Diagnosis:    PT Problem List:   PT Treatment Interventions:     PT Goals (current  goals can now be found in the care plan section)    Visit Information  Last PT Received On: 01/13/14 Assistance Needed: +2 (for safety with gait) History of Present Illness: Pt is 80 yo female with diastolic CHF, HTN, HLD, DM, MR and AS who [facility] with main concern of several days duration of progressively worsening shortness of breath, present with exertion and at rest, associated with progressive LE swelling, 3 pillow orthopnea, 10 lbs weight gain.    Subjective Data      Cognition  Cognition Arousal/Alertness: Awake/alert Behavior During Therapy: Flat affect Overall Cognitive Status: Impaired/Different from baseline Area of Impairment: Memory;Following commands Memory: Decreased short-term memory Following Commands: Follows one step commands inconsistently;Follows one step commands with increased time General Comments: pt with baseline cognitive deficits but dgtr present stating pt is having increased difficulty following commands recently    Balance  Balance Overall balance assessment: Needs assistance;History of Falls Sitting-balance support: Bilateral upper extremity supported Sitting balance-Leahy Scale: Poor Sitting balance - Comments: posterior lean at edge of bed without loss of balance Standing balance-Leahy Scale: Poor  End of Session PT - End of Session Equipment Utilized During Treatment: Gait belt Activity Tolerance: Patient limited by fatigue Patient left: in chair;with family/visitor present;with call bell/phone within reach Nurse Communication: Mobility status   GP     [name], [name] 01/13/2014, 2:26 PM Judith MeigsMaija [name] [name], PT [phone]

## 2014-01-13 NOTE — Progress Notes (Signed)
Shift event: earlier in shift, RN paged NP secondary to pt having AV dissociation with widened QRS on tele. This was confirmed with 12 lead EKG. Pt is/was having no symptoms-no chest pain, palpitations, dizziness, n/v, jaw or arm pain and quickly returned to NSR. Given change from previous tele, 12 lead EKG reviewed with Dr. Onalee Huaavid of [facility]. Dr. Onalee Huaavid compared this 12 lead with an older one. Pt has a RBBB. Dr. Onalee Huaavid advises no action at this time, continue tele.  Jimmye NormanKaren Kirby-Graham, NP [facility]

## 2014-01-13 NOTE — Progress Notes (Signed)
Difficulty with ambulation.  Requires two person assist to get up to bedside commode.

## 2014-01-13 NOTE — Plan of Care (Signed)
Problem: Phase II Progression Outcomes Goal: Walk in hall or up in chair TID Outcome: Progressing Up to bedside commode with assistance

## 2014-01-13 NOTE — Progress Notes (Addendum)
Heart Failure Navigator Consult Note  Presentation: Judith BruinsClarice V Talsma -- Pt is 80 yo female with diastolic CHF, HTN, HLD, DM, MR and AS who [facility] with main concern of several days duration of progressively worsening shortness of breath, present with exertion and at rest, associated with progressive LE swelling, 3 pillow orthopnea, 10 lbs weight gain. She explains she was told to stop taking Lasix several days ago by PCP due to worsening blood work but she is not sure of the details. She denies chest pain, no specific abdominal or urinary concerns, no fevers, chills.  Past Medical History  Diagnosis Date  . Hypertension   . Peripheral neuropathy   . Asthma   . High cholesterol   . Diabetes with neurologic complications   . PAD (peripheral artery disease)   . Enlarged heart   . Anginal pain 05/28/12  . Pneumonia 11/2010  . Sleep apnea     cpap  . Hypothyroidism   . Headache(784.0) 05/29/12    "recently"  . Stroke 05/2005; 08/2010    !mini"  . Personal history of gout   . Hypothyroidism   . DJD (degenerative joint disease)   . Carpal tunnel syndrome, bilateral   . Recurrent labyrinthitis   . GERD (gastroesophageal reflux disease)     S/P esophageal dilation in 1996  . Gallstone     Silent  . Fibrocystic breast changes   . Hemorrhoids     with bleeding  . Hx of adenomatous colonic polyps     Dr Sherin QuarryWeissman  . Diverticulitis     left side  . MR (mitral regurgitation)     moderate  . Mild anemia     hemoglobin 11.8 on 06/2009  . Edema of lower extremity     Chronic  . Ischemic colitis 8/13  . Aortic stenosis, moderate   . Chronic diastolic CHF (congestive heart failure)   . Moderate mitral regurgitation   . CKD (chronic kidney disease) stage 3, GFR 30-59 ml/min     "something on labs always say she has some problems"  . Chronic atrial fibrillation     History   Social History  . Marital Status: Widowed    Spouse Name: N/A    Number of Children: 3  . Years of  Education: N/A   Occupational History  . RETIRED     [employer]   Social History Main Topics  . Smoking status: Never Smoker   . Smokeless tobacco: Never Used  . Alcohol Use: No  . Drug Use: No  . Sexual Activity: No   Other Topics Concern  . None   Social History Narrative   Retired. Lives alone. 3 adult children. No tobacco use. Nondrinker. No illicit drug use.     ECHO:  Study Conclusions 01/12/14  - Left ventricle: The cavity size was normal. Wall thickness was normal. Systolic function was normal. The estimated ejection fraction was in the range of 55% to 65%. Wall motion was normal; there were no regional wall motion abnormalities. - Aortic valve: There was very mild stenosis. Valve area: 1.09cm^2(VTI). Valve area: 1.06cm^2 (Vmax). - Mitral valve: The findings are consistent with mild stenosis. Moderate regurgitation. Valve area by continuity equation (using LVOT flow): 1.18cm^2. - Left atrium: The atrium was moderately dilated. - Right atrium: The atrium was moderately dilated. - Tricuspid valve: Severe regurgitation. - Pulmonary arteries: Systolic pressure was mildly increased. PA peak pressure: 48mm Hg (S).    BNP    Component Value  Date/Time   PROBNP 3584.0* 01/12/2014 0455    Education Assessment and Provision:  Detailed education and instructions provided on heart failure disease management including the following:  Signs and symptoms of Heart Failure When to call the physician Importance of daily weights Low sodium diet  Fluid restriction Medication management Anticipated future follow-up appointments  Patient education given on each of the above topics.  Patient was very lethargic as she had just ambulated with PT.  I spoke with her daughter who assists with her care at home and she acknowledges understanding and acceptance of all instructions.   Patient's daughter articulates that Ms. Debo was admitted because of the Lasix being discontinued  by PCP and her subsequent weight gain.  They did attempt to call PCP after weight gain and were told that "it was not concerning".  They seem to have a good understanding of HF instructions and acknowledge adherence  Education Materials:  "Living Better With Heart Failure" Booklet, Daily Weight Tracker Tool and Heart Failure Educational Video.   High Risk Criteria for Readmission and/or Poor Patient Outcomes:    EF <30%-No   2nd admission in 6 months-No  Difficult social situation-No  Demonstrates medication noncompliance-No    Barriers of Care:  None noted  Discharge Planning:  Patient lives alone with a Aid/Sitter who is with her 24 hours a day, 7 days a week.  She is assisted by 2 daughters who are available as needed as well.  She would benefit from [facility] to assist with her transition to home and reinforce education.    Marland Kitchen

## 2014-01-13 NOTE — Progress Notes (Signed)
[facility] PROGRESS NOTE Interim History: 80 yo female with diastolic CHF, HTN, HLD, DM, MR and AS who presented to [facility] ED with main concern of several days duration of progressively worsening shortness of breath, present with exertion and at rest, associated with progressive LE swelling, 3 pillow orthopnea, 10 lbs weight gain. She explains she was told to stop taking Lasix several days ago by PCP due to worsening blood work but she is not sure of the details. She denies chest pain, no specific abdominal or urinary concerns, no fevers, chills.    Filed Weights   01/12/14 0120 01/12/14 0521 01/13/14 0626  Weight: 78.2 kg (172 lb 6.4 oz) 78.2 kg (172 lb 6.4 oz) 77.3 kg (170 lb 6.7 oz)        Intake/Output Summary (Last 24 hours) at 01/13/14 1757 Last data filed at 01/13/14 1300  Gross per 24 hour  Intake    600 ml  Output   1676 ml  Net  -1076 ml     Assessment/Plan: 1-acute resp failure with hypoxia: due to exacerbation of CHF -daily weight and low sodium diet -strict I's and O's (neg 2.7L) and 3 pounds off -continue IV lasix -oxygen supplementation (will wean as tolerated) -continue b-blocker -patient feeling and breathing better today; still with mild fluid overload.  2-enterococcus UTI: resistant to cipro -given allergic reaction to [facility], will treat with fosfomycin (3G PO once) -patient denies dysuria and is afebrile  3-hyponatremia: due to dilution with CHF most likely -improving -will follow BMET  4-acute on chronic renal failure (stage 3 at baseline): due to UTI and decrease perfusion -continue lasix and follow renal function closely -continue abx's for UTI -kidney function stable.  5-hypothyroidism: -continue synthroid  6-atrial fibrillation: rate controlled -continue b-blockers, amiodarone and pradaxa  7-HLD: continue lipitor  8-Physical deconditioning: per PT recommendations will ask CIR to see; patient 24 care at home and able to be discharge with  [facility] services after rehab.   Code Status: Full Family Communication: Son at bedside Disposition Plan: to be detrmine   Consultants:  None   Procedures: ECHO: pending  Antibiotics:  cipro   HPI/Subjective: Feeling  And breathing better, still with some findings of fluid overload (mild crackles on exam and trace to 1+ edema)  Objective: Filed Vitals:   01/13/14 0626 01/13/14 1033 01/13/14 1408 01/13/14 1421  BP:  140/64  107/63  Pulse:  92  77  Temp:    97.7 F (36.5 C)  TempSrc:    Oral  Resp:    18  Height:      Weight: 77.3 kg (170 lb 6.7 oz)     SpO2:   94% 92%     Exam:  General: Alert, awake, oriented x3, improved SOB, no fever.  HEENT: No bruits, no goiter. No JVD Heart: rate controlled, positive SEM, no rubs or gallops. 1++ edema bilaterally Lungs:fair air movement improving; positive bibasilar crackles  Abdomen: Soft, nontender, nondistended, positive bowel sounds.  Neuro: Grossly intact, nonfocal.   Data Reviewed: Basic Metabolic Panel:  Recent Labs Lab 01/11/14 2206 01/12/14 0455 01/13/14 0645  NA 121* 125* 128*  K 5.1 4.9 4.4  CL 85* 88* 89*  CO2 23 25 26   GLUCOSE 193* 123* 100*  BUN 29* 27* 28*  CREATININE 1.13* 1.05 1.07  CALCIUM 9.0 8.8 8.8   CBC:  Recent Labs Lab 01/11/14 2206 01/12/14 0455  WBC 11.3* 11.0*  NEUTROABS 9.0*  --   HGB 12.8 11.5*  HCT 37.5  33.0*  MCV 97.4 95.9  PLT 173 168   Cardiac Enzymes:  Recent Labs Lab 01/11/14 2206  TROPONINI <0.30   BNP (last 3 results)  Recent Labs  04/08/13 1625 01/11/14 2206 01/12/14 0455  PROBNP 2363.0* 2822.0* 3584.0*   CBG:  Recent Labs Lab 01/12/14 0616 01/12/14 1120  GLUCAP 132* 184*    Studies: Dg Chest Port 1 View  01/11/2014   CLINICAL DATA:  Shortness of Breath  EXAM: PORTABLE CHEST - 1 VIEW  COMPARISON:  January 05, 2014  FINDINGS: There is no edema or consolidation. Heart is enlarged with mild pulmonary venous hypertension. No adenopathy. No bone  lesions.  IMPRESSION: Evidence of volume overload with cardiomegaly and pulmonary venous hypertension. No overt edema or consolidation, however. No appreciable effusions.   Electronically Signed   By: Bretta BangWilliam  Woodruff M.D.   On: 01/11/2014 21:58    Scheduled Meds: . allopurinol  300 mg Oral Daily  . amiodarone  200 mg Oral Daily  . atorvastatin  10 mg Oral Daily  . dabigatran  75 mg Oral Q12H  . fosfomycin  3 g Oral Once  . gabapentin  300 mg Oral QHS  . levothyroxine  88 mcg Oral QAC breakfast  . metoprolol succinate  12.5 mg Oral Daily  . oxybutynin  10 mg Oral QHS  . polycarbophil  625 mg Oral BID  . potassium chloride  10 mEq Oral BID  . sodium chloride  3 mL Intravenous Q12H  . sodium chloride  3 mL Intravenous Q12H   Continuous Infusions:   Time> 30 minutes  Vassie LollMadera, Chantel Teti  [facility] Pager [phone]. If 8PM-8AM, please contact night-coverage at [email or web address], password [email or web address] 01/13/2014, 5:57 PM  LOS: 2 days

## 2014-01-13 NOTE — Progress Notes (Signed)
Rehab Admissions Coordinator Note:  Patient was screened by Trish MageLogue, Amela Handley M for appropriateness for an Inpatient Acute Rehab Consult.  At this time, we are recommending Inpatient Rehab consult.  Trish MageLogue, Kileen Lange M 01/13/2014, 2:34 PM  I can be reached at [phone].

## 2014-01-13 NOTE — Plan of Care (Signed)
Problem: Phase I Progression Outcomes Goal: EF % per last Echo/documented,Core Reminder form on chart Outcome: Adequate for Discharge EF 55-60%     

## 2014-01-14 DIAGNOSIS — I5021 Acute systolic (congestive) heart failure: Secondary | ICD-10-CM

## 2014-01-14 DIAGNOSIS — I059 Rheumatic mitral valve disease, unspecified: Secondary | ICD-10-CM

## 2014-01-14 DIAGNOSIS — R5381 Other malaise: Secondary | ICD-10-CM

## 2014-01-14 LAB — BASIC METABOLIC PANEL
BUN: 29 mg/dL — AB (ref 6–23)
CALCIUM: 8.2 mg/dL — AB (ref 8.4–10.5)
CO2: 25 mEq/L (ref 19–32)
Chloride: 88 mEq/L — ABNORMAL LOW (ref 96–112)
Creatinine, Ser: 1.04 mg/dL (ref 0.50–1.10)
GFR calc non Af Amer: 47 mL/min — ABNORMAL LOW (ref >90)
GFR, EST AFRICAN AMERICAN: 54 mL/min — AB (ref >90)
Glucose, Bld: 112 mg/dL — ABNORMAL HIGH (ref 70–99)
Potassium: 3.5 mEq/L — ABNORMAL LOW (ref 3.7–5.3)
Sodium: 129 mEq/L — ABNORMAL LOW (ref 137–147)

## 2014-01-14 MED ORDER — POTASSIUM CHLORIDE CRYS ER 20 MEQ PO TBCR
40.0000 meq | EXTENDED_RELEASE_TABLET | Freq: Two times a day (BID) | ORAL | Status: AC
  Administered 2014-01-14 (×2): 40 meq via ORAL
  Filled 2014-01-14 (×2): qty 2

## 2014-01-14 MED ORDER — FUROSEMIDE 10 MG/ML IJ SOLN
4.0000 mg/h | INTRAVENOUS | Status: AC
  Administered 2014-01-14: 4 mg/h via INTRAVENOUS
  Filled 2014-01-14 (×2): qty 25

## 2014-01-14 NOTE — Progress Notes (Signed)
[facility] PROGRESS NOTE Interim History: 80 yo female with diastolic CHF, HTN, HLD, DM, MR and AS who [facility] with main concern of several days duration of progressively worsening shortness of breath, present with exertion and at rest, associated with progressive LE swelling, 3 pillow orthopnea, 10 lbs weight gain. She explains she was told to stop taking Lasix several days ago by PCP due to worsening blood work but she is not sure of the details. She denies chest pain, no specific abdominal or urinary concerns, no fevers, chills.  Filed Weights   01/12/14 0521 01/13/14 0626 01/14/14 0502  Weight: 78.2 kg (172 lb 6.4 oz) 77.3 kg (170 lb 6.7 oz) 75.66 kg (166 lb 12.8 oz)        Intake/Output Summary (Last 24 hours) at 01/14/14 0830 Last data filed at 01/14/14 0503  Gross per 24 hour  Intake    900 ml  Output   1350 ml  Net   -450 ml     Assessment/Plan: Acute systolic heart failure/  Aortic stenosis, moderate/Moderate mitral regurgitation: - Echo does not show systolic function decrease, but she has behave like systolic heart failure, she also have valvular disorder which can explain her systolic dysfunction. - Cont lasix change to ggt, replete electrolytes. Bp stable - monitor weight, strict I and O's. - CIR evaluation pending  Enterococcus UTI:  - Resistant to cipro  - [facility], will treat with fosfomycin (3G PO once)  - Patient denies dysuria and is afebrile   Hyponatremia:  - Due to dilution with CHF most likely  - Improving with diureses.  Acute on chronic renal failure (stage 3 at baseline):  - Due to UTI and decrease perfusion   HTN (hypertension) -  Chronic atrial fibrillation: -continue b-blockers, amiodarone and pradaxa   Physical deconditioning:  - per PT recommendations will ask CIR to see; patient 24 care at home and [facility]    Code Status: Full  Family Communication:  daughters at bedside  Disposition Plan: to be detrmine   Consultants:  none  Procedures: ECHO 3.18.20415:ejection fraction was in the range of 55% to 65%. Wall motion was normal; there were no regional wall motion Abnormalities. - Aortic valve: There was very mild stenosis. Valve area: 1.09cm^2(VTI). Valve area: 1.06cm^2 (Vmax). - Mitral valve: The findings are consistent with mild stenosis. Moderate regurgitation. Valve area by continuity equation (using LVOT flow): 1.18cm^2.   Antibiotics:  none  HPI/Subjective: No complains  Objective: Filed Vitals:   01/13/14 1421 01/13/14 2023 01/13/14 2300 01/14/14 0502  BP: 107/63 116/66  104/81  Pulse: 77 80 80 75  Temp: 97.7 F (36.5 C) 97.6 F (36.4 C)  98.4 F (36.9 C)  TempSrc: Oral Oral  Oral  Resp: 18 18 16 18   Height:      Weight:    75.66 kg (166 lb 12.8 oz)  SpO2: 92% 95% 95% 95%     Exam:  General: Alert, awake, oriented x3, in no acute distress.  HEENT: No bruits, no goiter. +JVD Heart: Regular rate and rhythm, trace edema  Lungs: Good air movement, clear  Abdomen: Soft, nontender, nondistended, positive bowel sounds.    Data Reviewed: Basic Metabolic Panel:  Recent Labs Lab 01/11/14 2206 01/12/14 0455 01/13/14 0645 01/14/14 0340  NA 121* 125* 128* 129*  K 5.1 4.9 4.4 3.5*  CL 85* 88* 89* 88*  CO2 23 25 26 25   GLUCOSE  193* 123* 100* 112*  BUN 29* 27* 28* 29*  CREATININE 1.13* 1.05 1.07 1.04  CALCIUM 9.0 8.8 8.8 8.2*   Liver Function Tests: No results found for this basename: AST, ALT, ALKPHOS, BILITOT, PROT, ALBUMIN,  in the last 168 hours No results found for this basename: LIPASE, AMYLASE,  in the last 168 hours No results found for this basename: AMMONIA,  in the last 168 hours CBC:  Recent Labs Lab 01/11/14 2206 01/12/14 0455  WBC 11.3* 11.0*  NEUTROABS 9.0*  --   HGB 12.8 11.5*  HCT 37.5 33.0*  MCV 97.4 95.9  PLT 173 168   Cardiac Enzymes:  Recent Labs Lab 01/11/14 2206   TROPONINI <0.30   BNP (last 3 results)  Recent Labs  04/08/13 1625 01/11/14 2206 01/12/14 0455  PROBNP 2363.0* 2822.0* 3584.0*   CBG:  Recent Labs Lab 01/12/14 0616 01/12/14 1120  GLUCAP 132* 184*    Recent Results (from the past 240 hour(s))  URINE CULTURE     Status: None   Collection Time    01/12/14  1:03 AM      Result Value Ref Range Status   Specimen Description URINE, CLEAN CATCH   Final   Special Requests NONE   Final   Culture  Setup Time     Final   Value: 01/12/2014 01:19     Performed at [facility] Count     Final   Value: 70,000 COLONIES/ML     Performed at [facility]   Culture     Final   Value: ENTEROCOCCUS SPECIES     Performed at [facility]   Report Status 01/13/2014 FINAL   Final   Organism ID, Bacteria ENTEROCOCCUS SPECIES   Final     Studies: No results found.  Scheduled Meds: . allopurinol  300 mg Oral Daily  . amiodarone  200 mg Oral Daily  . atorvastatin  10 mg Oral Daily  . dabigatran  75 mg Oral Q12H  . furosemide  40 mg Intravenous BID  . gabapentin  300 mg Oral QHS  . levothyroxine  88 mcg Oral QAC breakfast  . metoprolol succinate  12.5 mg Oral Daily  . oxybutynin  10 mg Oral QHS  . polycarbophil  625 mg Oral BID  . potassium chloride  10 mEq Oral BID  . sodium chloride  3 mL Intravenous Q12H  . sodium chloride  3 mL Intravenous Q12H   Continuous Infusions:    Marinda Elk  [facility] Pager [phone] If 8PM-8AM, please contact night-coverage at [email or web address], password [facility] 01/14/2014, 8:30 AM  LOS: 3 days

## 2014-01-14 NOTE — Consult Note (Signed)
Physical Medicine and Rehabilitation Consult Reason for Consult: Deconditioning related to CHF Referring Physician: [facility]   HPI: [name] is a 80 y.o. right-handed female with history of hypertension, mitral regurgitation and aortic stenosis/atrial fibrillation maintained on Pradaxa. Patient lives alone with 24-hour hired care. Presented 01/11/2014 with progressive shortness of breath and lower extremity edema as well as a 10 pound weight gain. Chest x-ray with evidence of volume overload. Noted sodium level 121 on admission. Patient was placed on intravenous Lasix. Echocardiogram with ejection fraction of 65% no wall motion abnormalities there is mild stenosis with aortic valve as well as moderate mitral regurgitation. Findings of enterococcus UTI and received one dose of Monurol. Sodium level improving to 129. Physical therapy followup 01/13/2014 with recommendations for physical medicine rehabilitation consult.  Pt just started on IV Lasix drip.   Review of Systems  Respiratory: Positive for shortness of breath.   Cardiovascular: Positive for palpitations and leg swelling.       Anginal pain  Gastrointestinal: Positive for constipation.       GERD  Neurological: Positive for headaches.  All other systems reviewed and are negative.   Past Medical History  Diagnosis Date  . Hypertension   . Peripheral neuropathy   . Asthma   . High cholesterol   . Diabetes with neurologic complications   . PAD (peripheral artery disease)   . Enlarged heart   . Anginal pain 05/28/12  . Pneumonia 11/2010  . Sleep apnea     cpap  . Hypothyroidism   . Headache(784.0) 05/29/12    "recently"  . Stroke 05/2005; 08/2010    !mini"  . Personal history of gout   . Hypothyroidism   . DJD (degenerative joint disease)   . Carpal tunnel syndrome, bilateral   . Recurrent labyrinthitis   . GERD (gastroesophageal reflux disease)     S/P esophageal dilation in 1996  . Gallstone     Silent    . Fibrocystic breast changes   . Hemorrhoids     with bleeding  . Hx of adenomatous colonic polyps     Dr Sherin QuarryWeissman  . Diverticulitis     left side  . MR (mitral regurgitation)     moderate  . Mild anemia     hemoglobin 11.8 on 06/2009  . Edema of lower extremity     Chronic  . Ischemic colitis 8/13  . Aortic stenosis, moderate   . Chronic diastolic CHF (congestive heart failure)   . Moderate mitral regurgitation   . CKD (chronic kidney disease) stage 3, GFR 30-59 ml/min     "something on labs always say she has some problems"  . Chronic atrial fibrillation    Past Surgical History  Procedure Laterality Date  . Tonsillectomy and adenoidectomy  1960's  . Dilation and curettage of uterus    . Vaginal hysterectomy  1970's  . Total knee arthroplasty  1970-~2002    left; right  . Cataract extraction w/ intraocular lens  implant, bilateral  ?1970's   Family History  Problem Relation Age of Onset  . Heart disease Mother   . Heart disease Father   . Heart disease Brother    Social History:  reports that she has never smoked. She has never used smokeless tobacco. She reports that she does not drink alcohol or use illicit drugs. Allergies:  Allergies  Allergen Reactions  . Aspirin Other (See Comments)    Tears my stomach up; "can take coated Aspirin  qd without problems"  . Nexium [Esomeprazole Magnesium] Rash  . Penicillins Rash    Tolerates Rocephin   Medications Prior to Admission  Medication Sig Dispense Refill  . acetaminophen (TYLENOL) 500 MG tablet Take 500 mg by mouth every 6 (six) hours as needed for pain.      Marland Kitchen allopurinol (ZYLOPRIM) 300 MG tablet Take 300 mg by mouth daily.      Marland Kitchen amiodarone (PACERONE) 200 MG tablet Take 1 tablet (200 mg total) by mouth daily.  30 tablet  1  . atorvastatin (LIPITOR) 10 MG tablet Take 10 mg by mouth daily.      . Calcium Carbonate-Vitamin D (CALCIUM-VITAMIN D) 600-200 MG-UNIT CAPS Take 1 tablet by mouth daily.       .  ciprofloxacin (CIPRO) 250 MG tablet Take 250 mg by mouth 2 (two) times daily.      . dabigatran (PRADAXA) 75 MG CAPS Take 75 mg by mouth every 12 (twelve) hours.      . gabapentin (NEURONTIN) 300 MG capsule Take 300 mg by mouth at bedtime.      . Garlic (GARLIQUE) 400 MG TBEC Take 400 mg by mouth daily.       Marland Kitchen Ketotifen Fumarate (ZADITOR OP) Place 1 drop into both eyes daily as needed (for dry eyes).      . L-methylfolate Calcium 7.5 MG TABS Take 1 tablet by mouth 2 (two) times daily.      . Lancets (FREESTYLE) lancets       . levothyroxine (SYNTHROID, LEVOTHROID) 88 MCG tablet Take 88 mcg by mouth daily.      . metoprolol succinate (TOPROL-XL) 25 MG 24 hr tablet Take 0.5 tablets (12.5 mg total) by mouth daily.  15 tablet  1  . Multiple Vitamins-Minerals (MULTIVITAMIN WITH MINERALS) tablet Take 1 tablet by mouth daily.      . Omega-3 Fatty Acids (FISH OIL) 1000 MG CAPS Take 1,000 mg by mouth daily.       . ONE TOUCH ULTRA TEST test strip       . oxybutynin (DITROPAN) 5 MG tablet Take 10 mg by mouth at bedtime.       . polycarbophil (FIBERCON) 625 MG tablet Take 625 mg by mouth 2 (two) times daily.      . potassium chloride (K-DUR,KLOR-CON) 10 MEQ tablet Take 10 mEq by mouth 2 (two) times daily.      . Probiotic Product (PROBIOTIC DAILY PO) Take 1 tablet by mouth daily.       . vitamin C (ASCORBIC ACID) 500 MG tablet Take 500 mg by mouth daily.      . zaleplon (SONATA) 5 MG capsule Take 10 mg by mouth at bedtime.         Home: Home Living Family/patient expects to be discharged to:: Private residence Living Arrangements: Alone Available Help at Discharge: Personal care attendant;Available 24 hours/day Type of Home: House Home Access: Stairs to enter [employer] of Steps: 2 Entrance Stairs-Rails: Right Home Layout: One level Home Equipment: Walker - 4 wheels;Grab bars - tub/shower;Grab bars - toilet  Functional History:   Functional Status:  Mobility:      Ambulation/Gait Ambulation Distance (Feet): 5 Feet General Gait Details: pt walked 5' forward and initially started with increased steps then after 4' pt unable to advance feet, could not turn or back up and unable to state fatigue with dgtr able to bring chair behind pt to safely sit at surface with max cueing for hand placement and assist  to control descent    ADL:    Cognition: Cognition Overall Cognitive Status: Impaired/Different from baseline Cognition Arousal/Alertness: Awake/alert Behavior During Therapy: Flat affect Overall Cognitive Status: Impaired/Different from baseline Area of Impairment: Memory;Following commands Memory: Decreased short-term memory Following Commands: Follows one step commands inconsistently;Follows one step commands with increased time General Comments: pt with baseline cognitive deficits but dgtr present stating pt is having increased difficulty following commands recently  Blood pressure 104/81, pulse 75, temperature 98.4 F (36.9 C), temperature source Oral, resp. rate 18, height 5\' 7"  (1.702 m), weight 75.66 kg (166 lb 12.8 oz), SpO2 95.00%. Physical Exam  Vitals reviewed. Constitutional: She is oriented to person, place, and time. She appears well-developed.  HENT:  Head: Normocephalic.  Eyes: EOM are normal.  Neck: Normal range of motion. Neck supple. No thyromegaly present.  Cardiovascular:  Cardiac rate controlled  Respiratory: Effort normal and breath sounds normal. No respiratory distress.  GI: Soft. Bowel sounds are normal. She exhibits no distension.  Neurological: She is alert and oriented to person, place, and time.  Skin: Skin is warm and dry.  Psychiatric: She has a normal mood and affect.    Results for orders placed during the hospital encounter of 01/11/14 (from the past 24 hour(s))  BASIC METABOLIC PANEL     Status: Abnormal   Collection Time    01/13/14  6:45 AM      Result Value Ref Range   Sodium 128 (*) 137 - 147  mEq/L   Potassium 4.4  3.7 - 5.3 mEq/L   Chloride 89 (*) 96 - 112 mEq/L   CO2 26  19 - 32 mEq/L   Glucose, Bld 100 (*) 70 - 99 mg/dL   BUN 28 (*) 6 - 23 mg/dL   Creatinine, Ser 4.09  0.50 - 1.10 mg/dL   Calcium 8.8  8.4 - 81.1 mg/dL   GFR calc non Af Amer 45 (*) >90 mL/min   GFR calc Af Amer 53 (*) >90 mL/min  BASIC METABOLIC PANEL     Status: Abnormal   Collection Time    01/14/14  3:40 AM      Result Value Ref Range   Sodium 129 (*) 137 - 147 mEq/L   Potassium 3.5 (*) 3.7 - 5.3 mEq/L   Chloride 88 (*) 96 - 112 mEq/L   CO2 25  19 - 32 mEq/L   Glucose, Bld 112 (*) 70 - 99 mg/dL   BUN 29 (*) 6 - 23 mg/dL   Creatinine, Ser 9.14  0.50 - 1.10 mg/dL   Calcium 8.2 (*) 8.4 - 10.5 mg/dL   GFR calc non Af Amer 47 (*) >90 mL/min   GFR calc Af Amer 54 (*) >90 mL/min   No results found.  Assessment/Plan: Diagnosis: Deconditioning secondary to exacerbation of CHF 1. Does the need for close, 24 hr/day medical supervision in concert with the patient's rehab needs make it unreasonable for this patient to be served in a less intensive setting? Potentially 2. Co-Morbidities requiring supervision/potential complications: Chronic atrial fibrillation, aortic stenosis, diabetes 3. Due to bladder management, bowel management, safety, skin/wound care, disease management, medication administration and patient education, does the patient require 24 hr/day rehab nursing? Potentially 4. Does the patient require coordinated care of a physician, rehab nurse, PT (1-2 hrs/day, 5 days/week) and OT (1-2 hrs/day, 5 days/week) to address physical and functional deficits in the context of the above medical diagnosis(es)? Potentially Addressing deficits in the following areas: balance, endurance, locomotion, strength, transferring, bowel/bladder  control, bathing, dressing, feeding, grooming and toileting 5. Can the patient actively participate in an intensive therapy program of at least 3 hrs of therapy per day at least  5 days per week? Potentially 6. The potential for patient to make measurable gains while on inpatient rehab is good 7. Anticipated functional outcomes upon discharge from inpatient rehab are supervision with PT, supervision with OT, not applicable with SLP. 8. Estimated rehab length of stay to reach the above functional goals is: 10-12 days 9. Does the patient have adequate social supports to accommodate these discharge functional goals? Potentially 10. Anticipated D/C setting: Home 11. Anticipated post D/C treatments: HH therapy 12. Overall Rehab/Functional Prognosis: good  RECOMMENDATIONS: This patient's condition is appropriate for continued rehabilitative care in the following setting: Ready for CIR once off IV Lasix trip and tolerating therapy Patient has agreed to participate in recommended program. Yes Note that insurance prior authorization may be required for reimbursement for recommended care.  Comment: Just started IV lasix drip    01/14/2014

## 2014-01-14 NOTE — Progress Notes (Signed)
Patient drowsy but easily arousable throughout shift.  Patient and family attribute this to PRN Ambien administration overnight and request that patient not receive any more.  Night shift RN notified during report.  Vital signs stable.  Patient incontinent when asleep and continent in bedpan when awake throughout shift.  Family at bedside this afternoon and evening.  Patient and family deny any additional questions or concerns at this time.  Will continue to monitor.

## 2014-01-14 NOTE — Progress Notes (Signed)
Rehab admissions - Evaluated for possible admission.  Patient asleep when I went to see her.  I will follow for rehab needs and medical readiness.  Call me for questions.  #[phone]

## 2014-01-15 LAB — BASIC METABOLIC PANEL
BUN: 29 mg/dL — ABNORMAL HIGH (ref 6–23)
CALCIUM: 8.1 mg/dL — AB (ref 8.4–10.5)
CHLORIDE: 92 meq/L — AB (ref 96–112)
CO2: 29 mEq/L (ref 19–32)
CREATININE: 1.17 mg/dL — AB (ref 0.50–1.10)
GFR, EST AFRICAN AMERICAN: 47 mL/min — AB (ref >90)
GFR, EST NON AFRICAN AMERICAN: 41 mL/min — AB (ref >90)
Glucose, Bld: 95 mg/dL (ref 70–99)
Potassium: 4.1 mEq/L (ref 3.7–5.3)
Sodium: 133 mEq/L — ABNORMAL LOW (ref 137–147)

## 2014-01-15 MED ORDER — FUROSEMIDE 10 MG/ML IJ SOLN
4.0000 mg/h | INTRAVENOUS | Status: DC
  Administered 2014-01-15: 4 mg/h via INTRAVENOUS
  Filled 2014-01-15: qty 25

## 2014-01-15 MED ORDER — FUROSEMIDE 10 MG/ML IJ SOLN: 4.0000 mg/h | INTRAVENOUS | Status: DC

## 2014-01-15 MED ORDER — ZALEPLON 10 MG PO CAPS: 10.0000 mg | ORAL_CAPSULE | Freq: Every day | ORAL | Status: DC

## 2014-01-15 NOTE — Progress Notes (Signed)
Patient evaluated for community based chronic disease management services with [facility] as a benefit of patient's [employer]. Spoke with patient at bedside to explain [facility] services.  Patient has refused services at this time citing that she has 24/7 caregivers [facility].  In addition, she has a very supportive daughter who is very engaged.  Left contact information and [facility] literature at bedside. Made Inpatient Case Manager aware that [facility] following. Of note, [facility] services does not replace or interfere with any services that are arranged by inpatient case management or social work.  For additional questions or referrals please contact Anibal Hendersonim Henderson BSN RN [facility] Hospital Liaison at [phone].

## 2014-01-15 NOTE — Progress Notes (Signed)
[facility] PROGRESS NOTE Interim History: 80 yo female with diastolic CHF, HTN, HLD, DM, MR and AS who [facility] with main concern of several days duration of progressively worsening shortness of breath, present with exertion and at rest, associated with progressive LE swelling, 3 pillow orthopnea, 10 lbs weight gain. She explains she was told to stop taking Lasix several days ago by PCP due to worsening blood work but she is not sure of the details. She denies chest pain, no specific abdominal or urinary concerns, no fevers, chills.  Filed Weights   01/13/14 0626 01/14/14 0502 01/15/14 0414  Weight: 77.3 kg (170 lb 6.7 oz) 75.66 kg (166 lb 12.8 oz) 74.3 kg (163 lb 12.8 oz)        Intake/Output Summary (Last 24 hours) at 01/15/14 0753 Last data filed at 01/15/14 0700  Gross per 24 hour  Intake 545.13 ml  Output   1550 ml  Net -1004.87 ml     Assessment/Plan: Acute systolic heart failure/  Aortic stenosis, moderate/Moderate mitral regurgitation: - Echo: as below - due to on compliance. +JVD. - Cont lasix  ggt, replete electrolytes. Bp stable - monitor weight, strict I and O's. - CIR once stable. - 78.2->74. Kg.  Enterococcus UTI:  - Resistant to cipro  - Given [facility] to [facility], will treat with fosfomycin (3G PO once)  - Patient denies dysuria and is afebrile   Hyponatremia:  - Due  CHF most likely  - Improving with diureses.  Acute on chronic renal failure (stage 3 at baseline):  - Due to UTI and decrease perfusion   HTN (hypertension) - Improved with diuresis.  Chronic atrial fibrillation: -continue b-blockers, amiodarone and pradaxa   Physical deconditioning:  - per PT recommendations will ask CIR to see; patient 24 care at home and able [facility]  Protein caloric malnutrition: - ensure.  Code Status: Full  Family Communication: daughters at bedside  Disposition Plan: to be  detrmine   Consultants:  none  Procedures: ECHO 3.18.20415:ejection fraction was in the range of 55% to 65%. Wall motion was normal; there were no regional wall motion Abnormalities. - Aortic valve: There was very mild stenosis. Valve area: 1.09cm^2(VTI). Valve area: 1.06cm^2 (Vmax). - Mitral valve: The findings are consistent with mild stenosis. Moderate regurgitation. Valve area by continuity equation (using LVOT flow): 1.18cm^2.   Antibiotics:  none  HPI/Subjective: No complains Relates her breathing is better  Objective: Filed Vitals:   01/14/14 1816 01/14/14 2009 01/14/14 2200 01/15/14 0414  BP: 106/66 103/65  109/63  Pulse: 80 81 80 78  Temp:  97.3 F (36.3 C)  97.5 F (36.4 C)  TempSrc:  Oral  Oral  Resp: 18 18 16 18   Height:      Weight:    74.3 kg (163 lb 12.8 oz)  SpO2: 98% 94%  96%     Exam:  General: Alert, awake, oriented x3, in no acute distress.  HEENT: No bruits, no goiter. +JVD Heart: Regular rate and rhythm, no lower extremity edema. Lungs: Good air movement, clear  Abdomen: Soft, nontender, nondistended, positive bowel sounds.    Data Reviewed: Basic Metabolic Panel:  Recent Labs Lab 01/11/14 2206 01/12/14 0455 01/13/14 0645 01/14/14 0340 01/15/14 0423  NA 121* 125* 128* 129* 133*  K 5.1 4.9 4.4 3.5* 4.1  CL 85* 88* 89* 88* 92*  CO2 23 25 26 25 29   GLUCOSE 193* 123* 100* 112* 95  BUN 29* 27* 28* 29* 29*  CREATININE 1.13* 1.05 1.07 1.04 1.17*  CALCIUM 9.0 8.8 8.8 8.2* 8.1*   Liver Function Tests: No results found for this basename: AST, ALT, ALKPHOS, BILITOT, PROT, ALBUMIN,  in the last 168 hours No results found for this basename: LIPASE, AMYLASE,  in the last 168 hours No results found for this basename: AMMONIA,  in the last 168 hours CBC:  Recent Labs Lab 01/11/14 2206 01/12/14 0455  WBC 11.3* 11.0*  NEUTROABS 9.0*  --   HGB 12.8 11.5*  HCT 37.5 33.0*  MCV 97.4 95.9  PLT 173 168   Cardiac Enzymes:  Recent  Labs Lab 01/11/14 2206  TROPONINI <0.30   BNP (last 3 results)  Recent Labs  04/08/13 1625 01/11/14 2206 01/12/14 0455  PROBNP 2363.0* 2822.0* 3584.0*   CBG:  Recent Labs Lab 01/12/14 0616 01/12/14 1120  GLUCAP 132* 184*    Recent Results (from the past 240 hour(s))  URINE CULTURE     Status: None   Collection Time    01/12/14  1:03 AM      Result Value Ref Range Status   Specimen Description URINE, CLEAN CATCH   Final   Special Requests NONE   Final   Culture  Setup Time     Final   Value: 01/12/2014 01:19     Performed at [facility] Count     Final   Value: 70,000 COLONIES/ML     Performed at [facility]   Culture     Final   Value: ENTEROCOCCUS SPECIES     Performed at [facility]   Report Status 01/13/2014 FINAL   Final   Organism ID, Bacteria ENTEROCOCCUS SPECIES   Final     Studies: No results found.  Scheduled Meds: . allopurinol  300 mg Oral Daily  . amiodarone  200 mg Oral Daily  . atorvastatin  10 mg Oral Daily  . dabigatran  75 mg Oral Q12H  . gabapentin  300 mg Oral QHS  . levothyroxine  88 mcg Oral QAC breakfast  . metoprolol succinate  12.5 mg Oral Daily  . oxybutynin  10 mg Oral QHS  . polycarbophil  625 mg Oral BID  . sodium chloride  3 mL Intravenous Q12H  . sodium chloride  3 mL Intravenous Q12H   Continuous Infusions: . furosemide (LASIX) infusion 4 mg/hr (01/14/14 1900)     Marinda Elk  [facility] Pager [phone] If 8PM-8AM, please contact night-coverage at [email or web address], password [facility] 01/15/2014, 7:53 AM  LOS: 4 days

## 2014-01-15 NOTE — Progress Notes (Signed)
RT placed patient on home cpap per home settings via home nasal mask with 2lpm 02 bleed in. RT will continue to monitor.

## 2014-01-15 NOTE — Progress Notes (Signed)
Utilization review completed.  

## 2014-01-16 ENCOUNTER — Encounter (HOSPITAL_COMMUNITY): Payer: Self-pay | Admitting: *Deleted

## 2014-01-16 ENCOUNTER — Inpatient Hospital Stay (HOSPITAL_COMMUNITY)
Admission: RE | Admit: 2014-01-16 | Discharge: 2014-01-31 | DRG: 945 | Disposition: A | Discharge status: Home Health | Payer: Medicare Other | Source: Intra-hospital | Attending: Physical Medicine & Rehabilitation | Admitting: Physical Medicine & Rehabilitation

## 2014-01-16 DIAGNOSIS — Z96659 Presence of unspecified artificial knee joint: Secondary | ICD-10-CM

## 2014-01-16 DIAGNOSIS — Z5189 Encounter for other specified aftercare: Principal | ICD-10-CM

## 2014-01-16 DIAGNOSIS — I10 Essential (primary) hypertension: Secondary | ICD-10-CM

## 2014-01-16 DIAGNOSIS — E871 Hypo-osmolality and hyponatremia: Secondary | ICD-10-CM | POA: Diagnosis present

## 2014-01-16 DIAGNOSIS — E039 Hypothyroidism, unspecified: Secondary | ICD-10-CM | POA: Diagnosis present

## 2014-01-16 DIAGNOSIS — R5381 Other malaise: Secondary | ICD-10-CM

## 2014-01-16 DIAGNOSIS — N390 Urinary tract infection, site not specified: Secondary | ICD-10-CM | POA: Diagnosis not present

## 2014-01-16 DIAGNOSIS — B952 Enterococcus as the cause of diseases classified elsewhere: Secondary | ICD-10-CM | POA: Diagnosis not present

## 2014-01-16 DIAGNOSIS — I509 Heart failure, unspecified: Secondary | ICD-10-CM

## 2014-01-16 DIAGNOSIS — J45909 Unspecified asthma, uncomplicated: Secondary | ICD-10-CM | POA: Diagnosis present

## 2014-01-16 DIAGNOSIS — I350 Nonrheumatic aortic (valve) stenosis: Secondary | ICD-10-CM

## 2014-01-16 DIAGNOSIS — I5030 Unspecified diastolic (congestive) heart failure: Secondary | ICD-10-CM

## 2014-01-16 DIAGNOSIS — E1149 Type 2 diabetes mellitus with other diabetic neurological complication: Secondary | ICD-10-CM

## 2014-01-16 DIAGNOSIS — I4891 Unspecified atrial fibrillation: Secondary | ICD-10-CM | POA: Diagnosis present

## 2014-01-16 DIAGNOSIS — N183 Chronic kidney disease, stage 3 unspecified: Secondary | ICD-10-CM | POA: Diagnosis present

## 2014-01-16 DIAGNOSIS — Z79899 Other long term (current) drug therapy: Secondary | ICD-10-CM

## 2014-01-16 DIAGNOSIS — I5021 Acute systolic (congestive) heart failure: Secondary | ICD-10-CM

## 2014-01-16 DIAGNOSIS — R51 Headache: Secondary | ICD-10-CM | POA: Diagnosis present

## 2014-01-16 DIAGNOSIS — I129 Hypertensive chronic kidney disease with stage 1 through stage 4 chronic kidney disease, or unspecified chronic kidney disease: Secondary | ICD-10-CM | POA: Diagnosis present

## 2014-01-16 DIAGNOSIS — E119 Type 2 diabetes mellitus without complications: Secondary | ICD-10-CM | POA: Diagnosis present

## 2014-01-16 DIAGNOSIS — K5900 Constipation, unspecified: Secondary | ICD-10-CM | POA: Diagnosis present

## 2014-01-16 DIAGNOSIS — K219 Gastro-esophageal reflux disease without esophagitis: Secondary | ICD-10-CM | POA: Diagnosis present

## 2014-01-16 DIAGNOSIS — Z8673 Personal history of transient ischemic attack (TIA), and cerebral infarction without residual deficits: Secondary | ICD-10-CM

## 2014-01-16 DIAGNOSIS — R5383 Other fatigue: Secondary | ICD-10-CM

## 2014-01-16 DIAGNOSIS — I080 Rheumatic disorders of both mitral and aortic valves: Secondary | ICD-10-CM | POA: Diagnosis present

## 2014-01-16 DIAGNOSIS — I5032 Chronic diastolic (congestive) heart failure: Secondary | ICD-10-CM | POA: Diagnosis present

## 2014-01-16 LAB — COMPREHENSIVE METABOLIC PANEL
ALT: 40 U/L — ABNORMAL HIGH (ref 0–35)
AST: 62 U/L — ABNORMAL HIGH (ref 0–37)
Albumin: 2.3 g/dL — ABNORMAL LOW (ref 3.5–5.2)
Alkaline Phosphatase: 110 U/L (ref 39–117)
BUN: 31 mg/dL — ABNORMAL HIGH (ref 6–23)
CO2: 29 mEq/L (ref 19–32)
Calcium: 8.1 mg/dL — ABNORMAL LOW (ref 8.4–10.5)
Chloride: 91 mEq/L — ABNORMAL LOW (ref 96–112)
Creatinine, Ser: 1.44 mg/dL — ABNORMAL HIGH (ref 0.50–1.10)
GFR calc Af Amer: 37 mL/min — ABNORMAL LOW (ref >90)
GFR calc non Af Amer: 32 mL/min — ABNORMAL LOW (ref >90)
Glucose, Bld: 164 mg/dL — ABNORMAL HIGH (ref 70–99)
Potassium: 3.7 mEq/L (ref 3.7–5.3)
Sodium: 133 mEq/L — ABNORMAL LOW (ref 137–147)
Total Bilirubin: 0.8 mg/dL (ref 0.3–1.2)
Total Protein: 6.1 g/dL (ref 6.0–8.3)

## 2014-01-16 LAB — BASIC METABOLIC PANEL
BUN: 29 mg/dL — ABNORMAL HIGH (ref 6–23)
CO2: 29 mEq/L (ref 19–32)
CREATININE: 1.07 mg/dL (ref 0.50–1.10)
Calcium: 8.2 mg/dL — ABNORMAL LOW (ref 8.4–10.5)
Chloride: 92 mEq/L — ABNORMAL LOW (ref 96–112)
GFR, EST AFRICAN AMERICAN: 53 mL/min — AB (ref >90)
GFR, EST NON AFRICAN AMERICAN: 45 mL/min — AB (ref >90)
Glucose, Bld: 97 mg/dL (ref 70–99)
Potassium: 3.4 mEq/L — ABNORMAL LOW (ref 3.7–5.3)
SODIUM: 133 meq/L — AB (ref 137–147)

## 2014-01-16 MED ORDER — OXYBUTYNIN CHLORIDE 5 MG PO TABS
10.0000 mg | ORAL_TABLET | Freq: Every day | ORAL | Status: DC
  Administered 2014-01-16 – 2014-01-30 (×15): 10 mg via ORAL
  Filled 2014-01-16 (×16): qty 2

## 2014-01-16 MED ORDER — LEVOTHYROXINE SODIUM 88 MCG PO TABS
88.0000 ug | ORAL_TABLET | Freq: Every day | ORAL | Status: DC
  Administered 2014-01-17 – 2014-01-31 (×15): 88 ug via ORAL
  Filled 2014-01-16 (×16): qty 1

## 2014-01-16 MED ORDER — GABAPENTIN 300 MG PO CAPS
300.0000 mg | ORAL_CAPSULE | Freq: Every day | ORAL | Status: DC
  Administered 2014-01-16 – 2014-01-30 (×15): 300 mg via ORAL
  Filled 2014-01-16 (×17): qty 1

## 2014-01-16 MED ORDER — FUROSEMIDE 40 MG PO TABS: 40.0000 mg | ORAL_TABLET | Freq: Two times a day (BID) | ORAL | Status: AC

## 2014-01-16 MED ORDER — SORBITOL 70 % SOLN: 30.0000 mL | Freq: Every day | Status: DC | PRN

## 2014-01-16 MED ORDER — ONDANSETRON HCL 4 MG PO TABS: 4.0000 mg | ORAL_TABLET | Freq: Four times a day (QID) | ORAL | Status: DC | PRN

## 2014-01-16 MED ORDER — ONDANSETRON HCL 4 MG/2ML IJ SOLN: 4.0000 mg | Freq: Four times a day (QID) | INTRAMUSCULAR | Status: DC | PRN

## 2014-01-16 MED ORDER — AMIODARONE HCL 200 MG PO TABS
200.0000 mg | ORAL_TABLET | Freq: Every day | ORAL | Status: DC
  Administered 2014-01-17 – 2014-01-31 (×15): 200 mg via ORAL
  Filled 2014-01-16 (×16): qty 1

## 2014-01-16 MED ORDER — ACETAMINOPHEN 500 MG PO TABS: 500.0000 mg | ORAL_TABLET | Freq: Four times a day (QID) | ORAL | Status: DC | PRN

## 2014-01-16 MED ORDER — ATORVASTATIN CALCIUM 10 MG PO TABS
10.0000 mg | ORAL_TABLET | Freq: Every day | ORAL | Status: DC
  Administered 2014-01-17 – 2014-01-31 (×15): 10 mg via ORAL
  Filled 2014-01-16 (×18): qty 1

## 2014-01-16 MED ORDER — FUROSEMIDE 40 MG PO TABS: 20.0000 mg | ORAL_TABLET | Freq: Every day | ORAL | Status: DC

## 2014-01-16 MED ORDER — CALCIUM POLYCARBOPHIL 625 MG PO TABS
625.0000 mg | ORAL_TABLET | Freq: Two times a day (BID) | ORAL | Status: DC
  Administered 2014-01-16 – 2014-01-21 (×10): 625 mg via ORAL
  Administered 2014-01-21: 10:00:00 via ORAL
  Administered 2014-01-22 – 2014-01-31 (×17): 625 mg via ORAL
  Filled 2014-01-16 (×33): qty 1

## 2014-01-16 MED ORDER — DABIGATRAN ETEXILATE MESYLATE 75 MG PO CAPS
75.0000 mg | ORAL_CAPSULE | Freq: Two times a day (BID) | ORAL | Status: DC
  Administered 2014-01-16 – 2014-01-31 (×30): 75 mg via ORAL
  Filled 2014-01-16 (×32): qty 1

## 2014-01-16 MED ORDER — FUROSEMIDE 40 MG PO TABS
40.0000 mg | ORAL_TABLET | Freq: Two times a day (BID) | ORAL | Status: DC
  Administered 2014-01-16: 40 mg via ORAL
  Filled 2014-01-16 (×4): qty 1

## 2014-01-16 MED ORDER — ALLOPURINOL 300 MG PO TABS
300.0000 mg | ORAL_TABLET | Freq: Every day | ORAL | Status: DC
  Administered 2014-01-17 – 2014-01-31 (×15): 300 mg via ORAL
  Filled 2014-01-16 (×16): qty 1

## 2014-01-16 MED ORDER — ZALEPLON 10 MG PO CAPS
10.0000 mg | ORAL_CAPSULE | Freq: Every day | ORAL | Status: DC
  Administered 2014-01-16 – 2014-01-30 (×15): 10 mg via ORAL
  Filled 2014-01-16 (×2): qty 1

## 2014-01-16 MED ORDER — METOPROLOL SUCCINATE 12.5 MG HALF TABLET
12.5000 mg | ORAL_TABLET | Freq: Every day | ORAL | Status: DC
  Administered 2014-01-17 – 2014-01-31 (×15): 12.5 mg via ORAL
  Filled 2014-01-16 (×16): qty 1

## 2014-01-16 NOTE — Progress Notes (Signed)
Physical Therapy Treatment Patient Details Name: Moody BruinsClarice V Boettcher MRN: [id number] DOB: 10-19-1924 Today's Date: 01/16/2014 Time: 1206-1230 PT Time Calculation (min): 24 min  PT Assessment / Plan / Recommendation  History of Present Illness Pt is 80 yo female with diastolic CHF, HTN, HLD, DM, MR and AS who [facility] with main concern of several days duration of progressively worsening shortness of breath, present with exertion and at rest, associated with progressive LE swelling, 3 pillow orthopnea, 10 lbs weight gain.   PT Comments   Pt with improved ability with gait today with second person for safety and chair. Pt continues to have posterior bias in sitting and standing limiting progression along with her fear of falling limiting progression. Pt encouraged to continue HEP and OOB daily. Family present throughout.  Follow Up Recommendations  CIR;Supervision/Assistance - 24 hour     Does the patient have the potential to tolerate intense rehabilitation     Barriers to Discharge        Equipment Recommendations       Recommendations for Other Services    Frequency     Progress towards PT Goals Progress towards PT goals: Progressing toward goals  Plan Current plan remains appropriate    Precautions / Restrictions Precautions Precautions: Fall Precaution Comments: Fearful of falling; most recent 2 weeks ago slid off edge of bed   Pertinent Vitals/Pain No pain    Mobility  Bed Mobility Overal bed mobility: Needs Assistance Rolling: Min assist Sidelying to sit: Mod assist General bed mobility comments: cueing for sequence with assist to roll and elevate trunk from flat HOB. Pt able to scoot to EOB with verbal cues only today Transfers Overall transfer level: Needs assistance Equipment used: 4-wheeled walker Transfers: Sit to/from Stand Sit to Stand: Mod assist General transfer comment: pt with mod cues and assist for anterior translation, hand placement and safety.  Pt with assist due to significant posterior bias and stood x 2 because when cueing and assist for anterior translation removed pt with posterior LOB and sittng back onto bed which was allowed to happen to cue pt for balance deficit and significant assist needed.  Ambulation/Gait Ambulation/Gait assistance: Mod assist;+2 safety/equipment Ambulation Distance (Feet): 16 Feet Assistive device: 4-wheeled walker Gait Pattern/deviations: Shuffle;Trunk flexed Gait velocity interpretation: <1.8 ft/sec, indicative of risk for recurrent falls General Gait Details: max cues for posture, looking up and increased step length. step length less than 1/2 her foot length. chair to follow and mod assist for anterior translation throughout    Exercises General Exercises - Lower Extremity Long Arc Quad: AROM;Seated;Both;20 reps Hip Flexion/Marching: AROM;Seated;Both;20 reps Toe Raises: AROM;Seated;Both;10 reps Heel Raises: AROM;Seated;Both;10 reps   PT Diagnosis:    PT Problem List:   PT Treatment Interventions:     PT Goals (current goals can now be found in the care plan section)    Visit Information  Last PT Received On: 01/16/14 Assistance Needed: +2 (safety with gait) History of Present Illness: Pt is 80 yo female with diastolic CHF, HTN, HLD, DM, MR and AS who presented [facility] ED with main concern of several days duration of progressively worsening shortness of breath, present with exertion and at rest, associated with progressive LE swelling, 3 pillow orthopnea, 10 lbs weight gain.    Subjective Data      Cognition  Cognition Arousal/Alertness: Awake/alert Behavior During Therapy: Flat affect Overall Cognitive Status: Impaired/Different from baseline Area of Impairment: Memory;Following commands Memory: Decreased short-term memory Following Commands: Follows one  step commands inconsistently;Follows one step commands with increased time General Comments: pt with baseline cognitive deficits but  dgtr present stating pt is having increased difficulty following commands recently    Balance  Balance Sitting balance-Leahy Scale: Poor Standing balance-Leahy Scale: Poor  End of Session PT - End of Session Equipment Utilized During Treatment: Gait belt Activity Tolerance: Patient limited by fatigue Patient left: in chair;with family/visitor present;with call bell/phone within reach Nurse Communication: Mobility status   GP     Delorse Lek 01/16/2014, 12:55 PM Delaney Meigs, PT [phone]

## 2014-01-16 NOTE — Interval H&P Note (Signed)
[name] was admitted today to Inpatient Rehabilitation with the diagnosis of deconditioning after multiple medical issues.  The patient's history has been reviewed, patient examined, and there is no change in status.  Patient continues to be appropriate for intensive inpatient rehabilitation.  I have reviewed the patient's chart and labs.  Questions were answered to the patient's satisfaction.  SWARTZ,ZACHARY T 01/16/2014, 8:50 PM

## 2014-01-16 NOTE — Progress Notes (Signed)
Rehab admissions - I met with pt and discussed the possibility of inpatient rehab. Pt is familiar with CIR and has been there twice before following knee surgery and a CVA. Pt is willing to come to inpt rehab and spoke by phone with Dr. Aileen Fass who gave medical clearance.  Bed is available today and will admit pt to inpatient rehab later today. Updated Camellia, case manager and Butch Penny, SW also aware of plan for CIR. RN also aware.  At end of my discussion, both pt's son and dtr came into room and were also made aware of the plan for admission to CIR later today. They were pleased with the plan. Questions answered.   Please call me with any questions.   Thanks.  Nanetta Batty, PT Rehabilitation Admissions Coordinator [phone]

## 2014-01-16 NOTE — Progress Notes (Signed)
Patient discharged to CIR.  [facility].  Saline locked IV access kept in place at receiving RN's request.  Patient's family at bedside throughout discharge process.  Patient and family voiced understanding of process.

## 2014-01-16 NOTE — Progress Notes (Signed)
[facility] PROGRESS NOTE Interim History: 80 yo female with diastolic CHF, HTN, HLD, DM, MR and AS who presented to [facility] ED with main concern of several days duration of progressively worsening shortness of breath, present with exertion and at rest, associated with progressive LE swelling, 3 pillow orthopnea, 10 lbs weight gain. She explains she was told to stop taking Lasix several days ago by PCP due to worsening blood work but she is not sure of the details. She denies chest pain, no specific abdominal or urinary concerns, no fevers, chills.  Filed Weights   01/15/14 0414 01/16/14 0417 01/16/14 0607  Weight: 74.3 kg (163 lb 12.8 oz) 73.528 kg (162 lb 1.6 oz) 73.392 kg (161 lb 12.8 oz)        Intake/Output Summary (Last 24 hours) at 01/16/14 0813 Last data filed at 01/16/14 0700  Gross per 24 hour  Intake   1203 ml  Output   2225 ml  Net  -1022 ml     Assessment/Plan: Acute systolic heart failure/  Aortic stenosis, moderate/Moderate mitral regurgitation: - Echo: as below. - due to on compliance. +JVD. - Cont lasix  ggt, replete electrolytes. Bp stable. - monitor weight, strict I and O's. - CIR once stable. - 78.2->74. Kg.  Enterococcus UTI:  - Resistant to cipro  - Given allergic reaction to [facility], will treat with fosfomycin (3G PO once)  - Patient denies dysuria and is afebrile   Hyponatremia:  - Due  CHF most likely  - Improving with diureses.  Acute on chronic renal failure (stage 3 at baseline):  - Due to UTI and decrease perfusion   HTN (hypertension) - Improved with diuresis.  Chronic atrial fibrillation: -continue b-blockers, amiodarone and pradaxa   Physical deconditioning:  - per PT recommendations will ask CIR to see; patient 24 care at home and able to be discharge with [facility] services after rehab  Protein caloric malnutrition: - ensure.  Code Status: Full  Family Communication: daughters at bedside  Disposition Plan: to be  detrmine   Consultants:  none  Procedures: ECHO [id number]:ejection fraction was in the range of 55% to 65%. Wall motion was normal; there were no regional wall motion Abnormalities. - Aortic valve: There was very mild stenosis. Valve area: 1.09cm^2(VTI). Valve area: 1.06cm^2 (Vmax). - Mitral valve: The findings are consistent with mild stenosis. Moderate regurgitation. Valve area by continuity equation (using LVOT flow): 1.18cm^2.   Antibiotics:  none  HPI/Subjective: No complains Relates her breathing is better  Objective: Filed Vitals:   01/15/14 2038 01/16/14 0153 01/16/14 0417 01/16/14 0607  BP: 111/68 122/55  115/62  Pulse: 85 72  70  Temp: 98.2 F (36.8 C) 97.9 F (36.6 C)  97.4 F (36.3 C)  TempSrc: Oral Oral  Oral  Resp: 18 17  18   Height:      Weight:   73.528 kg (162 lb 1.6 oz) 73.392 kg (161 lb 12.8 oz)  SpO2: 95% 97%  95%     Exam:  General: Alert, awake, oriented x3, in no acute distress.  HEENT: No bruits, no goiter. +JVD Heart: Regular rate and rhythm, no lower extremity edema. Lungs: Good air movement, clear  Abdomen: Soft, nontender, nondistended, positive bowel sounds.    Data Reviewed: Basic Metabolic Panel:  Recent Labs Lab 01/12/14 0455 01/13/14 0645 01/14/14 0340 01/15/14 0423 01/16/14 0605  NA 125* 128* 129* 133* 133*  K 4.9 4.4 3.5* 4.1 3.4*  CL 88* 89* 88* 92* 92*  CO2 25 26  25 29 29   GLUCOSE 123* 100* 112* 95 97  BUN 27* 28* 29* 29* 29*  CREATININE 1.05 1.07 1.04 1.17* 1.07  CALCIUM 8.8 8.8 8.2* 8.1* 8.2*   Liver Function Tests: No results found for this basename: AST, ALT, ALKPHOS, BILITOT, PROT, ALBUMIN,  in the last 168 hours No results found for this basename: LIPASE, AMYLASE,  in the last 168 hours No results found for this basename: AMMONIA,  in the last 168 hours CBC:  Recent Labs Lab 01/11/14 2206 01/12/14 0455  WBC 11.3* 11.0*  NEUTROABS 9.0*  --   HGB 12.8 11.5*  HCT 37.5 33.0*  MCV 97.4 95.9  PLT 173  168   Cardiac Enzymes:  Recent Labs Lab 01/11/14 2206  TROPONINI <0.30   BNP (last 3 results)  Recent Labs  04/08/13 1625 01/11/14 2206 01/12/14 0455  PROBNP 2363.0* 2822.0* 3584.0*   CBG:  Recent Labs Lab 01/12/14 0616 01/12/14 1120  GLUCAP 132* 184*    Recent Results (from the past 240 hour(s))  URINE CULTURE     Status: None   Collection Time    01/12/14  1:03 AM      Result Value Ref Range Status   Specimen Description URINE, CLEAN CATCH   Final   Special Requests NONE   Final   Culture  Setup Time     Final   Value: 01/12/2014 01:19     Performed at [facility]   Colony Count     Final   Value: 70,000 COLONIES/ML     [facility]formed at [facility]   Culture     Final   Value: ENTEROCOCCUS SPECIES     Performed at [facility]   Report Status 01/13/2014 FINAL   Final   Organism ID, Bacteria ENTEROCOCCUS SPECIES   Final     Studies: No results found.  Scheduled Meds: . allopurinol  300 mg Oral Daily  . amiodarone  200 mg Oral Daily  . atorvastatin  10 mg Oral Daily  . dabigatran  75 mg Oral Q12H  . gabapentin  300 mg Oral QHS  . levothyroxine  88 mcg Oral QAC breakfast  . metoprolol succinate  12.5 mg Oral Daily  . oxybutynin  10 mg Oral QHS  . polycarbophil  625 mg Oral BID  . sodium chloride  3 mL Intravenous Q12H  . sodium chloride  3 mL Intravenous Q12H  . zaleplon  10 mg Oral QHS   Continuous Infusions: . furosemide (LASIX) infusion 4 mg/hr (01/15/14 1023)     Radonna RickerFELIZ Rosine BeatTIZ, Kayelee Herbig  [facility] Pager [phone] If 8PM-8AM, please contact night-coverage at [email or web address], password [email or web address] 01/16/2014, 8:13 AM  LOS: 5 days

## 2014-01-16 NOTE — H&P (View-Only) (Signed)
Physical Medicine and Rehabilitation Admission H&P    Chief Complaint  Patient presents with  . Shortness of Breath  :  Chief complaint: Weakness  HPI: [name] is a 80 y.o. right-handed female with history of hypertension, mitral regurgitation and aortic stenosis/atrial fibrillation maintained on Pradaxa. Patient lives alone with 24-hour hired care. Presented 01/11/2014 with progressive shortness of breath and lower extremity edema as well as a 10 pound weight gain. Chest x-ray with evidence of volume overload. Noted sodium level 121 on admission. Patient was placed on intravenous Lasix. Echocardiogram with ejection fraction of 65% no wall motion abnormalities there is mild stenosis with aortic valve as well as moderate mitral regurgitation. Findings of enterococcus UTI and received one dose of fosfomycinl. Sodium level improving to 133. Physical therapy followup 01/13/2014 with recommendations for physical medicine rehabilitation consult. Patient was admitted for comprehensive rehabilitation program   ROS Review of Systems  Respiratory: Positive for shortness of breath.  Cardiovascular: Positive for palpitations and leg swelling.  Anginal pain  Gastrointestinal: Positive for constipation.  GERD  Neurological: Positive for headaches.  All other systems reviewed and are negative  Past Medical History  Diagnosis Date  . Hypertension   . Peripheral neuropathy   . Asthma   . High cholesterol   . Diabetes with neurologic complications   . PAD (peripheral artery disease)   . Enlarged heart   . Anginal pain 05/28/12  . Pneumonia 11/2010  . Sleep apnea     cpap  . Hypothyroidism   . Headache(784.0) 05/29/12    "recently"  . Stroke 05/2005; 08/2010    !mini"  . Personal history of gout   . Hypothyroidism   . DJD (degenerative joint disease)   . Carpal tunnel syndrome, bilateral   . Recurrent labyrinthitis   . GERD (gastroesophageal reflux disease)     S/P esophageal  dilation in 1996  . Gallstone     Silent  . Fibrocystic breast changes   . Hemorrhoids     with bleeding  . Hx of adenomatous colonic polyps     Dr Sammuel Cooper  . Diverticulitis     left side  . MR (mitral regurgitation)     moderate  . Mild anemia     hemoglobin 11.8 on 06/2009  . Edema of lower extremity     Chronic  . Ischemic colitis 8/13  . Aortic stenosis, moderate   . Chronic diastolic CHF (congestive heart failure)   . Moderate mitral regurgitation   . CKD (chronic kidney disease) stage 3, GFR 30-59 ml/min     "something on labs always say she has some problems"  . Chronic atrial fibrillation    Past Surgical History  Procedure Laterality Date  . Tonsillectomy and adenoidectomy  1960's  . Dilation and curettage of uterus    . Vaginal hysterectomy  1970's  . Total knee arthroplasty  1970-~2002    left; right  . Cataract extraction w/ intraocular lens  implant, bilateral  ?1970's   Family History  Problem Relation Age of Onset  . Heart disease Mother   . Heart disease Father   . Heart disease Brother    Social History:  reports that she has never smoked. She has never used smokeless tobacco. She reports that she does not drink alcohol or use illicit drugs. Allergies:  Allergies  Allergen Reactions  . Aspirin Other (See Comments)    Tears my stomach up; "can take coated Aspirin qd without problems"  .  Nexium [Esomeprazole Magnesium] Rash  . Penicillins Rash    Tolerates Rocephin   Medications Prior to Admission  Medication Sig Dispense Refill  . acetaminophen (TYLENOL) 500 MG tablet Take 500 mg by mouth every 6 (six) hours as needed for pain.      Marland Kitchen allopurinol (ZYLOPRIM) 300 MG tablet Take 300 mg by mouth daily.      Marland Kitchen amiodarone (PACERONE) 200 MG tablet Take 1 tablet (200 mg total) by mouth daily.  30 tablet  1  . atorvastatin (LIPITOR) 10 MG tablet Take 10 mg by mouth daily.      . Calcium Carbonate-Vitamin D (CALCIUM-VITAMIN D) 600-200 MG-UNIT CAPS Take 1  tablet by mouth daily.       . ciprofloxacin (CIPRO) 250 MG tablet Take 250 mg by mouth 2 (two) times daily.      . dabigatran (PRADAXA) 75 MG CAPS Take 75 mg by mouth every 12 (twelve) hours.      . gabapentin (NEURONTIN) 300 MG capsule Take 300 mg by mouth at bedtime.      . Garlic (GARLIQUE) 086 MG TBEC Take 400 mg by mouth daily.       Marland Kitchen Ketotifen Fumarate (ZADITOR OP) Place 1 drop into both eyes daily as needed (for dry eyes).      . L-methylfolate Calcium 7.5 MG TABS Take 1 tablet by mouth 2 (two) times daily.      . Lancets (FREESTYLE) lancets       . levothyroxine (SYNTHROID, LEVOTHROID) 88 MCG tablet Take 88 mcg by mouth daily.      . metoprolol succinate (TOPROL-XL) 25 MG 24 hr tablet Take 0.5 tablets (12.5 mg total) by mouth daily.  15 tablet  1  . Multiple Vitamins-Minerals (MULTIVITAMIN WITH MINERALS) tablet Take 1 tablet by mouth daily.      . Omega-3 Fatty Acids (FISH OIL) 1000 MG CAPS Take 1,000 mg by mouth daily.       . ONE TOUCH ULTRA TEST test strip       . oxybutynin (DITROPAN) 5 MG tablet Take 10 mg by mouth at bedtime.       . polycarbophil (FIBERCON) 625 MG tablet Take 625 mg by mouth 2 (two) times daily.      . potassium chloride (K-DUR,KLOR-CON) 10 MEQ tablet Take 10 mEq by mouth 2 (two) times daily.      . Probiotic Product (PROBIOTIC DAILY PO) Take 1 tablet by mouth daily.       . vitamin C (ASCORBIC ACID) 500 MG tablet Take 500 mg by mouth daily.      . zaleplon (SONATA) 5 MG capsule Take 10 mg by mouth at bedtime.         Home: Home Living Family/patient expects to be discharged to:: Private residence Living Arrangements: Alone Available Help at Discharge: Personal care attendant;Available 24 hours/day Type of Home: House Home Access: Stairs to enter [employer] of Steps: 2 Entrance Stairs-Rails: Right Home Layout: One level Home Equipment: Walker - 4 wheels;Grab bars - tub/shower;Grab bars - toilet   Functional History:    Functional  Status:  Mobility: mod assist for limited distances with PT     Ambulation/Gait Ambulation Distance (Feet): 5 Feet General Gait Details: pt walked 5' forward and initially started with increased steps then after 4' pt unable to advance feet, could not turn or back up and unable to state fatigue with dgtr able to bring chair behind pt to safely sit at surface with max cueing for  hand placement and assist to control descent    ADL:    Cognition: Cognition Overall Cognitive Status: Impaired/Different from baseline Cognition Arousal/Alertness: Awake/alert Behavior During Therapy: Flat affect Overall Cognitive Status: Impaired/Different from baseline Area of Impairment: Memory;Following commands Memory: Decreased short-term memory Following Commands: Follows one step commands inconsistently;Follows one step commands with increased time General Comments: pt with baseline cognitive deficits but dgtr present stating pt is having increased difficulty following commands recently  Physical Exam: Blood pressure 108/60, pulse 68, temperature 97.4 F (36.3 C), temperature source Oral, resp. rate 18, height _0  (1.702 m), weight 73.392 kg (161 lb 12.8 oz), SpO2 95.00%. Physical Exam Constitutional: She is oriented to person, place, and time. She appears well-developed. No distress HENT: oral mucosa pink and moist, dentition fair Head: Normocephalic.  Eyes: EOM are normal.  Neck: Normal range of motion. Neck supple. No thyromegaly present.  Cardiovascular:  Cardiac rate controlled, irreg/irreg with systolic murmur Respiratory: Effort normal and breath sounds normal. No respiratory distress. No wheezes, rales GI: Soft. Bowel sounds are normal. She exhibits no distension. Non-tender Neurological: She is alert and oriented to person, place, and time. CN exam normal. Strength UE: 4/5 deltoid bicep, tricep, HI are 4+, LE's 4/5 HF, KE and 4/5 at ankles. Stocking glove sensory loss to just above the  malleoli bilaterally. DTR's 1+. Cognitively displays fair insight and awareness. Doesn't initiate much Skin: Skin is warm and dry.  Psychiatric: She has a flat affect  Results for orders placed during the hospital encounter of 01/11/14 (from the past 48 hour(s))  BASIC METABOLIC PANEL     Status: Abnormal   Collection Time    01/15/14  4:23 AM      Result Value Ref Range   Sodium 133 (*) 137 - 147 mEq/L   Potassium 4.1  3.7 - 5.3 mEq/L   Chloride 92 (*) 96 - 112 mEq/L   CO2 29  19 - 32 mEq/L   Glucose, Bld 95  70 - 99 mg/dL   BUN 29 (*) 6 - 23 mg/dL   Creatinine, Ser 1.17 (*) 0.50 - 1.10 mg/dL   Calcium 8.1 (*) 8.4 - 10.5 mg/dL   GFR calc non Af Amer 41 (*) >90 mL/min   GFR calc Af Amer 47 (*) >90 mL/min   Comment: (NOTE)     The eGFR has been calculated using the CKD EPI equation.     This calculation has not been validated in all clinical situations.     eGFR's persistently <90 mL/min signify possible Chronic Kidney     Disease.  BASIC METABOLIC PANEL     Status: Abnormal   Collection Time    01/16/14  6:05 AM      Result Value Ref Range   Sodium 133 (*) 137 - 147 mEq/L   Potassium 3.4 (*) 3.7 - 5.3 mEq/L   Comment: DELTA CHECK NOTED   Chloride 92 (*) 96 - 112 mEq/L   CO2 29  19 - 32 mEq/L   Glucose, Bld 97  70 - 99 mg/dL   BUN 29 (*) 6 - 23 mg/dL   Creatinine, Ser 1.07  0.50 - 1.10 mg/dL   Calcium 8.2 (*) 8.4 - 10.5 mg/dL   GFR calc non Af Amer 45 (*) >90 mL/min   GFR calc Af Amer 53 (*) >90 mL/min   Comment: (NOTE)     The eGFR has been calculated using the CKD EPI equation.     This calculation has not been  validated in all clinical situations.     eGFR's persistently <90 mL/min signify possible Chronic Kidney     Disease.   No results found.  Post Admission Physician Evaluation: 1. Functional deficits secondary  to deconditioning after multiple medical. 2. Patient is admitted to receive collaborative, interdisciplinary care between the physiatrist, rehab nursing  staff, and therapy team. 3. Patient's level of medical complexity and substantial therapy needs in context of that medical necessity cannot be provided at a lesser intensity of care such as a SNF. 4. Patient has experienced substantial functional loss from his/her baseline which was documented above under the "Functional History" and "Functional Status" headings.  Judging by the patient's diagnosis, physical exam, and functional history, the patient has potential for functional progress which will result in measurable gains while on inpatient rehab.  These gains will be of substantial and practical use upon discharge  in facilitating mobility and self-care at the household level. 5. Physiatrist will provide 24 hour management of medical needs as well as oversight of the therapy plan/treatment and provide guidance as appropriate regarding the interaction of the two. 6. 24 hour rehab nursing will assist with bladder management, bowel management, safety, skin/wound care, disease management, medication administration, pain management and patient education  and help integrate therapy concepts, techniques,education, etc. 7. PT will assess and treat for/with: Lower extremity strength, range of motion, stamina, balance, functional mobility, safety, adaptive techniques and equipment, CV stamina, egosupport, education.   Goals are: supervision . 8. OT will assess and treat for/with: ADL's, functional mobility, safety, upper extremity strength, adaptive techniques and equipment, CV stamina, egosupport.   Goals are: supervision. 9. SLP will assess and treat for/with: n/a.  Goals are: n/a. 10. Case Management and Social Worker will assess and treat for psychological issues and discharge planning. 11. Team conference will be held weekly to assess progress toward goals and to determine barriers to discharge. 12. Patient will receive at least 3 hours of therapy per day at least 5 days per week. 13. ELOS: 10-14 days         14. Prognosis:  excellent   Medical Problem List and Plan: 1. Deconditioning secondary to exacerbation of CHF. Monitor for any signs of fluid overload  2. DVT Prophylaxis/Anticoagulation: SCDs. Monitor for any signs of DVT.  3. Pain Management: Neurontin 300 mg each bedtime 4. Neuropsych: This patient is capable of making decisions on her own behalf. 5. Aortic stenosis/atrial fibrillation. Continue Pradaxa as directed. Cardiac rate control. Amiodarone 200 mg daily, Toprol 12.5 mg daily and Lasix 40 mg twice a day 6. Hyponatremia. Latest sodium 133. Followup chemistries 7. Hypothyroidism. Synthroid  Meredith Staggers, MD, [facility]  01/16/2014

## 2014-01-16 NOTE — Progress Notes (Signed)
CSW spoke to Roderic PalauGenie Logue, RN [facility]. She stated that patient has been accepted to [facility] and that [place] [employer] [facility] will facilitate transfer.  CSW notified RNCM of above and will sign off. Will be available if needed. Thanks! Lorri Frederickonna T. West PughCrowder, LCSWA  [phone]

## 2014-01-16 NOTE — H&P (Signed)
  Physical Medicine and Rehabilitation Admission H&P    Chief Complaint  Patient presents with  . Shortness of Breath  :  Chief complaint: Weakness  HPI: [name] is a 80 y.o. right-handed female with history of hypertension, mitral regurgitation and aortic stenosis/atrial fibrillation maintained on Pradaxa. Patient lives alone with 24-hour hired care. Presented 01/11/2014 with progressive shortness of breath and lower extremity edema as well as a 10 pound weight gain. Chest x-ray with evidence of volume overload. Noted sodium level 121 on admission. Patient was placed on intravenous Lasix. Echocardiogram with ejection fraction of 65% no wall motion abnormalities there is mild stenosis with aortic valve as well as moderate mitral regurgitation. Findings of enterococcus UTI and received one dose of fosfomycinl. Sodium level improving to 133. Physical therapy followup 01/13/2014 with recommendations for physical medicine rehabilitation consult. Patient was admitted for comprehensive rehabilitation program   ROS Review of Systems  Respiratory: Positive for shortness of breath.  Cardiovascular: Positive for palpitations and leg swelling.  Anginal pain  Gastrointestinal: Positive for constipation.  GERD  Neurological: Positive for headaches.  All other systems reviewed and are negative  Past Medical History  Diagnosis Date  . Hypertension   . Peripheral neuropathy   . Asthma   . High cholesterol   . Diabetes with neurologic complications   . PAD (peripheral artery disease)   . Enlarged heart   . Anginal pain 05/28/12  . Pneumonia 11/2010  . Sleep apnea     cpap  . Hypothyroidism   . Headache(784.0) 05/29/12    "recently"  . Stroke 05/2005; 08/2010    !mini"  . Personal history of gout   . Hypothyroidism   . DJD (degenerative joint disease)   . Carpal tunnel syndrome, bilateral   . Recurrent labyrinthitis   . GERD (gastroesophageal reflux disease)     S/P esophageal  dilation in 1996  . Gallstone     Silent  . Fibrocystic breast changes   . Hemorrhoids     with bleeding  . Hx of adenomatous colonic polyps     Dr Weissman  . Diverticulitis     left side  . MR (mitral regurgitation)     moderate  . Mild anemia     hemoglobin 11.8 on 06/2009  . Edema of lower extremity     Chronic  . Ischemic colitis 8/13  . Aortic stenosis, moderate   . Chronic diastolic CHF (congestive heart failure)   . Moderate mitral regurgitation   . CKD (chronic kidney disease) stage 3, GFR 30-59 ml/min     "something on labs always say she has some problems"  . Chronic atrial fibrillation    Past Surgical History  Procedure Laterality Date  . Tonsillectomy and adenoidectomy  1960's  . Dilation and curettage of uterus    . Vaginal hysterectomy  1970's  . Total knee arthroplasty  1970-~2002    left; right  . Cataract extraction w/ intraocular lens  implant, bilateral  ?1970's   Family History  Problem Relation Age of Onset  . Heart disease Mother   . Heart disease Father   . Heart disease Brother    Social History:  reports that she has never smoked. She has never used smokeless tobacco. She reports that she does not drink alcohol or use illicit drugs. Allergies:  Allergies  Allergen Reactions  . Aspirin Other (See Comments)    Tears my stomach up; "can take coated Aspirin qd without problems"  .   Nexium [Esomeprazole Magnesium] Rash  . Penicillins Rash    Tolerates Rocephin   Medications Prior to Admission  Medication Sig Dispense Refill  . acetaminophen (TYLENOL) 500 MG tablet Take 500 mg by mouth every 6 (six) hours as needed for pain.      . allopurinol (ZYLOPRIM) 300 MG tablet Take 300 mg by mouth daily.      . amiodarone (PACERONE) 200 MG tablet Take 1 tablet (200 mg total) by mouth daily.  30 tablet  1  . atorvastatin (LIPITOR) 10 MG tablet Take 10 mg by mouth daily.      . Calcium Carbonate-Vitamin D (CALCIUM-VITAMIN D) 600-200 MG-UNIT CAPS Take 1  tablet by mouth daily.       . ciprofloxacin (CIPRO) 250 MG tablet Take 250 mg by mouth 2 (two) times daily.      . dabigatran (PRADAXA) 75 MG CAPS Take 75 mg by mouth every 12 (twelve) hours.      . gabapentin (NEURONTIN) 300 MG capsule Take 300 mg by mouth at bedtime.      . Garlic (GARLIQUE) 400 MG TBEC Take 400 mg by mouth daily.       . Ketotifen Fumarate (ZADITOR OP) Place 1 drop into both eyes daily as needed (for dry eyes).      . L-methylfolate Calcium 7.5 MG TABS Take 1 tablet by mouth 2 (two) times daily.      . Lancets (FREESTYLE) lancets       . levothyroxine (SYNTHROID, LEVOTHROID) 88 MCG tablet Take 88 mcg by mouth daily.      . metoprolol succinate (TOPROL-XL) 25 MG 24 hr tablet Take 0.5 tablets (12.5 mg total) by mouth daily.  15 tablet  1  . Multiple Vitamins-Minerals (MULTIVITAMIN WITH MINERALS) tablet Take 1 tablet by mouth daily.      . Omega-3 Fatty Acids (FISH OIL) 1000 MG CAPS Take 1,000 mg by mouth daily.       . ONE TOUCH ULTRA TEST test strip       . oxybutynin (DITROPAN) 5 MG tablet Take 10 mg by mouth at bedtime.       . polycarbophil (FIBERCON) 625 MG tablet Take 625 mg by mouth 2 (two) times daily.      . potassium chloride (K-DUR,KLOR-CON) 10 MEQ tablet Take 10 mEq by mouth 2 (two) times daily.      . Probiotic Product (PROBIOTIC DAILY PO) Take 1 tablet by mouth daily.       . vitamin C (ASCORBIC ACID) 500 MG tablet Take 500 mg by mouth daily.      . zaleplon (SONATA) 5 MG capsule Take 10 mg by mouth at bedtime.         Home: Home Living Family/patient expects to be discharged to:: Private residence Living Arrangements: Alone Available Help at Discharge: Personal care attendant;Available 24 hours/day Type of Home: House Home Access: Stairs to enter Entrance Stairs-Number of Steps: 2 Entrance Stairs-Rails: Right Home Layout: One level Home Equipment: Walker - 4 wheels;Grab bars - tub/shower;Grab bars - toilet   Functional History:    Functional  Status:  Mobility: mod assist for limited distances with PT     Ambulation/Gait Ambulation Distance (Feet): 5 Feet General Gait Details: pt walked 5' forward and initially started with increased steps then after 4' pt unable to advance feet, could not turn or back up and unable to state fatigue with dgtr able to bring chair behind pt to safely sit at surface with max cueing for   hand placement and assist to control descent    ADL:    Cognition: Cognition Overall Cognitive Status: Impaired/Different from baseline Cognition Arousal/Alertness: Awake/alert Behavior During Therapy: Flat affect Overall Cognitive Status: Impaired/Different from baseline Area of Impairment: Memory;Following commands Memory: Decreased short-term memory Following Commands: Follows one step commands inconsistently;Follows one step commands with increased time General Comments: pt with baseline cognitive deficits but dgtr present stating pt is having increased difficulty following commands recently  Physical Exam: Blood pressure 108/60, pulse 68, temperature 97.4 F (36.3 C), temperature source Oral, resp. rate 18, height 5' 7" (1.702 m), weight 73.392 kg (161 lb 12.8 oz), SpO2 95.00%. Physical Exam Constitutional: She is oriented to person, place, and time. She appears well-developed. No distress HENT: oral mucosa pink and moist, dentition fair Head: Normocephalic.  Eyes: EOM are normal.  Neck: Normal range of motion. Neck supple. No thyromegaly present.  Cardiovascular:  Cardiac rate controlled, irreg/irreg with systolic murmur Respiratory: Effort normal and breath sounds normal. No respiratory distress. No wheezes, rales GI: Soft. Bowel sounds are normal. She exhibits no distension. Non-tender Neurological: She is alert and oriented to person, place, and time. CN exam normal. Strength UE: 4/5 deltoid bicep, tricep, HI are 4+, LE's 4/5 HF, KE and 4/5 at ankles. Stocking glove sensory loss to just above the  malleoli bilaterally. DTR's 1+. Cognitively displays fair insight and awareness. Doesn't initiate much Skin: Skin is warm and dry.  Psychiatric: She has a flat affect  Results for orders placed during the hospital encounter of 01/11/14 (from the past 48 hour(s))  BASIC METABOLIC PANEL     Status: Abnormal   Collection Time    01/15/14  4:23 AM      Result Value Ref Range   Sodium 133 (*) 137 - 147 mEq/L   Potassium 4.1  3.7 - 5.3 mEq/L   Chloride 92 (*) 96 - 112 mEq/L   CO2 29  19 - 32 mEq/L   Glucose, Bld 95  70 - 99 mg/dL   BUN 29 (*) 6 - 23 mg/dL   Creatinine, Ser 1.17 (*) 0.50 - 1.10 mg/dL   Calcium 8.1 (*) 8.4 - 10.5 mg/dL   GFR calc non Af Amer 41 (*) >90 mL/min   GFR calc Af Amer 47 (*) >90 mL/min   Comment: (NOTE)     The eGFR has been calculated using the CKD EPI equation.     This calculation has not been validated in all clinical situations.     eGFR's persistently <90 mL/min signify possible Chronic Kidney     Disease.  BASIC METABOLIC PANEL     Status: Abnormal   Collection Time    01/16/14  6:05 AM      Result Value Ref Range   Sodium 133 (*) 137 - 147 mEq/L   Potassium 3.4 (*) 3.7 - 5.3 mEq/L   Comment: DELTA CHECK NOTED   Chloride 92 (*) 96 - 112 mEq/L   CO2 29  19 - 32 mEq/L   Glucose, Bld 97  70 - 99 mg/dL   BUN 29 (*) 6 - 23 mg/dL   Creatinine, Ser 1.07  0.50 - 1.10 mg/dL   Calcium 8.2 (*) 8.4 - 10.5 mg/dL   GFR calc non Af Amer 45 (*) >90 mL/min   GFR calc Af Amer 53 (*) >90 mL/min   Comment: (NOTE)     The eGFR has been calculated using the CKD EPI equation.     This calculation has not been   validated in all clinical situations.     eGFR's persistently <90 mL/min signify possible Chronic Kidney     Disease.   No results found.  Post Admission Physician Evaluation: 1. Functional deficits secondary  to deconditioning after multiple medical. 2. Patient is admitted to receive collaborative, interdisciplinary care between the physiatrist, rehab nursing  staff, and therapy team. 3. Patient's level of medical complexity and substantial therapy needs in context of that medical necessity cannot be provided at a lesser intensity of care such as a SNF. 4. Patient has experienced substantial functional loss from his/her baseline which was documented above under the "Functional History" and "Functional Status" headings.  Judging by the patient's diagnosis, physical exam, and functional history, the patient has potential for functional progress which will result in measurable gains while on inpatient rehab.  These gains will be of substantial and practical use upon discharge  in facilitating mobility and self-care at the household level. 5. Physiatrist will provide 24 hour management of medical needs as well as oversight of the therapy plan/treatment and provide guidance as appropriate regarding the interaction of the two. 6. 24 hour rehab nursing will assist with bladder management, bowel management, safety, skin/wound care, disease management, medication administration, pain management and patient education  and help integrate therapy concepts, techniques,education, etc. 7. PT will assess and treat for/with: Lower extremity strength, range of motion, stamina, balance, functional mobility, safety, adaptive techniques and equipment, CV stamina, egosupport, education.   Goals are: supervision . 8. OT will assess and treat for/with: ADL's, functional mobility, safety, upper extremity strength, adaptive techniques and equipment, CV stamina, egosupport.   Goals are: supervision. 9. SLP will assess and treat for/with: n/a.  Goals are: n/a. 10. Case Management and Social Worker will assess and treat for psychological issues and discharge planning. 11. Team conference will be held weekly to assess progress toward goals and to determine barriers to discharge. 12. Patient will receive at least 3 hours of therapy per day at least 5 days per week. 13. ELOS: 10-14 days         14. Prognosis:  excellent   Medical Problem List and Plan: 1. Deconditioning secondary to exacerbation of CHF. Monitor for any signs of fluid overload  2. DVT Prophylaxis/Anticoagulation: SCDs. Monitor for any signs of DVT.  3. Pain Management: Neurontin 300 mg each bedtime 4. Neuropsych: This patient is capable of making decisions on her own behalf. 5. Aortic stenosis/atrial fibrillation. Continue Pradaxa as directed. Cardiac rate control. Amiodarone 200 mg daily, Toprol 12.5 mg daily and Lasix 40 mg twice a day 6. Hyponatremia. Latest sodium 133. Followup chemistries 7. Hypothyroidism. Synthroid  Zachary T. Swartz, MD, FAAPMR [facility]  01/16/2014 

## 2014-01-16 NOTE — Discharge Summary (Signed)
Physician Discharge Summary  [name]:[id number] DOB: 1924-08-14 DOA: 01/11/2014  PCP: Ginette Otto, MD  Admit date: 01/11/2014 Discharge date: 01/16/2014  Time spent: 40 minutes  Recommendations for Outpatient Follow-up:  1. Follow up with cardiology in 2 week. 2. Will go to Inpatient Rehab.  BNP    Component Value Date/Time   PROBNP 3584.0* 01/12/2014 0455   Filed Weights   01/15/14 0414 01/16/14 0417 01/16/14 8119  Weight: 74.3 kg (163 lb 12.8 oz) 73.528 kg (162 lb 1.6 oz) 73.392 kg (161 lb 12.8 oz)     Discharge Diagnoses:  Principal Problem:   Acute systolic heart failure Active Problems:   Chronic atrial fibrillation   HTN (hypertension)   Aortic stenosis, moderate   Moderate mitral regurgitation   Discharge Condition: stable  Diet recommendation: low sodium diet    History of present illness:  80 yo female with diastolic CHF, HTN, HLD, DM, MR and AS who presented to [facility] ED with main concern of several days duration of progressively worsening shortness of breath, present with exertion and at rest, associated with progressive LE swelling, 3 pillow orthopnea, 10 lbs weight gain. She explains she was told to stop taking Lasix several days ago by PCP due to worsening blood work but she is not sure of the details. She denies chest pain, no specific abdominal or urinary concerns, no fevers, chills.    Hospital Course:  Acute systolic heart failure/ Aortic stenosis, moderate/Moderate mitral regurgitation: - Echo: as below.  - due to on compliance. +JVD with lower extremity edema on admsision. - Started on lasix ggt, replete electrolytes. Bp stable.  - monitor weight, strict I and O's.  - CIR once stable.  - 78.2->70. Kg. Will cont lasix orally twice a day for 4 days, then daily.  Enterococcus UTI:  - Resistant to cipro  - Given allergic reaction to [facility], will treat with fosfomycin (3G PO once)  - Patient denies dysuria and is afebrile    Hyponatremia:  - Due CHF most likely  - Improving with diureses.   Acute on chronic renal failure (stage 3 at baseline):  - Due to UTI and decrease perfusion   HTN (hypertension)  - Improved with diuresis.   Chronic atrial fibrillation:  -continue b-blockers, amiodarone and pradaxa   Physical deconditioning:  - per PT recommendations will ask CIR to see; patient 24 care at home and able to be discharge with [facility] services after rehab   Protein caloric malnutrition:  - ensure.   Code Status: Full  Family Communication: daughters at bedside  Disposition Plan: to be detrmine  Consultants:  none Procedures:  ECHO [id number]:ejection fraction was in the range of 55% to 65%. Wall motion was normal; there were no regional wall motion Abnormalities. - Aortic valve: There was very mild stenosis. Valve area: 1.09cm^2(VTI). Valve area: 1.06cm^2 (Vmax). - Mitral valve: The findings are consistent with mild stenosis. Moderate regurgitation. Valve area by continuity equation (using LVOT flow): 1.18cm^2.    Discharge Exam: Filed Vitals:   01/16/14 0607  BP: 115/62  Pulse: 70  Temp: 97.4 F (36.3 C)  Resp: 18    General: A&O x3 Cardiovascular: RRR Respiratory: good air movement CTA B/L  Discharge Instructions       Future Appointments Provider Department Dept Phone   04/02/2014 2:00 PM Marlowe Aschoff, DPM [facility] [phone]   05/18/2014 2:30 PM Ronal Fear, NP [facility] [phone]       Medication  List         acetaminophen 500 MG tablet  Commonly known as:  TYLENOL  Take 500 mg by mouth every 6 (six) hours as needed for pain.     allopurinol 300 MG tablet  Commonly known as:  ZYLOPRIM  Take 300 mg by mouth daily.     amiodarone 200 MG tablet  Commonly known as:  PACERONE  Take 1 tablet (200 mg total) by mouth daily.     atorvastatin 10 MG tablet  Commonly known as:  LIPITOR  Take 10 mg by mouth daily.      Calcium-Vitamin D 600-200 MG-UNIT Caps  Take 1 tablet by mouth daily.     ciprofloxacin 250 MG tablet  Commonly known as:  CIPRO  Take 250 mg by mouth 2 (two) times daily.     dabigatran 75 MG Caps capsule  Commonly known as:  PRADAXA  Take 75 mg by mouth every 12 (twelve) hours.     Fish Oil 1000 MG Caps  Take 1,000 mg by mouth daily.     freestyle lancets     furosemide 40 MG tablet  Commonly known as:  LASIX  Take 1 tablet (40 mg total) by mouth 2 (two) times daily.     furosemide 40 MG tablet  Commonly known as:  LASIX  Take 0.5 tablets (20 mg total) by mouth daily.  Start taking on:  01/19/2014     gabapentin 300 MG capsule  Commonly known as:  NEURONTIN  Take 300 mg by mouth at bedtime.     GARLIQUE 400 MG Tbec  Generic drug:  Garlic  Take 400 mg by mouth daily.     L-methylfolate Calcium 7.5 MG Tabs  Take 1 tablet by mouth 2 (two) times daily.     levothyroxine 88 MCG tablet  Commonly known as:  SYNTHROID, LEVOTHROID  Take 88 mcg by mouth daily.     metoprolol succinate 25 MG 24 hr tablet  Commonly known as:  TOPROL-XL  Take 0.5 tablets (12.5 mg total) by mouth daily.     multivitamin with minerals tablet  Take 1 tablet by mouth daily.     ONE TOUCH ULTRA TEST test strip  Generic drug:  glucose blood     oxybutynin 5 MG tablet  Commonly known as:  DITROPAN  Take 10 mg by mouth at bedtime.     polycarbophil 625 MG tablet  Commonly known as:  FIBERCON  Take 625 mg by mouth 2 (two) times daily.     potassium chloride 10 MEQ tablet  Commonly known as:  K-DUR,KLOR-CON  Take 10 mEq by mouth 2 (two) times daily.     PROBIOTIC DAILY PO  Take 1 tablet by mouth daily.     vitamin C 500 MG tablet  Commonly known as:  ASCORBIC ACID  Take 500 mg by mouth daily.     ZADITOR OP  Place 1 drop into both eyes daily as needed (for dry eyes).     zaleplon 5 MG capsule  Commonly known as:  SONATA  Take 10 mg by mouth at bedtime.       Allergies    Allergen Reactions  . Aspirin Other (See Comments)    Tears my stomach up; "can take coated Aspirin qd without problems"  . Nexium [Esomeprazole Magnesium] Rash  . Penicillins Rash    Tolerates Rocephin   Follow-up Information   Follow up with [facility].   Contact information:  [address] [place] [address] [phone]        The results of significant diagnostics from this hospitalization (including imaging, microbiology, ancillary and laboratory) are listed below for reference.    Significant Diagnostic Studies: Dg Chest 2 View  01/05/2014   CLINICAL DATA:  Chronic shortness of breath.  Hypertension.  EXAM: CHEST  2 VIEW  COMPARISON:  04/08/2013  FINDINGS: There is large been of the cardiopericardial silhouette. The aorta is uncoiled. Pulmonary arteries are mildly prominent. No mediastinal or hilar masses or adenopathy.  Mild lung base reticular opacity, likely scarring. Lungs are otherwise clear. No pleural effusion or pneumothorax.  The bony thorax shows a mild compression fracture of L1 but is otherwise intact.  IMPRESSION: No acute cardiopulmonary disease.   Electronically Signed   By: Amie Portland M.D.   On: 01/05/2014 16:27   Dg Chest Port 1 View  01/11/2014   CLINICAL DATA:  Shortness of Breath  EXAM: PORTABLE CHEST - 1 VIEW  COMPARISON:  January 05, 2014  FINDINGS: There is no edema or consolidation. Heart is enlarged with mild pulmonary venous hypertension. No adenopathy. No bone lesions.  IMPRESSION: Evidence of volume overload with cardiomegaly and pulmonary venous hypertension. No overt edema or consolidation, however. No appreciable effusions.   Electronically Signed   By: Bretta Bang M.D.   On: 01/11/2014 21:58    Microbiology: Recent Results (from the past 240 hour(s))  URINE CULTURE     Status: None   Collection Time    01/12/14  1:03 AM      Result Value Ref Range Status   Specimen Description URINE, CLEAN CATCH   Final    Special Requests NONE   Final   Culture  Setup Time     Final   Value: 01/12/2014 01:19     Performed at [facility] Count     Final   Value: 70,000 COLONIES/ML     Performed at [facility]   Culture     Final   Value: ENTEROCOCCUS SPECIES     Performed at [facility]   Report Status 01/13/2014 FINAL   Final   Organism ID, Bacteria ENTEROCOCCUS SPECIES   Final     Labs: Basic Metabolic Panel:  Recent Labs Lab 01/12/14 0455 01/13/14 0645 01/14/14 0340 01/15/14 0423 01/16/14 0605  NA 125* 128* 129* 133* 133*  K 4.9 4.4 3.5* 4.1 3.4*  CL 88* 89* 88* 92* 92*  CO2 25 26 25 29 29   GLUCOSE 123* 100* 112* 95 97  BUN 27* 28* 29* 29* 29*  CREATININE 1.05 1.07 1.04 1.17* 1.07  CALCIUM 8.8 8.8 8.2* 8.1* 8.2*   Liver Function Tests: No results found for this basename: AST, ALT, ALKPHOS, BILITOT, PROT, ALBUMIN,  in the last 168 hours No results found for this basename: LIPASE, AMYLASE,  in the last 168 hours No results found for this basename: AMMONIA,  in the last 168 hours CBC:  Recent Labs Lab 01/11/14 2206 01/12/14 0455  WBC 11.3* 11.0*  NEUTROABS 9.0*  --   HGB 12.8 11.5*  HCT 37.5 33.0*  MCV 97.4 95.9  PLT 173 168   Cardiac Enzymes:  Recent Labs Lab 01/11/14 2206  TROPONINI <0.30   BNP: BNP (last 3 results)  Recent Labs  04/08/13 1625 01/11/14 2206 01/12/14 0455  PROBNP 2363.0* 2822.0* 3584.0*   CBG:  Recent Labs Lab 01/12/14 0616 01/12/14 1120  GLUCAP 132* 184*  Signed:  Marinda Elk  [facility] 01/16/2014, 8:43 AM

## 2014-01-16 NOTE — PMR Pre-admission (Signed)
PMR Admission Coordinator Pre-Admission Assessment  Patient: [name] Chance is an 80 y.o., female MRN: [id number] DOB: 11-15-1923 Height: 5\' 7"  (170.2 cm) Weight: 73.392 kg (161 lb 12.8 oz) (bed scale)              Insurance Information HMO:     PPO:      PCP:      IPA:      80/20:      OTHER:  PRIMARY: Medicare A & B      Policy#: [id number]      Subscriber: self CM Name:       Phone#:      Fax#:  Pre-Cert#: verified in Facilities manageralmetto      Employer: retired Benefits:  Phone #:      Name:  Eff. Date: 07-31-91     Deduct: $1260      Out of Pocket Max: none     Life Max: unlimited   CIR: 100%      SNF: 100% days 1-20; 80% days 21-100 (100 day visit max) Outpatient: 80%     Co-Pay: 20% Home Health: 100%       Co-Pay: none, no visit limits DME: 80%     Co-Pay: 20% Providers: pt's preference  SECONDARY: [employer]      Policy#: [id number]      Subscriber: self CM Name:       Phone#:      Fax#:  Pre-Cert#:       Employer:  Benefits:  Phone #: [phone]     Name:   Emergency Contact Information Contact Information   Name Relation Home Work [place]   Sellers,Teresa Daughter [phone]   Edwards,[name] Daughter   [phone]   Lawana PaiOsborne,Don Son   [phone]     Current Medical History  Patient Admitting Diagnosis: Deconditioning secondary to exacerbation of CHF  History of Present Illness: [name] Sneath is a 80 y.o. right-handed female with history of hypertension, mitral regurgitation and aortic stenosis/atrial fibrillation maintained on Pradaxa. Patient lives alone with 24-hour hired care. Presented 01/11/2014 with progressive shortness of breath and lower extremity edema as well as a 10 pound weight gain. Chest x-ray with evidence of volume overload. Noted sodium level 121 on admission. Patient was placed on intravenous Lasix. Echocardiogram with ejection fraction of 65% no wall motion abnormalities there is mild stenosis with aortic valve as well as  moderate mitral regurgitation. Findings of enterococcus UTI and received one dose of Monurol. Sodium level improving to 129. Physical therapy followup 01/13/2014 with recommendations for physical medicine rehabilitation consult.  Past Medical History  Past Medical History  Diagnosis Date  . Hypertension   . Peripheral neuropathy   . Asthma   . High cholesterol   . Diabetes with neurologic complications   . PAD (peripheral artery disease)   . Enlarged heart   . Anginal pain 05/28/12  . Pneumonia 11/2010  . Sleep apnea     cpap  . Hypothyroidism   . Headache(784.0) 05/29/12    "recently"  . Stroke 05/2005; 08/2010    !mini"  . Personal history of gout   . Hypothyroidism   . DJD (degenerative joint disease)   . Carpal tunnel syndrome, bilateral   . Recurrent labyrinthitis   . GERD (gastroesophageal reflux disease)     S/P esophageal dilation in 1996  . Gallstone     Silent  . Fibrocystic breast changes   . Hemorrhoids     with bleeding  . Hx  of adenomatous colonic polyps     Dr Sherin Quarry  . Diverticulitis     left side  . MR (mitral regurgitation)     moderate  . Mild anemia     hemoglobin 11.8 on 06/2009  . Edema of lower extremity     Chronic  . Ischemic colitis 8/13  . Aortic stenosis, moderate   . Chronic diastolic CHF (congestive heart failure)   . Moderate mitral regurgitation   . CKD (chronic kidney disease) stage 3, GFR 30-59 ml/min     "something on labs always say she has some problems"  . Chronic atrial fibrillation     Family History  family history includes Heart disease in her brother, father, and mother.  Prior Rehab/Hospitalizations: has been to CIR twice before following knee sx and CVA. Also has had outpt PT for general strengthening and home health.  Current Medications  Current facility-administered medications:0.9 %  sodium chloride infusion, 250 mL, Intravenous, PRN, Dorothea Ogle, MD, Last Rate: 10 mL/hr at 01/14/14 0938, 250 mL at 01/14/14  1610;  acetaminophen (TYLENOL) tablet 500 mg, 500 mg, Oral, Q6H PRN, Dorothea Ogle, MD;  allopurinol (ZYLOPRIM) tablet 300 mg, 300 mg, Oral, Daily, Dorothea Ogle, MD, 300 mg at 01/16/14 1120 amiodarone (PACERONE) tablet 200 mg, 200 mg, Oral, Daily, Dorothea Ogle, MD, 200 mg at 01/16/14 1120;  atorvastatin (LIPITOR) tablet 10 mg, 10 mg, Oral, Daily, Dorothea Ogle, MD, 10 mg at 01/16/14 1120;  dabigatran (PRADAXA) capsule 75 mg, 75 mg, Oral, Q12H, Dorothea Ogle, MD, 75 mg at 01/16/14 1121;  furosemide (LASIX) tablet 40 mg, 40 mg, Oral, BID, Marinda Elk, MD, 40 mg at 01/16/14 1121 gabapentin (NEURONTIN) capsule 300 mg, 300 mg, Oral, QHS, Dorothea Ogle, MD, 300 mg at 01/15/14 2111;  levothyroxine (SYNTHROID, LEVOTHROID) tablet 88 mcg, 88 mcg, Oral, QAC breakfast, Dorothea Ogle, MD, 88 mcg at 01/16/14 0551;  metoprolol succinate (TOPROL-XL) 24 hr tablet 12.5 mg, 12.5 mg, Oral, Daily, Dorothea Ogle, MD, 12.5 mg at 01/16/14 1120;  ondansetron (ZOFRAN) injection 4 mg, 4 mg, Intravenous, Q6H PRN, Dorothea Ogle, MD ondansetron [facility]) tablet 4 mg, 4 mg, Oral, Q6H PRN, Dorothea Ogle, MD;  oxybutynin [facility]) tablet 10 mg, 10 mg, Oral, QHS, Dorothea Ogle, MD, 10 mg at 01/15/14 2111;  polycarbophil (FIBERCON) tablet 625 mg, 625 mg, Oral, BID, Dorothea Ogle, MD, 625 mg at 01/16/14 1120;  sodium chloride 0.9 % injection 3 mL, 3 mL, Intravenous, Q12H, Dorothea Ogle, MD, 3 mL at 01/15/14 2112 sodium chloride 0.9 % injection 3 mL, 3 mL, Intravenous, Q12H, Dorothea Ogle, MD, 3 mL at 01/16/14 1121;  sodium chloride 0.9 % injection 3 mL, 3 mL, Intravenous, PRN, Dorothea Ogle, MD;  zaleplon (SONATA) capsule 10 mg, 10 mg, Oral, QHS, Marinda Elk, MD  Patients Current Diet: heart healthy/carb modified  Precautions / Restrictions Precautions Precautions: Fall Precaution Comments: Fearful of falling; most recent 2 weeks ago slid off edge of bed Restrictions Weight Bearing Restrictions: No   Prior Activity  Level Limited Community (1-2x/wk): goes out w/ aides or dtr on limited errands. Does go to beauty shop every Wed at 2:00.  Home Assistive Devices / Equipment Home Assistive Devices/Equipment: CPAP;Walker (specify type) Home Equipment: Walker - 4 wheels;Grab bars - tub/shower;Grab bars - toilet  Prior Functional Level Prior Function Level of Independence: Needs assistance ADL's / Homemaking Assistance Needed: assist for shower  Current Functional Level Cognition  Overall Cognitive Status: Impaired/Different from baseline Following Commands: Follows one step commands inconsistently;Follows one step commands with increased time General Comments: pt with baseline cognitive deficits but dgtr present stating pt is having increased difficulty following commands recently    Extremity Assessment (includes Sensation/Coordination)          ADLs  To be assessed    Mobility  Overal bed mobility: Needs Assistance Bed Mobility: Supine to Sit Rolling: Min assist Sidelying to sit: Mod assist Supine to sit: Min assist General bed mobility comments: cueing for sequence with assist to roll and elevate trunk from flat HOB. Pt able to scoot to EOB with verbal cues only today    Transfers  Overall transfer level: Needs assistance Equipment used: 4-wheeled walker Transfers: Sit to/from Stand Sit to Stand: Mod assist Stand pivot transfers: Mod assist General transfer comment: pt with mod cues and assist for anterior translation, hand placement and safety. Pt with assist due to significant posterior bias and stood x 2 because when cueing and assist for anterior translation removed pt with posterior LOB and sittng back onto bed which was allowed to happen to cue pt for balance deficit and significant assist needed.     Ambulation / Gait / Stairs / Wheelchair Mobility  Ambulation/Gait Ambulation/Gait assistance: Mod assist;+2 safety/equipment Ambulation Distance (Feet): 16 Feet Assistive device:  4-wheeled walker Gait Pattern/deviations: Shuffle;Trunk flexed Gait velocity interpretation: <1.8 ft/sec, indicative of risk for recurrent falls General Gait Details: max cues for posture, looking up and increased step length. step length less than 1/2 her foot length. chair to follow and mod assist for anterior translation throughout    Posture / Balance Dynamic Sitting Balance Sitting balance - Comments: posterior lean at edge of bed without loss of balance    Special needs/care consideration BiPAP/CPAP - has her CPAP from home CPM no  Continuous Drip IV no  Dialysis no         Life Vest no  Oxygen no Special Bed no  Trach Size no  Wound Vac (area) no       Skin no issues                               Bowel mgmt: last BM on 01-15-14 Bladder mgmt: some incontinence, using bed pan Diabetic mgmt - per RN, hx of DM but diet controlled    Previous Home Environment Living Arrangements: Alone Available Help at Discharge: Personal care attendant;Available 24 hours/day Type of Home: House Home Layout: One level Home Access: Stairs to enter Entrance Stairs-Rails: Right Entrance Stairs-Number of Steps: 2 Home Care Services: Yes Type of Home Care Services: Homehealth aide Home Care Agency (if known): private  Discharge Living Setting Plans for Discharge Living Setting: Patient's home Type of Home at Discharge: House Discharge Home Layout: One level Discharge Home Access: Stairs to enter Entrance Stairs-Rails: Right Entrance Stairs-Number of Steps: 2 Does the patient have any problems obtaining your medications?: No  Social/Family/Support Systems Contact Information: daughter [name] is primary conact Anticipated Caregiver: pt has 24 hr aide support Anticipated Caregiver's Contact Information: see above Ability/Limitations of Caregiver: aides do provide 24 hr assist and can do light assistance Caregiver Availability: 24/7 Discharge Plan Discussed with Primary Caregiver: Yes  (discussed with son/dtr and pt who confirmed 24 hr aide suppo) Is Caregiver In Agreement with Plan?: Yes Does Caregiver/Family have Issues with Lodging/Transportation while Pt is in Rehab?: No  Goals/Additional Needs Patient/Family Goal  for Rehab: supervision with PT/OT and NA with SLP Expected length of stay: 10-12 days Cultural Considerations: [employer] Dietary Needs: heart healthy/carb modified Equipment Needs: to be determined Additional Information: per son, pt does not respond well to Ambien and has requested that no Ambien be given to pt Pt/Family Agrees to Admission and willing to participate: Yes (spoke with pt, son and dtr in person 3-20) Program Orientation Provided & Reviewed with Pt/Caregiver Including Roles  & Responsibilities: Yes  Decrease burden of Care through IP rehab admission: NA   Possible need for SNF placement upon discharge: not anticipated   Patient Condition: This patient's medical and functional status has changed since the consult dated: 01-14-14 in which the Rehabilitation Physician determined and documented that the patient's condition is appropriate for intensive rehabilitative care in an inpatient rehabilitation facility. See "History of Present Illness" (above) for medical update. Functional changes are: moderate assist to ambulate 16' +2 safety/equipment. Patient's medical and functional status update has been discussed with the Rehabilitation physician and patient remains appropriate for inpatient rehabilitation. Will admit to inpatient rehab today.  Preadmission Screen Completed By:  Juliann Mule, PT 01/16/2014 1:54 PM ______________________________________________________________________   Discussed status with Dr. Riley Kill on 01-16-14 at 1358 and received telephone approval for admission today.  Admission Coordinator:  Campbell Lerner Lateria Alderman,PT time 1358/Date3-20-15

## 2014-01-17 ENCOUNTER — Inpatient Hospital Stay (HOSPITAL_COMMUNITY): Payer: Medicare Other | Admitting: Physical Therapy

## 2014-01-17 ENCOUNTER — Inpatient Hospital Stay (HOSPITAL_COMMUNITY): Payer: Medicare Other | Admitting: Occupational Therapy

## 2014-01-17 DIAGNOSIS — R5381 Other malaise: Secondary | ICD-10-CM

## 2014-01-17 DIAGNOSIS — I509 Heart failure, unspecified: Secondary | ICD-10-CM

## 2014-01-17 MED ORDER — FUROSEMIDE 20 MG PO TABS
20.0000 mg | ORAL_TABLET | Freq: Two times a day (BID) | ORAL | Status: DC
  Filled 2014-01-17 (×2): qty 1

## 2014-01-17 MED ORDER — FUROSEMIDE 20 MG PO TABS
20.0000 mg | ORAL_TABLET | Freq: Two times a day (BID) | ORAL | Status: AC
  Administered 2014-01-17 – 2014-01-19 (×6): 20 mg via ORAL
  Filled 2014-01-17 (×6): qty 1

## 2014-01-17 NOTE — Evaluation (Signed)
Occupational Therapy Assessment and Plan  Patient Details  Name: [name] MRN: [id number] Date of Birth: 05/10/1924  OT Diagnosis: cognitive deficits and muscle weakness (generalized) Rehab Potential: Rehab Potential: Good ELOS: 10-14 days   Today's Date: 01/17/2014 Time: 1275-1700 Time Calculation (min): 60 min  Problem List:  Patient Active Problem List   Diagnosis Date Noted  . Acute systolic heart failure 17/49/4496  . CHF (congestive heart failure) 01/12/2014  . ASD (atrial septal defect)   . Aortic stenosis, moderate   . Moderate mitral regurgitation   . HTN (hypertension) 10/27/2013  . Aortic valve disorders 10/27/2013  . Gout 10/02/2013  . Abnormality of gait 04/17/2013  . Abnormal PFT 01/28/2013  . Diabetes with neurologic complications   . Physical deconditioning 06/06/2012  . OSA (obstructive sleep apnea) 06/04/2012  . Chronic atrial fibrillation   . Diastolic heart failure, NYHA class 1   . Hypothyroidism   . Asthma     Past Medical History:  Past Medical History  Diagnosis Date  . Hypertension   . Peripheral neuropathy   . Asthma   . High cholesterol   . Diabetes with neurologic complications   . PAD (peripheral artery disease)   . Enlarged heart   . Anginal pain 05/28/12  . Pneumonia 11/2010  . Sleep apnea     cpap  . Hypothyroidism   . Headache(784.0) 05/29/12    "recently"  . Stroke 05/2005; 08/2010    !mini"  . Personal history of gout   . Hypothyroidism   . DJD (degenerative joint disease)   . Carpal tunnel syndrome, bilateral   . Recurrent labyrinthitis   . GERD (gastroesophageal reflux disease)     S/P esophageal dilation in 1996  . Gallstone     Silent  . Fibrocystic breast changes   . Hemorrhoids     with bleeding  . Hx of adenomatous colonic polyps     Dr Sammuel Cooper  . Diverticulitis     left side  . MR (mitral regurgitation)     moderate  . Mild anemia     hemoglobin 11.8 on 06/2009  . Edema of lower extremity      Chronic  . Ischemic colitis 8/13  . Aortic stenosis, moderate   . Chronic diastolic CHF (congestive heart failure)   . Moderate mitral regurgitation   . CKD (chronic kidney disease) stage 3, GFR 30-59 ml/min     "something on labs always say she has some problems"  . Chronic atrial fibrillation    Past Surgical History:  Past Surgical History  Procedure Laterality Date  . Tonsillectomy and adenoidectomy  1960's  . Dilation and curettage of uterus    . Vaginal hysterectomy  1970's  . Total knee arthroplasty  1970-~2002    left; right  . Cataract extraction w/ intraocular lens  implant, bilateral  ?1970's    Assessment & Plan Clinical Impression: Patient is a 80 y.o. right-handed female with history of hypertension, mitral regurgitation and aortic stenosis/atrial fibrillation maintained on Pradaxa. Patient lives alone with 24-hour hired care. Presented 01/11/2014 with progressive shortness of breath and lower extremity edema as well as a 10 pound weight gain. Chest x-ray with evidence of volume overload. Noted sodium level 121 on admission. Patient was placed on intravenous Lasix. Echocardiogram with ejection fraction of 65% no wall motion abnormalities there is mild stenosis with aortic valve as well as moderate mitral regurgitation. Findings of enterococcus UTI and received one dose of fosfomycinl. Sodium level improving to  133.   Patient transferred to CIR on 01/16/2014 .   Patient currently requires minimum assistance to total assistance with basic self-care skills secondary to muscle weakness and decreased problem solving, decreased safety awareness and decreased memory.  Prior to hospitalization, patient could complete BADLs with independent .  Patient will benefit from skilled intervention to decrease level of assist with basic self-care skills, increase independence with basic self-care skills and decrease caregiver burden for planned discharge home with 24 hour supervision.  Anticipate patient will require 24 hour supervision and follow up home health.  OT - End of Session Activity Tolerance: Tolerates 30+ min activity with multiple rests Endurance Deficit: Yes OT Assessment Rehab Potential: Good OT Patient demonstrates impairments in the following area(s): Balance;Cognition;Endurance;Safety OT Basic ADL's Functional Problem(s): Grooming;Bathing;Dressing;Toileting OT Transfers Functional Problem(s): Toilet;Tub/Shower OT Plan OT Intensity: Minimum of 1-2 x/day, 45 to 90 minutes OT Frequency: Total of 15 hours over 7 days OT Duration/Estimated Length of Stay: 10-14 days OT Treatment/Interventions: Medical illustrator training;Community reintegration;Discharge planning;Patient/family education;Functional mobility training;Self Care/advanced ADL retraining;Therapeutic Activities;Therapeutic Exercise;UE/LE Strength taining/ROM;Wheelchair propulsion/positioning   Skilled Therapeutic Intervention Patient seen for initial ADL FIM evaluation and treatment this morning for occupational therapy.  Patient supine in bed upon therapist arrival.  Patient transfers supine to EOB with moderate assistance for trunk management.  Patient completes bathing seated EOB to wash 7/10 body parts.  Patient and therapist attempt to stand and patient unable to achieve a full upright standing position with rollator positioned in front of her with BUE support.  Patient returns to supine and is able to use BUEs and BLEs to move up in bed with verbal cues for technique and moderate assistance.  Patient able to wash peri area in supine position and requires assistance to wash buttocks in sidelying.  Patient rolls with supervision right and left in bed with use of rails.  Patient completes bathing overall with minimum assistance to wash buttocks and R lower leg/foot.  Patient completes grooming with setup at EOB.  Patient requires multiple rest breaks throughout session and is educated on pursed lip  breathing techniques and energy conservation techniques for improved performance with ADLs.  Tolerated morning session well.    OT Evaluation Precautions/Restrictions  Precautions Precautions: Fall Precaution Comments: hx of falls Restrictions Weight Bearing Restrictions: No Other Position/Activity Restrictions: hx of B TKR General Chart Reviewed: Yes Missed Time Reason: Patient fatigue;Nursing care Pain Pain Assessment Pain Assessment: No/denies pain Home Living/Prior Functioning Home Living Available Help at Discharge: Personal care attendant;Available 24 hours/day Type of Home: House Home Access: Stairs to enter [employer] of Steps: 2 Entrance Stairs-Rails: Right Home Layout: One level  Lives With: Alone IADL History Homemaking Responsibilities: No IADL Comments: Pt has hired Public librarian who assist with cooking and cleaning. Prior Function Level of Independence: Independent with basic ADLs;Independent with gait;Independent with transfers;Requires assistive device for independence  Able to Take Stairs?: Yes Vocation: Retired Vision/Perception  Vision - History Baseline Vision: Wears glasses all the time Patient Visual Report: No change from baseline Vision - Assessment Eye Alignment: Within Functional Limits Vision Assessment: Vision not tested Additional Comments: WFL for functional ADL tasks today; formal vision screen not completed  Cognition Overall Cognitive Status: Impaired/Different from baseline Arousal/Alertness: Awake/alert Orientation Level: Oriented X4 Memory: Impaired Memory Impairment: Decreased recall of new information;Decreased short term memory Safety/Judgment: Impaired Sensation Sensation Light Touch: Appears Intact Motor  Motor Motor: Within Functional Limits Mobility  Bed Mobility Bed Mobility: Supine to Sit;Sit to Supine Supine to Sit: 5: Supervision;HOB  flat;With rails Sit to Supine: 5: Supervision;HOB flat;With  rail Transfers Sit to Stand: 3: Mod assist Stand to Sit: 3: Mod assist  Trunk/Postural Assessment  Cervical Assessment Cervical Assessment: Within Functional Limits Thoracic Assessment Thoracic Assessment: Within Functional Limits Lumbar Assessment Lumbar Assessment: Within Functional Limits Postural Control Postural Control: Within Functional Limits  Balance Balance Balance Assessed: Yes Static Sitting Balance Static Sitting - Balance Support: Feet supported Static Sitting - Level of Assistance: 5: Stand by assistance Static Standing Balance Static Standing - Balance Support: Bilateral upper extremity supported Static Standing - Level of Assistance: 4: Min assist Dynamic Standing Balance Dynamic Standing - Balance Support: During functional activity Dynamic Standing - Level of Assistance: 4: Min assist Extremity/Trunk Assessment RUE Assessment RUE Assessment: Within Functional Limits LUE Assessment LUE Assessment: Within Functional Limits  FIM:  FIM - Grooming Grooming Steps: Wash, rinse, dry face;Wash, rinse, dry hands;Brush, comb hair Grooming: 5: Set-up assist to obtain items FIM - Bathing Bathing Steps Patient Completed: Chest;Right Arm;Left Arm;Abdomen;Front perineal area;Right upper leg;Left upper leg;Left lower leg (including foot) Bathing: 4: Min-Patient completes 8-9 [id number] parts or 75+ percent FIM - Upper Body Dressing/Undressing Upper body dressing/undressing steps patient completed: Thread/unthread right bra strap;Thread/unthread left bra strap;Hook/unhook bra;Thread/unthread right sleeve of pullover shirt/dresss;Thread/unthread left sleeve of pullover shirt/dress;Put head through opening of pull over shirt/dress;Pull shirt over trunk Upper body dressing/undressing: 4: Min-Patient completed 75 plus % of tasks FIM - Lower Body Dressing/Undressing Lower body dressing/undressing steps patient completed: Don/Doff left sock Lower body dressing/undressing: 1:  Total-Patient completed less than 25% of tasks FIM - Toileting Toileting: 0: Activity did not occur FIM - BControl and instrumentation engineerDevices: Bed rails Bed/Chair Transfer: 3: Bed > Chair or W/C: Mod A (lift or lower assist);3: Chair or W/C > Bed: Mod A (lift or lower assist) FIM - TAir cabin crewTransfers: 0-Activity did not occur FIM - TCamera operatorTransfers: 0-Activity did not occur or was simulated   Refer to Care Plan for Long Term Goals  Recommendations for other services: None  Discharge Criteria: Patient will be discharged from OT if patient refuses treatment 3 consecutive times without medical reason, if treatment goals not met, if there is a change in medical status, if patient makes no progress towards goals or if patient is discharged from hospital.  The above assessment, treatment plan, treatment alternatives and goals were discussed and mutually agreed upon: by patient and by family.  Daughter present for afternoon session and OT purpose/POC discussed with patient and daughter.  BOsa Craver3/21/2015, 3:24 PM

## 2014-01-17 NOTE — Progress Notes (Signed)
Physical Therapy Session Note  Patient Details  Name: [name] MRN: [id number] Date of Birth: Oct 21, 1924  Today's Date: 01/17/2014 Time: 1255-1330 Time Calculation (min): 35 min  Short Term Goals: Week 1:  PT Short Term Goal 1 (Week 1): Mod I for bed mobility with rail PT Short Term Goal 2 (Week 1): bed <> chair transfers min A PT Short Term Goal 3 (Week 1): ambulation with rolling walker ~ 150 feet with min A PT Short Term Goal 4 (Week 1): ascend/descend 2 stairs with R rail and min A  Skilled Therapeutic Interventions/Progress Updates:  Pt propelled w/c with B LEs about 50 feet with min A and max encouragement. Pt transferred sit to stand multiple times with rolling walker and min to mod A with verbal cues for safety. Pt ambulated about 75 feet with 4WW and min A with verbal cues for safety and sequencing. Pt had increased difficulty with turns and transitioning from one surface to another.   Therapy Documentation Precautions:  Precautions Precautions: Fall Precaution Comments: hx of falls Restrictions Weight Bearing Restrictions: No Other Position/Activity Restrictions: hx of B TKR General: Chart Reviewed: Yes Amount of Missed PT Time (min): 10 Minutes Missed Time Reason: Patient fatigue;Nursing care Family/Caregiver Present: Yes  Pain: No c/o pain.     See FIM for current functional status  Therapy/Group: Individual Therapy  Rayford HalstedMitchell, Ivy Puryear G 01/17/2014, 3:03 PM

## 2014-01-17 NOTE — Progress Notes (Addendum)
[facility]ILIT[facility]     PROGRESS NOTE    Subjective/Complaints: Slept well denies pain. No sob, cough, used CPAP.  A 12 point review of systems has been performed and if not noted above is otherwise negative.   Objective: Vital Signs: Blood pressure 98/48, pulse 80, temperature 98.5 F (36.9 C), temperature source Oral, resp. rate 16, weight 73.2 kg (161 lb 6 oz), SpO2 97.00%. No results found. No results found for this basename: WBC, HGB, HCT, PLT,  in the last 72 hours  Recent Labs  01/16/14 0605 01/16/14 1800  NA 133* 133*  K 3.4* 3.7  CL 92* 91*  GLUCOSE 97 164*  BUN 29* 31*  CREATININE 1.07 1.44*  CALCIUM 8.2* 8.1*   CBG (last 3)  No results found for this basename: GLUCAP,  in the last 72 hours  Wt Readings from Last 3 Encounters:  01/17/14 73.2 kg (161 lb 6 oz)  01/16/14 73.392 kg (161 lb 12.8 oz)  11/17/13 71.668 kg (158 lb)    Physical Exam:  Constitutional: She is oriented to person, place, and time. She appears well-developed. No distress  HENT: oral mucosa pink and moist, dentition fair  Head: Normocephalic.  Eyes: EOM are normal.  Neck: Normal range of motion. Neck supple. No thyromegaly present.  Cardiovascular:  Cardiac rate controlled, irreg/irreg with systolic murmur  Respiratory: Effort normal and breath sounds normal. No respiratory distress. No wheezes, rales  GI: Soft. Bowel sounds are normal. She exhibits no distension. Non-tender Neurological: She is alert and oriented to person, place, and time. CN exam normal. Strength UE: 4/5 deltoid bicep, tricep, HI are 4+, LE's 4/5 HF, KE and 4/5 at ankles. Stocking glove sensory loss to just above the malleoli bilaterally. DTR's 1+. Cognitively displays fair insight and awareness. Doesn't initiate much  Skin: Skin is warm and dry.  Psychiatric: She has a flat affect   Assessment/Plan: 1. Functional deficits secondary to deconditioning after multiple medical issues which  require 3+ hours per day of interdisciplinary therapy in a comprehensive inpatient rehab setting. Physiatrist is providing close team supervision and 24 hour management of active medical problems listed below. Physiatrist and rehab team continue to assess barriers to discharge/monitor patient progress toward functional and medical goals. FIM:                   Comprehension Comprehension Mode: Auditory Comprehension: 5-Follows basic conversation/direction: With no assist  Expression Expression Mode: Verbal Expression: 5-Expresses basic needs/ideas: With no assist  Social Interaction Social Interaction: 5-Interacts appropriately 90% of the time - Needs monitoring or encouragement for participation or interaction.  Problem Solving Problem Solving: 5-Solves basic problems: With no assist  Memory Memory: 5-Recognizes or recalls 90% of the time/requires cueing < 10% of the time Medical Problem List and Plan:  1. Deconditioning secondary to exacerbation of CHF. Monitor for any signs of fluid overload  2. DVT Prophylaxis/Anticoagulation: SCDs. Monitor for any signs of DVT.  3. Pain Management: Neurontin 300 mg each bedtime  4. Neuropsych: This patient is capable of making decisions on her own behalf.  5. Aortic stenosis/atrial fibrillation. Continue Pradaxa as directed. Cardiac rate control. Amiodarone 200 mg daily, Toprol 12.5 mg daily and Lasix 40 mg twice a day---decrease to 20mg  bid as bp's low and more renal insufficiency on labs.               - recheck labs tomorrow  -relax fluid restriction to 1500 6. Hyponatremia. Latest sodium 133. Followup  chemistries tomorrow 7. Hypothyroidism. Synthroid   LOS (Days) 1 A FACE TO FACE EVALUATION WAS PERFORMED  Elishua Radford T 01/17/2014 9:18 AM

## 2014-01-17 NOTE — Evaluation (Signed)
Physical Therapy Assessment and Plan  Patient Details  Name: [name] MRN: [id number] Date of Birth: [birth year]  PT Diagnosis: Difficulty walking and Muscle weakness Rehab Potential: Good ELOS: 10 to 14 days   Today's Date: 01/17/2014 Time: 9628-3662 Time Calculation (min): 45 min  Problem List:  Patient Active Problem List   Diagnosis Date Noted  . Acute systolic heart failure 94/76/5465  . CHF (congestive heart failure) 01/12/2014  . ASD (atrial septal defect)   . Aortic stenosis, moderate   . Moderate mitral regurgitation   . HTN (hypertension) 10/27/2013  . Aortic valve disorders 10/27/2013  . Gout 10/02/2013  . Abnormality of gait 04/17/2013  . Abnormal PFT 01/28/2013  . Diabetes with neurologic complications   . Physical deconditioning 06/06/2012  . OSA (obstructive sleep apnea) 06/04/2012  . Chronic atrial fibrillation   . Diastolic heart failure, NYHA class 1   . Hypothyroidism   . Asthma     Past Medical History:  Past Medical History  Diagnosis Date  . Hypertension   . Peripheral neuropathy   . Asthma   . High cholesterol   . Diabetes with neurologic complications   . PAD (peripheral artery disease)   . Enlarged heart   . Anginal pain 05/28/12  . Pneumonia 11/2010  . Sleep apnea     cpap  . Hypothyroidism   . Headache(784.0) 05/29/12    "recently"  . Stroke 05/2005; 08/2010    !mini"  . Personal history of gout   . Hypothyroidism   . DJD (degenerative joint disease)   . Carpal tunnel syndrome, bilateral   . Recurrent labyrinthitis   . GERD (gastroesophageal reflux disease)     S/P esophageal dilation in 1996  . Gallstone     Silent  . Fibrocystic breast changes   . Hemorrhoids     with bleeding  . Hx of adenomatous colonic polyps     Dr Sammuel Cooper  . Diverticulitis     left side  . MR (mitral regurgitation)     moderate  . Mild anemia     hemoglobin 11.8 on 06/2009  . Edema of lower extremity     Chronic  . Ischemic colitis 8/13   . Aortic stenosis, moderate   . Chronic diastolic CHF (congestive heart failure)   . Moderate mitral regurgitation   . CKD (chronic kidney disease) stage 3, GFR 30-59 ml/min     "something on labs always say she has some problems"  . Chronic atrial fibrillation    Past Surgical History:  Past Surgical History  Procedure Laterality Date  . Tonsillectomy and adenoidectomy  1960's  . Dilation and curettage of uterus    . Vaginal hysterectomy  1970's  . Total knee arthroplasty  1970-~2002    left; right  . Cataract extraction w/ intraocular lens  implant, bilateral  ?1970's    Assessment & Plan Clinical Impression: Patient is a 80 y.o. right-handed female with history of hypertension, mitral regurgitation and aortic stenosis/atrial fibrillation maintained on Pradaxa. Patient lives alone with 24-hour hired care. Presented 01/11/2014 with progressive shortness of breath and lower extremity edema as well as a 10 pound weight gain. Chest x-ray with evidence of volume overload. Noted sodium level 121 on admission. Patient was placed on intravenous Lasix. Echocardiogram with ejection fraction of 65% no wall motion abnormalities there is mild stenosis with aortic valve as well as moderate mitral regurgitation. Findings of enterococcus UTI and received one dose of fosfomycinl. Sodium level improving to 133.  Patient transferred to CIR on 01/16/2014 .   Patient currently requires mod with mobility secondary to muscle weakness and decreased cardiorespiratoy endurance.  Prior to hospitalization, patient was modified independent  with mobility and lived with Alone in a House home.  Home access is 2Stairs to enter.  Patient will benefit from skilled PT intervention to maximize safe functional mobility, minimize fall risk and decrease caregiver burden for planned discharge home with 24 hour supervision.  Anticipate patient will benefit from follow up [place] at discharge.   PT - End of Session Activity  Tolerance: Tolerates 30+ min activity with multiple rests Endurance Deficit: Yes PT Assessment Rehab Potential: Good Barriers to Discharge: Inaccessible home environment PT Patient demonstrates impairments in the following area(s): Balance;Motor;Safety PT Transfers Functional Problem(s): Bed Mobility;Bed to Chair;Car PT Locomotion Functional Problem(s): Ambulation;Stairs PT Plan PT Intensity: Minimum of 1-2 x/day ,45 to 90 minutes PT Frequency: 5 out of 7 days PT Duration Estimated Length of Stay: 10 to 14 days PT Treatment/Interventions: Training and development officer;Ambulation/gait training;Functional mobility training;Patient/family education;Therapeutic Exercise;UE/LE Coordination activities;Therapeutic Activities;UE/LE Strength taining/ROM;Stair training;Neuromuscular re-education PT Transfers Anticipated Outcome(s): S for transfers PT Locomotion Anticipated Outcome(s): S for ambulation and stairs PT Recommendation Follow Up Recommendations: Home health PT Patient destination: Home Equipment Recommended: To be determined  Skilled Therapeutic Intervention PT evaluation completed and treatment plan initiated. Pt ambulated with 4WW and min A for 125 feet, decreased step length B, decreased cadence, and decreased step height B occasionally catches toes during swing through. Performed multiple sit to stand transfers with min to mod A, requires increased assistance with decreased seat height.   PT Evaluation Precautions/Restrictions Precautions Precautions: Fall Precaution Comments: hx of falls Restrictions Weight Bearing Restrictions: No Other Position/Activity Restrictions: hx of B TKR General Chart Reviewed: Yes Family/Caregiver Present: Yes Pain No c/o pain.    Home Living/Prior Functioning Home Living Available Help at Discharge: Personal care attendant;Available 24 hours/day Type of Home: House Home Access: Stairs to enter [employer] of Steps: 2 Entrance  Stairs-Rails: Right Home Layout: One level  Lives With: Alone Prior Function Level of Independence: Independent with transfers;Independent with gait;Requires assistive device for independence  Able to Take Stairs?: Yes Vision/Perception  Vision - History Baseline Vision: Wears glasses all the time Patient Visual Report: No change from baseline Vision - Assessment Vision Assessment: Vision not tested  Cognition Overall Cognitive Status: Impaired/Different from baseline Orientation Level: Other (comment) (generally oriented) Memory: Impaired Memory Impairment: Decreased recall of new information;Decreased short term memory Safety/Judgment: Impaired Sensation Sensation Light Touch: Appears Intact Motor  Motor Motor: Within Functional Limits  Mobility Bed Mobility Bed Mobility: Supine to Sit;Sit to Supine Supine to Sit: 5: Supervision;HOB flat;With rails Sit to Supine: 5: Supervision;HOB flat;With rail Transfers Transfers: Yes Sit to Stand: 3: Mod assist Stand to Sit: 3: Mod assist Stand Pivot Transfers: 3: Mod assist Locomotion  Ambulation Ambulation/Gait Assistance: 4: Min assist Ambulation Distance (Feet): 50 Feet Stairs / Additional Locomotion Stairs: Yes Stairs Assistance: 3: Mod assist Stair Management Technique: Two rails Number of Stairs: 2 Wheelchair Mobility Wheelchair Mobility: Yes Wheelchair Assistance: 3: Mod Lexicographer: Both lower extermities  Trunk/Postural Assessment  Cervical Assessment Cervical Assessment: Within Functional Limits Thoracic Assessment Thoracic Assessment: Within Functional Limits Lumbar Assessment Lumbar Assessment: Within Functional Limits Postural Control Postural Control: Within Functional Limits  Balance Balance Balance Assessed: Yes Static Sitting Balance Static Sitting - Balance Support: Feet supported Static Sitting - Level of Assistance: 5: Stand by assistance Static Standing Balance Static Standing  -  Balance Support: Bilateral upper extremity supported Static Standing - Level of Assistance: 4: Min assist Dynamic Standing Balance Dynamic Standing - Balance Support: During functional activity Dynamic Standing - Level of Assistance: 4: Min assist Extremity Assessment B UEs as per OT evaluation.   RLE Assessment RLE Assessment: Exceptions to [facility] RLE AROM (degrees) Overall AROM Right Lower Extremity: Within functional limits for tasks assessed RLE Strength RLE Overall Strength: Deficits RLE Overall Strength Comments: grossly 3-/5 to 3/5 throughout LLE Assessment LLE Assessment: Exceptions to WFL LLE AROM (degrees) Overall AROM Left Lower Extremity: Within functional limits for tasks assessed LLE Strength LLE Overall Strength: Deficits LLE Overall Strength Comments: grossly 3-/5 to 3/5 throughout  FIM:  FIM - Bed/Chair Transfer Bed/Chair Transfer Assistive Devices: Bed rails (head of bed flat) Bed/Chair Transfer: 5: Supine > Sit: Supervision (verbal cues/safety issues);3: Bed > Chair or W/C: Mod A (lift or lower assist);3: Chair or W/C > Bed: Mod A (lift or lower assist) FIM - Locomotion: Wheelchair Locomotion: Wheelchair: 1: Travels less than 50 ft with moderate assistance (Pt: 50 - 74%) FIM - Locomotion: Ambulation Locomotion: Ambulation Assistive Devices: Other (comment) [phone]) Ambulation/Gait Assistance: 4: Min assist Locomotion: Ambulation: 2: Travels 50 - 149 ft with minimal assistance (Pt.>75%) FIM - Locomotion: Stairs Locomotion: Scientist, physiological: Hand rail - 2 Locomotion: Stairs: 1: Up and Down < 4 stairs with moderate assistance (Pt: 50 - 74%)   Refer to Care Plan for Long Term Goals  Recommendations for other services: None  Discharge Criteria: Patient will be discharged from PT if patient refuses treatment 3 consecutive times without medical reason, if treatment goals not met, if there is a change in medical status, if patient makes no progress towards  goals or if patient is discharged from hospital.  The above assessment, treatment plan, treatment alternatives and goals were discussed and mutually agreed upon: by patient and by family  Dub Amis 01/17/2014, 2:46 PM

## 2014-01-17 NOTE — Progress Notes (Signed)
Occupational Therapy Session Note  Patient Details  Name: [name] MRN: [id number] Date of Birth: [birth year]  Today's Date: 01/17/2014 Time: 1400-1445 Time Calculation (min): 45 min  Short Term Goals: Week 1:  OT Short Term Goal 1 (Week 1): Patient will complete grooming at the sink with modified independence. OT Short Term Goal 2 (Week 1): Patient will transfers wheelchair to/from toilet via stand pivot with minimum assistance. OT Short Term Goal 3 (Week 1): Patient will complete shower transfer with minimum assistance. OT Short Term Goal 4 (Week 1): Patient will complete lower body dressing with minimum assistance. OT Short Term Goal 5 (Week 1): Patient will complete upper body dressing with setup.  Skilled Therapeutic Interventions/Progress Updates:  Patient seen this afternoon for occupational therapy with emphasis on functional mobility, activity tolerance, standing tolerance, standing balance, and postural control.  Patient self propels wheelchair using BUEs and BLEs from hospital room to therapy gym for topographical orientation task to locate therapy gym.  Patient able to maneuver wheelchair with minimum assistance and max verbal cues for technique.  Patient completes wheelchair to mat transfer via lateral scoot with moderate to minimum assistance.  Patient completes sit to/from stand from various mat heights with moderate to minimum assistance, stand balance minimum assistance to SBA, and stand tolerance 5-7 minutes per standing trial while completing 2 handed laundry folding task and dynamic standing balance horse shoe tossing task.  Patient uses RW in front of her for sitting to standing.  Increased anxiety and fear of falling noted during session and patient agreed.  Daughter present for session and also agreed.  Patient completes standing to sit in wheelchair transfer with minimum assistance and moderate verbal cues for body positioning and technique.  Poor motor planning vs  increased anxiety noted for transfers.  Patient with improved performance and tolerance this afternoon and would benefit from continued IR.  Therapy Documentation Precautions:  Precautions Precautions: Fall Precaution Comments: hx of falls Restrictions Weight Bearing Restrictions: No Other Position/Activity Restrictions: hx of B TKR General: General Chart Reviewed: Yes Missed Time Reason: Patient fatigue;Nursing care Pain: Pain Assessment Pain Assessment: No/denies pain  See FIM for current functional status  Therapy/Group: Individual Therapy  Eber HongBrown, Parrie Rasco L 01/17/2014, 3:49 PM

## 2014-01-18 ENCOUNTER — Inpatient Hospital Stay (HOSPITAL_COMMUNITY): Payer: Medicare Other

## 2014-01-18 LAB — BASIC METABOLIC PANEL
BUN: 30 mg/dL — ABNORMAL HIGH (ref 6–23)
CHLORIDE: 91 meq/L — AB (ref 96–112)
CO2: 34 meq/L — AB (ref 19–32)
Calcium: 8.7 mg/dL (ref 8.4–10.5)
Creatinine, Ser: 1.21 mg/dL — ABNORMAL HIGH (ref 0.50–1.10)
GFR calc non Af Amer: 39 mL/min — ABNORMAL LOW (ref >90)
GFR, EST AFRICAN AMERICAN: 45 mL/min — AB (ref >90)
Glucose, Bld: 89 mg/dL (ref 70–99)
POTASSIUM: 4.5 meq/L (ref 3.7–5.3)
Sodium: 133 mEq/L — ABNORMAL LOW (ref 137–147)

## 2014-01-18 NOTE — Progress Notes (Signed)
[facility] MEDICINE & REHABILITATION     PROGRESS NOTE    Subjective/Complaints: No new issues. Good night of sleep again. Tolerated therapies yesterday without any major issues. A 12 point review of systems has been performed and if not noted above is otherwise negative.   Objective: Vital Signs: Blood pressure 118/73, pulse 79, temperature 98.3 F (36.8 C), temperature source Oral, resp. rate 18, height 5' 6.93" (1.7 m), weight 66.4 kg (146 lb 6.2 oz), SpO2 95.00%. No results found. No results found for this basename: WBC, HGB, HCT, PLT,  in the last 72 hours  Recent Labs  01/16/14 1800 01/18/14 0600  NA 133* 133*  K 3.7 4.5  CL 91* 91*  GLUCOSE 164* 89  BUN 31* 30*  CREATININE 1.44* 1.21*  CALCIUM 8.1* 8.7   CBG (last 3)  No results found for this basename: GLUCAP,  in the last 72 hours  Wt Readings from Last 3 Encounters:  01/18/14 66.4 kg (146 lb 6.2 oz)  01/16/14 73.392 kg (161 lb 12.8 oz)  11/17/13 71.668 kg (158 lb)    Physical Exam:  Constitutional: She is oriented to person, place, and time. She appears well-developed. No distress  HENT: oral mucosa pink and moist, dentition fair  Head: Normocephalic.  Eyes: EOM are normal.  Neck: Normal range of motion. Neck supple. No thyromegaly present.  Cardiovascular:  Cardiac rate controlled, irreg/irreg with systolic murmur  Respiratory: Effort normal and breath sounds normal. No respiratory distress. No wheezes, rales. Using cpap GI: Soft. Bowel sounds are normal. She exhibits no distension. Non-tender Neurological: She is alert and oriented to person, place, and time. CN exam normal. Strength UE: 4/5 deltoid bicep, tricep, HI are 4+, LE's 4/5 HF, KE and 4/5 at ankles. Stocking glove sensory loss to just above the malleoli bilaterally. DTR's 1+. Cognitively displays fair insight and awareness. Doesn't initiate much  Skin: Skin is warm and dry.  Psychiatric: She remains quite/flat   Assessment/Plan: 1.  Functional deficits secondary to deconditioning after multiple medical issues which require 3+ hours per day of interdisciplinary therapy in a comprehensive inpatient rehab setting. Physiatrist is providing close team supervision and 24 hour management of active medical problems listed below. Physiatrist and rehab team continue to assess barriers to discharge/monitor patient progress toward functional and medical goals. FIM: FIM - Bathing Bathing Steps Patient Completed: Chest;Right Arm;Left Arm;Abdomen;Front perineal area;Right upper leg;Left upper leg;Left lower leg (including foot) Bathing: 4: Min-Patient completes 8-9 [id number] 10 parts or 75+ percent  FIM - Upper Body Dressing/Undressing Upper body dressing/undressing steps patient completed: Thread/unthread right bra strap;Thread/unthread left bra strap;Hook/unhook bra;Thread/unthread right sleeve of pullover shirt/dresss;Thread/unthread left sleeve of pullover shirt/dress;Put head through opening of pull over shirt/dress;Pull shirt over trunk Upper body dressing/undressing: 4: Min-Patient completed 75 plus % of tasks FIM - Lower Body Dressing/Undressing Lower body dressing/undressing steps patient completed: Don/Doff left sock Lower body dressing/undressing: 1: Total-Patient completed less than 25% of tasks  FIM - Toileting Toileting: 0: Activity did not occur  FIM - ArchivistToilet Transfers Toilet Transfers: 0-Activity did not occur  FIM - BankerBed/Chair Transfer Bed/Chair Transfer Assistive Devices: Bed rails Bed/Chair Transfer: 3: Bed > Chair or W/C: Mod A (lift or lower assist);3: Chair or W/C > Bed: Mod A (lift or lower assist)  FIM - Locomotion: Wheelchair Locomotion: Wheelchair: 1: Travels less than 50 ft with moderate assistance (Pt: 50 - 74%) FIM - Locomotion: Ambulation Locomotion: Ambulation Assistive Devices: Other (comment) (1BJ(4WW) Ambulation/Gait Assistance: 4: Min assist Locomotion: Ambulation:  2: Travels 50 - 149 ft with minimal  assistance (Pt.>75%)  Comprehension Comprehension Mode: Auditory Comprehension: 5-Follows basic conversation/direction: With no assist  Expression Expression Mode: Verbal Expression: 5-Expresses basic needs/ideas: With no assist  Social Interaction Social Interaction: 5-Interacts appropriately 90% of the time - Needs monitoring or encouragement for participation or interaction.  Problem Solving Problem Solving: 5-Solves complex 90% of the time/cues < 10% of the time  Memory Memory: 5-Recognizes or recalls 90% of the time/requires cueing < 10% of the time Medical Problem List and Plan:  1. Deconditioning secondary to exacerbation of CHF. Monitor for any signs of fluid overload  2. DVT Prophylaxis/Anticoagulation: SCDs. Monitor for any signs of DVT.  3. Pain Management: Neurontin 300 mg each bedtime  4. Neuropsych: This patient is capable of making decisions on her own behalf.  5. Aortic stenosis/atrial fibrillation. Continue Pradaxa as directed. Cardiac rate control. Amiodarone 200 mg daily, Toprol 12.5 mg daily and Lasix 40 mg twice a day on admit---will continue decreased dose 20mg  bid  as bp's are still a little low. Bun/cr look better today  - recheck labs again tomorrow  -relaxed fluid restriction to 1500cc yesterday 6. Hyponatremia. Sodium remains 133.   7. Hypothyroidism. Synthroid   LOS (Days) 2 A FACE TO FACE EVALUATION WAS PERFORMED  SWARTZ,ZACHARY T 01/18/2014 9:07 AM

## 2014-01-18 NOTE — Discharge Instructions (Addendum)
Information on my medicine - Pradaxa (dabigatran)  This medication education was reviewed with me or my healthcare representative as part of my discharge preparation.  The pharmacist that spoke with me during my hospital stay was:  Bajbus, Lauren, RPH  Why was Pradaxa prescribed for you? Pradaxa was prescribed for you to reduce the risk of forming blood clots that cause a stroke if you have a medical condition called atrial fibrillation (a type of irregular heartbeat).    What do you Need to know about PradAXa? Take your Pradaxa TWICE DAILY - one capsule in the morning and one tablet in the evening with or without food.  It would be best to take the doses about the same time each day.  The capsules should not be broken, chewed or opened - they must be swallowed whole.  Do not store Pradaxa in other medication containers - once the bottle is opened the Pradaxa should be used within FOUR months; throw away any capsules that havent been by that time.  Take Pradaxa exactly as prescribed by your doctor.  DO NOT stop taking Pradaxa without talking to the doctor who prescribed the medication.  Stopping without other stroke prevention medication to take the place of Pradaxa may increase your risk of developing a clot that causes a stroke.  Refill your prescription before you run out.  After discharge, you should have regular check-up appointments with your healthcare provider that is prescribing your Pradaxa.  In the future your dose may need to be changed if your kidney function or weight changes by a significant amount.  What do you do if you miss a dose? If you miss a dose, take it as soon as you remember on the same day.  If your next dose is less than 6 hours away, skip the missed dose.  Do not take two doses of PRADAXA at the same time.  Important Safety Information A possible side effect of Pradaxa is bleeding. You should call your healthcare provider right away if you experience any  of the following:   Bleeding from an injury or your nose that does not stop.   Unusual colored urine (red or dark brown) or unusual colored stools (red or black).   Unusual bruising for unknown reasons.   A serious fall or if you hit your head (even if there is no bleeding).  Some medicines may interact with Pradaxa and might increase your risk of bleeding or clotting while on Pradaxa. To help avoid this, consult your healthcare provider or pharmacist prior to using any new prescription or non-prescription medications, including herbals, vitamins, non-steroidal anti-inflammatory drugs (NSAIDs) and supplements.  This website has more information on Pradaxa (dabigatran): www.HDTVGame.dk.    Inpatient Rehab Discharge Instructions  LATICHA FERRUCCI Discharge date and time: No discharge date for patient encounter.   Activities/Precautions/ Functional Status: Activity: activity as tolerated Diet: cardiac diet Wound Care: none needed Functional status:  ___ No restrictions     ___ Walk up steps independently ___ 24/7 supervision/assistance   ___ Walk up steps with assistance ___ Intermittent supervision/assistance  ___ Bathe/dress independently ___ Walk with walker     ___ Bathe/dress with assistance ___ Walk Independently    ___ Shower independently _x__ Walk with assistance    ___ Shower with assistance ___ No alcohol     ___ Return to work/school ________  Special Instructions: CONTINUE 1200 ML FLUID RESTRICTION   COMMUNITY REFERRALS UPON DISCHARGE:    Home Health:   PT, RN  Agency:[facility] Phone:[phone] Date of last service:3/3/0/2015  Medical [facility]/Items [facility]   [phone]        My questions have been answered and I understand these instructions. I will adhere to these goals and the provided educational materials after my discharge from the hospital.  Patient/Caregiver Signature _______________________________ Date  __________  Clinician Signature _______________________________________ Date __________  Please bring this form and your medication list with you to all your follow-up doctor's appointments.

## 2014-01-18 NOTE — Progress Notes (Signed)
Physical Therapy Session Note  Patient Details  Name: [name] MRN: [id number] Date of Birth: November 15, 1923  Today's Date: 01/18/2014 Time: 1330-1400 Time Calculation (min): 30 min  Short Term Goals: Week 1:  PT Short Term Goal 1 (Week 1): Mod I for bed mobility with rail PT Short Term Goal 2 (Week 1): bed <> chair transfers min A PT Short Term Goal 3 (Week 1): ambulation with rolling walker ~ 150 feet with min A PT Short Term Goal 4 (Week 1): ascend/descend 2 stairs with R rail and min A  Skilled Therapeutic Interventions/Progress Updates:    pt received sitting in wheel chair in room with son present. Pt agreeable to therapy with encouragement from son. Treatment focused on gait training with 4WW vs. Gait training with RW. Pt ambulated with 4WW 75 feet x2 with min A and mod cues. Pt requires increased cueing with turning and negotiating change in surfaces or floor patterns. Pt prefers to use 4WW because she is used to ambulating with 4WW and can turn around to sit down for a rest break. As demonstrated to take a break, pt unable to process standing and turning back around from 4WW, pt required max verbal and tactile cues. Pt was very unsafe and had near LOB. Pt ambulated x50 feet with RW and min A demonstrating shuffling gait pattern. Pt reports being fearful of falling. Pt completed there ex in sitting, including toe raises, heel raises, LAQ, marching, hip abd and hamstring curls. 1x12 with x2 short rest breaks during there ex. Pt requires increased cues for facilitation of there ex. Pt returned to room with son present. Pt remained in w/c with all needs met.   Therapy Documentation Precautions:  Precautions Precautions: Fall Precaution Comments: hx of falls Restrictions Weight Bearing Restrictions: No Other Position/Activity Restrictions: hx of B TKR Pain: Pain Assessment Pain Assessment: No/denies pain Pain Score: 0-No pain   See FIM for current functional  status  Therapy/Group: Individual Therapy  Narek Kniss R 01/18/2014, 2:16 PM

## 2014-01-19 ENCOUNTER — Inpatient Hospital Stay (HOSPITAL_COMMUNITY): Payer: Medicare Other | Admitting: Physical Therapy

## 2014-01-19 ENCOUNTER — Encounter (HOSPITAL_COMMUNITY): Payer: Medicare Other

## 2014-01-19 ENCOUNTER — Inpatient Hospital Stay (HOSPITAL_COMMUNITY): Payer: Medicare Other

## 2014-01-19 DIAGNOSIS — R5381 Other malaise: Secondary | ICD-10-CM

## 2014-01-19 DIAGNOSIS — I509 Heart failure, unspecified: Secondary | ICD-10-CM

## 2014-01-19 LAB — CBC WITH DIFFERENTIAL/PLATELET
Basophils Absolute: 0 10*3/uL (ref 0.0–0.1)
Basophils Relative: 0 % (ref 0–1)
EOS PCT: 1 % (ref 0–5)
Eosinophils Absolute: 0.1 10*3/uL (ref 0.0–0.7)
HCT: 34.9 % — ABNORMAL LOW (ref 36.0–46.0)
HEMOGLOBIN: 11.8 g/dL — AB (ref 12.0–15.0)
LYMPHS ABS: 1.7 10*3/uL (ref 0.7–4.0)
Lymphocytes Relative: 18 % (ref 12–46)
MCH: 33.1 pg (ref 26.0–34.0)
MCHC: 33.8 g/dL (ref 30.0–36.0)
MCV: 97.8 fL (ref 78.0–100.0)
MONOS PCT: 11 % (ref 3–12)
Monocytes Absolute: 1 10*3/uL (ref 0.1–1.0)
Neutro Abs: 6.6 10*3/uL (ref 1.7–7.7)
Neutrophils Relative %: 70 % (ref 43–77)
PLATELETS: 182 10*3/uL (ref 150–400)
RBC: 3.57 MIL/uL — AB (ref 3.87–5.11)
RDW: 15.6 % — ABNORMAL HIGH (ref 11.5–15.5)
WBC: 9.4 10*3/uL (ref 4.0–10.5)

## 2014-01-19 LAB — BASIC METABOLIC PANEL
BUN: 29 mg/dL — ABNORMAL HIGH (ref 6–23)
CO2: 29 mEq/L (ref 19–32)
Calcium: 8.4 mg/dL (ref 8.4–10.5)
Chloride: 91 mEq/L — ABNORMAL LOW (ref 96–112)
Creatinine, Ser: 0.99 mg/dL (ref 0.50–1.10)
GFR calc Af Amer: 58 mL/min — ABNORMAL LOW (ref >90)
GFR calc non Af Amer: 50 mL/min — ABNORMAL LOW (ref >90)
Glucose, Bld: 93 mg/dL (ref 70–99)
POTASSIUM: 3.5 meq/L — AB (ref 3.7–5.3)
Sodium: 131 mEq/L — ABNORMAL LOW (ref 137–147)

## 2014-01-19 NOTE — Progress Notes (Signed)
Social Work Assessment and Plan Social Work Assessment and Plan  Patient Details  Name: [name] MRN: [id number] Date of Birth: [birth year]  Today's Date: 01/19/2014  Problem List:  Patient Active Problem List   Diagnosis Date Noted  . Acute systolic heart failure 45/12/8880  . CHF (congestive heart failure) 01/12/2014  . ASD (atrial septal defect)   . Aortic stenosis, moderate   . Moderate mitral regurgitation   . HTN (hypertension) 10/27/2013  . Aortic valve disorders 10/27/2013  . Gout 10/02/2013  . Abnormality of gait 04/17/2013  . Abnormal PFT 01/28/2013  . Diabetes with neurologic complications   . Physical deconditioning 06/06/2012  . OSA (obstructive sleep apnea) 06/04/2012  . Chronic atrial fibrillation   . Diastolic heart failure, NYHA class 1   . Hypothyroidism   . Asthma    Past Medical History:  Past Medical History  Diagnosis Date  . Hypertension   . Peripheral neuropathy   . Asthma   . High cholesterol   . Diabetes with neurologic complications   . PAD (peripheral artery disease)   . Enlarged heart   . Anginal pain 05/28/12  . Pneumonia 11/2010  . Sleep apnea     cpap  . Hypothyroidism   . Headache(784.0) 05/29/12    "recently"  . Stroke 05/2005; 08/2010    !mini"  . Personal history of gout   . Hypothyroidism   . DJD (degenerative joint disease)   . Carpal tunnel syndrome, bilateral   . Recurrent labyrinthitis   . GERD (gastroesophageal reflux disease)     S/P esophageal dilation in 1996  . Gallstone     Silent  . Fibrocystic breast changes   . Hemorrhoids     with bleeding  . Hx of adenomatous colonic polyps     Dr Sammuel Cooper  . Diverticulitis     left side  . MR (mitral regurgitation)     moderate  . Mild anemia     hemoglobin 11.8 on 06/2009  . Edema of lower extremity     Chronic  . Ischemic colitis 8/13  . Aortic stenosis, moderate   . Chronic diastolic CHF (congestive heart failure)   . Moderate mitral regurgitation   .  CKD (chronic kidney disease) stage 3, GFR 30-59 ml/min     "something on labs always say she has some problems"  . Chronic atrial fibrillation    Past Surgical History:  Past Surgical History  Procedure Laterality Date  . Tonsillectomy and adenoidectomy  1960's  . Dilation and curettage of uterus    . Vaginal hysterectomy  1970's  . Total knee arthroplasty  1970-~2002    left; right  . Cataract extraction w/ intraocular lens  implant, bilateral  ?1970's   Social History:  reports that she has never smoked. She has never used smokeless tobacco. She reports that she does not drink alcohol or use illicit drugs.  Family / Support Systems Marital Status: Widow/Widower Patient Roles: Parent ChildrenChauncy Passy  [phone]  [phone] Other Supports: Wynonia Hazard  [phone] Anticipated Caregiver: Hired caregiver Ability/Limitations of Caregiver: Caregiver can assist with light min if necessary-did not need to prior to admission-pt able to ambulate on her own with her walker Caregiver Availability: 24/7 Family Dynamics: Close knit family which two daughters are local and son is in [place].  All are very involved with pt and her care.  They will make sure Mom has whatever has needs at discharge.  Social History Preferred language: English Religion:  B[employer]ist Cultural Background: No issues Education: Some College Read: Yes Write: Yes Employment Status: Retired Freight forwarder Issues: No issues/history Guardian/Conservator: None- according to MD pt is capable of making her own decisions while here.   Abuse/Neglect Physical Abuse: Denies Verbal Abuse: Denies Sexual Abuse: Denies Exploitation of patient/patient's resources: Denies Self-Neglect: Denies  Emotional Status Pt's affect, behavior adn adjustment status: Pt is motivated to do well here so she can go home soon.  She is trying to walk better and get back to where she was  before coming here.  She is motivated and has a bright affect.  She has been here before and knows the process. Recent Psychosocial Issues: Other medical issues-were managed prior to admission Pyschiatric History: No issues-deferred depression screen due to doing well and felt it was not necessary at this time.  Will monitor while here. Substance Abuse History: No issues  Patient / Family Perceptions, Expectations & Goals Pt/Family understanding of illness & functional limitations: Pt and daughter have a good understanding of her condition and reason she is here.  She is feeling much better and just now needs to get stronger.  She can see her progress and this is encouraging to her.  She feels she will be here a short time. Premorbid pt/family roles/activities: Mother, grandmother, Retiree, Church member, etc Anticipated changes in roles/activities/participation: resume at discharge Pt/family expectations/goals: Pt states; " I want to go home soon but want to be ready."  Daughter states; ' We just want her ready to go home and not come right back."  [employer]: Other (Comment) (Private duty caregiver 24 hr) Premorbid Home Care/DME Agencies: Other (Comment) (Had in the past) Transportation available at discharge: Family and caregiver Resource referrals recommended: Support group (specify) (CVA & CHF Support group)  Discharge Planning Living Arrangements: Other (Comment) (Has 24 hr caregivers) Support Systems: Children;Friends/neighbors;Church/faith community Type of Residence: Private residence Insurance Resources: Education officer, museum (specify) Sports administrator) Financial Resources: Social Security Financial Screen Referred: No Living Expenses: Own Money Management: Family Does the patient have any problems obtaining your medications?: No Home Management: Caregiver Patient/Family Preliminary Plans: Return home with 24 hr caregivers and children involved with her care  and making sure pt's needs are met.  Has been here on rehab before and knows the process.  Daughter wants to be kept updated on Mom's progress. Social Work Anticipated Follow Up Needs: HH/OP;Support Group  Clinical Impression Pleasant female who has been here before and is making good progress.  Family is very supportive and will make sure her needs are met. Hope she will be here a short time, so can return home soon.  Elease Hashimoto 01/19/2014, 1:03 PM

## 2014-01-19 NOTE — Progress Notes (Signed)
Physical Therapy Session Note  Patient Details  Name: [name] MRN: [id number] Date of Birth: Sep 01, 1924  Today's Date: 01/19/2014 Time: 4098-1191 and 1345-1430 Time Calculation (min): 54 min and 45 min  Short Term Goals: Week 1:  PT Short Term Goal 1 (Week 1): Mod I for bed mobility with rail PT Short Term Goal 2 (Week 1): bed <> chair transfers min [name] PT Short Term Goal 3 (Week 1): ambulation with rolling walker ~ 150 feet with min [name] PT Short Term Goal 4 (Week 1): ascend/descend 2 stairs with R rail and min [name]  Skilled Therapeutic Interventions/Progress Updates:    Treatment Session 1: Pt received seated in w/c; agreeable to therapy. Pt reports fear of falling secondary to having fallen approx once per month for past 2 months. Session focused on increasing stability/independence with gait, functional transfers, and stair negotiation. Pt performed gait x70', 2x65', x35' (seated rest breaks, per pt request, between each trial) in controlled environment with rollator and min [name], manual facilitation of bilat weight shift, verbal cueing for increased step length with ineffective within-session carryover. Pt demonstrated the following gait deviations: bilat knees flexed during stance phase of respective LE, decreased bilat step length, flexed trunk, bilat midfoot/forefoot strike, narrow BOS. Pt required cueing for setup, technique, and hand placement for sit<>stand transfers during initial 50% of session with effective within-session carryover of hand placement, inconsistent carryover of setup/technique.   Pt with poor safety awareness when performing sit<>stand from rollator seat, as exhibited by pt attempt to unlock rollator prior to turning body 180 degrees. Therapist reiterated importance of locking rollator brakes during all transfers. Pt verbalized understanding but gave inconsistent return demonstration. Therapist coaxed pt to attempt gait using rolling walker due to unreliable brakes on  rollator, unsafe use of brakes during transfers. After 10' gait trial with rolling walker, pt refused to utilize rolling walker.  Negotiated 3 steps with bilat rails; ascended forward-facing with step-to pattern, min [name], and min cueing for technique, sequencing with effective return demonstration; descended sideways (secondary to pt fear when descending forward-facing) with bilat UE support at R rail with step-to pattern and mod [name]. Session ended in pt room, where pt was left seated in w/c with all needs within reach.   Treatment Session 2: Pt received seated in bedside chair accompanied by daughter, [name]. Pt agreeable to therapy. Session focused on improving safety with functional transfers and gait. Pt performed gait 2x70', 2x60' in controlled environment with rollator and min guard-min [name] for stability/balance, significantly increased time. Gait deviations as described above. Seated rest breaks taken between each gait trial, per pt request, secondary to pt-perception of "legs feeling wobbly," per pt. Seated rest break on rollator with max cueing and mod [name] to stabilize rollator. Therapist educated pt, daughter on instability of rollator which increases pt risk of falling while sitting on rollator seat. Daughter in agreement. Pt will require reinforcement. With max coaxing, pt refused to attempt using rolling walker with gait. Daughter's questions concerning D/C deferred to CSW, [place]. Session ended in pt room, where pt was left seated in w/c with daughter present and all needs within reach.  Therapy Documentation Precautions:  Precautions Precautions: Fall Precaution Comments: hx of falls Restrictions Weight Bearing Restrictions: No Other Position/Activity Restrictions: hx of B TKR General: Amount of Missed PT Time (min): 6 Minutes Missed Time Reason: Patient fatigue Pain: Pain Assessment Pain Assessment: No/denies pain Pain Score: 0-No pain Locomotion : Ambulation Ambulation/Gait Assistance:  4: Min assist   See  FIM for current functional status  Therapy/Group: Individual Therapy  [name], [name] 01/19/2014, 11:52 AM

## 2014-01-19 NOTE — Care Management Note (Signed)
[facility]patient Rehabilitation Center Individual Statement of Services  Patient Name:  [name]  Date:  01/19/2014  Welcome to the Inpatient Rehabilitation Center.  Our goal is to provide you with an individualized program based on your diagnosis and situation, designed to meet your specific needs.  With this comprehensive rehabilitation program, you will be expected to participate in at least 3 hours of rehabilitation therapies Monday-Friday, with modified therapy programming on the weekends.  Your rehabilitation program will include the following services:  Physical Therapy (PT), Occupational Therapy (OT), 24 hour per day rehabilitation nursing, Case Management (Social Worker), Rehabilitation Medicine, Nutrition Services and Pharmacy Services  Weekly team conferences will be held on Wednesday to discuss your progress.  Your Social Worker will talk with you frequently to get your input and to update you on team discussions.  Team conferences with you and your family in attendance may also be held.  Expected length of stay: 10-14 days Overall anticipated outcome: supervision/mod/i level  Depending on your progress and recovery, your program may change. Your Social Worker will coordinate services and will keep you informed of any changes. Your Social Worker's name and contact numbers are listed  below.  The following services may also be recommended but are not provided by the Inpatient Rehabilitation Center:    Home Health Rehabiltiation Services  Outpatient Rehabilitation Services    Arrangements will be made to provide these services after discharge if needed.  Arrangements include referral to agencies that provide these services.  Your insurance has been verified to be:  Medicare & UHC Your primary doctor is:  Dr Merlene LaughterHal Stoneking  Pertinent information will be shared with your doctor and your insurance company.  Social Worker:  Dossie DerBecky Janyra Barillas, SW [phone] or ([phone])  [phone]  Information discussed with and copy given to patient by: Lucy Chrisupree, Eudell Julian G, 01/19/2014, 12:41 PM

## 2014-01-19 NOTE — IPOC Note (Signed)
Overall Plan of Care [facility](IPOC) Patient Details Name: [name] MRN: [id number] DOB: [birth year]  Admitting Diagnosis: Deconditioning/CHF  Hospital Problems: Active Problems:   Physical deconditioning     Functional Problem List: Nursing    PT Balance;Motor;Safety  OT Balance;Cognition;Endurance;Safety  SLP    TR         Basic ADL's: OT Grooming;Bathing;Dressing;Toileting     Advanced  ADL's: OT       Transfers: PT Bed Mobility;Bed to Chair;Car  OT Toilet;Tub/Shower     Locomotion: PT Ambulation;Stairs     Additional Impairments: OT    SLP        TR      Anticipated Outcomes Item Anticipated Outcome  Self Feeding    Swallowing      Basic self-care   supervision  Toileting   supervision   Bathroom Transfers  supervision  Bowel/Bladder   supervision  Transfers  S for transfers  Locomotion  S for ambulation and stairs  Communication     Cognition     Pain     Safety/Judgment      Therapy Plan: PT Intensity: Minimum of 1-2 x/day ,45 to 90 minutes PT Frequency: 5 out of 7 days PT Duration Estimated Length of Stay: 10 to 14 days OT Intensity: Minimum of 1-2 x/day, 45 to 90 minutes OT Frequency: Total of 15 hours over 7 days OT Duration/Estimated Length of Stay: 10-14 days         Team Interventions: Nursing Interventions    PT interventions Balance/vestibular training;Ambulation/gait training;Functional mobility training;Patient/family education;Therapeutic Exercise;UE/LE Coordination activities;Therapeutic Activities;UE/LE Strength taining/ROM;Stair training;Neuromuscular re-education  OT Interventions Balance/vestibular training;Community reintegration;Discharge planning;Patient/family education;Functional mobility training;Self Care/advanced ADL retraining;Therapeutic Activities;Therapeutic Exercise;UE/LE Strength taining/ROM;Wheelchair propulsion/positioning  SLP Interventions    TR Interventions    SW/CM Interventions Discharge  Planning;Psychosocial Support;Patient/Family Education    Team Discharge Planning: Destination: PT-Home ,OT-   , SLP-  Projected Follow-up: PT-Home health PT, OT-   , SLP-  Projected Equipment Needs: PT-To be determined, OT-  , SLP-  Equipment Details: PT- , OT-  Patient/family involved in discharge planning: PT- Patient;Family member/caregiver,  OT- , SLP-   MD ELOS: 10-14 days Medical Rehab Prognosis:  Excellent Assessment: The patient has been admitted for CIR therapies. The team will be addressing, functional mobility, strength, stamina, balance, safety, adaptive techniques/equipment, self-care, bowel and bladder mgt, patient and caregiver education, CV stamina, ego-support, nutrition. Goals have been set at supervision for mobility and self-care.    Ranelle OysterZachary T. Marria Mathison, MD, FAAPMR      See Team Conference Notes for weekly updates to the plan of care

## 2014-01-19 NOTE — Progress Notes (Signed)
[facility] PHYSICAL MEDICINE & REHABILITATION     PROGRESS NOTE 80 y.o. right-handed female with history of hypertension, mitral regurgitation and aortic stenosis/atrial fibrillation maintained on Pradaxa. Patient lives alone with 24-hour hired care. Presented 01/11/2014 with progressive shortness of breath and lower extremity edema as well as a 10 pound weight gain. Chest x-ray with evidence of volume overload. Noted sodium level 121 on admission. Patient was placed on intravenous Lasix. Echocardiogram with ejection fraction of 65% no wall motion abnormalities there is mild stenosis with aortic valve as well as moderate mitral regurgitation. Findings of enterococcus UTI and received one dose of fosfomycin     Subjective/Complaints: No new issues. Good night of sleep again. No pain c/os no SOB/CP Sleeping with CPAP, nasal A 12 point review of systems has been performed and if not noted above is otherwise negative.   Objective: Vital Signs: Blood pressure 130/78, pulse 69, temperature 97.5 F (36.4 C), temperature source Oral, resp. rate 20, height 5' 6.93" (1.7 m), weight 68.3 kg (150 lb 9.2 oz), SpO2 96.00%. No results found. No results found for this basename: WBC, HGB, HCT, PLT,  in the last 72 hours  Recent Labs  01/16/14 1800 01/18/14 0600  NA 133* 133*  K 3.7 4.5  CL 91* 91*  GLUCOSE 164* 89  BUN 31* 30*  CREATININE 1.44* 1.21*  CALCIUM 8.1* 8.7   CBG (last 3)  No results found for this basename: GLUCAP,  in the last 72 hours  Wt Readings from Last 3 Encounters:  01/19/14 68.3 kg (150 lb 9.2 oz)  01/16/14 73.392 kg (161 lb 12.8 oz)  11/17/13 71.668 kg (158 lb)    Physical Exam:  Constitutional: She is oriented to person, place, and time. She appears well-developed. No distress  HENT: oral mucosa pink and moist, dentition fair  Head: Normocephalic.  Eyes: EOM are normal.  Neck: Normal range of motion. Neck supple. No thyromegaly present.  Cardiovascular:  Cardiac  rate controlled, irreg/irreg with systolic murmur  Respiratory: Effort normal and breath sounds normal. No respiratory distress. No wheezes, rales. Using cpap GI: Soft. Bowel sounds are normal. She exhibits no distension. Non-tender Neurological: She is alert and oriented to person, place, and time. CN exam normal. Strength UE: 4/5 deltoid bicep, tricep, HI are 4+, LE's 4/5 HF, KE and 4/5 at ankles. Stocking glove sensory loss to just above the malleoli bilaterally. DTR's 1+. Cognitively displays fair insight and awareness. Doesn't initiate much  Skin: Skin is warm and dry.  Psychiatric: no lability or agitation   Assessment/Plan: 1. Functional deficits secondary to deconditioning after multiple medical issues which require 3+ hours per day of interdisciplinary therapy in a comprehensive inpatient rehab setting. Physiatrist is providing close team supervision and 24 hour management of active medical problems listed below. Physiatrist and rehab team continue to assess barriers to discharge/monitor patient progress toward functional and medical goals. FIM: FIM - Bathing Bathing Steps Patient Completed: Chest;Right Arm;Left Arm;Abdomen;Front perineal area;Right upper leg;Left upper leg;Left lower leg (including foot) Bathing: 0: Activity did not occur  FIM - Upper Body Dressing/Undressing Upper body dressing/undressing steps patient completed: Thread/unthread right sleeve of pullover shirt/dresss;Thread/unthread left sleeve of pullover shirt/dress;Put head through opening of pull over shirt/dress;Pull shirt over trunk Upper body dressing/undressing: 5: Supervision: Safety issues/verbal cues FIM - Lower Body Dressing/Undressing Lower body dressing/undressing steps patient completed: Pull pants up/down Lower body dressing/undressing: 2: Max-Patient completed 25-49% of tasks  FIM - Toileting Toileting steps completed by patient: Performs perineal hygiene Toileting  Assistive Devices: Grab bar or  rail for support Toileting: 2: Max-Patient completed 1 of 3 steps  FIM - Diplomatic Services operational officerToilet Transfers Toilet Transfers Assistive Devices: Grab bars;Walker Toilet Transfers: 4-To toilet/BSC: Min A (steadying Pt. > 75%);4-From toilet/BSC: Min A (steadying Pt. > 75%)  FIM - Bed/Chair Transfer Bed/Chair Transfer Assistive Devices: Arm rests;Walker Bed/Chair Transfer: 2: Chair or W/C > Bed: Max A (lift and lower assist);2: Bed > Chair or W/C: Max A (lift and lower assist)  FIM - Locomotion: Wheelchair Locomotion: Wheelchair: 0: Activity did not occur FIM - Locomotion: Ambulation Locomotion: Ambulation Assistive Devices: Walker - Rolling;Other (comment) (4 [employer]) Ambulation/Gait Assistance: 4: Min assist Locomotion: Ambulation: 2: Travels 50 - 149 ft with minimal assistance (Pt.>75%)  Comprehension Comprehension Mode: Auditory Comprehension: 5-Follows basic conversation/direction: With extra time/assistive device  Expression Expression Mode: Verbal Expression: 5-Expresses basic needs/ideas: With extra time/assistive device  Social Interaction Social Interaction: 5-Interacts appropriately 90% of the time - Needs monitoring or encouragement for participation or interaction.  Problem Solving Problem Solving: 4-Solves basic 75 - 89% of the time/requires cueing 10 - 24% of the time  Memory Memory: 5-Recognizes or recalls 90% of the time/requires cueing < 10% of the time Medical Problem List and Plan:  1. Deconditioning secondary to exacerbation of CHF. Monitor for any signs of fluid overload  2. DVT Prophylaxis/Anticoagulation: SCDs. Monitor for any signs of DVT.  3. Pain Management: Neurontin 300 mg each bedtime  4. Neuropsych: This patient is capable of making decisions on her own behalf.  5. Aortic stenosis/atrial fibrillation. Continue Pradaxa as directed. Cardiac rate control. Amiodarone 200 mg daily, Toprol 12.5 mg daily and Lasix 20mg  bid  aBPs normalizing  - monitor labs   -relaxed fluid  restriction to 1500cc yesterday 6. Hyponatremia. Mild monitor   7. Hypothyroidism. Synthroid   LOS (Days) 3 A FACE TO FACE EVALUATION WAS PERFORMED  Erick ColaceKIRSTEINS,Aylene Acoff E 01/19/2014 7:47 AM

## 2014-01-19 NOTE — Progress Notes (Signed)
Occupational Therapy Session Note  Patient Details  Name: Moody BruinsClarice V Chavira MRN: [id number] Date of Birth: 04-Apr-1924  Today's Date: 01/19/2014 Time: 0930-1030 and 1300-1330 Time Calculation (min): 60 min and 30 min   Short Term Goals: Week 1:  OT Short Term Goal 1 (Week 1): Patient will complete grooming at the sink with modified independence. OT Short Term Goal 2 (Week 1): Patient will transfers wheelchair to/from toilet via stand pivot with minimum assistance. OT Short Term Goal 3 (Week 1): Patient will complete shower transfer with minimum assistance. OT Short Term Goal 4 (Week 1): Patient will complete lower body dressing with minimum assistance. OT Short Term Goal 5 (Week 1): Patient will complete upper body dressing with setup.  Skilled Therapeutic Interventions/Progress Updates:    Session 1: Pt seen for ADL retraining with focus on functional transfers, activity tolerance, and safety awareness. Pt received supine in bed requesting to go to bathroom. Ambulated with min assist using rollator bed>toilet (approx 10 feet) and mod cues for safety with rollator. Pt required min assist for sit<>stand throughout session. Completed bathing at sink requiring assist with feet and buttocks. Pt very fearful of falling however achieving upright posture with min facilitation. Attempted to use stool for washing feet and LB dressing however unsuccessful. At end of session pt completed oral care in standing with min assist. Pt required frequent rest breaks throughout session secondary to fatigue and required multimodal cues for pursed lip breathing. Pt's SpO2>92% on room air. Pt left sitting in w/c with all needs in reach.   Session 2: Pt seen for 1:1 OT session with focus on activity tolerance, safety awareness, and dynamic standing balance. Pt received sitting in w/c requesting to go to bathroom. Ambulated with rollator with min assist and min cues for safety. Pt required assist with clothing management  secondary to fatigue and fear of falling. Pt required rest break then practiced reaching out of BOS in standing to retrieve clothing items from closes. Pt required mod cues for safety and min physical assist. Pt then placed clothing in drawer at waist level requiring cues for standing closer to rollator. Pt required rest breaks before ambulating to sink with rollator to water flowers. Pt with min recall of safety with placement of rollator when standing at sink/dresser, requiring frequent cues for safety. At end of session pt left sitting in w/c with all needs in reach. Pt's SpO2>92% on room air throughout session.   Therapy Documentation Precautions:  Precautions Precautions: Fall Precaution Comments: hx of falls Restrictions Weight Bearing Restrictions: No Other Position/Activity Restrictions: hx of B TKR General: General Missed Time Reason: Patient fatigue Vital Signs:   Pain: No report of pain during therapy sessions.   See FIM for current functional status  Therapy/Group: Individual Therapy  Daneil Danerkinson, Iolani Twilley N 01/19/2014, 12:31 PM

## 2014-01-20 ENCOUNTER — Inpatient Hospital Stay (HOSPITAL_COMMUNITY): Payer: Medicare Other | Admitting: Physical Therapy

## 2014-01-20 ENCOUNTER — Inpatient Hospital Stay (HOSPITAL_COMMUNITY): Payer: Medicare Other

## 2014-01-20 DIAGNOSIS — I509 Heart failure, unspecified: Secondary | ICD-10-CM

## 2014-01-20 DIAGNOSIS — R5381 Other malaise: Secondary | ICD-10-CM

## 2014-01-20 NOTE — Progress Notes (Signed)
Physical Therapy Session Note  Patient Details  Name: Judith BruinsClarice V [name] MRN: [id number] Date of Birth: 12-May-1924  Today's Date: 01/20/2014 Time: 1005-1100 and 1345-1426 Time Calculation (min): 55 min and 41 min  Short Term Goals: Week 1:  PT Short Term Goal 1 (Week 1): Mod I for bed mobility with rail PT Short Term Goal 2 (Week 1): bed <> chair transfers min A PT Short Term Goal 3 (Week 1): ambulation with rolling walker ~ 150 feet with min A PT Short Term Goal 4 (Week 1): ascend/descend 2 stairs with R rail and min A  Skilled Therapeutic Interventions/Progress Updates:    Pt received seated in w/c; agreeable to session. Toilet transfer with rollator and grab bars, min A. Pt continent of bladder; RN notified. Dynamic standing balance x2 minutes with intermittent UE support while washing hands at sink, min guard. Session focused on blocked practice of functional transfers with emphasis on locking rollator brakes appropriately. Performed multiple sit<>stand transfers from w/c, standard chair with rollator. Pt initially required min A and max multimodal cueing for safe use of rollator. With increased repetition, pt required close supervision and min verbal cueing to subtle cueing for safety with transfers. Pt performed final 2 transfers with supervision and effective carryover of all cueing. Frequent seated rest breaks taken throughout session, per pt request, secondary to increased fatigue.  Performed gait x55' in controlled environment with rollator and min A, min verbal cueing for proximity to rolling walker during turning; gait trial ended, per pt request, secondary to fatigue. Max verbal cueing for safety (locking rollator brakes, maintaining brakes in locked position while transitioning to/from sitting) with sitting on rollator seat for rest break. Pt will need reinforcement.  Therapist departed with pt seated in w/c with all needs within reach.   Treatment Session 2: Pt received seated in  w/c; agreeable to session. Daughter present. Performed toilet transfer with grab bars requiring min A; rollator not utilized for transfer secondary to urgency. Session focused on increasing safety/independence with gait and stair negotiation. Performed w/c mobility x75' in controlled environment with bilat LE's with max A, manual facilitation of bilat reciprocal LE flexion/extension, tactile cueing at bilat knees to increase weightbearing. Pt performed gait x30' in controlled environment with RUE support at R hallway hand rail and L HHA with mod A, increased time, manual facilitation of bilat weight shifting, verbal cueing for increased step length. Negotiated 3 steps with bilat rails, forward-facing, step-to pattern with min A, increased time, and manual facilitation of weight shifting. After seated rest break, performed gait x101' in controlled environment with rollator, min A for initial 10' for bilat weight shifting; pt with effective carryover of bilat weight shifting for remainder of trial.   During current session, pt exhibits effective between-session carryover of cueing for safe use of rollator unless pt is fatigued (at end of gait trial). Will continue to reinforce. Therapist departed with pt seated in w/c with daughter present and all needs within reach.  Therapy Documentation Precautions:  Precautions Precautions: Fall Precaution Comments: hx of falls Restrictions Weight Bearing Restrictions: No Other Position/Activity Restrictions: hx of B TKR Pain: Pain Assessment Pain Assessment: No/denies pain Pain Score: 0-No pain Locomotion : Ambulation Ambulation/Gait Assistance: 4: Min guard   See FIM for current functional status  Therapy/Group: Individual Therapy  Charlesetta Milliron, [name] 01/20/2014, 12:22 PM

## 2014-01-20 NOTE — Progress Notes (Signed)
[name] PHYSICAL MEDICINE & REHABILITATION     PROGRESS NOTE 80 y.o. right-handed female with history of hypertension, mitral regurgitation and aortic stenosis/atrial fibrillation maintained on Pradaxa. Patient lives alone with 24-hour hired care. Presented 01/11/2014 with progressive shortness of breath and lower extremity edema as well as a 10 pound weight gain. Chest x-ray with evidence of volume overload. Noted sodium level 121 on admission. Patient was placed on intravenous Lasix. Echocardiogram with ejection fraction of 65% no wall motion abnormalities there is mild stenosis with aortic valve as well as moderate mitral regurgitation. Findings of enterococcus UTI and received one dose of fosfomycin     Subjective/Complaints: "Cold all over". Good night of sleep again. No pain c/os no SOB/CP Sleeping with CPAP, nasal A 12 point review of systems has been performed and if not noted above is otherwise negative.   Objective: Vital Signs: Blood pressure 99/59, pulse 64, temperature 97.9 F (36.6 C), temperature source Axillary, resp. rate 18, height 5' 6.93" (1.7 m), weight 70.9 kg (156 lb 4.9 oz), SpO2 98.00%. No results found.  Recent Labs  01/19/14 0720  WBC 9.4  HGB 11.8*  HCT 34.9*  PLT 182    Recent Labs  01/18/14 0600 01/19/14 0720  NA 133* 131*  K 4.5 3.5*  CL 91* 91*  GLUCOSE 89 93  BUN 30* 29*  CREATININE 1.21* 0.99  CALCIUM 8.7 8.4   CBG (last 3)  No results found for this basename: GLUCAP,  in the last 72 hours  Wt Readings from Last 3 Encounters:  01/20/14 70.9 kg (156 lb 4.9 oz)  01/16/14 73.392 kg (161 lb 12.8 oz)  11/17/13 71.668 kg (158 lb)    Physical Exam:  Constitutional: She is oriented to person, place, and time. She appears well-developed. No distress  HENT: oral mucosa pink and moist, dentition fair  Head: Normocephalic.  Eyes: EOM are normal.  Neck: Normal range of motion. Neck supple. No thyromegaly present.  Cardiovascular:   Cardiac rate controlled, irreg/irreg with systolic murmur  Respiratory: Effort normal and breath sounds normal. No respiratory distress. No wheezes, rales. Using cpap GI: Soft. Bowel sounds are normal. She exhibits no distension. Non-tender Neurological: She is alert and oriented to person, place, and time. CN exam normal. Strength UE: 4/5 deltoid bicep, tricep, HI are 4+, LE's 4/5 HF, KE and 4/5 at ankles. Stocking glove sensory loss to just above the malleoli bilaterally. DTR's 1+. Cognitively displays fair insight and awareness. Doesn't initiate much  Skin: Skin is warm and dry.  Psychiatric: no lability or agitation   Assessment/Plan: 1. Functional deficits secondary to deconditioning after multiple medical issues which require 3+ hours per day of interdisciplinary therapy in a comprehensive inpatient rehab setting. Physiatrist is providing close team supervision and 24 hour management of active medical problems listed below. Physiatrist and rehab team continue to assess barriers to discharge/monitor patient progress toward functional and medical goals. FIM: FIM - Bathing Bathing Steps Patient Completed: Chest;Right Arm;Left Arm;Abdomen;Front perineal area;Right upper leg;Left upper leg Bathing: 3: Mod-Patient completes 5-7 [id number] 10 parts or 50-74%  FIM - Upper Body Dressing/Undressing Upper body dressing/undressing steps patient completed: Thread/unthread right sleeve of pullover shirt/dresss;Thread/unthread left sleeve of pullover shirt/dress;Put head through opening of pull over shirt/dress;Pull shirt over trunk;Thread/unthread right bra strap;Thread/unthread left bra strap;Hook/unhook bra Upper body dressing/undressing: 4: Min-Patient completed 75 plus % of tasks FIM - Lower Body Dressing/Undressing Lower body dressing/undressing steps patient completed: Pull underwear up/down;Pull pants up/down;Don/Doff right shoe Lower body dressing/undressing: 2:  Max-Patient completed 25-49% of  tasks  FIM - Toileting Toileting steps completed by patient: Performs perineal hygiene Toileting Assistive Devices: Grab bar or rail for support Toileting: 2: Max-Patient completed 1 of 3 steps  FIM - Diplomatic Services operational officer Devices: Grab bars;Walker Toilet Transfers: 4-To toilet/BSC: Min A (steadying Pt. > 75%);4-From toilet/BSC: Min A (steadying Pt. > 75%)  FIM - Bed/Chair Transfer Bed/Chair Transfer Assistive Devices: Arm rests;Environmental consultant (rollator) Bed/Chair Transfer: 4: Chair or W/C > Bed: Min A (steadying Pt. > 75%);4: Bed > Chair or W/C: Min A (steadying Pt. > 75%)  FIM - Locomotion: Wheelchair Locomotion: Wheelchair: 0: Activity did not occur FIM - Locomotion: Ambulation Locomotion: Ambulation Assistive Devices: Designer, industrial/product (rollator) Ambulation/Gait Assistance: 4: Min assist Locomotion: Ambulation: 2: Travels 50 - 149 ft with minimal assistance (Pt.>75%)  Comprehension Comprehension Mode: Auditory Comprehension: 5-Follows basic conversation/direction: With extra time/assistive device  Expression Expression Mode: Verbal Expression: 5-Expresses basic needs/ideas: With extra time/assistive device  Social Interaction Social Interaction: 5-Interacts appropriately 90% of the time - Needs monitoring or encouragement for participation or interaction.  Problem Solving Problem Solving: 4-Solves basic 75 - 89% of the time/requires cueing 10 - 24% of the time  Memory Memory: 3-Recognizes or recalls 50 - 74% of the time/requires cueing 25 - 49% of the time Medical Problem List and Plan:  1. Deconditioning secondary to exacerbation of CHF. Monitor for any signs of fluid overload  2. DVT Prophylaxis/Anticoagulation: SCDs. Monitor for any signs of DVT.  3. Pain Management: Neurontin 300 mg each bedtime  4. Neuropsych: This patient is capable of making decisions on her own behalf.  5. Aortic stenosis/atrial fibrillation. Continue Pradaxa as directed. Cardiac  rate control. Amiodarone 200 mg daily, Toprol 12.5 mg daily and Lasix 20mg  bid  aBPs normalizing  - monitor labs   -relaxed fluid restriction to 1500cc yesterday 6. Hyponatremia. Mild monitor   7. Hypothyroidism. Synthroid   LOS (Days) 4 A FACE TO FACE EVALUATION WAS PERFORMED  Erick Colace 01/20/2014 7:33 AM

## 2014-01-20 NOTE — Progress Notes (Signed)
Occupational Therapy Session Note  Patient Details  Name: [name] MRN: [id number] Date of Birth: 1924-07-02  Today's Date: 01/20/2014 Time: [id number] and 1130-1200 Time Calculation (min): 55 min and 30 min   Short Term Goals: Week 1:  OT Short Term Goal 1 (Week 1): Patient will complete grooming at the sink with modified independence. OT Short Term Goal 2 (Week 1): Patient will transfers wheelchair to/from toilet via stand pivot with minimum assistance. OT Short Term Goal 3 (Week 1): Patient will complete shower transfer with minimum assistance. OT Short Term Goal 4 (Week 1): Patient will complete lower body dressing with minimum assistance. OT Short Term Goal 5 (Week 1): Patient will complete upper body dressing with setup.  Skilled Therapeutic Interventions/Progress Updates:    Session 1: Pt seen for ADL retraining with focus on safety awareness, activity tolerance, and dynamic standing balance. Pt received sitting EOB with RN present. Pt requesting to go to bathroom. Ambulated with rollator with min assist and mod cues for safety with locking breaks. When ambulating out of bathroom pt required multimodal cues for L hand placement on rollator as she was attempting to ambulate while keeping hand on grab bar. Engaged in bathing at sink. Pt required frequent rest breaks d/t fatigue and cues for pursed lip breathing. Pt very fearful of falling when in standing, requiring min assist and verbal cues of encouragement. Pt with slight posterior lean requiring manual facilitation and verbal cues to correct. Utilized step stool for assisting with LB dressing/bathing and some carryover. Pt reporting having assistance with LB dressing at home. Completed oral care in standing with min assist and cues for posture as pt attempting rely on leaning against sink for support. At end of session pt left sitting in w/c with all needs in reach.  Session 2: Pt seen for 1:1 OT session with focus on dynamic  standing balance and activity tolerance. Pt received sitting in w/c requesting to go to bathroom. No carryover noted from therapy session this AM as pt required mod cues for safety with locking brakes and hand placement on rollator. Engaged in BUE task of folding laundry in standing increase stability in standing to decrease risk of fall. Pt very fearful of falling requiring min cues of encouragement and in assist for standing balance. Pt progressing to min guard assist for standing balance with task. Pt fatigued quickly, tolerating approx 30 sec-1 min of task before requiring rest break. Pt required min assist for all sit<>stand during task and demonstrated increased carryover of safety when descending as she was reaching back for arm rest of w/c. At end of session pt left sitting in w/c with all needs in reach.   Therapy Documentation Precautions:  Precautions Precautions: Fall Precaution Comments: hx of falls Restrictions Weight Bearing Restrictions: No Other Position/Activity Restrictions: hx of B TKR General: General Amount of Missed OT Time (min): 5 Minutes Vital Signs:   Pain: Pain Assessment Pain Assessment: No/denies pain Pain Score: 0-No pain  See FIM for current functional status  Therapy/Group: Individual Therapy  Daneil Danerkinson, [name] 01/20/2014, 12:19 PM

## 2014-01-21 ENCOUNTER — Inpatient Hospital Stay (HOSPITAL_COMMUNITY): Payer: Medicare Other | Admitting: Occupational Therapy

## 2014-01-21 ENCOUNTER — Inpatient Hospital Stay (HOSPITAL_COMMUNITY): Payer: Medicare Other | Admitting: Physical Therapy

## 2014-01-21 ENCOUNTER — Inpatient Hospital Stay (HOSPITAL_COMMUNITY): Payer: Medicare Other

## 2014-01-21 NOTE — Progress Notes (Signed)
[place] PHYSICAL MEDICINE & REHABILITATION     PROGRESS NOTE 80 y.o. right-handed female with history of hypertension, mitral regurgitation and aortic stenosis/atrial fibrillation maintained on Pradaxa. Patient lives alone with 24-hour hired care. Presented 01/11/2014 with progressive shortness of breath and lower extremity edema as well as a 10 pound weight gain. Chest x-ray with evidence of volume overload. Noted sodium level 121 on admission. Patient was placed on intravenous Lasix. Echocardiogram with ejection fraction of 65% no wall motion abnormalities there is mild stenosis with aortic valve as well as moderate mitral regurgitation. Findings of enterococcus UTI and received one dose of fosfomycin  Subjective/Complaints: "Cold all over". Good night of sleep again. No pain c/os no SOB/CP CPAP not working last noc A 12 point review of systems has been performed and if not noted above is otherwise negative.   Objective: Vital Signs: Blood pressure 115/68, pulse 67, temperature 98.4 F (36.9 C), temperature source Axillary, resp. rate 18, height 5' 6.93" (1.7 m), weight 69.8 kg (153 lb 14.1 oz), SpO2 97.00%. No results found.  Recent Labs  01/19/14 0720  WBC 9.4  HGB 11.8*  HCT 34.9*  PLT 182    Recent Labs  01/19/14 0720  NA 131*  K 3.5*  CL 91*  GLUCOSE 93  BUN 29*  CREATININE 0.99  CALCIUM 8.4   CBG (last 3)  No results found for this basename: GLUCAP,  in the last 72 hours  Wt Readings from Last 3 Encounters:  01/21/14 69.8 kg (153 lb 14.1 oz)  01/16/14 73.392 kg (161 lb 12.8 oz)  11/17/13 71.668 kg (158 lb)    Physical Exam:  Constitutional: She is oriented to person, place, and time. She appears well-developed. No distress  HENT: oral mucosa pink and moist, dentition fair  Head: Normocephalic.  Eyes: EOM are normal.  Neck: Normal range of motion. Neck supple. No thyromegaly present.  Cardiovascular:  Cardiac rate controlled, irreg/irreg with systolic  murmur  Respiratory: Effort normal and breath sounds normal. No respiratory distress. No wheezes, rales. Using cpap GI: Soft. Bowel sounds are normal. She exhibits no distension. Non-tender Neurological: She is alert and oriented to person, place, and time. CN exam normal. Strength UE: 4/5 deltoid bicep, tricep, HI are 4+, LE's 4/5 HF, KE and 4/5 at ankles. Stocking glove sensory loss to just above the malleoli bilaterally. DTR's 1+. Cognitively displays fair insight and awareness. Doesn't initiate much  Skin: Skin is warm and dry.  Psychiatric: no lability or agitation   Assessment/Plan: 1. Functional deficits secondary to deconditioning after multiple medical issues which require 3+ hours per day of interdisciplinary therapy in a comprehensive inpatient rehab setting. Physiatrist is providing close team supervision and 24 hour management of active medical problems listed below. Physiatrist and rehab team continue to assess barriers to discharge/monitor patient progress toward functional and medical goals. FIM: FIM - Bathing Bathing Steps Patient Completed: Chest;Right Arm;Left Arm;Abdomen;Front perineal area;Right upper leg;Left upper leg;Buttocks;Left lower leg (including foot) Bathing: 4: Min-Patient completes 8-9 60f 10 parts or 75+ percent  FIM - Upper Body Dressing/Undressing Upper body dressing/undressing steps patient completed: Thread/unthread right sleeve of pullover shirt/dresss;Thread/unthread left sleeve of pullover shirt/dress;Put head through opening of pull over shirt/dress;Pull shirt over trunk;Thread/unthread right bra strap;Thread/unthread left bra strap;Hook/unhook bra Upper body dressing/undressing: 4: Min-Patient completed 75 plus % of tasks FIM - Lower Body Dressing/Undressing Lower body dressing/undressing steps patient completed: Pull underwear up/down;Pull pants up/down;Don/Doff right shoe;Thread/unthread right underwear leg;Thread/unthread left underwear leg Lower  body dressing/undressing:  3: Mod-Patient completed 50-74% of tasks  FIM - Toileting Toileting steps completed by patient: Adjust clothing prior to toileting;Performs perineal hygiene Toileting Assistive Devices: Grab bar or rail for support Toileting: 3: Mod-Patient completed 2 of 3 steps  FIM - Toilet Transfers Toilet Transfers Assistive DeviDiplomatic Services operational officerces: Grab bars Toilet Transfers: 4-To toilet/BSC: Min A (steadying Pt. > 75%);4-From toilet/BSC: Min A (steadying Pt. > 75%)  FIM - Bed/Chair Transfer Bed/Chair Transfer Assistive Devices: Arm rests;Environmental consultantWalker (rollator) Bed/Chair Transfer: 4: Chair or W/C > Bed: Min A (steadying Pt. > 75%);4: Bed > Chair or W/C: Min A (steadying Pt. > 75%)  FIM - Locomotion: Wheelchair Distance: 75 Locomotion: Wheelchair: 2: Travels 50 - 149 ft with maximal assistance (Pt: 25 - 49%) FIM - Locomotion: Ambulation Locomotion: Ambulation Assistive Devices: Walker - Rolling (R hallway hand rail and L HHA for initial trial; then rollator) Ambulation/Gait Assistance: 3: Mod assist;4: Min assist (mod A with hand rail and HHA; min A with rollator) Locomotion: Ambulation: 1: Travels less than 50 ft with moderate assistance (Pt: 50 - 74%)  Comprehension Comprehension Mode: Auditory Comprehension: 5-Follows basic conversation/direction: With no assist  Expression Expression Mode: Verbal Expression: 5-Expresses basic needs/ideas: With extra time/assistive device  Social Interaction Social Interaction: 2-Interacts appropriately 25 - 49% of time - Needs frequent redirection. (needs re-direction w/ transfers)  Problem Solving Problem Solving: 4-Solves basic 75 - 89% of the time/requires cueing 10 - 24% of the time  Memory Memory: 3-Recognizes or recalls 50 - 74% of the time/requires cueing 25 - 49% of the time Medical Problem List and Plan:  1. Deconditioning secondary to exacerbation of CHF. Monitor for any signs of fluid overload  2. DVT Prophylaxis/Anticoagulation:  SCDs. Monitor for any signs of DVT.  3. Pain Management: Neurontin 300 mg each bedtime  4. Neuropsych: This patient is capable of making decisions on her own behalf.  5. Aortic stenosis/atrial fibrillation. Continue Pradaxa as directed. Cardiac rate control. Amiodarone 200 mg daily, Toprol 12.5 mg daily and Lasix 20mg  bid  aBPs normalizing  - monitor labs   -relaxed fluid restriction to 1500cc yesterday 6. Hyponatremia. Mild monitor   7. Hypothyroidism. Synthroid   LOS (Days) 5 A FACE TO FACE EVALUATION WAS PERFORMED  Erick ColaceKIRSTEINS,Aadyn Buchheit E 01/21/2014 8:27 AM

## 2014-01-21 NOTE — Plan of Care (Signed)
Problem: RH SAFETY Goal: RH STG ADHERE TO SAFETY PRECAUTIONS W/ASSISTANCE/DEVICE STG Adhere to Safety Precautions With Assistance/Device.  Outcome: Progressing Still needs lots of step by step cues w/ trasnfers

## 2014-01-21 NOTE — Progress Notes (Signed)
Patient was placed on Home CPAP unit/settings and nasal mask.  Pt is tolerating at this time. RT will continue to monitor.

## 2014-01-21 NOTE — Progress Notes (Signed)
Occupational Therapy Session Note  Patient Details  Name: [name] MRN: [id number] Date of Birth: 10/25/1924  Today's Date: 01/21/2014 Time: 1400-1430 Time Calculation (min): 30 min  Short Term Goals: Week 1:  OT Short Term Goal 1 (Week 1): Patient will complete grooming at the sink with modified independence. OT Short Term Goal 2 (Week 1): Patient will transfers wheelchair to/from toilet via stand pivot with minimum assistance. OT Short Term Goal 3 (Week 1): Patient will complete shower transfer with minimum assistance. OT Short Term Goal 4 (Week 1): Patient will complete lower body dressing with minimum assistance. OT Short Term Goal 5 (Week 1): Patient will complete upper body dressing with setup.  Skilled Therapeutic Interventions/Progress Updates:    Pt seen for 1:1 OT session with focus on activity tolerance, safety awareness, and functional transfers. Pt received sitting in w/c asking to go to bathroom. Ambulated with min-close supervision with rollator and mod cues for safety with rollator. Pt required [facility] for positioning of LUE when transfering from toilet as she was attempting to keep hand on grab bar rather then rollator. Pt with increase fear of falling during turn changes requiring mod cues and min assist. Practiced ambulating w/c>chair approx 8 feet away to practice turns. Pt completed 4x progressing to supervision on last attempt. Pt required frequent rest breaks throughout task. Practiced walk-in shower transfer and pt required max cues and min assist secondary to increased fear of falling. Pt with decreased safety with fear and anxiety and difficult to direct. Pt agreeable to continue practicing this in later session to decrease anxiety and increase safety. At end of session pt left sitting in w/c with all needs in reach.   Therapy Documentation Precautions:  Precautions Precautions: Fall Precaution Comments: hx of falls Restrictions Weight Bearing Restrictions:  No Other Position/Activity Restrictions: hx of B TKR General: General Missed Time Reason: Other (comment) (Pt requested to eat breakfast prior to session) Vital Signs:   Pain: No report of pain during therapy session.   See FIM for current functional status  Therapy/Group: Individual Therapy  Daneil Danerkinson, Tiersa Dayley N 01/21/2014, 2:33 PM

## 2014-01-21 NOTE — Progress Notes (Signed)
Nursing Note: Alerted by NT concerned that pt is confused.Went in and assessed pt and pt alert and oriented x4.NT was concerned as pt follows directions poorly r/t safety and transfers despite constant cueing.I read noted from therapy and their documentation shows similar behavior..Checked vitals t-97.2 p-65 r-17 bp-114/69 and Po2 100% on CPAP.Pt moves all extremities well.Pt resting and denies pain.wbb

## 2014-01-21 NOTE — Progress Notes (Signed)
Occupational Therapy Session Notes  Patient Details  Name: Moody BruinsClarice V Sprenkle MRN: [id number] Date of Birth: [birth year]  Today's Date: 01/21/2014 Time: 1100-1200 and 245-330 Time Calculation (min): 60 min and 45 min  Short Term Goals: Week 1:  OT Short Term Goal 1 (Week 1): Patient will complete grooming at the sink with modified independence. OT Short Term Goal 2 (Week 1): Patient will transfers wheelchair to/from toilet via stand pivot with minimum assistance. OT Short Term Goal 3 (Week 1): Patient will complete shower transfer with minimum assistance. OT Short Term Goal 4 (Week 1): Patient will complete lower body dressing with minimum assistance. OT Short Term Goal 5 (Week 1): Patient will complete upper body dressing with setup.  Skilled Therapeutic Interventions/Progress Updates:  1)  Patient resting in w/c upon arrival.  Engaged in self care retraining to include sponge bath, dress and groom tasks.  Focused session on activity tolerance, sit><stands, BLE bath and dress to include forward weight shifts to reach or cross legs, dynamic standing balance, safety with Rollator to open door and to sequence steps to turn around and be seated on Rollator seat.  Patient often fearful of transitional movements and turning and sometimes freezes then requires min assist for safety.      2)  Patient resting in w/c upon arrival.  Engaged in simple task of watering plants while standing and changing directions.  Patient ambulated partially down the hall with Rollator then requested to sit which required vcs and min assist due to decreased ability to sequence/problem solve through the task.  Patient used BLEs to propel Rollator to day room with min assist and patient easily distracted by movement and people passing by.  Patient required mod assist to perform sit>stand from Rollator after 4 tries.  Patient performed side steps and reaching to water ~5 plants before requesting to sit down.  Patient requested  back to bed and required mod assist.  Therapy Documentation Precautions:  Precautions Precautions: Fall Precaution Comments: hx of falls Restrictions Weight Bearing Restrictions: No Other Position/Activity Restrictions: hx of B TKR Pain: Denies pain in both sessions ADL: See FIM for current functional status  Therapy/Group: Individual Therapy both sesions  Renesha Lizama 01/21/2014, 4:43 PM

## 2014-01-21 NOTE — Patient Care Conference (Signed)
Inpatient RehabilitationTeam Conference and Plan of Care Update Date: 01/21/2014   Time: 10;55 am    Patient Name: [name]      Medical Record Number: [id number]  Date of Birth: 10-Nov-1923 Sex: Female         Room/Bed: 4W20C/4W20C-01 Payor Info: Payor: MEDICARE / Plan: MEDICARE PART A AND B / Product Type: *No Product type* /    Admitting Diagnosis: Deconditioning/CHF  Admit Date/Time:  01/16/2014  4:58 PM Admission Comments: No comment available   Primary Diagnosis:  <principal problem not specified> Principal Problem: <principal problem not specified>  Patient Active Problem List   Diagnosis Date Noted  . Acute systolic heart failure 01/14/2014  . CHF (congestive heart failure) 01/12/2014  . ASD (atrial septal defect)   . Aortic stenosis, moderate   . Moderate mitral regurgitation   . HTN (hypertension) 10/27/2013  . Aortic valve disorders 10/27/2013  . Gout 10/02/2013  . Abnormality of gait 04/17/2013  . Abnormal PFT 01/28/2013  . Diabetes with neurologic complications   . Physical deconditioning 06/06/2012  . OSA (obstructive sleep apnea) 06/04/2012  . Chronic atrial fibrillation   . Diastolic heart failure, NYHA class 1   . Hypothyroidism   . Asthma     Expected Discharge Date: Expected Discharge Date: 01/24/14  Team Members Present: Physician leading conference: Dr. Claudette Laws Social Worker Present: Dossie Der, LCSW Nurse Present: Other (comment) (Misty Cornell_RN) PT Present: Wanda Plump, PT;[name] Hobble, PT OT Present: Scherrie November, OT SLP Present: Maxcine Ham, SLP PPS Coordinator present : Tora Duck, RN, CRRN     Current Status/Progress Goal Weekly Team Focus  Medical   Evidence of congestive heart failure, problems with CPAP  Maintain medical stability for discharge  Troubleshoot CPAP issues per Resptx   Bowel/Bladder     STRESS INCONTINENCE-WEARS DEPENDS   CONT WITH PLAN     Swallow/Nutrition/ Hydration     WFL         ADL's   min assist functional transfers, bathing, and UB dressing, mod assist LB dressing and toilet task, decreased activity tolerance  supervision overall, min assist dressing  activity tolerance, safety awareness, functional transfers, dynamic standing balance, UE strengthening, pt education   Mobility   Min A transfers, gait, and stair negotiation. Limited by activity tolerance, fear of falling, and decreased safety using rollator.  Supervision overall  Safe use of rollator with functional transfers and gait, stair negotiation, dynamic standing balance, activity tolerance.   Communication     WFL        Safety/Cognition/ Behavioral Observations    No unsafe behaviors        Pain     NO PAIN ISSUES        Skin     NO SKIN ISSUES WILL CONTINUE TO MONITOR           *See Care Plan and progress notes for long and short-term goals.  Barriers to Discharge: see above    Possible Resolutions to Barriers:  cont rehab    Discharge Planning/Teaching Needs:  Home with 24 hr hired caregiver at home, pt needs to be more mobile caregiver was not doing hands on care prior to admission      Team Discussion:  Making progress toward goals- PT recommends rolling walker over rollator-pt uses and likes.  Problem with CPAP machine last night-respiratory checked on.  Revisions to Treatment Plan:  None   Continued Need for Acute Rehabilitation Level of Care: The patient requires daily medical  management by a physician with specialized training in physical medicine and rehabilitation for the following conditions: Daily direction of a multidisciplinary physical rehabilitation program to ensure safe treatment while eliciting the highest outcome that is of practical value to the patient.: Yes Daily medical management of patient stability for increased activity during participation in an intensive rehabilitation regime.: Yes Daily analysis of laboratory values and/or radiology reports with any  subsequent need for medication adjustment of medical intervention for : Cardiac problems;Other  [name], [name] 01/23/2014, 2:27 PM

## 2014-01-21 NOTE — Progress Notes (Addendum)
Physical Therapy Session Note  Patient Details  Name: [name] MRN: [id number] Date of Birth: [birth year]  Today's Date: 01/21/2014 Time: 0810-0900 Time Calculation (min): 50 min  Short Term Goals: Week 1:  PT Short Term Goal 1 (Week 1): Mod I for bed mobility with rail PT Short Term Goal 2 (Week 1): bed <> chair transfers min A PT Short Term Goal 3 (Week 1): ambulation with rolling walker ~ 150 feet with min A PT Short Term Goal 4 (Week 1): ascend/descend 2 stairs with R rail and min A  Skilled Therapeutic Interventions/Progress Updates:    Pt missed 10 minutes of skilled PT secondary to pt request to eat breakfast prior to beginning therapy. Pt received supine in bed; asleep but easily awakened. Pt agreeable to session but requests to eat breakfast prior to session. Performed supine>sit with HOB flat, bed rail, supervision. Once seated EOB, performed scooting to L side x7 reps with mod A, manual facilitation of anterior weight shift with PT seated in chair anterior to pt secondary to significant pt anxiety with anterior weight shift. Multiple trials required to achieve sit>stand from (elevated) EOB to rollator secondary to pt apprehension. Performed multiple stand pivot transfers from bed<>w/c initially with min A (for sit>stand), subtle cueing provided for safe use of rollator. Final stand pivot transfer with rollator, close supervision, subtle to min verbal cueing for setup. Pt performed transfer from w/c>toilet with grab bars and mod A (secondary to pt urgency), toilet>w/c with grab bars and min A. Seated EOB, performed bilat scooting with effective within-session carryover of technique, improved anterior weight shift. Therapist departed pt room with pt seated in w/c with all needs within reach.  Addendum: Long term goals modified to supervision secondary to pt having 24-hour supervision available upon discharge.  Therapy Documentation Precautions:  Precautions Precautions:  Fall Precaution Comments: hx of falls Restrictions Weight Bearing Restrictions: No Other Position/Activity Restrictions: hx of B TKR General: Amount of Missed PT Time (min): 10 Minutes Missed Time Reason: Other (comment) (Pt requested to eat breakfast prior to session) Vital Signs: Therapy Vitals Temp: 98.4 F (36.9 C) Temp src: Axillary Pulse Rate: 67 Resp: 18 BP: 115/68 mmHg Patient Position, if appropriate: Lying Oxygen Therapy SpO2: 97 % O2 Device: CPAP Pain: Pain Assessment Pain Assessment: No/denies pain Pain Score: 0-No pain  See FIM for current functional status  Therapy/Group: Individual Therapy  Jousha Schwandt, [name] 01/21/2014, 10:51 AM

## 2014-01-21 NOTE — Progress Notes (Signed)
Social Work Patient ID: [name], female   DOB: Jun 18, 1924, 80 y.o.   MRN: [id number] Met with pt and spoke with daughter-[name] regarding team conference goals-supervision level and discharge 3/28. Pt is so grateful to be going home she states: " Thank the [employer] to resume caregivers.  Will work toward discharge on Sat.

## 2014-01-22 ENCOUNTER — Inpatient Hospital Stay (HOSPITAL_COMMUNITY): Payer: Medicare Other

## 2014-01-22 ENCOUNTER — Inpatient Hospital Stay (HOSPITAL_COMMUNITY): Payer: Medicare Other | Admitting: Physical Therapy

## 2014-01-22 DIAGNOSIS — I509 Heart failure, unspecified: Secondary | ICD-10-CM

## 2014-01-22 DIAGNOSIS — R5381 Other malaise: Secondary | ICD-10-CM

## 2014-01-22 LAB — BASIC METABOLIC PANEL
BUN: 21 mg/dL (ref 6–23)
CALCIUM: 8.7 mg/dL (ref 8.4–10.5)
CO2: 26 meq/L (ref 19–32)
Chloride: 81 mEq/L — ABNORMAL LOW (ref 96–112)
Creatinine, Ser: 0.9 mg/dL (ref 0.50–1.10)
GFR calc Af Amer: 65 mL/min — ABNORMAL LOW (ref >90)
GFR calc non Af Amer: 56 mL/min — ABNORMAL LOW (ref >90)
GLUCOSE: 197 mg/dL — AB (ref 70–99)
Potassium: 4.4 mEq/L (ref 3.7–5.3)
Sodium: 121 mEq/L — CL (ref 137–147)

## 2014-01-22 MED ORDER — SODIUM CHLORIDE 0.9 % IV SOLN
INTRAVENOUS | Status: DC
  Administered 2014-01-22 – 2014-01-23 (×2): via INTRAVENOUS

## 2014-01-22 NOTE — Progress Notes (Signed)
[facility] PHYSICAL MEDICINE & REHABILITATION     PROGRESS NOTE 80 y.o. right-handed female with history of hypertension, mitral regurgitation and aortic stenosis/atrial fibrillation maintained on Pradaxa. Patient lives alone with 24-hour hired care. Presented 01/11/2014 with progressive shortness of breath and lower extremity edema as well as a 10 pound weight gain. Chest x-ray with evidence of volume overload. Noted sodium level 121 on admission. Patient was placed on intravenous Lasix. Echocardiogram with ejection fraction of 65% no wall motion abnormalities there is mild stenosis with aortic valve as well as moderate mitral regurgitation. Findings of enterococcus UTI and received one dose of fosfomycin  Subjective/Complaints: Happy to be going home on sat. Good night of sleep again. No pain c/os no SOB/CP CPAP  working last noc A 12 point review of systems has been performed and if not noted above is otherwise negative.   Objective: Vital Signs: Blood pressure 114/71, pulse 66, temperature 97.7 F (36.5 C), temperature source Oral, resp. rate 17, height 5' 6.93" (1.7 m), weight 75 kg (165 lb 5.5 oz), SpO2 97.00%. No results found.  Recent Labs  01/19/14 0720  WBC 9.4  HGB 11.8*  HCT 34.9*  PLT 182    Recent Labs  01/19/14 0720  NA 131*  K 3.5*  CL 91*  GLUCOSE 93  BUN 29*  CREATININE 0.99  CALCIUM 8.4   CBG (last 3)  No results found for this basename: GLUCAP,  in the last 72 hours  Wt Readings from Last 3 Encounters:  01/22/14 75 kg (165 lb 5.5 oz)  01/16/14 73.392 kg (161 lb 12.8 oz)  11/17/13 71.668 kg (158 lb)    Physical Exam:  Constitutional: She is oriented to person, place, and time. She appears well-developed. No distress  HENT: oral mucosa pink and moist, dentition fair  Head: Normocephalic.  Eyes: EOM are normal.  Neck: Normal range of motion. Neck supple. No thyromegaly present.  Cardiovascular:  Cardiac rate controlled, irreg/irreg with systolic  murmur  Ext:  Tr pedal edema Respiratory: Effort normal and breath sounds normal. No respiratory distress. No wheezes, rales.  GI: Soft. Bowel sounds are normal. She exhibits no distension. Non-tender Neurological: She is alert and oriented to person, place, and time. CN exam normal. Strength UE: 4/5 deltoid bicep, tricep, HI are 4+, LE's 4/5 HF, KE and 4/5 at ankles.. Cognitively displays fair insight and awareness. Doesn't initiate much  Skin: Skin is warm and dry.  Psychiatric: no lability or agitation   Assessment/Plan: 1. Functional deficits secondary to deconditioning after multiple medical issues which require 3+ hours per day of interdisciplinary therapy in a comprehensive inpatient rehab setting. Physiatrist is providing close team supervision and 24 hour management of active medical problems listed below. Physiatrist and rehab team continue to assess barriers to discharge/monitor patient progress toward functional and medical goals. Plan D/C 3/28, d/w daughter instructions tomorrow afternoon, she can be here at 3p FIM: FIM - Bathing Bathing Steps Patient Completed: Chest;Right Arm;Left Arm;Abdomen;Front perineal area;Right upper leg;Left upper leg;Buttocks;Left lower leg (including foot) Bathing: 4: Min-Patient completes 8-9 [id number] 10 parts or 75+ percent  FIM - Upper Body Dressing/Undressing Upper body dressing/undressing steps patient completed: Thread/unthread right sleeve of pullover shirt/dresss;Thread/unthread left sleeve of pullover shirt/dress;Put head through opening of pull over shirt/dress;Pull shirt over trunk;Thread/unthread right bra strap;Thread/unthread left bra strap;Hook/unhook bra Upper body dressing/undressing: 4: Min-Patient completed 75 plus % of tasks FIM - Lower Body Dressing/Undressing Lower body dressing/undressing steps patient completed: Pull underwear up/down;Pull pants up/down;Don/Doff right shoe;Thread/unthread  right underwear leg;Thread/unthread left  underwear leg Lower body dressing/undressing: 3: Mod-Patient completed 50-74% of tasks  FIM - Toileting Toileting steps completed by patient: Adjust clothing prior to toileting;Performs perineal hygiene;Adjust clothing after toileting Toileting Assistive Devices: Grab bar or rail for support Toileting: 5: Supervision: Safety issues/verbal cues  FIM - Diplomatic Services operational officer Devices: Grab bars Toilet Transfers: 4-To toilet/BSC: Min A (steadying Pt. > 75%);4-From toilet/BSC: Min A (steadying Pt. > 75%)  FIM - Bed/Chair Transfer Bed/Chair Transfer Assistive Devices: Environmental consultant (rollator) Bed/Chair Transfer: 3: Sit > Supine: Mod A (lifting assist/Pt. 50-74%/lift 2 legs)  FIM - Locomotion: Wheelchair Distance: 75 Locomotion: Wheelchair: 0: Activity did not occur FIM - Locomotion: Ambulation Locomotion: Ambulation Assistive Devices: Designer, industrial/product (R hallway hand rail and L HHA for initial trial; then rollator) Ambulation/Gait Assistance: 3: Mod assist;4: Min assist (mod A with hand rail and HHA; min A with rollator) Locomotion: Ambulation: 0: Activity did not occur  Comprehension Comprehension Mode: Auditory Comprehension: 5-Follows basic conversation/direction: With no assist  Expression Expression Mode: Verbal Expression: 5-Expresses basic needs/ideas: With extra time/assistive device  Social Interaction Social Interaction: 2-Interacts appropriately 25 - 49% of time - Needs frequent redirection. (needs re-direction w/ transfers)  Problem Solving Problem Solving: 4-Solves basic 75 - 89% of the time/requires cueing 10 - 24% of the time  Memory Memory: 3-Recognizes or recalls 50 - 74% of the time/requires cueing 25 - 49% of the time Medical Problem List and Plan:  1. Deconditioning secondary to exacerbation of CHF. Monitor for any signs of fluid overload  2. DVT Prophylaxis/Anticoagulation: SCDs. Monitor for any signs of DVT.  3. Pain Management: Neurontin 300  mg each bedtime  4. Neuropsych: This patient is capable of making decisions on her own behalf.  5. Aortic stenosis/atrial fibrillation. Continue Pradaxa as directed. Cardiac rate control. Amiodarone 200 mg daily, Toprol 12.5 mg daily and Lasix 20mg  bid  BPs normalizing  - monitor labs   -relaxed fluid restriction to 1500cc yesterday 6. Hyponatremia. Mild monitor   7. Hypothyroidism. Synthroid   LOS (Days) 6 A FACE TO FACE EVALUATION WAS PERFORMED  Erick Colace 01/22/2014 7:14 AM

## 2014-01-22 NOTE — Progress Notes (Signed)
CRITICAL VALUE ALERT  Critical value received:  Sodium 121  Date of notification:  01-22-14  Time of notification:  1105  Critical value read back:yes  Nurse who received alert:  Oletha CruelAngela Porter Nakama, RN  MD notified (1st page):  D. Anguilli , PA  Time of first page:  1115  MD notified (2nd page):  Time of second page:  Responding MD:  D. Anguilli PA  Time MD responded:  1116

## 2014-01-22 NOTE — Progress Notes (Signed)
Physical Therapy Session Note  Patient Details  Name: [name] MRN: [id number] Date of Birth: 09-Dec-1923  Today's Date: 01/22/2014 Time: 4098-1191 and 4782-9562 Time Calculation (min): 60 min and 44 min  Short Term Goals: Week 1:  PT Short Term Goal 1 (Week 1): Mod I for bed mobility with rail PT Short Term Goal 2 (Week 1): bed <> chair transfers min A PT Short Term Goal 3 (Week 1): ambulation with rolling walker ~ 150 feet with min A PT Short Term Goal 4 (Week 1): ascend/descend 2 stairs with R rail and min A  Skilled Therapeutic Interventions/Progress Updates:    Treatment Session 1: Pt received seated in w/c with quick release belt on; RN present to administer medication. Per RN, OT noted increased confusion during B&D this AM. Pt oriented x4 with increased time. Performed toilet transfer with rollator and grab bars, min A. Decreased initiation, delayed processing (as compared with previous sessions) noted during functional mobility.   Gait x45' in controlled environment with rollator and min A prior to onset of significant gait instability (rollator positioned too far in front of pt, pt with significant anterior trunk lean. Max verbal cueing required to stop pt from sitting in unlocked rollator; Mod A required to safely repositioned pt in w/c. Once seated, pt reports onset of "dizziness" and "spinning" after quickly turning head to R side during gait. Transported pt in w/c to room secondary to symptoms. Orthostatic vitals not significant; asymptomatic. Vertebral Artery Test negative bilaterally. R [employer] reproduced pt symptoms; however, unable to visualize nystagmus secondary to pt tendency to close eyes. Pt unable to tolerate additional testing secondary to discomfort. Will consult PT specializing in vestibular rehab.  During seated rest break, therapist expressed concern regarding safety issues with rollator, such as pt difficulty remembering to lock brakes prior to  attempting to sit on rollator seat. Pt continuing to insist on utilizing personal rollator. Therapist departed with pt seated in w/c with quick release belt on and all needs within reach. RN and CSW, Kriste Basque, notified of change in pt presentation during today's session.  Treatment Session 2: Pt received seated in w/c; agreeable to session. Pt reports no dizziness/symptoms at rest. Transported pt to gym, where pt negotiated 3 steps with bilat rails, forward-facing, step-to pattern with min A to ascend, mod-total A to descend. After ascending 3 stairs, pt with increased anxiety; therefore, 6.5" step height lowered to 3" to decrease pt anxiety, facilitate safe descent. P required mod A to descend initial step, then max-total A for final 2 steps due to fear of falling. Pt denies having dizziness or spinning symptoms during/after stair negotiation.  Performed gait x44' in controlled environment with rolling walker (with max coaxing) min A and improved gait stability. After seated rest break, performed gait x20', x40' rollator. During final gait trial, pt with onset of "spinning" after quickly turning head to R side in response to noise. Transported pt to room, where session ended. Therapist departed with pt seated in w/c with quick release belt on and all needs within reach. Pt no longer dizzy and in no apparent distress.  Therapy Documentation Precautions:  Precautions Precautions: Fall Precaution Comments: hx of falls Restrictions Weight Bearing Restrictions: No Other Position/Activity Restrictions: hx of B TKR Vital Signs: Therapy Vitals Pulse Rate: 62 BP: 120/68 mmHg Pain: Pain Assessment Pain Assessment: No/denies pain Pain Score: 0-No pain Locomotion : Ambulation Ambulation/Gait Assistance: 3: Mod assist   See FIM for current functional status  Therapy/Group: Individual Therapy  Calvert CantorHobble, Deon Duer A 01/22/2014, 12:15 PM

## 2014-01-22 NOTE — Progress Notes (Signed)
Occupational Therapy Session Note  Patient Details  Name: [name] MRN: [id number] Date of Birth: 1924/01/05  Today's Date: 01/22/2014 Time: 0730-0830 and [id number] Time Calculation (min): 60 min and 26 min   Short Term Goals: Week 1:  OT Short Term Goal 1 (Week 1): Patient will complete grooming at the sink with modified independence. OT Short Term Goal 2 (Week 1): Patient will transfers wheelchair to/from toilet via stand pivot with minimum assistance. OT Short Term Goal 3 (Week 1): Patient will complete shower transfer with minimum assistance. OT Short Term Goal 4 (Week 1): Patient will complete lower body dressing with minimum assistance. OT Short Term Goal 5 (Week 1): Patient will complete upper body dressing with setup.  Skilled Therapeutic Interventions/Progress Updates:    Session 1: Pt seen for ADL retraining with focus on safety awareness, dynamic standing balance, sequencing, and activity tolerance. Pt received supine in bed requesting to go to bathroom. Pt ambulated with min assist using rollator and max cues secondary to poor sequencing with steps and fear of falling. Pt freezing at times and becoming very fearful of falling then attempting to sit with nothing behind her. Completed bathing at sink max multimodal cues for sequencing and problem solving. Pt completed sit<>stand with min-mod assist and very fearful of falling with each stand demonstrating posterior lean. Pt required external cue for anterior weight shift in standing. Pt with increased difficulty with LB dressing requiring cues for crossover technique and assist as she was fearful of falling forward in chair despite positioning of therapist and reassurance. Pt required frequent rest breaks d/t fatigue. At end of session pt left sitting in w/c with QRB donned. Therapist notified RN of increased confusion this AM.  Session 2: Pt seen for 1:1 OT session with focus on activity tolerance, safety awareness, and  functional transfers. Pt received sitting in w/c declining practicing functional transfers with RW and requesting rollator. Practiced safe transition to ambulating with rollator to sitting on rollator. Pt required mod assist during this task and total assist for safety awareness as she did not lock brakes prior to sitting and attempting to sit before being aware of placement of rollator. Discussed safety concerns however pt with no carryover. Required 6 attempts at mod-min assist to stand from rollator and safely turn so pt could return to w/c. Pt very fearful of falling and ignoring cues from therapist when she has high anxiety. Pt very confused today and RN reporting this was caused by low sodium levels. Pt placed on IVs and hope to resolve confusion by tomorrow. Pt left in w/c with QRB donned and all items in reach.   Therapy Documentation Precautions:  Precautions Precautions: Fall Precaution Comments: hx of falls Restrictions Weight Bearing Restrictions: No Other Position/Activity Restrictions: hx of B TKR General:   Vital Signs: Therapy Vitals Pulse Rate: 62 BP: 120/68 mmHg Pain: Pt with no report of pain during therapy sessions.   Other Treatments:    See FIM for current functional status  Therapy/Group: Individual Therapy  Daneil Danerkinson, Ivan Lacher N 01/22/2014, 11:47 AM

## 2014-01-23 ENCOUNTER — Inpatient Hospital Stay (HOSPITAL_COMMUNITY): Payer: Medicare Other

## 2014-01-23 ENCOUNTER — Encounter (HOSPITAL_COMMUNITY): Payer: Medicare Other

## 2014-01-23 ENCOUNTER — Inpatient Hospital Stay (HOSPITAL_COMMUNITY): Payer: Medicare Other | Admitting: Physical Therapy

## 2014-01-23 DIAGNOSIS — R5381 Other malaise: Secondary | ICD-10-CM

## 2014-01-23 DIAGNOSIS — I509 Heart failure, unspecified: Secondary | ICD-10-CM

## 2014-01-23 LAB — URINALYSIS, ROUTINE W REFLEX MICROSCOPIC
Bilirubin Urine: NEGATIVE
Glucose, UA: NEGATIVE mg/dL
HGB URINE DIPSTICK: NEGATIVE
Ketones, ur: NEGATIVE mg/dL
Nitrite: NEGATIVE
PH: 6 (ref 5.0–8.0)
Protein, ur: NEGATIVE mg/dL
SPECIFIC GRAVITY, URINE: 1.012 (ref 1.005–1.030)
Urobilinogen, UA: 1 mg/dL (ref 0.0–1.0)

## 2014-01-23 LAB — BASIC METABOLIC PANEL
BUN: 20 mg/dL (ref 6–23)
CHLORIDE: 84 meq/L — AB (ref 96–112)
CO2: 26 mEq/L (ref 19–32)
Calcium: 8.2 mg/dL — ABNORMAL LOW (ref 8.4–10.5)
Creatinine, Ser: 0.83 mg/dL (ref 0.50–1.10)
GFR calc Af Amer: 71 mL/min — ABNORMAL LOW (ref >90)
GFR, EST NON AFRICAN AMERICAN: 62 mL/min — AB (ref >90)
GLUCOSE: 99 mg/dL (ref 70–99)
Potassium: 3.9 mEq/L (ref 3.7–5.3)
Sodium: 121 mEq/L — CL (ref 137–147)

## 2014-01-23 LAB — URINE MICROSCOPIC-ADD ON

## 2014-01-23 LAB — SODIUM, URINE, RANDOM: Sodium, Ur: <20 mEq/L

## 2014-01-23 MED ORDER — FUROSEMIDE 20 MG PO TABS
20.0000 mg | ORAL_TABLET | Freq: Two times a day (BID) | ORAL | Status: DC
  Administered 2014-01-24 – 2014-01-31 (×15): 20 mg via ORAL
  Filled 2014-01-23 (×17): qty 1

## 2014-01-23 MED ORDER — FUROSEMIDE 10 MG/ML IJ SOLN
40.0000 mg | Freq: Once | INTRAMUSCULAR | Status: AC
  Administered 2014-01-23: 40 mg via INTRAVENOUS
  Filled 2014-01-23: qty 4

## 2014-01-23 NOTE — Progress Notes (Signed)
Social Work Patient ID: [name], female   DOB: 17-Apr-1924, 80 y.o.   MRN: [id number] Spoke with Dan-PA who reports pt is not medically ready to leave on Sat will look at Monday.  Have contacted daughter Lynden AngCathy to infrom her of the information. She will set Mom's caregivers for Monday.  Await medical clearance for discharge.

## 2014-01-23 NOTE — Discharge Summary (Signed)
Discharge summary job [phone]

## 2014-01-23 NOTE — Progress Notes (Signed)
Occupational Therapy Note  Patient Details  Name: [name] MRN: [id number] Date of Birth: November 04, 1923 Today's Date: 01/23/2014  Patient missing 60 min skilled OT secondary to off unit for chest x-ray.   Preston Weill, Vara GuardianKayla N 01/23/2014, 9:49 AM

## 2014-01-23 NOTE — Progress Notes (Signed)
Physical Therapy Note  Patient Details  Name: [name] MRN: [id number] Date of Birth: September 15, 1924 Today's Date: 01/23/2014  Time: 1330-1410 40 minutes  1:1 No c/o pain, pt c/o cold and fatigue.  Sit to stand to rollator with cues for brakes with mod A, pt requires multiple attempts to perform sit to stand.  Gait with rollator with min A with cues for safety, decreased cadence.  Pt able to gait 100' x 2 with prolonged rest break.  Stair negotiation with 2 handrails with mod A for lifting, frequent cues for sequencing and safety.  Sit to stands x 5 with min/mod A.  Pt with delayed processing, increased weakness today. Pt/daughter agree that it is good to delay d/c until medical issues resolve and hopefully functional decline resolves.   DONAWERTH,KAREN 01/23/2014, 2:09 PM

## 2014-01-23 NOTE — Discharge Summary (Signed)
NAMMarvis [name]:  Bowermaster, Jamille             ACCOUNT NO.:  [id number]  MEDICAL RECORD NO.:  [id number]  LOCATION:  [id number]                        FACILITY:  [facility]  PHYSICIAN:  Erick ColaceAndrew E. Lashaunda Schild, M.D.DATE OF BIRTH:  [birth year]  DATE OF ADMISSION:  01/16/2014 DATE OF DISCHARGE:  01/24/2014                              DISCHARGE SUMMARY   DISCHARGE DIAGNOSES: 1. Deconditioning secondary to exacerbation of congestive heart     failure. 2. Sequential compression devices for DVT prophylaxis.  Pain     management. 3. Aortic stenosis with atrial fibrillation. 4. Hyponatremia 5. Hypothyroidism.  HISTORY OF PRESENT ILLNESS:  This is an 80 year old right-handed female with history of hypertension, mitral regurgitation, aortic stenosis, and atrial fibrillation, maintained on Pradaxa.  The patient lives alone with 24-hour hired care.  Presented January 11, 2014, with progressive shortness of breath, lower extremity edema, as well as 10-pound weight gain.  Chest x-ray with evidence of volume overload.  Noted sodium level 121 on admission.  The patient placed on intravenous Lasix. Echocardiogram with ejection fraction of 65%.  No wall motion abnormalities.  There was mild stenosis with aortic valve as well as moderate mitral regurgitation.  Findings of Enterococcus UTI, received 1 dose of Fosfomycin.  Sodium level improved to 133.  Physical and occupational therapy ongoing.  The patient was admitted for comprehensive rehab program.  PAST MEDICAL HISTORY:  See discharge diagnoses.  SOCIAL HISTORY:  Lives alone with hired 24-hour care.  FUNCTIONAL HISTORY:  Prior to admission independent with assistive device.  FUNCTIONAL STATUS:  Upon admission to rehab services was moderate assist for limited distance with physical therapy.  Needing cues for placement of assistive device.  PHYSICAL EXAMINATION:  VITAL SIGNS:  Blood pressure 108/60, pulse 68, temperature 97.4, respirations 18. GENERAL:  This was  an alert female, oriented x3.  Pupils were round and reactive to light. CARDIAC:  Rate controlled. LUNGS:  Clear to auscultation. ABDOMEN:  Soft, nontender.  Good bowel sounds.  REHABILITATION HOSPITAL COURSE:  The patient was admitted to inpatient rehab services with therapies initiated on a 3-hour daily basis consisting of physical therapy, occupational therapy, and rehabilitation nursing.  The following issues were addressed during the patient's rehabilitation stay.  Pertaining to Ms. Hyndman's deconditioning related to exacerbation of congestive heart failure, she continued to participate with therapies with progressive gains.  Sequential compression devices in place for DVT prophylaxis.  She remained on Neurontin at bedtime for pain management, neuropathic pain with good results.  Cardiac rate remained controlled with the use of Pradaxa.  She would remain on amiodarone.  Her sodium levels remained stable with latest sodium of 131, decreased to 121.  She was placed on normal saline IV fluids and monitored. Her Lasix had recently been adjusted for some hypotension. Cardiology consult to advise regarding Lasix for hx CHF in light of hyponatremia. She remained on hormone supplement for hypothyroidism.  The patient received weekly collaborative interdisciplinary team conferences to discuss estimated length of stay, family teaching, and any barriers to her discharge.  She was ambulating in a controlled environment with a rolling walker minimal assist, max verbal cuing required to stop the patient from sitting in an unlocked wheelchair.  Moderate assist required  for safety, reposition in wheelchair.  Focus ongoing for safety awareness, dynamic standing balance, received supine to sit in bed.  Ambulate with minimal assist for her activities of daily living.  She was discharged to home with ongoing 24-hour care.  DISCHARGE MEDICATIONS: 1. Allopurinol 300 mg p.o. daily. 2. Amiodarone 200  mg p.o. daily. 3. Lipitor 10 mg p.o. daily. 4. Pradaxa 75 mg p.o. every 12 hours. 5. Neurontin 300 mg p.o. at bedtime. 6. Synthroid 88 mcg p.o. daily. 7. Metoprolol 12.5 mg p.o. daily. 8. Ditropan 10 mg daily. 9. FiberCon 1 tab twice daily. 10.Sonata 10 mg p.o. at bedtime.  DIET:  Heart-healthy diet, 1500 mL fluid restriction.  FOLLOWUP:  She would follow up with Dr. Ann Maki T. Stoneking, medical management, Dr. Claudette Laws at the outpatient rehab center as needed.  Ongoing therapies dictated as per [employer].     Mariam Dollar, P.A.   ______________________________ Erick Colace, M.D.    DA/MEDQ  D:  01/23/2014  T:  01/23/2014  Job:  [id number]  cc:   Hal T. Stoneking, M.D.

## 2014-01-23 NOTE — Progress Notes (Signed)
Physical Therapy Vestibular Assessment and Plan  Patient Details  Name: [name] MRN: [id number] Date of Birth: 12/12/1923  PT Diagnosis: Dizziness and Vertigo   Today's Date: 01/23/2014 Time: 1130-1205 Time Calculation (min): 35 min  Problem List:  Patient Active Problem List   Diagnosis Date Noted  . Acute systolic heart failure 35/32/9924  . CHF (congestive heart failure) 01/12/2014  . ASD (atrial septal defect)   . Aortic stenosis, moderate   . Moderate mitral regurgitation   . HTN (hypertension) 10/27/2013  . Aortic valve disorders 10/27/2013  . Gout 10/02/2013  . Abnormality of gait 04/17/2013  . Abnormal PFT 01/28/2013  . Diabetes with neurologic complications   . Physical deconditioning 06/06/2012  . OSA (obstructive sleep apnea) 06/04/2012  . Chronic atrial fibrillation   . Diastolic heart failure, NYHA class 1   . Hypothyroidism   . Asthma     Past Medical History:  Past Medical History  Diagnosis Date  . Hypertension   . Peripheral neuropathy   . Asthma   . High cholesterol   . Diabetes with neurologic complications   . PAD (peripheral artery disease)   . Enlarged heart   . Anginal pain 05/28/12  . Pneumonia 11/2010  . Sleep apnea     cpap  . Hypothyroidism   . Headache(784.0) 05/29/12    "recently"  . Stroke 05/2005; 08/2010    !mini"  . Personal history of gout   . Hypothyroidism   . DJD (degenerative joint disease)   . Carpal tunnel syndrome, bilateral   . Recurrent labyrinthitis   . GERD (gastroesophageal reflux disease)     S/P esophageal dilation in 1996  . Gallstone     Silent  . Fibrocystic breast changes   . Hemorrhoids     with bleeding  . Hx of adenomatous colonic polyps     Dr Sammuel Cooper  . Diverticulitis     left side  . MR (mitral regurgitation)     moderate  . Mild anemia     hemoglobin 11.8 on 06/2009  . Edema of lower extremity     Chronic  . Ischemic colitis 8/13  . Aortic stenosis, moderate   . Chronic  diastolic CHF (congestive heart failure)   . Moderate mitral regurgitation   . CKD (chronic kidney disease) stage 3, GFR 30-59 ml/min     "something on labs always say she has some problems"  . Chronic atrial fibrillation    Past Surgical History:  Past Surgical History  Procedure Laterality Date  . Tonsillectomy and adenoidectomy  1960's  . Dilation and curettage of uterus    . Vaginal hysterectomy  1970's  . Total knee arthroplasty  1970-~2002    left; right  . Cataract extraction w/ intraocular lens  implant, bilateral  ?1970's    Assessment & Plan Clinical Impression: 80 y.o. right-handed female with history of hypertension, mitral regurgitation and aortic stenosis/atrial fibrillation maintained on Pradaxa. Patient lives alone with 24-hour hired care. Presented 01/11/2014 with progressive shortness of breath and lower extremity edema as well as a 10 pound weight gain. Chest x-ray with evidence of volume overload. Pt also has h/o recurrent labyrinthitis Patient transferred to CIR on 01/16/2014 .   Patient currently reports intermittent lightheadedness with sit >stands (see orthostatics below) and vertigo with horizontal head movements. Pt denies vertigo with rolling in bed or supine>sit. Pt demonstrates more confusion today however, difficulty with following motor commands. Pt having difficulty attending to task today therefore oculomotor exam  inaccurate and inconsistent. Pt able to perform Hallpike dix to Rt/Lt and roll test however no nystagmus seen today. Given pt's current mental status and ability to perform motor commands it is possible she did not reach sufficient speed or positioning of head during vestibular testing. Pt did report vertigo with horizontal head turns.  Orthostatic BPs  Supine 98/69  Sitting 106/71  Standing 115/72   Based on findings from evaluation, pt has symptoms consistent with what appears to be either Rt hypofunction/Rt labyrinthitis flare vs. symptoms from  hyponatremia. Once hyponatremia resolved therapy will be better able to determine cause and then create adequate treatment plan for vertigo. Due to frequent labyrinthitis pt will likely benefit from gaze stabilization (VOR exercises), also if pt continues to be symptomatic of vertigo post resolution of hyponatremia VOR x 1 exercises will be best treatment. Continued monitoring of nystagmus will also be very important to see if other peripheral issues involved.   Patient will potentially benefit from skilled vestibular PT intervention to maximize safe functional mobility, minimize fall risk and decrease caregiver burden for planned discharge.     Refer to Care Plan for Long Term Goals  Recommendations for other services: None  Discharge Criteria: Patient will be discharged from PT if patient refuses treatment 3 consecutive times without medical reason, if treatment goals not met, if there is a change in medical status, if patient makes no progress towards goals or if patient is discharged from hospital.  The above assessment, treatment plan, treatment alternatives and goals were discussed and mutually agreed upon: by patient  Lahoma Rocker 01/23/2014, 12:25 PM

## 2014-01-23 NOTE — Progress Notes (Signed)
RN placed patient on her home CPAP.  Patient tolerating well at this time..Marland Kitchen

## 2014-01-23 NOTE — Progress Notes (Signed)
Occupational Therapy Session Note  Patient Details  Name: [name] MRN: [id number] Date of Birth: [birth year]  Today's Date: 01/23/2014 Time: 1130-1205 Time Calculation (min): 35 min  Short Term Goals: Week 1:  OT Short Term Goal 1 (Week 1): Patient will complete grooming at the sink with modified independence. OT Short Term Goal 2 (Week 1): Patient will transfers wheelchair to/from toilet via stand pivot with minimum assistance. OT Short Term Goal 3 (Week 1): Patient will complete shower transfer with minimum assistance. OT Short Term Goal 4 (Week 1): Patient will complete lower body dressing with minimum assistance. OT Short Term Goal 5 (Week 1): Patient will complete upper body dressing with setup.  Skilled Therapeutic Interventions/Progress Updates:    Pt seen for 1:1 OT session with focus on LB dressing, functional transfers, and standing balance. Pt received supine in bed. Immediately upon supine>sit pt with incontinent episode. Completed LB dressing sitting EOB and pt very fearful of falling with anterior weight shift, however no lob. Pt completed sit<>stand x4 throughout session progressing from min-supervision for last sit<>stand. Pt required verbal each trial for hand placement. Pt with min-supervision for standing balance as she becomes fearful of falling without support. Completed grooming tasks at sink with min cues for sequencing oral care task. At end of session pt left sitting in w/c with QRB donned and all needs in reach.   Therapy Documentation Precautions:  Precautions Precautions: Fall Precaution Comments: hx of falls Restrictions Weight Bearing Restrictions: No Other Position/Activity Restrictions: hx of B TKR General: General Amount of Missed OT Time (min): 60 Minutes Vital Signs: Therapy Vitals Pulse Rate: 80 BP: 110/68 mmHg Pain: No report of pain during therapy session.   See FIM for current functional status  Therapy/Group: Individual  Therapy  Daneil Danerkinson, Jaydrian Corpening N 01/23/2014, 12:08 PM

## 2014-01-23 NOTE — Progress Notes (Signed)
Social Work Lucy Chris, LCSW Social Worker Signed  Patient Care Conference Service date: 01/21/2014 4:07 PM  Inpatient RehabilitationTeam Conference and Plan of Care Update Date: 01/21/2014   Time: 10;55 am     Patient Name: [name]       Medical Record Number: [id number]   Date of Birth: [birth year] Sex: Female         Room/Bed: 4W20C/4W20C-01 Payor Info: Payor: MEDICARE / Plan: MEDICARE PART A AND B / Product Type: *No Product type* /   Admitting Diagnosis: Deconditioning/CHF   Admit Date/Time:  01/16/2014  4:58 PM Admission Comments: No comment available   Primary Diagnosis:  <principal problem not specified> Principal Problem: <principal problem not specified>    Patient Active Problem List     Diagnosis  Date Noted   .  Acute systolic heart failure  01/14/2014   .  CHF (congestive heart failure)  01/12/2014   .  ASD (atrial septal defect)     .  Aortic stenosis, moderate     .  Moderate mitral regurgitation     .  HTN (hypertension)  10/27/2013   .  Aortic valve disorders  10/27/2013   .  Gout  10/02/2013   .  Abnormality of gait  04/17/2013   .  Abnormal PFT  01/28/2013   .  Diabetes with neurologic complications     .  Physical deconditioning  06/06/2012   .  OSA (obstructive sleep apnea)  06/04/2012   .  Chronic atrial fibrillation     .  Diastolic heart failure, NYHA class 1     .  Hypothyroidism     .  Asthma       Expected Discharge Date: Expected Discharge Date: 01/24/14  Team Members Present: Physician leading conference: Dr. Claudette Laws Social Worker Present: Dossie Der, LCSW Nurse Present: Other (comment) (Misty Cornell_RN) PT Present: Wanda Plump, PT;[name] Hobble, PT OT Present: Scherrie November, OT SLP Present: Maxcine Ham, SLP PPS Coordinator present : Tora Duck, RN, CRRN        Current Status/Progress  Goal  Weekly Team Focus   Medical     Evidence of congestive heart failure, problems with CPAP  Maintain medical  stability for discharge  Troubleshoot CPAP issues per [facility]   Bowel/Bladder     STRESS INCONTINENCE-WEARS DEPENDS  CONT WITH PLAN     Swallow/Nutrition/ Hydration     WFL       ADL's     min assist functional transfers, bathing, and UB dressing, mod assist LB dressing and toilet task, decreased activity tolerance  supervision overall, min assist dressing  activity tolerance, safety awareness, functional transfers, dynamic standing balance, UE strengthening, pt education   Mobility     Min A transfers, gait, and stair negotiation. Limited by activity tolerance, fear of falling, and decreased safety using rollator.  Supervision overall  Safe use of rollator with functional transfers and gait, stair negotiation, dynamic standing balance, activity tolerance.   Communication     WFL       Safety/Cognition/ Behavioral Observations    No unsafe behaviors       Pain     NO PAIN ISSUES       Skin     NO SKIN ISSUES WILL CONTINUE TO MONITOR         *See Care Plan and progress notes for long and short-term goals.    Barriers to Discharge:  see above      Possible  Resolutions to Barriers:    cont rehab      Discharge Planning/Teaching Needs:    Home with 24 hr hired caregiver at home, pt needs to be more mobile caregiver was not doing hands on care prior to admission      Team Discussion:    Making progress toward goals- PT recommends rolling walker over rollator-pt uses and likes.  Problem with CPAP machine last night-respiratory checked on.   Revisions to Treatment Plan:    None    Continued Need for Acute Rehabilitation Level of Care: The patient requires daily medical management by a physician with specialized training in physical medicine and rehabilitation for the following conditions: Daily direction of a multidisciplinary physical rehabilitation program to ensure safe treatment while eliciting the highest outcome that is of practical value to the patient.: Yes Daily medical  management of patient stability for increased activity during participation in an intensive rehabilitation regime.: Yes Daily analysis of laboratory values and/or radiology reports with any subsequent need for medication adjustment of medical intervention for : Cardiac problems;Other  Lucy ChrisDupree, Taneesha Edgin G 01/23/2014, 2:27 PM          Patient ID: [name], female   DOB: Jun 20, 1924, 80 y.o.   MRN: [id number]

## 2014-01-23 NOTE — Consult Note (Signed)
Reason for Consult: hyponatremia Referring Physician:  Ebbie RidgeKirsteins  [name] is an 80 y.o. female with PMhx of HTN, valvular abnormalities with A fib.  She does have history of mild hyponatremia in 2013  but last sodium was low normal in the summer of 2014.  She presented to hospital on 3/15 with volume overload and a sodium of 121.  She was diuresed and sodium went up to 133- on 3/23 went down to 131, then was not checked until 3/26 was 121 and that is where it was today. There also seems to be a decline in her functional status over the last couple of days.  Patient is not feeling like fluid is back but weight has trended up and she does have pitting edema.  She is currently getting IVF and a dose of lasix today and is also eating lots of ice per the daughter.  She is on synthroid but has had a recent tsh that is OK- U/A showing resolving UTI but also urine sodium of under 20 ?   Trend in Creatinine: Creatinine, Ser  Date/Time Value Ref Range Status  01/23/2014  6:05 AM 0.83  0.50 - 1.10 mg/dL Final  1/44/81853/26/2015 63:1410:05 AM 0.90  0.50 - 1.10 mg/dL Final  9/70/26373/23/2015  8:587:20 AM 0.99  0.50 - 1.10 mg/dL Final  8/50/27743/22/2015  1:286:00 AM 1.21* 0.50 - 1.10 mg/dL Final  7/86/76723/20/2015  0:946:00 PM 1.44* 0.50 - 1.10 mg/dL Final  7/09/62833/20/2015  6:626:05 AM 1.07  0.50 - 1.10 mg/dL Final  9/47/65463/19/2015  5:034:23 AM 1.17* 0.50 - 1.10 mg/dL Final  5/46/56813/18/2015  2:753:40 AM 1.04  0.50 - 1.10 mg/dL Final  1/70/01743/17/2015  9:446:45 AM 1.07  0.50 - 1.10 mg/dL Final  9/67/59163/16/2015  3:844:55 AM 1.05  0.50 - 1.10 mg/dL Final  6/65/99353/15/2015 70:1710:06 PM 1.13* 0.50 - 1.10 mg/dL Final  7/93/90306/07/2013  0:924:25 PM 1.18* 0.50 - 1.10 mg/dL Final  3/30/07628/16/2013  2:636:50 AM 0.90  0.50 - 1.10 mg/dL Final  3/35/45628/15/2013  5:636:53 AM 0.86  0.50 - 1.10 mg/dL Final  8/93/73428/14/2013  8:766:55 AM 0.85  0.50 - 1.10 mg/dL Final  8/11/57268/09/2012  2:037:30 AM 0.80  0.50 - 1.10 mg/dL Final  5/5/97418/06/2012  6:385:00 AM 0.84  0.50 - 1.10 mg/dL Final  4/5/36468/05/2012  8:035:20 AM 0.85  0.50 - 1.10 mg/dL Final  2/1/22488/03/2012  2:504:45 AM 0.69  0.50 - 1.10 mg/dL Final   0/3/70488/02/2012  8:895:45 AM 0.79  0.50 - 1.10 mg/dL Final  1/6/94508/01/2012  3:885:15 AM 0.77  0.50 - 1.10 mg/dL Final  8/2/80038/12/2011  4:915:25 AM 0.86  0.50 - 1.10 mg/dL Final  7/9/15058/12/2011  6:972:27 AM 1.16* 0.50 - 1.10 mg/dL Final  9/4/80168/10/2011 55:3711:24 AM 0.97  0.50 - 1.10 mg/dL Final  4/8/27078/10/2011  8:671:33 AM 0.96  0.50 - 1.10 mg/dL Final  5/44/92017/31/2013  0:071:40 PM 1.05  0.50 - 1.10 mg/dL Final  1/21/97582/15/2012  8:324:45 AM 1.31* 0.4 - 1.2 mg/dL Final  5/49/82642/14/2012  1:586:30 AM 1.26* 0.4 - 1.2 mg/dL Final  3/09/40762/13/2012  8:085:00 AM 1.34* 0.4 - 1.2 mg/dL Final  8/11/03152/09/2011  9:456:00 AM 1.27* 0.4 - 1.2 mg/dL Final  8/59/29242/08/2011  4:627:28 PM 1.40* 0.4 - 1.2 mg/dL Final  86/38/177112/29/2011  1:658:42 AM 1.30* 0.4 - 1.2 mg/dL Final  79/03/833311/07/2010  8:324:49 AM 1.28* 0.4 - 1.2 mg/dL Final  91/9/166011/06/2010  6:005:28 AM 1.20  0.4 - 1.2 mg/dL Final  45/9/977411/05/2010  1:424:42 AM 1.04  0.4 - 1.2 mg/dL Final  39/5/320211/04/2010  6:33 AM 1.03  0.4 - 1.2 mg/dL Final  16/10/958 45:40 PM 1.13  0.4 - 1.2 mg/dL Final   Sodium  Date/Time Value Ref Range Status  01/23/2014  6:05 AM 121* 137 - 147 mEq/L Final     CRITICAL RESULT CALLED TO, READ BACK BY AND VERIFIED WITH:     KNISLEY E RN 01/23/14 0732 COSTELLO B  01/22/2014 10:05 AM 121* 137 - 147 mEq/L Final     CRITICAL RESULT CALLED TO, READ BACK BY AND VERIFIED WITH:     A.JOYCE RN 1105 01/22/14 E.GADDY  01/19/2014  7:20 AM 131* 137 - 147 mEq/L Final  01/18/2014  6:00 AM 133* 137 - 147 mEq/L Final  01/16/2014  6:00 PM 133* 137 - 147 mEq/L Final  01/16/2014  6:05 AM 133* 137 - 147 mEq/L Final  01/15/2014  4:23 AM 133* 137 - 147 mEq/L Final  01/14/2014  3:40 AM 129* 137 - 147 mEq/L Final  01/13/2014  6:45 AM 128* 137 - 147 mEq/L Final  01/12/2014  4:55 AM 125* 137 - 147 mEq/L Final  01/11/2014 10:06 PM 121* 137 - 147 mEq/L Final     CRITICAL RESULT CALLED TO, READ BACK BY AND VERIFIED WITH:     DKizzie Bane (RN) [id number] 01/11/2014 L. LOMAX  04/08/2013  4:25 PM 140  135 - 145 mEq/L Final  06/14/2012  6:50 AM 137  135 - 145 mEq/L Final  06/13/2012  6:53 AM 137  135 - 145 mEq/L Final  06/12/2012  6:55 AM  134* 135 - 145 mEq/L Final  06/10/2012  7:30 AM 136  135 - 145 mEq/L Final  06/07/2012  5:00 AM 135  135 - 145 mEq/L Final  06/06/2012  5:20 AM 136  135 - 145 mEq/L Final  06/04/2012  4:45 AM 129* 135 - 145 mEq/L Final  06/03/2012  5:45 AM 127* 135 - 145 mEq/L Final  06/02/2012  5:15 AM 133* 135 - 145 mEq/L Final  06/01/2012  5:25 AM 127* 135 - 145 mEq/L Final  05/31/2012  2:27 AM 133* 135 - 145 mEq/L Final  05/30/2012 11:24 AM 132* 135 - 145 mEq/L Final  05/30/2012  1:33 AM 133* 135 - 145 mEq/L Final  05/29/2012  1:40 PM 131* 135 - 145 mEq/L Final  12/14/2010  4:45 AM 136  135 - 145 mEq/L Final  12/13/2010  6:30 AM 140  135 - 145 mEq/L Final  12/12/2010  5:00 AM 140  135 - 145 mEq/L Final  12/11/2010  6:00 AM 139  135 - 145 mEq/L Final  12/10/2010  7:28 PM 130* 135 - 145 mEq/L Final  10/27/2010  8:42 AM 138  135 - 145 mEq/L Final  09/08/2010  4:49 AM 139  135 - 145 mEq/L Final  09/07/2010  5:28 AM 140  135 - 145 mEq/L Final  09/06/2010  4:42 AM 135  135 - 145 mEq/L Final  09/05/2010  6:33 AM 137  135 - 145 mEq/L Final  09/04/2010 11:40 PM 137  135 - 145 mEq/L Final   PMH:   Past Medical History  Diagnosis Date  . Hypertension   . Peripheral neuropathy   . Asthma   . High cholesterol   . Diabetes with neurologic complications   . PAD (peripheral artery disease)   . Enlarged heart   . Anginal pain 05/28/12  . Pneumonia 11/2010  . Sleep apnea     cpap  . Hypothyroidism   . Headache(784.0) 05/29/12    "recently"  .  Stroke 05/2005; 08/2010    !mini"  . Personal history of gout   . Hypothyroidism   . DJD (degenerative joint disease)   . Carpal tunnel syndrome, bilateral   . Recurrent labyrinthitis   . GERD (gastroesophageal reflux disease)     S/P esophageal dilation in 1996  . Gallstone     Silent  . Fibrocystic breast changes   . Hemorrhoids     with bleeding  . Hx of adenomatous colonic polyps     Dr Sherin Quarry  . Diverticulitis     left side  . MR (mitral regurgitation)     moderate  . Mild  anemia     hemoglobin 11.8 on 06/2009  . Edema of lower extremity     Chronic  . Ischemic colitis 8/13  . Aortic stenosis, moderate   . Chronic diastolic CHF (congestive heart failure)   . Moderate mitral regurgitation   . CKD (chronic kidney disease) stage 3, GFR 30-59 ml/min     "something on labs always say she has some problems"  . Chronic atrial fibrillation     PSH:   Past Surgical History  Procedure Laterality Date  . Tonsillectomy and adenoidectomy  1960's  . Dilation and curettage of uterus    . Vaginal hysterectomy  1970's  . Total knee arthroplasty  1970-~2002    left; right  . Cataract extraction w/ intraocular lens  implant, bilateral  ?1970's    Allergies:  Allergies  Allergen Reactions  . Aspirin Other (See Comments)    Tears my stomach up; "can take coated Aspirin qd without problems"  . Nexium [Esomeprazole Magnesium] Rash  . Penicillins Rash    Tolerates Rocephin    Medications:   Prior to Admission medications   Medication Sig Start Date End Date Taking? Authorizing Provider  acetaminophen (TYLENOL) 500 MG tablet Take 500 mg by mouth every 6 (six) hours as needed for pain.   Yes Historical Provider, MD  allopurinol (ZYLOPRIM) 300 MG tablet Take 300 mg by mouth daily.   Yes Historical Provider, MD  atorvastatin (LIPITOR) 10 MG tablet Take 10 mg by mouth daily.   Yes Historical Provider, MD  Calcium Carbonate-Vitamin D (CALCIUM-VITAMIN D) 600-200 MG-UNIT CAPS Take 1 tablet by mouth daily.    Yes Historical Provider, MD  ciprofloxacin (CIPRO) 250 MG tablet Take 250 mg by mouth 2 (two) times daily.   Yes Historical Provider, MD  dabigatran (PRADAXA) 75 MG CAPS Take 75 mg by mouth every 12 (twelve) hours.   Yes Historical Provider, MD  furosemide (LASIX) 40 MG tablet Take 0.5 tablets (20 mg total) by mouth daily. 01/19/14  Yes Marinda Elk, MD  gabapentin (NEURONTIN) 300 MG capsule Take 300 mg by mouth at bedtime.   Yes Historical Provider, MD  Garlic  (GARLIQUE) 400 MG TBEC Take 400 mg by mouth daily.    Yes Historical Provider, MD  Ketotifen Fumarate (ZADITOR OP) Place 1 drop into both eyes daily as needed (for dry eyes).   Yes Historical Provider, MD  L-methylfolate Calcium 7.5 MG TABS Take 1 tablet by mouth 2 (two) times daily.   Yes Historical Provider, MD  Lancets (FREESTYLE) lancets  10/27/13  Yes Historical Provider, MD  levothyroxine (SYNTHROID, LEVOTHROID) 88 MCG tablet Take 88 mcg by mouth daily.   Yes Historical Provider, MD  metoprolol succinate (TOPROL-XL) 25 MG 24 hr tablet Take 0.5 tablets (12.5 mg total) by mouth daily. 06/20/12  Yes Evlyn Kanner Love, PA-C  Multiple Vitamins-Minerals (MULTIVITAMIN  WITH MINERALS) tablet Take 1 tablet by mouth daily.   Yes Historical Provider, MD  Omega-3 Fatty Acids (FISH OIL) 1000 MG CAPS Take 1,000 mg by mouth daily.    Yes Historical Provider, MD  ONE TOUCH ULTRA TEST test strip  11/11/13  Yes Historical Provider, MD  oxybutynin (DITROPAN) 5 MG tablet Take 10 mg by mouth at bedtime.  08/26/13  Yes Historical Provider, MD  polycarbophil (FIBERCON) 625 MG tablet Take 625 mg by mouth 2 (two) times daily.   Yes Historical Provider, MD  potassium chloride (K-DUR,KLOR-CON) 10 MEQ tablet Take 10 mEq by mouth 2 (two) times daily.   Yes Historical Provider, MD  Probiotic Product (PROBIOTIC DAILY PO) Take 1 tablet by mouth daily.    Yes Historical Provider, MD  vitamin C (ASCORBIC ACID) 500 MG tablet Take 500 mg by mouth daily.   Yes Historical Provider, MD  zaleplon (SONATA) 5 MG capsule Take 10 mg by mouth at bedtime.    Yes Historical Provider, MD  amiodarone (PACERONE) 200 MG tablet Take 1 tablet (200 mg total) by mouth daily. 06/20/12 01/11/14  Jacquelynn Cree, PA-C    Discontinued Meds:   Medications Discontinued During This Encounter  Medication Reason  . furosemide (LASIX) tablet 40 mg   . furosemide (LASIX) tablet 20 mg     Social History:  reports that she has never smoked. She has never used  smokeless tobacco. She reports that she does not drink alcohol or use illicit drugs.  Family History:   Family History  Problem Relation Age of Onset  . Heart disease Mother   . Heart disease Father   . Heart disease Brother     A comprehensive review of systems was negative except for: Constitutional: positive for fatigue Cardiovascular: positive for lower extremity edema Behavioral/Psych: positive for confusion  Blood pressure 110/68, pulse 80, temperature 97.3 F (36.3 C), temperature source Axillary, resp. rate 18, height 5' 6.93" (1.7 m), weight 71.6 kg (157 lb 13.6 oz), SpO2 98.00%. General appearance: alert and fatigued Resp: clear to auscultation bilaterally Cardio: regular rate and rhythm, S1, S2 normal, no murmur, click, rub or gallop GI: soft, non-tender; bowel sounds normal; no masses,  no organomegaly Extremities: edema at least 1+ Neurologic: Grossly normal  Labs: Basic Metabolic Panel:  Recent Labs Lab 01/16/14 1800 01/18/14 0600 01/19/14 0720 01/22/14 1005 01/23/14 0605  NA 133* 133* 131* 121* 121*  K 3.7 4.5 3.5* 4.4 3.9  CL 91* 91* 91* 81* 84*  CO2 29 34* 29 26 26   GLUCOSE 164* 89 93 197* 99  BUN 31* 30* 29* 21 20  CREATININE 1.44* 1.21* 0.99 0.90 0.83  ALBUMIN 2.3*  --   --   --   --   CALCIUM 8.1* 8.7 8.4 8.7 8.2*   Liver Function Tests:  Recent Labs Lab 01/16/14 1800  AST 62*  ALT 40*  ALKPHOS 110  BILITOT 0.8  PROT 6.1  ALBUMIN 2.3*   No results found for this basename: LIPASE, AMYLASE,  in the last 168 hours No results found for this basename: AMMONIA,  in the last 168 hours CBC:  Recent Labs Lab 01/19/14 0720  WBC 9.4  NEUTROABS 6.6  HGB 11.8*  HCT 34.9*  MCV 97.8  PLT 182   PT/INR: @labrcntip (inr:5) Cardiac Enzymes: No results found for this basename: CKTOTAL, CKMB, CKMBINDEX, TROPONINI,  in the last 168 hours CBG: No results found for this basename: GLUCAP,  in the last 168 hours  Iron Studies:  No results found for  this basename: IRON, TIBC, TRANSFERRIN, FERRITIN,  in the last 168 hours  Xrays/Other Studies: Dg Chest 2 View  01/23/2014   CLINICAL DATA:  Chronic congestive heart failure now with shortness of breath.  EXAM: CHEST  2 VIEW  COMPARISON:  DG CHEST 1V PORT dated 01/11/2014  FINDINGS: The lungs are mildly hypoinflated. The pulmonary interstitial markings are increased. The pulmonary vascularity is engorged and indistinct. The cardiopericardial silhouette is enlarged. The left hemidiaphragm is largely obscured. The observed portions of the bony thorax exhibit no acute abnormalities.  IMPRESSION: The findings are consistent with congestive heart failure with pulmonary interstitial edema and a moderate size left pleural effusion. There has been deterioration in the appearance of the chest since the previous study.   Electronically Signed   By: David  [place]   On: 01/23/2014 10:07     Assessment/Plan: 80 year old WF with hyponatremia 1. Hyponatremia- initially seemed to be due to volume overload and corrected nicely with diuresis.  Now, does show evidence again of volume overload (edema on exam, inc weight) but also not (urine sodium less than 20).  Does not seem dry to me so will stop IVF, given lasix today which I agree with and have started scheduled PO dosing starting tomorrow.  I am going to check cortisol and check serum osms to look for evidence of SIADH which she could have as well complicating the issue.  2. UTI- most recent U/A looks like it is resolving- is asymptomatic.  Will await this next culture to see if any more treatment needed.   Thank you for consult, will continue to follow this patient with you  Aspasia Rude A 01/23/2014, 2:27 PM

## 2014-01-23 NOTE — Progress Notes (Signed)
Sodium level is again with followup labs showed 121. Patient had recently been on Lasix 40 twice a day decreased to 20 twice a day due to some hypotension. Normal saline IV fluids now had a chest x-ray pending. Consult renal services in regards to hyponatremia. Discharge scheduled for 01/24/2014 has been canceled

## 2014-01-23 NOTE — Progress Notes (Signed)
[facility]     PROGRESS NOTE 80 y.o. right-handed female with history of hypertension, mitral regurgitation and aortic stenosis/atrial fibrillation maintained on Pradaxa. Patient lives alone with 24-hour hired care. Presented 01/11/2014 with progressive shortness of breath and lower extremity edema as well as a 10 pound weight gain. Chest x-ray with evidence of volume overload. Noted sodium level 121 on admission. Patient was placed on intravenous Lasix. Echocardiogram with ejection fraction of 65% no wall motion abnormalities there is mild stenosis with aortic valve as well as moderate mitral regurgitation. Findings of enterococcus UTI and received one dose of fosfomycin  Subjective/Complaints: More confused yesterday with some decline in mobility Denies SOB, Denies CP CPAP  working last noc A 12 point review of systems has been performed and if not noted above is otherwise negative.   Objective: Vital Signs: Blood pressure 114/76, pulse 83, temperature 97.3 F (36.3 C), temperature source Axillary, resp. rate 18, height 5' 6.93" (1.7 m), weight 71.6 kg (157 lb 13.6 oz), SpO2 98.00%. No results found. No results found for this basename: WBC, HGB, HCT, PLT,  in the last 72 hours  Recent Labs  01/22/14 1005  NA 121*  K 4.4  CL 81*  GLUCOSE 197*  BUN 21  CREATININE 0.90  CALCIUM 8.7   CBG (last 3)  No results found for this basename: GLUCAP,  in the last 72 hours  Wt Readings from Last 3 Encounters:  01/23/14 71.6 kg (157 lb 13.6 oz)  01/16/14 73.392 kg (161 lb 12.8 oz)  11/17/13 71.668 kg (158 lb)    Physical Exam:  Constitutional: She is oriented to person, place, and time. She appears well-developed. No distress  HENT: oral mucosa pink and moist, dentition fair  Head: Normocephalic.  Eyes: EOM are normal.  Neck: Normal range of motion. Neck supple. No thyromegaly present.  Cardiovascular:  Cardiac rate controlled, irreg/irreg with  systolic murmur  Ext:  Tr pedal edema Respiratory: Effort normal and breath sounds normal. No respiratory distress. No wheezes,R basilar rales.  GI: Soft. Bowel sounds are normal. She exhibits no distension. Non-tender Neurological: She is alert and oriented to person, place, and time. CN exam normal. Strength UE: 4/5 deltoid bicep, tricep, HI are 4+, LE's 4/5 HF, KE and 4/5 at ankles.. Cognitively displays fair insight and awareness. Doesn't initiate much  Skin: Skin is warm and dry.  Psychiatric: no lability or agitation   Assessment/Plan: 1. Functional deficits secondary to deconditioning after multiple medical issues which require 3+ hours per day of interdisciplinary therapy in a comprehensive inpatient rehab setting. Physiatrist is providing close team supervision and 24 hour management of active medical problems listed below. Physiatrist and rehab team continue to assess barriers to discharge/monitor patient progress toward functional and medical goals. Plan D/C 3/28, will need CXR, give IV lasix this am, recheck BMET, ask cardiology to eval (Dr Mayford Knife sees her as outpt) FIM: FIM - Bathing Bathing Steps Patient Completed: Chest;Right Arm;Left Arm;Abdomen;Front perineal area;Right upper leg;Left upper leg;Buttocks Bathing: 4: Min-Patient completes 8-9 56f 10 parts or 75+ percent  FIM - Upper Body Dressing/Undressing Upper body dressing/undressing steps patient completed: Thread/unthread right sleeve of pullover shirt/dresss;Thread/unthread left sleeve of pullover shirt/dress;Put head through opening of pull over shirt/dress;Pull shirt over trunk;Thread/unthread right bra strap;Thread/unthread left bra strap;Hook/unhook bra Upper body dressing/undressing: 4: Min-Patient completed 75 plus % of tasks FIM - Lower Body Dressing/Undressing Lower body dressing/undressing steps patient completed: Pull underwear up/down;Pull pants up/down;Thread/unthread left underwear leg;Don/Doff right  shoe Lower body dressing/undressing: 3: Mod-Patient completed 50-74% of tasks  FIM - Toileting Toileting steps completed by patient: Performs perineal hygiene;Adjust clothing prior to toileting Toileting Assistive Devices: Grab bar or rail for support Toileting: 3: Mod-Patient completed 2 of 3 steps  FIM - Diplomatic Services operational officerToilet Transfers Toilet Transfers Assistive Devices: Grab bars;Walker Toilet Transfers: 4-To toilet/BSC: Min A (steadying Pt. > 75%);4-From toilet/BSC: Min A (steadying Pt. > 75%)  FIM - Bed/Chair Transfer Bed/Chair Transfer Assistive Devices: Therapist, occupationalWalker Bed/Chair Transfer: 4: Supine > Sit: Min A (steadying Pt. > 75%/lift 1 leg);4: Sit > Supine: Min A (steadying pt. > 75%/lift 1 leg);4: Bed > Chair or W/C: Min A (steadying Pt. > 75%);4: Chair or W/C > Bed: Min A (steadying Pt. > 75%)  FIM - Locomotion: Wheelchair Distance: 75 Locomotion: Wheelchair: 0: Activity did not occur FIM - Locomotion: Ambulation Locomotion: Ambulation Assistive Devices: Designer, industrial/productWalker - Rolling (rollator) Ambulation/Gait Assistance: 3: Mod assist Locomotion: Ambulation: 1: Travels less than 50 ft with moderate assistance (Pt: 50 - 74%)  Comprehension Comprehension Mode: Auditory Comprehension: 5-Follows basic conversation/direction: With no assist  Expression Expression Mode: Verbal Expression: 5-Expresses basic needs/ideas: With extra time/assistive device  Social Interaction Social Interaction: 2-Interacts appropriately 25 - 49% of time - Needs frequent redirection.  Problem Solving Problem Solving: 4-Solves basic 75 - 89% of the time/requires cueing 10 - 24% of the time  Memory Memory: 3-Recognizes or recalls 50 - 74% of the time/requires cueing 25 - 49% of the time Medical Problem List and Plan:  1. Deconditioning secondary to exacerbation of CHF. Monitor for any signs of fluid overload  2. DVT Prophylaxis/Anticoagulation: SCDs. Monitor for any signs of DVT.  3. Pain Management: Neurontin 300 mg each  bedtime  4. Neuropsych: This patient is capable of making decisions on her own behalf.  5. Aortic stenosis/atrial fibrillation. Continue Pradaxa as directed. Cardiac rate control. Amiodarone 200 mg daily, Toprol 12.5 mg daily and Lasix 20mg  bid  BPs normalizing  - monitor labs   -relaxed fluid restriction to 1500cc yesterday 6. Hyponatremia. Likely related to fluid status/CHF +/- furosemide, reviewed medication list, none of these meds causes hypoNa Down to 121 will recheck after IV .9NS   7. Hypothyroidism. Synthroid   LOS (Days) 7 A FACE TO FACE EVALUATION WAS PERFORMED  Erick ColaceKIRSTEINS,ANDREW E 01/23/2014 7:27 AM

## 2014-01-24 ENCOUNTER — Inpatient Hospital Stay (HOSPITAL_COMMUNITY): Payer: Medicare Other | Admitting: Physical Therapy

## 2014-01-24 DIAGNOSIS — E1149 Type 2 diabetes mellitus with other diabetic neurological complication: Secondary | ICD-10-CM

## 2014-01-24 DIAGNOSIS — I359 Nonrheumatic aortic valve disorder, unspecified: Secondary | ICD-10-CM

## 2014-01-24 DIAGNOSIS — R5381 Other malaise: Secondary | ICD-10-CM

## 2014-01-24 DIAGNOSIS — I5021 Acute systolic (congestive) heart failure: Secondary | ICD-10-CM

## 2014-01-24 DIAGNOSIS — I10 Essential (primary) hypertension: Secondary | ICD-10-CM

## 2014-01-24 LAB — OSMOLALITY, URINE: Osmolality, Ur: 251 mOsm/kg — ABNORMAL LOW (ref 390–1090)

## 2014-01-24 LAB — BASIC METABOLIC PANEL
BUN: 20 mg/dL (ref 6–23)
CHLORIDE: 83 meq/L — AB (ref 96–112)
CO2: 29 mEq/L (ref 19–32)
Calcium: 8.7 mg/dL (ref 8.4–10.5)
Creatinine, Ser: 0.91 mg/dL (ref 0.50–1.10)
GFR calc Af Amer: 64 mL/min — ABNORMAL LOW (ref >90)
GFR calc non Af Amer: 55 mL/min — ABNORMAL LOW (ref >90)
Glucose, Bld: 171 mg/dL — ABNORMAL HIGH (ref 70–99)
Potassium: 4.3 mEq/L (ref 3.7–5.3)
Sodium: 124 mEq/L — ABNORMAL LOW (ref 137–147)

## 2014-01-24 LAB — CORTISOL: Cortisol, Plasma: 22.7 ug/dL

## 2014-01-24 NOTE — Progress Notes (Signed)
Subjective:  UOP not recorded- urine osm not collected and chemistries not done this AM either - cortisol was checked and is not low Objective Vital signs in last 24 hours: Filed Vitals:   01/23/14 0616 01/23/14 0847 01/23/14 1505 01/24/14 0540  BP: 114/76 110/68 127/83 130/83  Pulse: 83 80 66 70  Temp: 97.3 F (36.3 C)  96.7 F (35.9 C) 97.4 F (36.3 C)  TempSrc: Axillary  Oral Oral  Resp: 18  18 18   Height:      Weight:    73.1 kg (161 lb 2.5 oz)  SpO2: 98%  96% 99%   Weight change: 1.5 kg (3 lb 4.9 oz)  Intake/Output Summary (Last 24 hours) at 01/24/14 1610 Last data filed at 01/23/14 1800  Gross per 24 hour  Intake    460 ml  Output      0 ml  Net    460 ml    Assessment/Plan: 80 year old WF with hyponatremia  1. Hyponatremia- initially seemed to be due to volume overload and corrected nicely with diuresis. Now, does show evidence again of volume overload.  Have stopped IVF,and started scheduled PO lasix dosing.  cortisol was OK awaiting urine osms to look for evidence of SIADH which she could have as well complicating the issue. Have informed nursing to investigate the urine osm and the BMP for this AM 2. UTI- most recent U/A looks like it is resolving- is asymptomatic. Will await this next culture to see if any more treatment needed.     Charlotte Fidalgo A    Labs: Basic Metabolic Panel:  Recent Labs Lab 01/19/14 0720 01/22/14 1005 01/23/14 0605  NA 131* 121* 121*  K 3.5* 4.4 3.9  CL 91* 81* 84*  CO2 29 26 26   GLUCOSE 93 197* 99  BUN 29* 21 20  CREATININE 0.99 0.90 0.83  CALCIUM 8.4 8.7 8.2*   Liver Function Tests: No results found for this basename: AST, ALT, ALKPHOS, BILITOT, PROT, ALBUMIN,  in the last 168 hours No results found for this basename: LIPASE, AMYLASE,  in the last 168 hours No results found for this basename: AMMONIA,  in the last 168 hours CBC:  Recent Labs Lab 01/19/14 0720  WBC 9.4  NEUTROABS 6.6  HGB 11.8*  HCT 34.9*  MCV  97.8  PLT 182   Cardiac Enzymes: No results found for this basename: CKTOTAL, CKMB, CKMBINDEX, TROPONINI,  in the last 168 hours CBG: No results found for this basename: GLUCAP,  in the last 168 hours  Iron Studies: No results found for this basename: IRON, TIBC, TRANSFERRIN, FERRITIN,  in the last 72 hours Studies/Results: Dg Chest 2 View  01/23/2014   CLINICAL DATA:  Chronic congestive heart failure now with shortness of breath.  EXAM: CHEST  2 VIEW  COMPARISON:  DG CHEST 1V PORT dated 01/11/2014  FINDINGS: The lungs are mildly hypoinflated. The pulmonary interstitial markings are increased. The pulmonary vascularity is engorged and indistinct. The cardiopericardial silhouette is enlarged. The left hemidiaphragm is largely obscured. The observed portions of the bony thorax exhibit no acute abnormalities.  IMPRESSION: The findings are consistent with congestive heart failure with pulmonary interstitial edema and a moderate size left pleural effusion. There has been deterioration in the appearance of the chest since the previous study.   Electronically Signed   By: David  [place]   On: 01/23/2014 10:07   Medications: Infusions:    Scheduled Medications: . allopurinol  300 mg Oral Daily  . amiodarone  200 mg Oral Daily  . atorvastatin  10 mg Oral Daily  . dabigatran  75 mg Oral Q12H  . furosemide  20 mg Oral BID  . gabapentin  300 mg Oral QHS  . levothyroxine  88 mcg Oral QAC breakfast  . metoprolol succinate  12.5 mg Oral Daily  . oxybutynin  10 mg Oral QHS  . polycarbophil  625 mg Oral BID  . zaleplon  10 mg Oral QHS    have reviewed scheduled and prn medications.  Physical Exam: General: alert, sitting up eating breakfast, looks a little brighter today Heart: RRR Lungs: mostly clear Abdomen: benign Extremities: pitting edema    01/24/2014,9:24 AM  LOS: 8 days

## 2014-01-24 NOTE — Progress Notes (Signed)
Patient ID: Moody Bruins, female   DOB: 1924/05/10, 80 y.o.   MRN: [id number]    [facility]     PROGRESS NOTE  01/24/14.    80 year old patient admitted to the rehabilitation service following deconditioning from  decompensated  heart failure and multiple medical problems.  She has a history of chronic atrial fibrillation and has been maintained on Pradaxa.  Medical problems include OSA diabetes with neurological complications, hypertension.  She has a history of aortic stenosis, as well as ASD.  Patient has been treated for a enterococcal UTI.   Subjective/Complaints:  Denies SOB, Denies CP CPAP  working last noc A 12 point review of systems has been performed and if not noted above is otherwise negative.   Objective: Vital Signs: Blood pressure 130/83, pulse 70, temperature 97.4 F (36.3 C), temperature source Oral, resp. rate 18, height 5' 6.93" (1.7 m), weight 73.1 kg (161 lb 2.5 oz), SpO2 99.00%. Dg Chest 2 View  01/23/2014   CLINICAL DATA:  Chronic congestive heart failure now with shortness of breath.  EXAM: CHEST  2 VIEW  COMPARISON:  DG CHEST 1V PORT dated 01/11/2014  FINDINGS: The lungs are mildly hypoinflated. The pulmonary interstitial markings are increased. The pulmonary vascularity is engorged and indistinct. The cardiopericardial silhouette is enlarged. The left hemidiaphragm is largely obscured. The observed portions of the bony thorax exhibit no acute abnormalities.  IMPRESSION: The findings are consistent with congestive heart failure with pulmonary interstitial edema and a moderate size left pleural effusion. There has been deterioration in the appearance of the chest since the previous study.   Electronically Signed   By: David  [place]   On: 01/23/2014 10:07   No results found for this basename: WBC, HGB, HCT, PLT,  in the last 72 hours  Recent Labs  01/22/14 1005 01/23/14 0605  NA 121* 121*  K 4.4 3.9  CL 81* 84*  GLUCOSE  197* 99  BUN 21 20  CREATININE 0.90 0.83  CALCIUM 8.7 8.2*   CBG (last 3)  No results found for this basename: GLUCAP,  in the last 72 hours  Wt Readings from Last 3 Encounters:  01/24/14 73.1 kg (161 lb 2.5 oz)  01/16/14 73.392 kg (161 lb 12.8 oz)  11/17/13 71.668 kg (158 lb)    Patient Vitals for the past 24 hrs:  BP Temp Temp src Pulse Resp SpO2 Weight  01/24/14 0540 130/83 mmHg 97.4 F (36.3 C) Oral 70 18 99 % 73.1 kg (161 lb 2.5 oz)  01/23/14 1505 127/83 mmHg 96.7 F (35.9 C) Oral 66 18 96 % -     Intake/Output Summary (Last 24 hours) at 01/24/14 1034 Last data filed at 01/23/14 1800  Gross per 24 hour  Intake    460 ml  Output      0 ml  Net    460 ml     Physical Exam:  Constitutional: She is oriented to person, place, and time. She appears well-developed. No distress  Remains weak HENT: oral mucosa pink and moist, dentition fair  Head: Normocephalic.  Eyes: EOM are normal.  Neck: Normal range of motion. Neck supple. No thyromegaly present.  Cardiovascular:  Cardiac rate controlled, irreg/irreg with  grade 3/6 systolic murmur  Ext:  Tr pedal edema Respiratory: Effort normal and breath sounds normal. No respiratory distress. No wheezes,R basilar rales.  GI: Soft. Bowel sounds are normal. She exhibits no distension. Non-tender Neurological: She is alert and oriented to person,  place, and time. CN exam normal.  Generally weak Skin: Skin is warm and dry.  Psychiatric: no lability or agitation   Assessment/Plan: 1. Functional deficits secondary to deconditioning after multiple medical issues which require 3+ hours per day of interdisciplinary therapy in a comprehensive inpatient rehab setting.  2. DVT Prophylaxis/Anticoagulation: SCDs. Monitor for any signs of DVT.  3. Pain Management: Neurontin 300 mg each bedtime   4.  Aortic stenosis/atrial fibrillation. Continue Pradaxa as directed. Cardiac rate control. Amiodarone 200 mg daily, Toprol 12.5 mg daily and  Lasix 20mg  bid  BPs normalizing  - monitor labs   -relaxed fluid restriction to 1500cc yesterday 5.  Hyponatremia. Likely related to fluid status/CHF +/- furosemide, reviewed medication list, none of these meds causes hypoNa Down to 121 will recheck after IV .9NS   6. Hypothyroidism. Synthroid   LOS (Days) 8 A FACE TO FACE EVALUATION WAS PERFORMED  Rogelia BogaKWIATKOWSKI,Nelson Julson FRANK 01/24/2014 10:27 AM   K. CMR  7 post

## 2014-01-24 NOTE — Progress Notes (Signed)
Physical Therapy Weekly Progress Note  Patient Details  Name: [name] MRN: [id number] Date of Birth: [birth year]  Today's Date: 01/24/2014 Time: 1027-2536 Time Calculation (min): 60 min  Patient has met 1 of 4 short term goals.  Goal progress limited by decreased sustained attention and difficulty following motor commands associated with hyponatremia. Pt evaluated by PT specializing in vestibular rehabilitation secondary to ongoing pt c/o dizziness without evidence of orthostatic hypotension. If pt's attention improves, will attempt to incorporate recommendations from evaluating therapist to decrease dizziness with functional mobility.  Patient continues to demonstrate the following deficits: postural/gait instability, generalized weakness, dizziness, decreased sustained attention, and limited stability/independence with functional mobility and therefore will continue to benefit from skilled PT intervention to enhance overall performance with activity tolerance, balance, postural control, ability to compensate for deficits and attention.  Patient progressing toward goals addressing bed mobility, functional transfers, and ambulation. Pt with decreased stability/independence with stair negotiation secondary to increased anxiety, symptoms of dizziness with stair negotiation. Therefore, long term goal for stair negotiation downgraded to min A.  PT Short Term Goals Week 1:  PT Short Term Goal 1 (Week 1): Mod I for bed mobility with rail PT Short Term Goal 1 - Progress (Week 1): Progressing toward goal PT Short Term Goal 2 (Week 1): bed <> chair transfers min A PT Short Term Goal 2 - Progress (Week 1): Met PT Short Term Goal 3 (Week 1): ambulation with rolling walker ~ 150 feet with min A PT Short Term Goal 3 - Progress (Week 1): Discontinued (comment) (Downgraded LTG distance to 72' secondary to decreased activity tolerance) PT Short Term Goal 4 (Week 1): ascend/descend 2 stairs with R rail  and min A PT Short Term Goal 4 - Progress (Week 1): Not progressing Week 2:  PT Short Term Goal 1 (Week 2): STG's=LTG's secondary to ELOS  Skilled Therapeutic Interventions/Progress Updates:    Pt received seated in w/c with quick release belt on; agreeable to session with min coaxing. Oriented x4 with increased time, use of visual aid to orient self to date. Per pt request to urinate, performed toilet transfer with min A using grab bars. Pt continent of urine at this time, but brief noted to be soiled with urine. Changed brief and performed peri care; RN notified. Per pt request, donned bra and sweatshirt. Donned Ted hose and shoes. Transported pt to gym, where pt negotiated 2 steps with bilat rails. Ascended forward-facing with step-to pattern and min A. Initially attempted to descend forward-facing; due to onset of significant anxiety, however, pt unable to initiate stair descend forward. Therefore, transitioned to descending 2 stairs sideways with bilat UE support at R rail with max A for initial step and manual facilitation of bilat weight shifting to initiate bilat LE advancement. Required +2A for RLE placement on floor secondary to pt inability to move RLE from final step. Max multimodal cueing provided throughout with minimal pt return demonstration due to pt anxiety.  After seated rest break, performed stand pivot transfer from w/c<>mat table with min A; several attempts required. Seated EOM, performed bilat scooting with focus on anterior weight shifting. Pt with increased dizziness, anxiety during activity. Attempted to utilize visual fixation on target during anterior weight shifting to decrease fear of falling forward; however, pt unable to attend to task. Transported pt in w/c to room secondary to pt fatigue. Once in pt room, performed gait x15' in controlled environment with rollator and min guard. Therapist departed with pt seated in w/c  with quick release belt on and all needs within reach.  Pt reporting no dizziness and in no apparent distress.  Therapy Documentation Precautions:  Precautions Precautions: Fall Precaution Comments: hx of falls Restrictions Weight Bearing Restrictions: No Other Position/Activity Restrictions: hx of B TKR Pain: Pain Assessment Pain Assessment: No/denies pain Pain Score: 0-No pain Locomotion : Ambulation Ambulation/Gait Assistance: 4: Min guard   See FIM for current functional status  Therapy/Group: Individual Therapy  Michell Kader, Malva Cogan 01/24/2014, 12:15 PM

## 2014-01-25 ENCOUNTER — Inpatient Hospital Stay (HOSPITAL_COMMUNITY): Payer: Medicare Other | Admitting: *Deleted

## 2014-01-25 ENCOUNTER — Inpatient Hospital Stay (HOSPITAL_COMMUNITY): Payer: Medicare Other

## 2014-01-25 DIAGNOSIS — I509 Heart failure, unspecified: Secondary | ICD-10-CM

## 2014-01-25 LAB — BASIC METABOLIC PANEL
BUN: 23 mg/dL (ref 6–23)
CALCIUM: 8.4 mg/dL (ref 8.4–10.5)
CO2: 28 meq/L (ref 19–32)
Chloride: 83 mEq/L — ABNORMAL LOW (ref 96–112)
Creatinine, Ser: 1.06 mg/dL (ref 0.50–1.10)
GFR calc Af Amer: 53 mL/min — ABNORMAL LOW (ref >90)
GFR calc non Af Amer: 46 mL/min — ABNORMAL LOW (ref >90)
Glucose, Bld: 109 mg/dL — ABNORMAL HIGH (ref 70–99)
Potassium: 4.4 mEq/L (ref 3.7–5.3)
SODIUM: 121 meq/L — AB (ref 137–147)

## 2014-01-25 LAB — URINE CULTURE

## 2014-01-25 MED ORDER — SODIUM CHLORIDE 0.9 % IV SOLN
INTRAVENOUS | Status: DC
  Administered 2014-01-25: 75 mL/h via INTRAVENOUS

## 2014-01-25 NOTE — Progress Notes (Signed)
Physical Therapy Session Note  Patient Details  Name: [name] MRN: [id number] Date of Birth: [birth year]  Today's Date: 01/25/2014 Time: 1100-1151 Time Calculation (min): 51 min    Skilled Therapeutic Interventions/Progress Updates:  At the beginning of the session patient in the bathroom with aid, mod. A to transfer to the w/c off the commode, standing at the sink to wash hands with therapist with min A and cues to assure safety and increase patients comfort with activity. Gait training 2 x 30 feet with modA and RW, close w/c follow, patient present with short steps and poor trunk and hip rotation, difficulty clearing the floor. On third attempt after about 12 feet patient lost her balance in retrograde manner and has been lowered to the w/c  To sit. No rigthening reaction present during LOB episode. Patient in the gym needed increased time to rest and agree to participate in further tx. Step ups and down on one step with B Rails 2 x2 a1 x1 with max A , patient is fearful ,and reports feeling very weak and unable to continue session.Patient returned to room, wished to transfer to bed, not able ,even with assist of 2 people ,nursing notified , patient decided to stay in a w/c with her daughter present in the room and nurse informed..  BP 139/85 Hr 76 SaO2 96%  Therapy Documentation Precautions:  Precautions Precautions: Fall Precaution Comments: hx of falls Restrictions Weight Bearing Restrictions: No Other Position/Activity Restrictions: hx of B TKR  See FIM for current functional status  Therapy/Group: Individual Therapy  [name], [name] 01/25/2014, 11:52 AM

## 2014-01-25 NOTE — Progress Notes (Signed)
Patient ID: [name], female   DOB: Nov 09, 1923, 80 y.o.   MRN: [id number]   Patient ID: [name], female   DOB: Nov 09, 1923, 80 y.o.   MRN: [id number]    [facility]     PROGRESS NOTE  01/25/14.    80 year old patient admitted to the rehabilitation service following deconditioning from  decompensated  heart failure and multiple medical problems.  She has a history of chronic atrial fibrillation and has been maintained on Pradaxa.  Medical problems include OSA diabetes with neurological complications, hypertension.  She has a history of aortic stenosis, as well as ASD.  Patient has been treated for a enterococcal UTI.  Serum sodium back down to 121 after improving to 124 yesterday. Admits to a very liberal fluid intake   Subjective/Complaints:  Denies SOB, Denies CP CPAP  working last noc A 12 point review of systems has been performed and if not noted above is otherwise negative.   Objective: Vital Signs: Blood pressure 119/74, pulse 72, temperature 97.8 F (36.6 C), temperature source Oral, resp. rate 18, height 5' 6.93" (1.7 m), weight 74.4 kg (164 lb 0.4 oz), SpO2 93.00%. No results found. No results found for this basename: WBC, HGB, HCT, PLT,  in the last 72 hours  Recent Labs  01/24/14 1007 01/25/14 0455  NA 124* 121*  K 4.3 4.4  CL 83* 83*  GLUCOSE 171* 109*  BUN 20 23  CREATININE 0.91 1.06  CALCIUM 8.7 8.4   CBG (last 3)  No results found for this basename: GLUCAP,  in the last 72 hours  Wt Readings from Last 3 Encounters:  01/25/14 74.4 kg (164 lb 0.4 oz)  01/16/14 73.392 kg (161 lb 12.8 oz)  11/17/13 71.668 kg (158 lb)    Patient Vitals for the past 24 hrs:  BP Temp Temp src Pulse Resp SpO2 Weight  01/25/14 0500 119/74 mmHg 97.8 F (36.6 C) Oral 72 18 93 % 74.4 kg (164 lb 0.4 oz)  01/24/14 1500 108/82 mmHg 97.4 F (36.3 C) Oral 68 18 96 % -     Intake/Output Summary (Last 24 hours) at 01/25/14  0954 Last data filed at 01/24/14 1945  Gross per 24 hour  Intake    840 ml  Output      0 ml  Net    840 ml     Physical Exam:  Constitutional: She is oriented to person, place, and time. She appears well-developed. No distress  Remains weak HENT: oral mucosa pink and moist, dentition fair  Head: Normocephalic.  Eyes: EOM are normal.  Neck: Normal range of motion. Neck supple. No thyromegaly present.  Cardiovascular:  Cardiac rate controlled, irreg/irreg with  grade 3/6 systolic murmur  Ext: +2  pedal edema (sl worsening c/t 3/28) Respiratory: Effort normal and breath sounds normal. No respiratory distress. No wheezes,R basilar rales.  GI: Soft. Bowel sounds are normal. She exhibits no distension. Non-tender Neurological: She is alert and oriented to person, place, and time. CN exam normal.  Generally weak Skin: Skin is warm and dry.  Psychiatric: no lability or agitation   Assessment/Plan: 1. Functional deficits secondary to deconditioning after multiple medical issues which require 3+ hours per day of interdisciplinary therapy in a comprehensive inpatient rehab setting.  2. DVT Prophylaxis/Anticoagulation: SCDs. Monitor for any signs of DVT.  3. Pain Management: Neurontin 300 mg each bedtime   4.  Aortic stenosis/atrial fibrillation. Continue Pradaxa as directed. Cardiac rate control. Amiodarone 200  mg daily, Toprol 12.5 mg daily and Lasix 20mg  bid  BPs normalizing  - monitor labs   -relaxed fluid restriction to 1500cc yesterday 5.  Hyponatremia. Likely related to fluid status/CHF +/- furosemide, reviewed medication list, none of these meds causes hypoNa Down to 121 will recheck after IV .9NS.  Will place on fluid restriction and recheck in am   6. Hypothyroidism. Synthroid   LOS (Days) 9 A FACE TO FACE EVALUATION WAS PERFORMED  Rogelia Boga 01/25/2014 9:54 AM   K. CMR  7 post

## 2014-01-25 NOTE — Progress Notes (Signed)
Subjective:  UOP still not recorded- urine osm 250 --- I asked "how do you feel?"  "with my hands" sodium was up yest to 124- today 121- ns started  Objective Vital signs in last 24 hours: Filed Vitals:   01/23/14 1505 01/24/14 0540 01/24/14 1500 01/25/14 0500  BP: 127/83 130/83 108/82 119/74  Pulse: 66 70 68 72  Temp: 96.7 F (35.9 C) 97.4 F (36.3 C) 97.4 F (36.3 C) 97.8 F (36.6 C)  TempSrc: Oral Oral Oral Oral  Resp: 18 18 18 18   Height:      Weight:  73.1 kg (161 lb 2.5 oz)  74.4 kg (164 lb 0.4 oz)  SpO2: 96% 99% 96% 93%   Weight change: 1.3 kg (2 lb 13.9 oz)  Intake/Output Summary (Last 24 hours) at 01/25/14 0953 Last data filed at 01/24/14 1945  Gross per 24 hour  Intake    840 ml  Output      0 ml  Net    840 ml    Assessment/Plan: 80 year old WF with hyponatremia  1. Hyponatremia- initially seemed to be due to volume overload and corrected nicely with diuresis. Now, again does show evidence again of volume overload.  Urine osm is appropriate with lasix and should allow her to excrete excess free water- she does not need IVF-Have stopped and will continue with scheduled PO lasix dosing.  Will put in place a fluid restriction as well.  If is not correcting with current measures may need more lasix as we really dont know UOP.  Will continue to follow.  2. UTI- most recent U/A looks like it is resolving- is asymptomatic. Culture with enterococcus- sens pending- no symptoms    Kelci Petrella A    Labs: Basic Metabolic Panel:  Recent Labs Lab 01/23/14 0605 01/24/14 1007 01/25/14 0455  NA 121* 124* 121*  K 3.9 4.3 4.4  CL 84* 83* 83*  CO2 26 29 28   GLUCOSE 99 171* 109*  BUN 20 20 23   CREATININE 0.83 0.91 1.06  CALCIUM 8.2* 8.7 8.4   Liver Function Tests: No results found for this basename: AST, ALT, ALKPHOS, BILITOT, PROT, ALBUMIN,  in the last 168 hours No results found for this basename: LIPASE, AMYLASE,  in the last 168 hours No results found for  this basename: AMMONIA,  in the last 168 hours CBC:  Recent Labs Lab 01/19/14 0720  WBC 9.4  NEUTROABS 6.6  HGB 11.8*  HCT 34.9*  MCV 97.8  PLT 182   Cardiac Enzymes: No results found for this basename: CKTOTAL, CKMB, CKMBINDEX, TROPONINI,  in the last 168 hours CBG: No results found for this basename: GLUCAP,  in the last 168 hours  Iron Studies: No results found for this basename: IRON, TIBC, TRANSFERRIN, FERRITIN,  in the last 72 hours Studies/Results: Dg Chest 2 View  01/23/2014   CLINICAL DATA:  Chronic congestive heart failure now with shortness of breath.  EXAM: CHEST  2 VIEW  COMPARISON:  DG CHEST 1V PORT dated 01/11/2014  FINDINGS: The lungs are mildly hypoinflated. The pulmonary interstitial markings are increased. The pulmonary vascularity is engorged and indistinct. The cardiopericardial silhouette is enlarged. The left hemidiaphragm is largely obscured. The observed portions of the bony thorax exhibit no acute abnormalities.  IMPRESSION: The findings are consistent with congestive heart failure with pulmonary interstitial edema and a moderate size left pleural effusion. There has been deterioration in the appearance of the chest since the previous study.   Electronically Signed  By: David  [place]   On: 01/23/2014 10:07   Medications: Infusions:    Scheduled Medications: . allopurinol  300 mg Oral Daily  . amiodarone  200 mg Oral Daily  . atorvastatin  10 mg Oral Daily  . dabigatran  75 mg Oral Q12H  . furosemide  20 mg Oral BID  . gabapentin  300 mg Oral QHS  . levothyroxine  88 mcg Oral QAC breakfast  . metoprolol succinate  12.5 mg Oral Daily  . oxybutynin  10 mg Oral QHS  . polycarbophil  625 mg Oral BID  . zaleplon  10 mg Oral QHS    have reviewed scheduled and prn medications.  Physical Exam: General: alert, sitting up eating breakfast, looks a little brighter today Heart: RRR Lungs: mostly clear Abdomen: benign Extremities: pitting  edema    01/25/2014,9:53 AM  LOS: 9 days

## 2014-01-25 NOTE — Progress Notes (Signed)
Occupational Therapy Weekly Progress Note  Patient Details  Name: [name] MRN: [id number] Date of Birth: 1924-04-22  Today's Date: 01/25/2014 Time: 0810-0900 Time Calculation (min): 50 min  Patient has met 1 of 4 short term goals.  Patient's progress in therapy has been limited by decreased safety awareness, decreased sustained attention, and difficulty following commands associated with hyponatremia. Patient is very fearful of falling while in sitting and standing, impacting independence with functional transfers and with ADLs.   Patient continues to demonstrate the following deficits: decreased activity tolerance, impaired postural control in standing, decreased standing balance, decreased balance reactions, decreased coordination, impaired attention, decreased safety awareness  and therefore will continue to benefit from skilled OT intervention to enhance overall performance with BADLs, activity tolerance, balance, and ability to compensate for deficits.  Patient not progressing toward long term goals.  See goal revision. Therapist downgraded LB dressing goal to mod assist and toilet transfer and task to min assist. Continue plan of care.  OT Short Term Goals Week 1:  OT Short Term Goal 1 (Week 1): Patient will complete grooming at the sink with modified independence. OT Short Term Goal 1 - Progress (Week 1): Not met OT Short Term Goal 2 (Week 1): Patient will transfers wheelchair to/from toilet via stand pivot with minimum assistance. OT Short Term Goal 2 - Progress (Week 1): Met OT Short Term Goal 3 (Week 1): Patient will complete shower transfer with minimum assistance. OT Short Term Goal 3 - Progress (Week 1): Discontinued (comment) (pt declining shower transfers) OT Short Term Goal 4 (Week 1): Patient will complete lower body dressing with minimum assistance. OT Short Term Goal 4 - Progress (Week 1): Not met OT Short Term Goal 5 (Week 1): Patient will complete upper body  dressing with setup. OT Short Term Goal 5 - Progress (Week 1): Not met Week 2:  OT Short Term Goal 1 (Week 2): Focus on LTGs  Skilled Therapeutic Interventions/Progress Updates:    Pt seen for ADL retraining with focus on safety awareness, functional transfers, LB dressing, and sit<>stand. Pt received supine in bed requesting to finish breakfast before therapy starts. Upon return pt requesting to go to bathroom and agreeable to use rollator. Pt required 5 attempts for sit>stand with max cues for placement of UEs. Pt fearful of falling with each attempt and being unsafe with rollator. Completed stand pivot transfer to w/c requiring 2 attempts for sit>stand and min assist. Pt completed stand pivot transfer w/c>toilet with mod assist and increased time and min assist toilet>w/c. Pt with incontinent episode and stated she had already gone before therapist had entered room Pt with decreased safety awareness throughout all transfers as she was attempting to sit without chair/toilet behind her. Pt completed dressing from w/c level with max assist LB and max cues. Pt very confused this AM and fearful of falling with anterior weight shift and in standing. Pt required total assist for orienting pants and donning correctly. RN notified and reports sodium level are still low therefore impacting cognition. At end of session pt left sitting in w/c with QRB donned and all needs in reach.   Therapy Documentation Precautions:  Precautions Precautions: Fall Precaution Comments: hx of falls Restrictions Weight Bearing Restrictions: No Other Position/Activity Restrictions: hx of B TKR General: General Amount of Missed OT Time (min): 10 Minutes Vital Signs:   Pain: No c/o pain during therapy session.   See FIM for current functional status  Therapy/Group: Individual Therapy  Duayne Cal 01/25/2014,  12:35 PM

## 2014-01-25 NOTE — Progress Notes (Signed)
Received critical sodium level of 121. Plotnikov MD notified with new order to start NS 0.9% @ 2375ml/hr. Willl continue to monitor.

## 2014-01-25 NOTE — Plan of Care (Signed)
Problem: RH Dressing Goal: LTG Patient will perform lower body dressing w/assist (OT) LTG: Patient will perform lower body dressing with assist, with/without cues in positioning using equipment (OT)  Downgraded to mod assist as pt declines use of AE and very fearful of falling during LB dressing  Problem: RH Toileting Goal: LTG Patient will perform toileting w/assist, cues/equip (OT) LTG: Patient will perform toiletiing (clothes management/hygiene) with assist, with/without cues using equipment (OT)  Downgraded to min assist for standing balance secondary to pt's impaired safety with fear of falling   Problem: RH Toilet Transfers Goal: LTG Patient will perform toilet transfers w/assist (OT) LTG: Patient will perform toilet transfers with assist, with/without cues using equipment (OT)  Downgraded to secondary to pt's impaired safety awareness and fear of falling.

## 2014-01-25 NOTE — Significant Event (Signed)
CRITICAL VALUE ALERT  Critical value received:  Sodium 121 Date of notification:  01/25/14  Time of notification:  0608  Critical value read back:yes  Nurse who received alert:  Verita SchneidersJamie Munoz RN  MD notified (1st page):  Plotnikov  Time of first page: [phone]  MD notified (2nd page):  Time of second page:  Responding MD:  Plotnikov  Time MD responded: [phone]

## 2014-01-26 ENCOUNTER — Inpatient Hospital Stay (HOSPITAL_COMMUNITY): Payer: Medicare Other | Admitting: Physical Therapy

## 2014-01-26 ENCOUNTER — Inpatient Hospital Stay (HOSPITAL_COMMUNITY): Payer: Medicare Other

## 2014-01-26 DIAGNOSIS — R5381 Other malaise: Secondary | ICD-10-CM

## 2014-01-26 DIAGNOSIS — I509 Heart failure, unspecified: Secondary | ICD-10-CM

## 2014-01-26 LAB — BASIC METABOLIC PANEL
BUN: 23 mg/dL (ref 6–23)
CO2: 24 mEq/L (ref 19–32)
Calcium: 8.2 mg/dL — ABNORMAL LOW (ref 8.4–10.5)
Chloride: 91 mEq/L — ABNORMAL LOW (ref 96–112)
Creatinine, Ser: 0.93 mg/dL (ref 0.50–1.10)
GFR calc Af Amer: 62 mL/min — ABNORMAL LOW (ref >90)
GFR, EST NON AFRICAN AMERICAN: 54 mL/min — AB (ref >90)
GLUCOSE: 101 mg/dL — AB (ref 70–99)
POTASSIUM: 4.3 meq/L (ref 3.7–5.3)
SODIUM: 127 meq/L — AB (ref 137–147)

## 2014-01-26 NOTE — Progress Notes (Signed)
[facility]     PROGRESS NOTE 80 y.o. right-handed female with history of hypertension, mitral regurgitation and aortic stenosis/atrial fibrillation maintained on Pradaxa. Patient lives alone with 24-hour hired care. Presented 01/11/2014 with progressive shortness of breath and lower extremity edema as well as a 10 pound weight gain. Chest x-ray with evidence of volume overload. Noted sodium level 121 on admission. Patient was placed on intravenous Lasix. Echocardiogram with ejection fraction of 65% no wall motion abnormalities there is mild stenosis with aortic valve as well as moderate mitral regurgitation. Findings of enterococcus UTI and received one dose of fosfomycin  Subjective/Complaints: No problems over night. Still with activity tolerance issues over the weekend.  A 12 point review of systems has been performed and if not noted above is otherwise negative.   Objective: Vital Signs: Blood pressure 100/58, pulse 78, temperature 98.1 F (36.7 C), temperature source Oral, resp. rate 18, height 5' 6.93" (1.7 m), weight 74.4 kg (164 lb 0.4 oz), SpO2 94.00%. No results found. No results found for this basename: WBC, HGB, HCT, PLT,  in the last 72 hours  Recent Labs  01/25/14 0455 01/26/14 0742  NA 121* 127*  K 4.4 4.3  CL 83* 91*  GLUCOSE 109* 101*  BUN 23 23  CREATININE 1.06 0.93  CALCIUM 8.4 8.2*   CBG (last 3)  No results found for this basename: GLUCAP,  in the last 72 hours  Wt Readings from Last 3 Encounters:  01/25/14 74.4 kg (164 lb 0.4 oz)  01/16/14 73.392 kg (161 lb 12.8 oz)  11/17/13 71.668 kg (158 lb)    Physical Exam:  Constitutional: She is oriented to person, place, and time. She appears well-developed. No distress  HENT: oral mucosa pink and moist, dentition fair  Head: Normocephalic.  Eyes: EOM are normal.  Neck: Normal range of motion. Neck supple. No thyromegaly present.  Cardiovascular:  Cardiac rate controlled,  irreg/irreg with systolic murmur  Ext:  Tr pedal edema Respiratory: Effort normal and breath sounds normal. No respiratory distress. No wheezes,R basilar rales.  GI: Soft. Bowel sounds are normal. She exhibits no distension. Non-tender Neurological: She is alert and oriented to person, place, and time. CN exam normal. Strength UE: 4/5 deltoid bicep, tricep, HI are 4+, LE's 4/5 HF, KE and 4/5 at ankles.. Cognitively displays fair insight and awareness. Doesn't initiate much  Skin: Skin is warm and dry.  Psychiatric: no lability or agitation. Very flat. Limited insight and awareness   Assessment/Plan: 1. Functional deficits secondary to deconditioning after multiple medical issues which require 3+ hours per day of interdisciplinary therapy in a comprehensive inpatient rehab setting. Physiatrist is providing close team supervision and 24 hour management of active medical problems listed below. Physiatrist and rehab team continue to assess barriers to discharge/monitor patient progress toward functional and medical goals. Plan D/C 3/28, will need CXR, give IV lasix this am, recheck BMET, ask cardiology to eval (Dr Mayford Knife sees her as outpt) FIM: FIM - Bathing Bathing Steps Patient Completed: Chest;Right Arm;Left Arm;Abdomen;Front perineal area;Right upper leg;Left upper leg;Buttocks Bathing: 4: Min-Patient completes 8-9 46f 10 parts or 75+ percent  FIM - Upper Body Dressing/Undressing Upper body dressing/undressing steps patient completed: Thread/unthread right sleeve of pullover shirt/dresss;Thread/unthread left sleeve of pullover shirt/dress;Put head through opening of pull over shirt/dress;Pull shirt over trunk;Thread/unthread right bra strap;Thread/unthread left bra strap Upper body dressing/undressing: 4: Min-Patient completed 75 plus % of tasks FIM - Lower Body Dressing/Undressing Lower body dressing/undressing steps patient completed:  Pull underwear up/down;Pull pants up/down Lower body  dressing/undressing: 2: Max-Patient completed 25-49% of tasks  FIM - Toileting Toileting steps completed by patient: Performs perineal hygiene;Adjust clothing after toileting Toileting Assistive Devices: Grab bar or rail for support Toileting: 3: Mod-Patient completed 2 of 3 steps  FIM - Diplomatic Services operational officerToilet Transfers Toilet Transfers Assistive Devices: Grab bars Toilet Transfers: 4-To toilet/BSC: Min A (steadying Pt. > 75%);4-From toilet/BSC: Min A (steadying Pt. > 75%)  FIM - BankerBed/Chair Transfer Bed/Chair Transfer Assistive Devices: Walker;Arm rests (rollator) Bed/Chair Transfer: 4: Bed > Chair or W/C: Min A (steadying Pt. > 75%)  FIM - Locomotion: Wheelchair Distance: 75 Locomotion: Wheelchair: 1: Total Assistance/staff pushes wheelchair (Pt<25%) FIM - Locomotion: Ambulation Locomotion: Ambulation Assistive Devices: Designer, industrial/productWalker - Rolling (rollator) Ambulation/Gait Assistance: 4: Min guard Locomotion: Ambulation: 1: Travels less than 50 ft with minimal assistance (Pt.>75%)  Comprehension Comprehension Mode: Auditory Comprehension: 4-Understands basic 75 - 89% of the time/requires cueing 10 - 24% of the time  Expression Expression Mode: Verbal Expression: 5-Expresses basic 90% of the time/requires cueing < 10% of the time.  Social Interaction Social Interaction: 2-Interacts appropriately 25 - 49% of time - Needs frequent redirection.  Problem Solving Problem Solving: 3-Solves basic 50 - 74% of the time/requires cueing 25 - 49% of the time  Memory Memory: 3-Recognizes or recalls 50 - 74% of the time/requires cueing 25 - 49% of the time Medical Problem List and Plan:  1. Deconditioning secondary to exacerbation of CHF. Monitor for any signs of fluid overload  2. DVT Prophylaxis/Anticoagulation: SCDs.    3. Pain Management: Neurontin 300 mg each bedtime  4. Neuropsych: This patient is capable of making decisions on her own behalf.  5. Aortic stenosis/atrial fibrillation. Continue Pradaxa as  directed. Cardiac rate control. Amiodarone 200 mg daily, Toprol 12.5 mg daily and Lasix 20mg  bid  BPs normalizing  -labs pending. ?Increase lasix back to 40mg  bid?  -relaxed fluid restriction to 1500cc yesterday 6. Hyponatremia. Likely related to fluid status/CHF +/- furosemide  -sodium up to 127 today  -off IVF, on FR 7. Hypothyroidism. Synthroid 8. Bladder: 100k enterococcus-sens to macrobid/amp. asypmtomatic   LOS (Days) 10 A FACE TO FACE EVALUATION WAS PERFORMED  SWARTZ,ZACHARY T 01/26/2014 9:36 AM

## 2014-01-26 NOTE — Progress Notes (Signed)
Physical Therapy Session Note  Patient Details  Name: [name] MRN: [id number] Date of Birth: [birth year]  Today's Date: 01/26/2014 Time: 0903-1000 Time Calculation (min): 57 min  Short Term Goals: Week 2:  PT Short Term Goal 1 (Week 2): STG's=LTG's secondary to ELOS  Skilled Therapeutic Interventions/Progress Updates:    Pt received seated in w/c with quick release belt on for safety. Oriented x4 with increased time. Stand pivot transfer from w/c>toilet with mod A, grab bars; stand pivot from toilet>w/c with min A, grab bars; max cueing provided for technique, sequencing, and safety. During pivot, therapist provided manual facilitation of medial/lateral weight shift. Pt partially incontinent of bladder due to urgency. RN notified. Dynamic sitting balance x3 minutes while changing brief, pants requiring min A and mod verbal cueing to maintain feet in contact with floor secondary to prevent posterior trunk lean. During hand washing (seated), pt with sustained attention x2 minutes with min cueing. Per pt request, assisted pt in donning bra and different shirt.  Sit<>stand from w/c with rollator, mod A; max cueing for safe/proper setup and technique. Gait controlled environment with rollator, min A x3' prior to posterior LOB; pt required mod A to recover. Assisted pt into seated in w/c. Pt with increased SOB, wheezing with exertion. Took seated vital signs secondary to respiratory rate of 27 breaths/min. See vital signs for detailed readings. Therapist departed pt room with pt seated in w/c with quick release belt on for safety and all needs within reach.  RN notified of session and of wheezing with minimal exertion.   Therapy Documentation Precautions:  Precautions Precautions: Fall Precaution Comments: hx of falls Restrictions Weight Bearing Restrictions: No Other Position/Activity Restrictions: hx of B TKR Vital Signs: Therapy Vitals Pulse Rate: 81 Resp: 27 (wheezing) BP: 120/72  mmHg Patient Position, if appropriate: Sitting (Immediately following gait x3') Oxygen Therapy SpO2: 97 % O2 Device: None (Room air) Pain: Pain Assessment Pain Assessment: No/denies pain Pain Score: 0-No pain Locomotion : Ambulation Ambulation/Gait Assistance: 3: Mod assist   See FIM for current functional status  Therapy/Group: Individual Therapy  Hobble, [name] 01/26/2014, 12:29 PM

## 2014-01-26 NOTE — Progress Notes (Signed)
Social Work Patient ID: [name], female   DOB: September 11, 1924, 80 y.o.   MRN: [id number] Met with son and daughter who report pt's caregiver only provide supervision level and no hands on.  Pt currently needs hands on care. Will need to continue rehab until back to the supervision level she was at before the medical issues occurred.

## 2014-01-26 NOTE — Progress Notes (Signed)
Subjective:   Went in the room and tech was filling up the water pitcher - "we have to keep her hydrated" Advised that she is on water restriction from hyponatremia/vol overload Sodium coming up with lasix Still volume overloaded with peripheral edema Pt stays "I love water" Objective Vital signs in last 24 hours: Filed Vitals:   01/26/14 0620 01/26/14 0933 01/26/14 0951 01/26/14 0956  BP: 98/55 100/58 120/72   Pulse: 82 78 81 81  Temp: 98.1 F (36.7 C)     TempSrc: Oral     Resp: 18   27  Height:      Weight:      SpO2: 94%  98% 97%   Weight change:   Intake/Output Summary (Last 24 hours) at 01/26/14 1149 Last data filed at 01/26/14 0800  Gross per 24 hour  Intake    580 ml  Output      0 ml  Net    580 ml   Physical Exam: General: alert, sitting up eating breakfast, looks a little brighter today Heart: RRR Lungs: mostly clear but few crackles at right base Abdomen: benign Extremities: pitting edema 2+ to the knees Labs: Basic Metabolic Panel:  Recent Labs Lab 01/24/14 1007 01/25/14 0455 01/26/14 0742  NA 124* 121* 127*  K 4.3 4.4 4.3  CL 83* 83* 91*  CO2 29 28 24   GLUCOSE 171* 109* 101*  BUN 20 23 23   CREATININE 0.91 1.06 0.93  CALCIUM 8.7 8.4 8.2*  Medications:  Scheduled Medications: . allopurinol  300 mg Oral Daily  . amiodarone  200 mg Oral Daily  . atorvastatin  10 mg Oral Daily  . dabigatran  75 mg Oral Q12H  . furosemide  20 mg Oral BID  . gabapentin  300 mg Oral QHS  . levothyroxine  88 mcg Oral QAC breakfast  . metoprolol succinate  12.5 mg Oral Daily  . oxybutynin  10 mg Oral QHS  . polycarbophil  625 mg Oral BID  . zaleplon  10 mg Oral QHS    have reviewed scheduled and prn medications.   Assessment/Plan: 80 year old WF with hyponatremia   1. Hyponatremia- initially seemed to be due to volume overload and corrected nicely with diuresis.  Now, again does show evidence again of volume overload. (edema/crackles)   Urine osm is  appropriate with lasix and should allow her to excrete excess free water- she does not need IVF SHE ALSO DOES NOT NEED TO BE GIVEN A FULL WATER PITCHER TO KEEP AT HER BEDSIDE!!! Have stopped and will continue with scheduled PO lasix dosing.   Will REITERATE  fluid restriction as well.  Sodium is improving with lasix 2. UTI- most recent U/A looks like it is resolving- is asymptomatic. Culture with enterococcus- sens pending- no symptoms  Myha Arizpe B  01/26/2014,11:49 AM  LOS: 10 days

## 2014-01-26 NOTE — Progress Notes (Signed)
Occupational Therapy Session Note  Patient Details  Name: [name] MRN: [id number] Date of Birth: May 24, 1924  Today's Date: 01/26/2014 Time: [id number] Time Calculation (min): 52 min  Short Term Goals: Week 2:  OT Short Term Goal 1 (Week 2): Focus on LTGs  Skilled Therapeutic Interventions/Progress Updates:    Pt seen for ADL retraining with focus on sit<>stand, functional transfers, and sitting balance. Pt received supine in bed and agreeable to therapy this AM. Completed supine>sit with mod assist. Pt demonstrating posterior lean while in sitting requiring mod assist to correct and max multimodal cues for anterior weight shift. Attempted to completed stand pivot transfer bed>w/c x6 however pt unable to complete secondary to weakness and posterior weight shift. Completed squat pivot transfer with mod assist using Bobath technique. Completed bathing from w/c level at sink with emphasis on anterior weight shift when reaching for bathing and dressing items. Pt completed sit<>stand x3 with mod assist via pulling up. Pt required total assist for LB dressing secondary to resisting anterior weight shift d/t fear of falling. Pt fatiguing quickly throughout session requiring frequent rest breaks and max multimodal cue for pursed lip breathing. At end of session therapist assisted with setting up breakfast. Pt attempting to drink from cup with lid and no fluid going into mouth stating "this is good." Pt required total assist to remove lid. RN notified of continued confusion and weakness.   Therapy Documentation Precautions:  Precautions Precautions: Fall Precaution Comments: hx of falls Restrictions Weight Bearing Restrictions: No Other Position/Activity Restrictions: hx of B TKR General: General Amount of Missed OT Time (min): 8 Minutes Vital Signs: Therapy Vitals Temp: 98.1 F (36.7 C) Temp src: Oral Pulse Rate: 82 Resp: 18 BP: 98/55 mmHg Patient Position, if appropriate:  Lying Oxygen Therapy SpO2: 94 % O2 Device: Nasal cannula O2 Flow Rate (L/min): 2 L/min Pain: No report of pain during therapy session.   See FIM for current functional status  Therapy/Group: Individual Therapy  Branden Vine N 01/26/2014, 9:30 AM

## 2014-01-26 NOTE — Progress Notes (Signed)
Social Work Patient ID: [name], female   DOB: 09-08-1924, 80 y.o.   MRN: [id number] Received call form cathy-daughter rearding update of pt';s medical condition.  Have asked MD or PA to contact her to discuss medical issues And answer her questions.  Dan-PA or Dr Riley KillSwartz will.

## 2014-01-27 ENCOUNTER — Inpatient Hospital Stay (HOSPITAL_COMMUNITY): Payer: Medicare Other

## 2014-01-27 ENCOUNTER — Inpatient Hospital Stay (HOSPITAL_COMMUNITY): Payer: Medicare Other | Admitting: Physical Therapy

## 2014-01-27 DIAGNOSIS — R5381 Other malaise: Secondary | ICD-10-CM

## 2014-01-27 DIAGNOSIS — I509 Heart failure, unspecified: Secondary | ICD-10-CM

## 2014-01-27 LAB — RENAL FUNCTION PANEL
Albumin: 2.5 g/dL — ABNORMAL LOW (ref 3.5–5.2)
BUN: 22 mg/dL (ref 6–23)
CO2: 26 mEq/L (ref 19–32)
Calcium: 8.6 mg/dL (ref 8.4–10.5)
Chloride: 92 mEq/L — ABNORMAL LOW (ref 96–112)
Creatinine, Ser: 0.96 mg/dL (ref 0.50–1.10)
GFR calc non Af Amer: 52 mL/min — ABNORMAL LOW (ref >90)
GFR, EST AFRICAN AMERICAN: 60 mL/min — AB (ref >90)
Glucose, Bld: 116 mg/dL — ABNORMAL HIGH (ref 70–99)
PHOSPHORUS: 2.8 mg/dL (ref 2.3–4.6)
Potassium: 4.2 mEq/L (ref 3.7–5.3)
SODIUM: 131 meq/L — AB (ref 137–147)

## 2014-01-27 NOTE — Progress Notes (Signed)
Nutrition Brief Note  RD consulted for diet preferences/food choices on Heart Healthy diet.   Wt Readings from Last 15 Encounters:  01/25/14 164 lb 0.4 oz (74.4 kg)  01/16/14 161 lb 12.8 oz (73.392 kg)  11/17/13 158 lb (71.668 kg)  11/06/13 163 lb (73.936 kg)  10/27/13 160 lb (72.576 kg)  05/19/13 160 lb (72.576 kg)  10/15/12 160 lb (72.576 kg)  01/27/13 161 lb 6.4 oz (73.211 kg)  06/20/12 168 lb 3.4 oz (76.3 kg)  06/06/12 190 lb (86.183 kg)    Body mass index is 25.74 kg/(m^2). Patient meets criteria for overweight based on current BMI.   Current diet order is Heart Healthy/CHO Modified Medium, patient is consuming approximately 100% of meals at this time. Labs and medications reviewed.   RD met with pt and family who state understanding and competence with Heart Healthy diet restriction.  Reviewed current fluid restriction with pt and family and they report adequate management and compliance.  They states pt does not have a dairy allergy, however it has been listed in her chart making food choices difficult.  RD will address food preferences/allergies with kitchen. No nutrition interventions warranted at this time. If nutrition issues arise, please consult RD.   Brynda Greathouse, MS RD LDN Clinical Inpatient Dietitian Pager: [phone] Weekend/After hours pager: [phone]

## 2014-01-27 NOTE — Progress Notes (Signed)
Physical Therapy Session Note  Patient Details  Name: Judith BruinsClarice V Beaupre MRN: [id number] Date of Birth: 1924/03/01  Today's Date: 01/27/2014 Time: [id number] Time Calculation (min): 42 min  Short Term Goals: Week 2:  PT Short Term Goal 1 (Week 2): STG's=LTG's secondary to ELOS  Skilled Therapeutic Interventions/Progress Updates:    Pt received seated in w/c with son present. Pt agreeable to session. Son reports that he has taken rollator to patient's home secondary to pt-demonstrated decreased safety with rollator and pt refusal to utilize alternative assistive device. Therefore, rolling walker utilized for all functional mobility during this session.  Pt performed toilet transfer with min A, grab bars. Pt continent of bladder; RN notified. Pt performed gait x31', x40' in controlled environment with rolling walker, increased time, min guard. Pt required multiple attempts, increased time, verbal cueing for setup/technique, min A to perform sit>stand from w/c with rolling walker. W/c mobility x100' total in controlled environment with bilat LE's and min A, verbal cueing for anterior weight shift in seated to increased bilat LE weightbearing, increase effectiveness of w/c propulsion.  Pt negotiated 2 standard steps with bilat rails, forward-facing, step-to pattern requiring increased time, mod encouragement, and mod A to ascend, min A to descend. Session ended in pt room, where pt was left seated in w/c with son present, and all pt needs within reach. PT explained importance of quick release belt to son. Son verbally agreed to don quick release belt if leaving pt alone.  Therapy Documentation Precautions:  Precautions Precautions: Fall Precaution Comments: hx of falls Restrictions Weight Bearing Restrictions: No Other Position/Activity Restrictions: hx of B TKR Vital Signs: Therapy Vitals Temp: 97.6 F (36.4 C) Temp src: Oral Pulse Rate: 85 Resp: 18 BP: 147/93 mmHg Patient Position, if  appropriate: Sitting Oxygen Therapy SpO2: 96 % O2 Device: None (Room air) Pain: Pain Assessment Pain Assessment: No/denies pain Pain Score: 0-No pain Locomotion : Ambulation Ambulation/Gait Assistance: 4: Min guard Wheelchair Mobility Distance: 100   See FIM for current functional status  Therapy/Group: Individual Therapy  Hobble, [name] 01/27/2014, 4:12 PM

## 2014-01-27 NOTE — Progress Notes (Signed)
Occupational Therapy Session Note  Patient Details  Name: [name] MRN: [id number] Date of Birth: [birth year]  Today's Date: 01/27/2014 Time: 0732-0830 and [id number] Time Calculation (min): 58 min and 29 min   Short Term Goals: Week 2:  OT Short Term Goal 1 (Week 2): Focus on LTGs  Skilled Therapeutic Interventions/Progress Updates:    Session 1: Pt seen for ADL retraining with focus on standing balance, LB dressing, functional transfers, and activity tolerance. Pt received supine in bed. Completed supine>sit with min assist and pt asking to go to bathroom. Agreeable to ambulate with rollator. Pt required several attempts and bed elevated to complete sit>stand at rollator with min assist. Ambulated to doorway of bathroom with increased time and min assist before pt requesting to sit d/t fear of falling and fatigue. Pt took rest break in w/c then completed stand pivot transfer w/c<>toilet with heavy reliance on grab bars. Completed bathing at sink with min assist for sit<>stand and min assist for standing balance during clothing management and hygiene. [facility] required [facility] for hand placement with each sit<>stand. Pt very fearful of falling during clothing management, especially when therapist provided min guard assist. Pt with no lob during standing. Completed dressing with focus on anterior weight shift during LB dressing to increase independence with sit>stand. Trialed a variety of alternative techniques (e.g stool, crossover) however unsuccessful. Pt required frequent rest breaks throughout therapy session. At end of session, pt left sitting in w/c with all needs in reach.   Session 2: Pt seen for 1:1 OT session with focus on LB dressing and toilet transfer. Pt received sitting in w/c with daughter present. Daughter able to convince pt to use elastic shoe strings to increase independence in LB dressing. Therapist applied shoe strings and pt initially requiring assist for donning L shoe,  however after 3 attempts pt progressed to donning both shoes at setup assist level. Ambulated into bathroom with RW and increased time. Pt with very close supervision going into bathroom and mod cues for safety with RW then required min assist for transfer toilet>RW and ambulating back to w/c. Pt very fatigued at end of session however daughter motivated by improvement today. Pt left sitting in w/c with QRB donned and all needs in reach.   Therapy Documentation Precautions:  Precautions Precautions: Fall Precaution Comments: hx of falls Restrictions Weight Bearing Restrictions: No Other Position/Activity Restrictions: hx of B TKR General:   Vital Signs: Therapy Vitals Pulse Rate: 78 BP: 110/60 mmHg Pain: No report of pain during therapy sessions.  See FIM for current functional status  Therapy/Group: Individual Therapy  Daneil Danerkinson, [name] 01/27/2014, 10:33 AM

## 2014-01-27 NOTE — Progress Notes (Signed)
Physical Therapy Session Note  Patient Details  Name: Judith BruinsClarice V Poland MRN: [id number] Date of Birth: 1924/03/04  Today's Date: 01/27/2014 Time: 0902-0959 Time Calculation (min): 57 min  Short Term Goals: Week 2:  PT Short Term Goal 1 (Week 2): STG's=LTG's secondary to ELOS  Skilled Therapeutic Interventions/Progress Updates:    Pt received seated in w/c with quick release belt on for safety. Oriented x4 with increased time. Pt agreeable to therapy. Session focused on increasing pt stability/independence with functional transfers, gait, and stair negotiation.   Gait x5' in controlled environment with rollator, min guard; gait trial ended per pt request secondary to "legs feeling shaky," per pt.  Attempted to persuade pt to utilize rolling walker for gait as opposed to rollator secondary to unsafe use and inherent instability of personal rollator; however, pt continuing to refuse to use rolling walker. Negotiated 3 (4.5") steps with bilat rails, forward-facing with step-to pattern and mod A; multimodal cueing focused on decreasing posterior trunk lean. Max encouragement provided throughout to decrease pt anxiety during stair negotiation trial.  Blocked practice of sit<>stands from mat table using stair rail (secondary to pt avoiding anterior weight shift when standing using rollaotr), initially requiring wedge for anterior pelvic tilt, mod A, max multimodal cueing for setup, technique.  Final transfer with min guard and mod cueing for setup and technique.  Gait x6' in controlled environment with rollator and mod A; gait trial ended secondary to onset of significant posterior trunk lean, at which time pt was repositioned in w/c. Transported pt in w/c to room, where pt was left seated in w/c with quick release belt on for safety and all needs within reach. No wheezing noted throughout today's session. Pt required rest breaks less frequently today, as compared with yesterday. RN and CSW, [place],  notified of session.  Therapy Documentation Precautions:  Precautions Precautions: Fall Precaution Comments: hx of falls Restrictions Weight Bearing Restrictions: No Other Position/Activity Restrictions: hx of B TKR Pain: Pain Assessment Pain Assessment: No/denies pain Pain Score: 0-No pain Locomotion : Ambulation Ambulation/Gait Assistance: 3: Mod assist Wheelchair Mobility Distance: 70   See FIM for current functional status  Therapy/Group: Individual Therapy  Hobble, [name] 01/27/2014, 12:49 PM

## 2014-01-27 NOTE — Progress Notes (Signed)
[facility]     PROGRESS NOTE 80 y.o. right-handed female with history of hypertension, mitral regurgitation and aortic stenosis/atrial fibrillation maintained on Pradaxa. Patient lives alone with 24-hour hired care. Presented 01/11/2014 with progressive shortness of breath and lower extremity edema as well as a 10 pound weight gain. Chest x-ray with evidence of volume overload. Noted sodium level 121 on admission. Patient was placed on intravenous Lasix. Echocardiogram with ejection fraction of 65% no wall motion abnormalities there is mild stenosis with aortic valve as well as moderate mitral regurgitation. Findings of enterococcus UTI and received one dose of fosfomycin  Subjective/Complaints: Resting comfortably. See in renal notes that staff giving patient extra water!! A 12 point review of systems has been performed and if not noted above is otherwise negative.   Objective: Vital Signs: Blood pressure 110/60, pulse 78, temperature 98.1 F (36.7 C), temperature source Oral, resp. rate 17, height 5' 6.93" (1.7 m), weight 74.4 kg (164 lb 0.4 oz), SpO2 92.00%. No results found. No results found for this basename: WBC, HGB, HCT, PLT,  in the last 72 hours  Recent Labs  01/25/14 0455 01/26/14 0742  NA 121* 127*  K 4.4 4.3  CL 83* 91*  GLUCOSE 109* 101*  BUN 23 23  CREATININE 1.06 0.93  CALCIUM 8.4 8.2*   CBG (last 3)  No results found for this basename: GLUCAP,  in the last 72 hours  Wt Readings from Last 3 Encounters:  01/25/14 74.4 kg (164 lb 0.4 oz)  01/16/14 73.392 kg (161 lb 12.8 oz)  11/17/13 71.668 kg (158 lb)    Physical Exam:  Constitutional: She is oriented to person, place, and time. She appears well-developed. No distress  HENT: oral mucosa pink and moist, dentition fair  Head: Normocephalic.  Eyes: EOM are normal.  Neck: Normal range of motion. Neck supple. No thyromegaly present.  Cardiovascular:  Cardiac rate controlled,  irreg/irreg with systolic murmur  Ext:  Tr pedal edema Respiratory: Effort normal and breath sounds normal. No respiratory distress. No wheezes,R basilar rales.  GI: Soft. Bowel sounds are normal. She exhibits no distension. Non-tender Neurological: She is alert and oriented to person, place, and time. CN exam normal. Strength UE: 4/5 deltoid bicep, tricep, HI are 4+, LE's 4/5 HF, KE and 4/5 at ankles.. Cognitively displays fair insight and awareness. Doesn't initiate much  Skin: Skin is warm and dry.  Psychiatric: no lability or agitation. Very flat. Limited insight and awareness   Assessment/Plan: 1. Functional deficits secondary to deconditioning after multiple medical issues which require 3+ hours per day of interdisciplinary therapy in a comprehensive inpatient rehab setting. Physiatrist is providing close team supervision and 24 hour management of active medical problems listed below. Physiatrist and rehab team continue to assess barriers to discharge/monitor patient progress toward functional and medical goals.  Working toward supervision goals  FIM: FIM - Bathing Bathing Steps Patient Completed: Chest;Right Arm;Left Arm;Abdomen;Front perineal area;Right upper leg;Left upper leg;Left lower leg (including foot);Right lower leg (including foot) Bathing: 4: Min-Patient completes 8-9 52f 10 parts or 75+ percent  FIM - Upper Body Dressing/Undressing Upper body dressing/undressing steps patient completed: Thread/unthread right sleeve of pullover shirt/dresss;Thread/unthread left sleeve of pullover shirt/dress;Put head through opening of pull over shirt/dress;Pull shirt over trunk;Thread/unthread right bra strap;Thread/unthread left bra strap Upper body dressing/undressing: 4: Min-Patient completed 75 plus % of tasks FIM - Lower Body Dressing/Undressing Lower body dressing/undressing steps patient completed: Pull underwear up/down;Pull pants up/down Lower body dressing/undressing: 1:  Total-Patient completed less than 25% of tasks  FIM - Toileting Toileting steps completed by patient: Performs perineal hygiene Toileting Assistive Devices: Grab bar or rail for support Toileting: 2: Max-Patient completed 1 of 3 steps  FIM - Diplomatic Services operational officerToilet Transfers Toilet Transfers Assistive Devices: Grab bars Toilet Transfers: 4-To toilet/BSC: Min A (steadying Pt. > 75%);4-From toilet/BSC: Min A (steadying Pt. > 75%)  FIM - BankerBed/Chair Transfer Bed/Chair Transfer Assistive Devices: Walker;Arm rests (rollator) Bed/Chair Transfer: 3: Supine > Sit: Mod A (lifting assist/Pt. 50-74%/lift 2 legs;3: Sit > Supine: Mod A (lifting assist/Pt. 50-74%/lift 2 legs);3: Bed > Chair or W/C: Mod A (lift or lower assist);3: Chair or W/C > Bed: Mod A (lift or lower assist)  FIM - Locomotion: Wheelchair Distance: 75 Locomotion: Wheelchair: 0: Activity did not occur FIM - Locomotion: Ambulation Locomotion: Ambulation Assistive Devices: Designer, industrial/productWalker - Rolling Ambulation/Gait Assistance: 3: Mod assist Locomotion: Ambulation: 1: Travels less than 50 ft with moderate assistance (Pt: 50 - 74%)  Comprehension Comprehension Mode: Auditory Comprehension: 5-Understands complex 90% of the time/Cues < 10% of the time  Expression Expression Mode: Verbal Expression: 5-Expresses complex 90% of the time/cues < 10% of the time  Social Interaction Social Interaction: 2-Interacts appropriately 25 - 49% of time - Needs frequent redirection.  Problem Solving Problem Solving: 3-Solves basic 50 - 74% of the time/requires cueing 25 - 49% of the time  Memory Memory: 3-Recognizes or recalls 50 - 74% of the time/requires cueing 25 - 49% of the time Medical Problem List and Plan:  1. Deconditioning secondary to exacerbation of CHF. Monitor for any signs of fluid overload  2. DVT Prophylaxis/Anticoagulation: SCDs.    3. Pain Management: Neurontin 300 mg each bedtime  4. Neuropsych: This patient is capable of making decisions on her own  behalf.  5. Aortic stenosis/atrial fibrillation. Continue Pradaxa as directed. Cardiac rate control. Amiodarone 200 mg daily, Toprol 12.5 mg daily and Lasix 20mg  bid  BPs normalizing  -labs pending. ?Increase lasix back to 40mg  bid?  -relaxed fluid restriction to 1500cc yesterday 6. Hyponatremia. Likely related to fluid status/CHF +/- furosemide  -sodium up to 127  Most recently  -off IVF, on FR 7. Hypothyroidism. Synthroid 8. Bladder: 100k enterococcus-sens to macrobid/amp. asypmtomatic   LOS (Days) 11 A FACE TO FACE EVALUATION WAS PERFORMED  SWARTZ,ZACHARY T 01/27/2014 9:19 AM

## 2014-01-27 NOTE — Progress Notes (Signed)
Pt states she does not feel the flow coming through her home CPAP unit. I checked for flow on and off pt and equipment is functioning properly. Pt is on home pressure settings with 2L of O2 bled in. RT will continue to monitor.

## 2014-01-27 NOTE — Progress Notes (Signed)
Subjective:   No labs yet today Volunteered that she is trying to drink less water Objective Vital signs in last 24 hours: Filed Vitals:   01/26/14 0956 01/26/14 1514 01/27/14 0543 01/27/14 0838  BP:  109/71 111/70 110/60  Pulse: 81 67 81 78  Temp:  97.4 F (36.3 C) 98.1 F (36.7 C)   TempSrc:  Oral Oral   Resp: 27 20 17    Height:      Weight:      SpO2: 97% 94% 92%    Weight change:   Intake/Output Summary (Last 24 hours) at 01/27/14 1011 Last data filed at 01/27/14 0900  Gross per 24 hour  Intake    660 ml  Output    100 ml  Net    560 ml   Physical Exam: General: alert, up in the wheelchair Heart: Normal heart sounds; regular; no S3 Lungs: Diminished at he right base Abdomen: No focal tenderness Extremities: pitting edema 2+ to the knees no sig change; wearing compression hose  Labs: none yet from today  Recent Labs Lab 01/24/14 1007 01/25/14 0455 01/26/14 0742  NA 124* 121* 127*  K 4.3 4.4 4.3  CL 83* 83* 91*  CO2 29 28 24   GLUCOSE 171* 109* 101*  BUN 20 23 23   CREATININE 0.91 1.06 0.93  CALCIUM 8.7 8.4 8.2*  Medications:  Scheduled Medications: . allopurinol  300 mg Oral Daily  . amiodarone  200 mg Oral Daily  . atorvastatin  10 mg Oral Daily  . dabigatran  75 mg Oral Q12H  . furosemide  20 mg Oral BID  . gabapentin  300 mg Oral QHS  . levothyroxine  88 mcg Oral QAC breakfast  . metoprolol succinate  12.5 mg Oral Daily  . oxybutynin  10 mg Oral QHS  . polycarbophil  625 mg Oral BID  . zaleplon  10 mg Oral QHS    have reviewed scheduled and prn medications.   Assessment/Plan: 80 year old WF with hyponatremia   Hyponatremia  Hypervolemic hyponatremia; appeared to be responding to fluid restriction (reiterated with patient and staff need for this) and lasix Still vol overload by exam No labs yet today Continue current lasix I will followup on lab results  Aowyn Rozeboom B  01/27/2014,10:11 AM  LOS: 11 days

## 2014-01-27 NOTE — Progress Notes (Signed)
Social Work Patient ID: [name], female   DOB: 04-Nov-1923, 80 y.o.   MRN: [id number] Met with pt's son who reports she is better today.  He plans to take her rollator rolling walker due to she is safer using a two wheeled walker. Will work on discharge plans and getting pt back to supervision level, so can return home soon.

## 2014-01-28 ENCOUNTER — Inpatient Hospital Stay (HOSPITAL_COMMUNITY): Payer: Medicare Other | Admitting: Physical Therapy

## 2014-01-28 ENCOUNTER — Inpatient Hospital Stay (HOSPITAL_COMMUNITY): Payer: Medicare Other

## 2014-01-28 ENCOUNTER — Encounter (HOSPITAL_COMMUNITY): Payer: Medicare Other

## 2014-01-28 DIAGNOSIS — R5381 Other malaise: Secondary | ICD-10-CM

## 2014-01-28 DIAGNOSIS — I509 Heart failure, unspecified: Secondary | ICD-10-CM

## 2014-01-28 LAB — RENAL FUNCTION PANEL
ALBUMIN: 2.3 g/dL — AB (ref 3.5–5.2)
BUN: 23 mg/dL (ref 6–23)
CALCIUM: 8.6 mg/dL (ref 8.4–10.5)
CO2: 27 mEq/L (ref 19–32)
CREATININE: 1.04 mg/dL (ref 0.50–1.10)
Chloride: 93 mEq/L — ABNORMAL LOW (ref 96–112)
GFR calc Af Amer: 54 mL/min — ABNORMAL LOW (ref >90)
GFR, EST NON AFRICAN AMERICAN: 47 mL/min — AB (ref >90)
Glucose, Bld: 117 mg/dL — ABNORMAL HIGH (ref 70–99)
Phosphorus: 2.8 mg/dL (ref 2.3–4.6)
Potassium: 3.8 mEq/L (ref 3.7–5.3)
Sodium: 133 mEq/L — ABNORMAL LOW (ref 137–147)

## 2014-01-28 NOTE — Progress Notes (Signed)
Occupational Therapy Session Note  Patient Details  Name: [name] MRN: [id number] Date of Birth: 09-01-1924  Today's Date: 01/28/2014 Time: [id number] and [id number] Time Calculation (min): 57 min and 28 min   Short Term Goals: Week 2:  OT Short Term Goal 1 (Week 2): Focus on LTGs  Skilled Therapeutic Interventions/Progress Updates:    Session 1: Pt seen for ADL retraining with focus on use of AE for LB dressing, standing balance, sit<>stand. Pt received sitting in w/c at sink with RN present. Engaged in conversation in regards to increasing confidence in self as fear of falling is impacting safety. Completed bathing at sink with steadying assist and increased time with mod cues for technique for sit<>stand. Pt with short bouts of supervision for standing balance during both bathing and dressing. Focused LB dressing on using AE as pt reporting having reacher. Pt completed LB dressing progressing to mod assist with AE and mod cues for technique. Pt with increased activity tolerance with AE and pt agreeable to this. At end of session pt left sitting in w/c with all needs in reach.   Session 2: Pt seen for 1:1 OT session with focus on toilet transfer and dynamic standing balance. Pt received sitting in w/c requesting to go to bathroom. Pt agreeable to ambulate with RW requiring 2 attempts for sit>stand and able to complete at supervision level with min cues for technique. Pt ambulated to toilet (approx 10 feet) with min guard-supervision. Pt managed clothing down at supervision level for standing balance. Pt then required min-supervision for standing balance for clothing management up. Pt required rest break before returning to room. Pt completed sit>stand from toilet at supervision level then ambulated to sink at close supervision level. Pt completed hand hygiene in standing at min guard assist then required min assist for balance and maneuvering RW to sit in w/c. At end of session pt left  sitting in w/c with all needs in reach.   Therapy Documentation Precautions:  Precautions Precautions: Fall Precaution Comments: hx of falls Restrictions Weight Bearing Restrictions: No Other Position/Activity Restrictions: hx of B TKR General:   Vital Signs:   Pain: Pt with no report of pain during therapy sessions.   See FIM for current functional status  Therapy/Group: Individual Therapy  Daneil Danerkinson, Detric Scalisi N 01/28/2014, 9:45 AM

## 2014-01-28 NOTE — Patient Care Conference (Signed)
Inpatient RehabilitationTeam Conference and Plan of Care Update Date: 01/28/2014   Time: 9;30 AM    Patient Name: [name]      Medical Record Number: [id number]  Date of Birth: October 12, 1924 Sex: Female         Room/Bed: 4W20C/4W20C-01 Payor Info: Payor: MEDICARE / Plan: MEDICARE PART A AND B / Product Type: *No Product type* /    Admitting Diagnosis: Deconditioning/CHF  Admit Date/Time:  01/16/2014  4:58 PM Admission Comments: No comment available   Primary Diagnosis:  <principal problem not specified> Principal Problem: <principal problem not specified>  Patient Active Problem List   Diagnosis Date Noted  . Acute systolic heart failure 01/14/2014  . CHF (congestive heart failure) 01/12/2014  . ASD (atrial septal defect)   . Aortic stenosis, moderate   . Moderate mitral regurgitation   . HTN (hypertension) 10/27/2013  . Aortic valve disorders 10/27/2013  . Gout 10/02/2013  . Abnormality of gait 04/17/2013  . Abnormal PFT 01/28/2013  . Diabetes with neurologic complications   . Physical deconditioning 06/06/2012  . OSA (obstructive sleep apnea) 06/04/2012  . Chronic atrial fibrillation   . Diastolic heart failure, NYHA class 1   . Hypothyroidism   . Asthma     Expected Discharge Date: Expected Discharge Date: 01/31/14  Team Members Present: Physician leading conference: Dr. Faith Rogue Social Worker Present: Dossie Der, LCSW Nurse Present: Carlean Purl, RN PT Present: Wanda Plump, PT;[name] Hobble, PT OT Present: Scherrie November, Loistine Chance, OT SLP Present: Maxcine Ham, SLP PPS Coordinator present : Tora Duck, RN, CRRN     Current Status/Progress Goal Weekly Team Focus  Medical   sodium better. volume status improved. stil anxious with activity  improve functional activity tolerance  sodium rx, volume mgt   Bowel/Bladder   continent of bladder and bowel  To remain continent bladder and bowel  Cont. to continent   Swallow/Nutrition/  Hydration             ADL's   min assist functional transfers and bathing, mod assist toilet task, max assist LB dressing, min assist UB dressing  supervision overall with min assist UB dressing  activity tolerance, standing balance, postural control, functional transfers, strengthening, safety awareness   Mobility   Mod A with transfers, gait, and stair negotiation. Limited by change in focused/sustained attention, dizziness, and decreased activity tolerance  Supervision overall, with exception of Min A with stairs  Transition to use of rolling walker with functional transfers and gait; stair negotiation, activity tolerance, and continued pt education   Communication             Safety/Cognition/ Behavioral Observations            Pain   No c/o pain   Pain <3  Assess for and treat pain q shift as needed    Skin   bottom red foam dsg in place for protection  No skin breakdown  Assess skin q shift       *See Care Plan and progress notes for long and short-term goals.  Barriers to Discharge: anxiety, safety    Possible Resolutions to Barriers:  rehab, adaptive equipment, family ed    Discharge Planning/Teaching Needs:  Need to get pt back to supervision level for caregviers to provide assist      Team Discussion:  Getting back to supervision level where she was before medical issues-sodium level back to normal-medical issues stable  Revisions to Treatment Plan:  NOne   Continued Need  for Acute Rehabilitation Level of Care: The patient requires daily medical management by a physician with specialized training in physical medicine and rehabilitation for the following conditions: Daily direction of a multidisciplinary physical rehabilitation program to ensure safe treatment while eliciting the highest outcome that is of practical value to the patient.: Yes Daily medical management of patient stability for increased activity during participation in an intensive rehabilitation  regime.: Yes Daily analysis of laboratory values and/or radiology reports with any subsequent need for medication adjustment of medical intervention for : Neurological problems;Cardiac problems;Pulmonary problems  Zacheriah Stumpe, Lemar LivingsRebecca G 01/28/2014, 2:40 PM

## 2014-01-28 NOTE — Progress Notes (Signed)
Social Work Lucy Chrisebecca G Jaisa Defino, LCSW Social Worker Signed  Patient Care Conference Service date: 01/28/2014 2:40 PM  Inpatient RehabilitationTeam Conference and Plan of Care Update Date: 01/28/2014   Time: 9;30 AM     Patient Name: Judith BruinsClarice V Sedor       Medical Record Number: [id number]   Date of Birth: May 29, 1924 Sex: Female         Room/Bed: 4W20C/4W20C-01 Payor Info: Payor: MEDICARE / Plan: MEDICARE PART A AND B / Product Type: *No Product type* /   Admitting Diagnosis: Deconditioning/CHF   Admit Date/Time:  01/16/2014  4:58 PM Admission Comments: No comment available   Primary Diagnosis:  <principal problem not specified> Principal Problem: <principal problem not specified>    Patient Active Problem List     Diagnosis  Date Noted   .  Acute systolic heart failure  01/14/2014   .  CHF (congestive heart failure)  01/12/2014   .  ASD (atrial septal defect)     .  Aortic stenosis, moderate     .  Moderate mitral regurgitation     .  HTN (hypertension)  10/27/2013   .  Aortic valve disorders  10/27/2013   .  Gout  10/02/2013   .  Abnormality of gait  04/17/2013   .  Abnormal PFT  01/28/2013   .  Diabetes with neurologic complications     .  Physical deconditioning  06/06/2012   .  OSA (obstructive sleep apnea)  06/04/2012   .  Chronic atrial fibrillation     .  Diastolic heart failure, NYHA class 1     .  Hypothyroidism     .  Asthma       Expected Discharge Date: Expected Discharge Date: 01/31/14  Team Members Present: Physician leading conference: Dr. Faith RogueZachary Swartz Social Worker Present: Dossie DerBecky Kadian Barcellos, LCSW Nurse Present: Carlean PurlMaryann Barbour, RN PT Present: Wanda Plumparoline Cook, PT;[name] Hobble, PT OT Present: Scherrie NovemberKayla Perkinson, Loistine ChanceT;Karen Pulaski, OT SLP Present: Maxcine HamLaura Paiewonsky, SLP PPS Coordinator present : Tora DuckMarie Noel, RN, CRRN        Current Status/Progress  Goal  Weekly Team Focus   Medical     sodium better. volume status improved. stil anxious with activity  improve  functional activity tolerance  sodium rx, volume mgt   Bowel/Bladder     continent of bladder and bowel  To remain continent bladder and bowel  Cont. to continent   Swallow/Nutrition/ Hydration            ADL's     min assist functional transfers and bathing, mod assist toilet task, max assist LB dressing, min assist UB dressing  supervision overall with min assist UB dressing  activity tolerance, standing balance, postural control, functional transfers, strengthening, safety awareness   Mobility     Mod A with transfers, gait, and stair negotiation. Limited by change in focused/sustained attention, dizziness, and decreased activity tolerance  Supervision overall, with exception of Min A with stairs  Transition to use of rolling walker with functional transfers and gait; stair negotiation, activity tolerance, and continued pt education   Communication            Safety/Cognition/ Behavioral Observations           Pain     No c/o pain   Pain <3  Assess for and treat pain q shift as needed    Skin     bottom red foam dsg in place for protection  No skin breakdown  Assess skin  q shift      *See Care Plan and progress notes for long and short-term goals.    Barriers to Discharge:  anxiety, safety      Possible Resolutions to Barriers:    rehab, adaptive equipment, family ed      Discharge Planning/Teaching Needs:    Need to get pt back to supervision level for caregviers to provide assist      Team Discussion:    Getting back to supervision level where she was before medical issues-sodium level back to normal-medical issues stable   Revisions to Treatment Plan:    NOne    Continued Need for Acute Rehabilitation Level of Care: The patient requires daily medical management by a physician with specialized training in physical medicine and rehabilitation for the following conditions: Daily direction of a multidisciplinary physical rehabilitation program to ensure safe treatment  while eliciting the highest outcome that is of practical value to the patient.: Yes Daily medical management of patient stability for increased activity during participation in an intensive rehabilitation regime.: Yes Daily analysis of laboratory values and/or radiology reports with any subsequent need for medication adjustment of medical intervention for : Neurological problems;Cardiac problems;Pulmonary problems  Kenijah Benningfield, Lemar Livings 01/28/2014, 2:40 PM         Lucy Chris, LCSW Social Worker Signed  Patient Care Conference Service date: 01/21/2014 4:07 PM  Inpatient RehabilitationTeam Conference and Plan of Care Update Date: 01/21/2014   Time: 10;55 am     Patient Name: [name]       Medical Record Number: [id number]   Date of Birth: [birth year] Sex: Female         Room/Bed: 4W20C/4W20C-01 Payor Info: Payor: MEDICARE / Plan: MEDICARE PART A AND B / Product Type: *No Product type* /   Admitting Diagnosis: Deconditioning/CHF   Admit Date/Time:  01/16/2014  4:58 PM Admission Comments: No comment available   Primary Diagnosis:  <principal problem not specified> Principal Problem: <principal problem not specified>    Patient Active Problem List     Diagnosis  Date Noted   .  Acute systolic heart failure  01/14/2014   .  CHF (congestive heart failure)  01/12/2014   .  ASD (atrial septal defect)     .  Aortic stenosis, moderate     .  Moderate mitral regurgitation     .  HTN (hypertension)  10/27/2013   .  Aortic valve disorders  10/27/2013   .  Gout  10/02/2013   .  Abnormality of gait  04/17/2013   .  Abnormal PFT  01/28/2013   .  Diabetes with neurologic complications     .  Physical deconditioning  06/06/2012   .  OSA (obstructive sleep apnea)  06/04/2012   .  Chronic atrial fibrillation     .  Diastolic heart failure, NYHA class 1     .  Hypothyroidism     .  Asthma       Expected Discharge Date: Expected Discharge Date: 01/24/14  Team Members  Present: Physician leading conference: Dr. Claudette Laws Social Worker Present: Dossie Der, LCSW Nurse Present: Other (comment) (Misty Cornell_RN) PT Present: Wanda Plump, PT;[name] Hobble, PT OT Present: Scherrie November, OT SLP Present: Maxcine Ham, SLP PPS Coordinator present : Tora Duck, RN, CRRN        Current Status/Progress  Goal  Weekly Team Focus   Medical     Evidence of congestive heart failure, problems with CPAP  Maintain medical  stability for discharge  Troubleshoot CPAP issues per Respt[facility]   Bowel/Bladder     STRESS INCONTINENCE-WEARS DEPENDS  CONT WITH PLAN     Swallow/Nutrition/ Hydration     WFL       ADL's     min assist functional transfers, bathing, and UB dressing, mod assist LB dressing and toilet task, decreased activity tolerance  supervision overall, min assist dressing  activity tolerance, safety awareness, functional transfers, dynamic standing balance, UE strengthening, pt education   Mobility     Min A transfers, gait, and stair negotiation. Limited by activity tolerance, fear of falling, and decreased safety using rollator.  Supervision overall  Safe use of rollator with functional transfers and gait, stair negotiation, dynamic standing balance, activity tolerance.   Communication     WFL       Safety/Cognition/ Behavioral Observations    No unsafe behaviors       Pain     NO PAIN ISSUES       Skin     NO SKIN ISSUES WILL CONTINUE TO MONITOR         *See Care Plan and progress notes for long and short-term goals.    Barriers to Discharge:  see above      Possible Resolutions to Barriers:    cont rehab      Discharge Planning/Teaching Needs:    Home with 24 hr hired caregiver at home, pt needs to be more mobile caregiver was not doing hands on care prior to admission      Team Discussion:    Making progress toward goals- PT recommends rolling walker over rollator-pt uses and likes.  Problem with CPAP machine last  night-respiratory checked on.   Revisions to Treatment Plan:    None    Continued Need for Acute Rehabilitation Level of Care: The patient requires daily medical management by a physician with specialized training in physical medicine and rehabilitation for the following conditions: Daily direction of a multidisciplinary physical rehabilitation program to ensure safe treatment while eliciting the highest outcome that is of practical value to the patient.: Yes Daily medical management of patient stability for increased activity during participation in an intensive rehabilitation regime.: Yes Daily analysis of laboratory values and/or radiology reports with any subsequent need for medication adjustment of medical intervention for : Cardiac problems;Other  Lucy Chris 01/23/2014, 2:27 PM          Patient ID: [name], female   DOB: [birth year], 80 y.o.   MRN: [id number]

## 2014-01-28 NOTE — Progress Notes (Signed)
Physical Therapy Note  Patient Details  Name: [name] MRN: [id number] Date of Birth: February 15, 1924 Today's Date: 01/28/2014  Time: 1300-1355 55 minutes  1:1 No c/o pain.  Pt c/o fatigue throughout session, rest breaks provided as appropriate.  Gait with RW with close supervision with multiple episodes of "freezing", requiring cues to continue.  Pt able to gait 50' x 4.  Stair negotiation with B handrails 3 steps x 2 with min A, good sequencing and safety noted.  Ramp/curb step training with RW.  Pt requires mod A for ascending ramp and curb due to posterior lean, cues for safety.  Standing therex with focus on hip and calf mm to improve balance.  Pt requires frequent rest breaks but is able to perform 20x heel raises and hip abd and flexion.  Nu step for LE strengthening and endurance x 7 minutes level 2.  Pt left in w/c in room with needs at hand, quick release belt in place.   Roena Sassaman 01/28/2014, 1:54 PM

## 2014-01-28 NOTE — Progress Notes (Signed)
[place] PHYSICAL MEDICINE & REHABILITATION     PROGRESS NOTE 80 y.o. right-handed female with history of hypertension, mitral regurgitation and aortic stenosis/atrial fibrillation maintained on Pradaxa. Patient lives alone with 24-hour hired care. Presented 01/11/2014 with progressive shortness of breath and lower extremity edema as well as a 10 pound weight gain. Chest x-ray with evidence of volume overload. Noted sodium level 121 on admission. Patient was placed on intravenous Lasix. Echocardiogram with ejection fraction of 65% no wall motion abnormalities there is mild stenosis with aortic valve as well as moderate mitral regurgitation. Findings of enterococcus UTI and received one dose of fosfomycin  Subjective/Complaints: Resting comfortably. No problems over night except for her CPAP function. A 12 point review of systems has been performed and if not noted above is otherwise negative.   Objective: Vital Signs: Blood pressure 141/84, pulse 78, temperature 98.2 F (36.8 C), temperature source Oral, resp. rate 18, height 5' 6.93" (1.7 m), weight 74.3 kg (163 lb 12.8 oz), SpO2 94.00%. No results found. No results found for this basename: WBC, HGB, HCT, PLT,  in the last 72 hours  Recent Labs  01/27/14 1035 01/28/14 0555  NA 131* 133*  K 4.2 3.8  CL 92* 93*  GLUCOSE 116* 117*  BUN 22 23  CREATININE 0.96 1.04  CALCIUM 8.6 8.6   CBG (last 3)  No results found for this basename: GLUCAP,  in the last 72 hours  Wt Readings from Last 3 Encounters:  01/28/14 74.3 kg (163 lb 12.8 oz)  01/16/14 73.392 kg (161 lb 12.8 oz)  11/17/13 71.668 kg (158 lb)    Physical Exam:  Constitutional: She is oriented to person, place, and time. She appears well-developed. No distress  HENT: oral mucosa pink and moist, dentition fair  Head: Normocephalic.  Eyes: EOM are normal.  Neck: Normal range of motion. Neck supple. No thyromegaly present.  Cardiovascular:  Cardiac rate controlled,  irreg/irreg with systolic murmur  Ext:  Tr pedal edema Respiratory: Effort normal and breath sounds normal. No respiratory distress. No wheezes,R basilar rales.  GI: Soft. Bowel sounds are normal. She exhibits no distension. Non-tender Neurological: She is alert and oriented to person, place, and time. CN exam normal. Strength UE: 4/5 deltoid bicep, tricep, HI are 4+, LE's 4/5 HF, KE and 4/5 at ankles.. Cognitively displays fair insight and awareness. Doesn't initiate much  Skin: Skin is warm and dry.  Psychiatric: no lability or agitation. Very flat. Limited insight and awareness   Assessment/Plan: 1. Functional deficits secondary to deconditioning after multiple medical issues which require 3+ hours per day of interdisciplinary therapy in a comprehensive inpatient rehab setting. Physiatrist is providing close team supervision and 24 hour management of active medical problems listed below. Physiatrist and rehab team continue to assess barriers to discharge/monitor patient progress toward functional and medical goals.  Working toward supervision goals  FIM: FIM - Bathing Bathing Steps Patient Completed: Chest;Right Arm;Left Arm;Abdomen;Front perineal area;Right upper leg;Left upper leg;Left lower leg (including foot);Right lower leg (including foot) Bathing: 4: Min-Patient completes 8-9 37f 10 parts or 75+ percent  FIM - Upper Body Dressing/Undressing Upper body dressing/undressing steps patient completed: Thread/unthread right sleeve of pullover shirt/dresss;Thread/unthread left sleeve of pullover shirt/dress;Put head through opening of pull over shirt/dress;Pull shirt over trunk;Thread/unthread right bra strap;Thread/unthread left bra strap;Hook/unhook bra Upper body dressing/undressing: 4: Min-Patient completed 75 plus % of tasks FIM - Lower Body Dressing/Undressing Lower body dressing/undressing steps patient completed: Pull underwear up/down;Pull pants up/down;Thread/unthread left pants  leg;Thread/unthread right  pants leg Lower body dressing/undressing: 2: Max-Patient completed 25-49% of tasks  FIM - Toileting Toileting steps completed by patient: Performs perineal hygiene (doffed clothing) Toileting Assistive Devices: Grab bar or rail for support Toileting: 3: Mod-Patient completed 2 of 3 steps  FIM - Diplomatic Services operational officerToilet Transfers Toilet Transfers Assistive Devices: Grab bars;Walker Toilet Transfers: 5-To toilet/BSC: Supervision (verbal cues/safety issues);4-From toilet/BSC: Min A (steadying Pt. > 75%)  FIM - BankerBed/Chair Transfer Bed/Chair Transfer Assistive Devices: Walker;Arm rests Bed/Chair Transfer: 4: Chair or W/C > Bed: Min A (steadying Pt. > 75%);4: Bed > Chair or W/C: Min A (steadying Pt. > 75%)  FIM - Locomotion: Wheelchair Distance: 100 Locomotion: Wheelchair: 2: Travels 50 - 149 ft with minimal assistance (Pt.>75%) FIM - Locomotion: Ambulation Locomotion: Ambulation Assistive Devices: Designer, industrial/productWalker - Rolling Ambulation/Gait Assistance: 4: Min guard Locomotion: Ambulation: 1: Travels less than 50 ft with minimal assistance (Pt.>75%)  Comprehension Comprehension Mode: Auditory Comprehension: 5-Understands complex 90% of the time/Cues < 10% of the time  Expression Expression Mode: Verbal Expression: 5-Expresses complex 90% of the time/cues < 10% of the time  Social Interaction Social Interaction: 2-Interacts appropriately 25 - 49% of time - Needs frequent redirection.  Problem Solving Problem Solving: 3-Solves basic 50 - 74% of the time/requires cueing 25 - 49% of the time  Memory Memory: 3-Recognizes or recalls 50 - 74% of the time/requires cueing 25 - 49% of the time Medical Problem List and Plan:  1. Deconditioning secondary to exacerbation of CHF. Monitor for any signs of fluid overload  2. DVT Prophylaxis/Anticoagulation: SCDs.    3. Pain Management: Neurontin 300 mg each bedtime  4. Neuropsych: This patient is capable of making decisions on her own behalf.  5.  Aortic stenosis/atrial fibrillation. Continue Pradaxa as directed. Cardiac rate control. Amiodarone 200 mg daily, Toprol 12.5 mg daily and Lasix 20mg  bid  BPs normalizing  -labs pending. ?Increase lasix back to 40mg  bid?  -relaxed fluid restriction to 1500cc yesterday 6. Hyponatremia. Likely related to fluid status/CHF +/- furosemide  -sodium responding, up to 133 today  -off IVF, on FR  -appreciate renal assist 7. Hypothyroidism. Synthroid 8. Bladder: 100k enterococcus-sens to macrobid/amp. asypmtomatic   LOS (Days) 12 A FACE TO FACE EVALUATION WAS PERFORMED  SWARTZ,ZACHARY T 01/28/2014 9:29 AM

## 2014-01-28 NOTE — Progress Notes (Signed)
Subjective:   Trying to adhere to water restriction Sodium continues to improve  Objective Vital signs in last 24 hours: Filed Vitals:   01/27/14 1434 01/27/14 1800 01/27/14 2020 01/28/14 0447  BP: 147/93 133/78  141/84  Pulse: 85 72 75 78  Temp: 97.6 F (36.4 C) 98.5 F (36.9 C)  98.2 F (36.8 C)  TempSrc: Oral Oral  Oral  Resp: 18 16 16 18   Height:      Weight:    74.3 kg (163 lb 12.8 oz)  SpO2: 96% 94% 95% 94%   Weight change:   Intake/Output Summary (Last 24 hours) at 01/28/14 1008 Last data filed at 01/28/14 0900  Gross per 24 hour  Intake    540 ml  Output      0 ml  Net    540 ml   Physical Exam: General: alert, up in the wheelchair Heart: Normal heart sounds; regular; no S3 Lungs: Diminished right base Abdomen: No focal tenderness Extremities: pitting edema 1-2+ to the knees;compression hose   Recent Labs Lab 01/26/14 0742 01/27/14 1035 01/28/14 0555  NA 127* 131* 133*  K 4.3 4.2 3.8  CL 91* 92* 93*  CO2 24 26 27   GLUCOSE 101* 116* 117*  BUN 23 22 23   CREATININE 0.93 0.96 1.04  CALCIUM 8.2* 8.6 8.6  PHOS  --  2.8 2.8  Medications:  Scheduled Medications: . allopurinol  300 mg Oral Daily  . amiodarone  200 mg Oral Daily  . atorvastatin  10 mg Oral Daily  . dabigatran  75 mg Oral Q12H  . furosemide  20 mg Oral BID  . gabapentin  300 mg Oral QHS  . levothyroxine  88 mcg Oral QAC breakfast  . metoprolol succinate  12.5 mg Oral Daily  . oxybutynin  10 mg Oral QHS  . polycarbophil  625 mg Oral BID  . zaleplon  10 mg Oral QHS   Assessment/Plan:  Hyponatremia  Hypervolemic hyponatremia Responding nicely to fluid restriction and furosemide Still has evidence volume xs by exam Should remain on current dose of furosemide and water restriction Anticipate further improvement/stabilization Nothing else to add to management at this time Will sign off - please call back if issues.  Mansel Strother B  01/28/2014,10:08 AM  LOS: 12 days

## 2014-01-28 NOTE — Progress Notes (Signed)
Social Work Patient ID: [name], female   DOB: 1924-05-27, 80 y.o.   MRN: [id number] Met with pt and spoke with daughter-cathy via telephone to inform team conference reaching supervision level goals and discharge now Sat 4/4. Pt is much safer with the two wheeled walker and daughter to see if they have one, rather than pt's four wheeled walker.  Will work toward discharge on Sat.

## 2014-01-28 NOTE — Progress Notes (Signed)
Physical Therapy Session Note  Patient Details  Name: [name] MRN: [id number] Date of Birth: [birth year]  Today's Date: 01/28/2014 Time: 1115-1200 Time Calculation (min): 45 min  Short Term Goals: Week 2:  PT Short Term Goal 1 (Week 2): STG's=LTG's secondary to ELOS  Skilled Therapeutic Interventions/Progress Updates:    Pt received seated in w/c; agreeable to session. Pt oriented x3 to self, place, and situation; mod cueing required to orient pt to date. Toilet transfer with min A using grab bars. Pt incontinent of bladder x1 episode, continent of bladder x1 episode during session; RN notified.  Performed gait x60' in controlled environment with rolling walker and close supervision, increased time; no overt LOB. Negotiate 2 standard stairs with bilat rails, forward-facing with step-to pattern and min A, increased time. Pt with decreased anxiety during stair negotiation. Multiple sit<>stand transfers from w/c with rolling walker, min guard-min A. Pt initially required multiple attempts to achieve standing; mod verbal cueing for setup and technique with ineffective within-session carryover. Retrieved w/c with standard seat height to facilitate pt independence with functional transfers. Verbal cueing providued throughout session with all mobility to remind pt to prevent quick head turns to prevent onset of dizziness. Pt with no complaints of dizziness throughout. Session ended in pt room, where pt was left seated in w/c with quick release belt on for safety and all needs within reach.  Overall, pt continuing to demonstrate progressive improvement activity tolerance, less frequent complaints of dizziness, and increased stability/independence with functional mobility.   Therapy Documentation Precautions:  Precautions Precautions: Fall Precaution Comments: hx of falls Restrictions Weight Bearing Restrictions: No Other Position/Activity Restrictions: hx of B TKR Pain: Pain  Assessment Pain Assessment: No/denies pain Pain Score: 0-No pain Locomotion : Ambulation Ambulation/Gait Assistance: 5: Supervision (close supervision)   See FIM for current functional status  Therapy/Group: Individual Therapy  [name], [name] 01/28/2014, 1:15 PM

## 2014-01-29 ENCOUNTER — Inpatient Hospital Stay (HOSPITAL_COMMUNITY): Payer: Medicare Other | Admitting: Physical Therapy

## 2014-01-29 ENCOUNTER — Inpatient Hospital Stay (HOSPITAL_COMMUNITY): Payer: Medicare Other

## 2014-01-29 ENCOUNTER — Encounter (HOSPITAL_COMMUNITY): Payer: Medicare Other

## 2014-01-29 DIAGNOSIS — I509 Heart failure, unspecified: Secondary | ICD-10-CM

## 2014-01-29 DIAGNOSIS — R5381 Other malaise: Secondary | ICD-10-CM

## 2014-01-29 LAB — RENAL FUNCTION PANEL
Albumin: 2.2 g/dL — ABNORMAL LOW (ref 3.5–5.2)
BUN: 21 mg/dL (ref 6–23)
CHLORIDE: 95 meq/L — AB (ref 96–112)
CO2: 25 mEq/L (ref 19–32)
CREATININE: 0.92 mg/dL (ref 0.50–1.10)
Calcium: 8.3 mg/dL — ABNORMAL LOW (ref 8.4–10.5)
GFR calc Af Amer: 63 mL/min — ABNORMAL LOW (ref >90)
GFR calc non Af Amer: 54 mL/min — ABNORMAL LOW (ref >90)
Glucose, Bld: 75 mg/dL (ref 70–99)
PHOSPHORUS: 3.1 mg/dL (ref 2.3–4.6)
Potassium: 3.9 mEq/L (ref 3.7–5.3)
Sodium: 135 mEq/L — ABNORMAL LOW (ref 137–147)

## 2014-01-29 NOTE — Progress Notes (Signed)
Social Work Patient ID: [name], female   DOB: 03-21-1924, 80 y.o.   MRN: [id number] Met with pt and daughter who was here to see pt in therapy.  Pt will reach supervision level by Sat and will be ready for discharge. Both pleased with how well pt is doing.  Will prepare for discharge Sat.

## 2014-01-29 NOTE — Progress Notes (Signed)
Occupational Therapy Session Note  Patient Details  Name: Judith BruinsClarice V Nath MRN: [id number] Date of Birth: Feb 18, 1924  Today's Date: 01/29/2014 Time: 0900-1003 and [id number] Time Calculation (min): 63 min and 42 min   Short Term Goals: Week 2:  OT Short Term Goal 1 (Week 2): Focus on LTGs  Skilled Therapeutic Interventions/Progress Updates:    Session 1: Pt seen for ADL retraining with focus on functional transfers, use of AE, problem solving, and activity tolerance. Pt received sitting in w/c. Completed bathing and dressing at sink with use of reacher and LH sponge with mod cues for correct use of reacher. Pt completed all sit<>stand (apprx 4) with supervision level and min cues for hand placement initially. Pt attempting to don bra upside down requiring total assist to reorient then pt donned with min assist for untwisting strap in back. Pt requesting to complete toilet task following dressing. Completed stand pivot transfer at supervision level using grab bars and clothing management at supervision level. Pt required frequent rest breaks throughout session d/t fatigue.   Session 2: Pt seen for 1:1 OT session with focus on sit<>stand to enhance independence in functional transfers and standing balance. Pt received sitting in w/c and agreeable to therapy at this time. Focused session on sit<>stand to increase independence with functional transfers. Pt initially requiring max verbal cues to use external aid of written directions and min assist. Pt then progressing to supervision for sit<>stand x8. Pt requesting to complete toilet task. Ambulated short distance (approx 4 feet) with min assist for sit>stand then supervision for ambulating to toilet, standing balance at toilet, and transfer back to w/c. Pt fatigued at end of session and left with all needs in reach.   Therapy Documentation Precautions:  Precautions Precautions: Fall Precaution Comments: hx of falls Restrictions Weight Bearing  Restrictions: No Other Position/Activity Restrictions: hx of B TKR General:   Vital Signs:   Pain: Pain Assessment Pain Assessment: No/denies pain Pain Score: 0-No pain  See FIM for current functional status  Therapy/Group: Individual Therapy  Daneil Danerkinson, Adaliz Dobis N 01/29/2014, 12:14 PM

## 2014-01-29 NOTE — Progress Notes (Signed)
[name] PHYSICAL MEDICINE & REHABILITATION     PROGRESS NOTE 80 y.o. right-handed female with history of hypertension, mitral regurgitation and aortic stenosis/atrial fibrillation maintained on Pradaxa. Patient lives alone with 24-hour hired care. Presented 01/11/2014 with progressive shortness of breath and lower extremity edema as well as a 10 pound weight gain. Chest x-ray with evidence of volume overload. Noted sodium level 121 on admission. Patient was placed on intravenous Lasix. Echocardiogram with ejection fraction of 65% no wall motion abnormalities there is mild stenosis with aortic valve as well as moderate mitral regurgitation. Findings of enterococcus UTI and received one dose of fosfomycin  Subjective/Complaints: Denies CP or SOB, appreciate nephro note  Review of Systems - Negative except fatigue   Objective: Vital Signs: Blood pressure 137/72, pulse 78, temperature 97.7 F (36.5 C), temperature source Oral, resp. rate 18, height 5' 6.93" (1.7 m), weight 74.39 kg (164 lb), SpO2 98.00%. No results found. No results found for this basename: WBC, HGB, HCT, PLT,  in the last 72 hours  Recent Labs  01/27/14 1035 01/28/14 0555  NA 131* 133*  K 4.2 3.8  CL 92* 93*  GLUCOSE 116* 117*  BUN 22 23  CREATININE 0.96 1.04  CALCIUM 8.6 8.6   CBG (last 3)  No results found for this basename: GLUCAP,  in the last 72 hours  Wt Readings from Last 3 Encounters:  01/29/14 74.39 kg (164 lb)  01/16/14 73.392 kg (161 lb 12.8 oz)  11/17/13 71.668 kg (158 lb)    Physical Exam:  Constitutional: She is oriented to person, place, and time. She appears well-developed. No distress  HENT: oral mucosa pink and moist, dentition fair  Head: Normocephalic.  Eyes: EOM are normal.  Neck: Normal range of motion. Neck supple. No thyromegaly present.  Cardiovascular:  Cardiac rate controlled, irreg/irreg with systolic murmur  Ext:  Tr pedal edema Respiratory: Effort normal and breath sounds  normal. No respiratory distress. No wheezes,R basilar rales.  GI: Soft. Bowel sounds are normal. She exhibits no distension. Non-tender Neurological: She is alert and oriented to person, place, and time. CN exam normal. Strength UE: 4/5 deltoid bicep, tricep, HI are 4+, LE's 4/5 HF, KE and 4/5 at ankles.. Cognitively displays fair insight and awareness. Doesn't initiate much  Skin: Skin is warm and dry.  Psychiatric: no lability or agitation. Very flat. Limited insight and awareness   Assessment/Plan: 1. Functional deficits secondary to deconditioning after multiple medical issues which require 3+ hours per day of interdisciplinary therapy in a comprehensive inpatient rehab setting. Physiatrist is providing close team supervision and 24 hour management of active medical problems listed below. Physiatrist and rehab team continue to assess barriers to discharge/monitor patient progress toward functional and medical goals.    FIM: FIM - Bathing Bathing Steps Patient Completed: Chest;Right Arm;Left Arm;Abdomen;Front perineal area;Right upper leg;Left upper leg;Left lower leg (including foot);Right lower leg (including foot);Buttocks Bathing: 4: Steadying assist  FIM - Upper Body Dressing/Undressing Upper body dressing/undressing steps patient completed: Thread/unthread right sleeve of pullover shirt/dresss;Thread/unthread left sleeve of pullover shirt/dress;Put head through opening of pull over shirt/dress;Pull shirt over trunk;Thread/unthread right bra strap;Thread/unthread left bra strap;Hook/unhook bra Upper body dressing/undressing: 4: Min-Patient completed 75 plus % of tasks FIM - Lower Body Dressing/Undressing Lower body dressing/undressing steps patient completed: Pull underwear up/down;Pull pants up/down;Thread/unthread left pants leg;Thread/unthread right pants leg;Fasten/unfasten right shoe;Fasten/unfasten left shoe Lower body dressing/undressing: 3: Mod-Patient completed 50-74% of  tasks  FIM - Toileting Toileting steps completed by patient: Performs perineal hygiene;Adjust  clothing prior to toileting;Adjust clothing after toileting Toileting Assistive Devices: Grab bar or rail for support Toileting: 4: Steadying assist  FIM - Diplomatic Services operational officerToilet Transfers Toilet Transfers Assistive Devices: Best boyWalker;Grab bars Toilet Transfers: 4-To toilet/BSC: Min A (steadying Pt. > 75%);5-From toilet/BSC: Supervision (verbal cues/safety issues)  FIM - BankerBed/Chair Transfer Bed/Chair Transfer Assistive Devices: Walker;Arm rests Bed/Chair Transfer: 4: Chair or W/C > Bed: Min A (steadying Pt. > 75%);4: Bed > Chair or W/C: Min A (steadying Pt. > 75%)  FIM - Locomotion: Wheelchair Distance: 100 Locomotion: Wheelchair: 1: Total Assistance/staff pushes wheelchair (Pt<25%) FIM - Locomotion: Ambulation Locomotion: Ambulation Assistive Devices: Designer, industrial/productWalker - Rolling Ambulation/Gait Assistance: 5: Supervision (close supervision) Locomotion: Ambulation: 2: Travels 50 - 149 ft with supervision/safety issues  Comprehension Comprehension Mode: Auditory Comprehension: 5-Understands complex 90% of the time/Cues < 10% of the time  Expression Expression Mode: Verbal Expression: 5-Expresses complex 90% of the time/cues < 10% of the time  Social Interaction Social Interaction: 2-Interacts appropriately 25 - 49% of time - Needs frequent redirection.  Problem Solving Problem Solving: 3-Solves basic 50 - 74% of the time/requires cueing 25 - 49% of the time  Memory Memory: 3-Recognizes or recalls 50 - 74% of the time/requires cueing 25 - 49% of the time Medical Problem List and Plan:  1. Deconditioning secondary to exacerbation of CHF. Monitor for any signs of fluid overload  2. DVT Prophylaxis/Anticoagulation: SCDs.    3. Pain Management: Neurontin 300 mg each bedtime  4. Neuropsych: This patient is capable of making decisions on her own behalf.  5. Aortic stenosis/atrial fibrillation. Continue Pradaxa as  directed. Cardiac rate control. Amiodarone 200 mg daily, Toprol 12.5 mg daily and Lasix 20mg  bid  BPs normalizing  -labs stable  -relaxed fluid restriction to 1500cc yesterday 6. Hyponatremia. Likely related to fluid status/CHF +/- furosemide  -sodium responding, up to 133 on 4/1  -off IVF, on FR  -appreciate renal assist, they have signed off 7. Hypothyroidism. Synthroid 8. Bladder: 100k enterococcus-sens to macrobid/amp. asypmtomatic   LOS (Days) 13 A FACE TO FACE EVALUATION WAS PERFORMED  [name] 01/29/2014 8:25 AM

## 2014-01-29 NOTE — Progress Notes (Signed)
Physical Therapy Session Note  Patient Details  Name: [name] MRN: [id number] Date of Birth: 26-Mar-1924  Today's Date: 01/29/2014 Time: 1105-1205 and [id number] Time Calculation (min): 60 min and 29 min  Short Term Goals: Week 2:  PT Short Term Goal 1 (Week 2): STG's=LTG's secondary to ELOS  Skilled Therapeutic Interventions/Progress Updates:    Treatment Session 1: Pt received seated in w/c; reports fatigue but agreeable to therapy. Session focused on increasing pt stabiity/independence with functional transfers, gait, and stair negotiation. Pt initially demonstrated poor initiation of sit>stand transfer, as initial 10 minutes was spent cueing pt to setup transfer. Pt required multiple attempts to achieve standing position. Performed blocked practice of sit<>stand transfers from standard chair with rolling walker, close supervision to min guard. Visual aid placed on rolling walker to remind pt of steps for setting up transfer to promote pt carryover of effective setup. Pt performed final sit>stand transfer from w/c with close supervision using visual aid without cueing.   Performed gait x55' in controlled environment with rolling walker, supervision; no episodes of dizziness and no overt LOB. Pt negotiated 2 standard steps with bilat rails, step-to pattern with min A to ascend, min guard to descend; increased time required.Therapist departed with pt seated in w/c with all needs within reach.   Treatment Session 2: Pt received seated in w/c with daughter present; pt agreeable to therapy. Session focused on assessing/addressing gait in home and community environments. Pt performed multiple sit<>stand transfers from w/c with rolling walker and close supervision. Performed gait x65' in home environment with rolling walker and supervision, x50' in community environment (outdoors, sidewalk) with rolling walker with close supervision. Pt with no reports of dizziness and no overt LOB throughout  gait trials. Session ended in pt room, where pt was left seated in w/c with daughter present and all needs within reach.   Therapy Documentation Precautions:  Precautions Precautions: Fall Precaution Comments: hx of falls Restrictions Weight Bearing Restrictions: No Other Position/Activity Restrictions: hx of B TKR Pain: Pain Assessment Pain Assessment: No/denies pain Pain Score: 0-No pain Locomotion : Ambulation Ambulation/Gait Assistance: 5: Supervision   See FIM for current functional status  Therapy/Group: Individual Therapy  Hobble, [name] 01/29/2014, 12:14 PM

## 2014-01-29 NOTE — Progress Notes (Signed)
Patient currently on home CPAP unit, FFM, home settings of 5.5 cm H20 with  2lpm O2 bleed in.  Tolerating well at this time.

## 2014-01-30 ENCOUNTER — Inpatient Hospital Stay (HOSPITAL_COMMUNITY): Payer: Medicare Other | Admitting: Physical Therapy

## 2014-01-30 ENCOUNTER — Inpatient Hospital Stay (HOSPITAL_COMMUNITY): Payer: Medicare Other | Admitting: Occupational Therapy

## 2014-01-30 ENCOUNTER — Encounter (HOSPITAL_COMMUNITY): Payer: Medicare Other | Admitting: Occupational Therapy

## 2014-01-30 LAB — RENAL FUNCTION PANEL
Albumin: 2.3 g/dL — ABNORMAL LOW (ref 3.5–5.2)
BUN: 20 mg/dL (ref 6–23)
CO2: 30 mEq/L (ref 19–32)
Calcium: 8.4 mg/dL (ref 8.4–10.5)
Chloride: 93 mEq/L — ABNORMAL LOW (ref 96–112)
Creatinine, Ser: 1.04 mg/dL (ref 0.50–1.10)
GFR calc Af Amer: 54 mL/min — ABNORMAL LOW (ref >90)
GFR calc non Af Amer: 47 mL/min — ABNORMAL LOW (ref >90)
GLUCOSE: 89 mg/dL (ref 70–99)
POTASSIUM: 3.7 meq/L (ref 3.7–5.3)
Phosphorus: 3.5 mg/dL (ref 2.3–4.6)
Sodium: 133 mEq/L — ABNORMAL LOW (ref 137–147)

## 2014-01-30 MED ORDER — POTASSIUM CHLORIDE CRYS ER 10 MEQ PO TBCR: 10.0000 meq | EXTENDED_RELEASE_TABLET | Freq: Two times a day (BID) | ORAL | Status: DC

## 2014-01-30 MED ORDER — FUROSEMIDE 20 MG PO TABS: 20.0000 mg | ORAL_TABLET | Freq: Two times a day (BID) | ORAL | Status: DC

## 2014-01-30 MED ORDER — LEVOTHYROXINE SODIUM 88 MCG PO TABS: 88.0000 ug | ORAL_TABLET | Freq: Every day | ORAL | Status: DC

## 2014-01-30 MED ORDER — OXYBUTYNIN CHLORIDE 5 MG PO TABS: 10.0000 mg | ORAL_TABLET | Freq: Every day | ORAL | Status: DC

## 2014-01-30 MED ORDER — AMIODARONE HCL 200 MG PO TABS: 200.0000 mg | ORAL_TABLET | Freq: Every day | ORAL | Status: DC

## 2014-01-30 MED ORDER — ZALEPLON 5 MG PO CAPS: 10.0000 mg | ORAL_CAPSULE | Freq: Every day | ORAL | Status: DC

## 2014-01-30 MED ORDER — ATORVASTATIN CALCIUM 10 MG PO TABS: 10.0000 mg | ORAL_TABLET | Freq: Every day | ORAL | Status: AC

## 2014-01-30 MED ORDER — METOPROLOL SUCCINATE ER 25 MG PO TB24: 12.5000 mg | ORAL_TABLET | Freq: Every day | ORAL | Status: DC

## 2014-01-30 MED ORDER — GABAPENTIN 300 MG PO CAPS: 300.0000 mg | ORAL_CAPSULE | Freq: Every day | ORAL | Status: AC

## 2014-01-30 MED ORDER — DABIGATRAN ETEXILATE MESYLATE 75 MG PO CAPS: 75.0000 mg | ORAL_CAPSULE | Freq: Two times a day (BID) | ORAL | Status: DC

## 2014-01-30 MED ORDER — ALLOPURINOL 300 MG PO TABS: 300.0000 mg | ORAL_TABLET | Freq: Every day | ORAL | Status: AC

## 2014-01-30 NOTE — Progress Notes (Signed)
Occupational Therapy Session Notes  Patient Details  Name: Moody BruinsClarice V Searight MRN: [id number] Date of Birth: 04/26/1924  Today's Date: 01/30/2014 Time: 1000-1100 and 230-300 Time Calculation (min): 60 min and 30 min  Short Term Goals: Week 2:  OT Short Term Goal 1 (Week 2): Focus on LTGs  Skilled Therapeutic Interventions/Progress Updates:  1)  Patient resting in w/c upon arrival.  Engaged in self care retraining to include bath, dress and groom.  Focused session on activity tolerance, use of AE to improve independence, sit><stands, standing tolerance and standing balance.  VCs to perform pursed lip breathing 80% of the time with patient initiating this technique 20% of the time.  Patient required mod cuing for techniques and use of AE.  Patient required min-steady assist for sit>stand with RW and with standing to pull up pants.    2)   Patient resting in recliner with daughter at her side upon arrival.  Engaged in toilet transfer and toileting with focus on sit><stands and walker safety.  Patient's daughter provided vcs as reminders for patient to achieve sit><stands without physical assistance.  Patient with increased difficulty sometimes just getting started and occasionally appears to freeze before she can get up, halfway through sit>stand once standing, when crossing a threshold and when turning.  After patient completed toileting, she required assist to manage walker at sink, had difficulty trying to decide how to make it back to the recliner, required verbal and tactile cues to turn around.  Patient became so paralyzed (fear?) she reported, "my knees are going to buckle" and required mod assist to sidestep in order to sit in wheelchair.  Attempted visual assessment and appears patient with impaired visual fields in all 4 quadrants.  Daughter reports that patient is seeing a "retinal specialist" and he recently saw her then stated that patient was doing well and he did not need to see him for a  yearly checkup.  Encouraged daughter to pursue having an eye doctor check out her peripheral visual fields as patient with difficulty following instruction during attempts to assess.  Daughter and patient overall are pleased with her progress and daughter reports that she understands patient current level of function and need for assistance.  Therapy Documentation Precautions:  Precautions Precautions: Fall Precaution Comments: Fear of falling; h/o falls Restrictions Weight Bearing Restrictions: No Other Position/Activity Restrictions: hx of B TKR Pain: Denies pain in both sessions ADL: See FIM for current functional status  Therapy/Group: Individual Therapy  Emmerson Taddei 01/30/2014, 12:32 PM

## 2014-01-30 NOTE — Progress Notes (Signed)
Patient is wearing her home CPAP unit via her full face mask. RT advised patient if she needs anything to call. RT will continue to monitor.

## 2014-01-30 NOTE — Progress Notes (Signed)
Physical Therapy Discharge Summary  Patient Details  Name: [name] MRN: [id number] Date of Birth: 07/24/1924  Today's Date: 01/30/2014 Time: 1000-1100 and 1400-1430 Time Calculation (min): 60 min and 30 min  Patient has met 6 of 8 long term goals due to improved activity tolerance, improved balance, improved postural control, increased strength, ability to compensate for deficits and improved attention.  Patient to discharge at an ambulatory level Supervision overall, with exception of stair negotiation, bed mobility, and furniture transfers.   Patient's care partner is independent to provide the necessary physical and cognitive assistance at discharge.  Reasons goals not met: Long term goals for bed mobility and furniture transfers not met, as pt required min A (as opposed to supervision, as goals stated) secondary to pt-demonstrated inconsistency performing bed mobility, car transfer with supervision. Pt's daughter notified and in full agreement with pt discharge level.  Recommendation:  Patient will benefit from ongoing skilled PT services in home health setting to continue to advance safe functional mobility, address ongoing impairments in stability/independence with functional mobility/ambulation and to minimize fall risk.  Equipment: rolling walker  Reasons for discharge: treatment goals met and discharge from hospital  Patient/family agrees with progress made and goals achieved: Yes  Skilled Therapeutic Interventions/Progress Updates: Treatment Session 1: Pt received seated in w/c having just finished OT session. Pt reports fatigue but agreeable to session. Performed sit<>stand from w/c with rolling walker and supervision, increased time. Stand pivot transfer from bed<>chair with rollator with supervision, increased time. Pt required min A with supine<>sit transfer with HOB flat, no rails.   Performed gait x96' in controlled environment with rolling walker and supervision.  Pt required min A (for manual facilitation of anterior weight shift) with sit>stand from couch with rolling walker. Negotiated 2 standard stairs with bilat rails, forward-facing with step-to pattern and min A. Pt required frequent rest breaks secondary to fatigue. Session ended in pt room, where pt was left seated in w/c with all needs within reach.   Treatment Session 2: Pt received seated in w/c with daughter, [name], present. Pt agreeable to physical therapy. Session focused on providing family education, hands-on training. Therapist explained assist currently required with all aspects of functional mobility. Daughter verbalized understanding and was in full agreement. PT explained and demonstrated appropriate cueing and safe technique for supervising with the following: sit<>stand transfers from standard chair with rolling walker, stand pivot transfer using rolling walker, and car transfer using rolling walker. Daughter gave effective return demonstration of all said aspects of functional mobility. See below for detailed findings of discharge evaluation. Session ended in pt room, where pt was left seated in bedside chair with daughter present and all needs within reach.   PT Discharge Precautions/Restrictions Precautions Precautions: Fall Precaution Comments: Fear of falling; h/o falls Restrictions Weight Bearing Restrictions: No Pain Pain Assessment Pain Assessment: No/denies pain Pain Score: 0-No pain Sensation Sensation Light Touch: Impaired by gross assessment Proprioception: Impaired by gross assessment Additional Comments: Pt reports "pins and needles" in bilat feet (stocking distribution) secondary to h/o diabetic neuropathy; proprioception inconsistent in bilat LE's Coordination Gross Motor Movements are Fluid and Coordinated: No Heel Shin Test: Difficult to formally assess secondary to limited ROM in bilat LE's (possibly secondary to h/o bilat TKA's); however, coordination with  LLE<RLE Motor  Motor Motor - Discharge Observations: Pt with shuffling gait pattern and tendency toward posterior trunk lean with standing/walking.  Mobility Bed Mobility Bed Mobility: Supine to Sit;Sit to Supine Supine to Sit: 5: Supervision;HOB flat;4: Min  assist Supine to Sit Details: Tactile cues for initiation Supine to Sit Details (indicate cue type and reason): Min A to lift trunk from R sidelying to seated Sit to Supine: 4: Min assist;HOB flat Sit to Supine - Details (indicate cue type and reason): Min A for LLE management Transfers Transfers: Yes Sit to Stand: With armrests;From chair/3-in-1 Sit to Stand Details: Verbal cues for technique;Verbal cues for safe use of DME/AE Sit to Stand Details (indicate cue type and reason): Verbal cueing for setup, safe hand placement Stand to Sit: 5: Supervision Stand Pivot Transfers: 5: Supervision Stand Pivot Transfer Details (indicate cue type and reason): Verbal cueing for setup, safe hand placement Locomotion  Ambulation Ambulation: Yes Ambulation/Gait Assistance: 5: Supervision Ambulation Distance (Feet): 96 Feet Assistive device: Rolling walker Ambulation/Gait Assistance Details: Pt performed gait x96' in controlled environment with rolling walker and supervision. Gait Gait: Yes Gait Pattern: Impaired Gait Pattern: Shuffle;Trunk flexed;Decreased dorsiflexion - right;Decreased hip/knee flexion - left;Decreased hip/knee flexion - right;Decreased dorsiflexion - left Gait velocity: .18 m/s High Level Ambulation High Level Ambulation: Side stepping Stairs / Additional Locomotion Stairs: Yes Stairs Assistance: 4: Min assist Stairs Assistance Details: Tactile cues for initiation Stair Management Technique: Two rails;Step to pattern;Forwards Number of Stairs: 2 Ramp: 5: Psychiatric nurse: Yes Wheelchair Assistance: 5: Careers information officer: Both lower extermities Wheelchair Parts  Management: Needs assistance Distance: 25  Trunk/Postural Assessment  Cervical Assessment Cervical Assessment: Within Functional Limits Thoracic Assessment Thoracic Assessment: Within Functional Limits Lumbar Assessment Lumbar Assessment: Within Functional Limits Postural Control Postural Control: Deficits on evaluation Righting Reactions: With A/P LOB, pt demonstrates effective hip strategy, delayed/ineffective ankle strategy; no stepping strategy noted.  Balance Balance Balance Assessed: Yes Static Sitting Balance Static Sitting - Balance Support: No upper extremity supported;Right upper extremity supported Static Sitting - Level of Assistance: 6: Modified independent (Device/Increase time) Static Sitting - Comment/# of Minutes: Seated EOB x4 minutes with RUE support at bed rail, mod I; no overt LOB. Dynamic Sitting Balance Dynamic Sitting - Balance Support: Left upper extremity supported;Feet supported Dynamic Sitting - Level of Assistance: 5: Stand by assistance Dynamic Sitting - Balance Activities: Reaching for objects;Lateral lean/weight shifting Sitting balance - Comments: Seated on toilet, pt requires SBA during reaching. Static Standing Balance Static Standing - Balance Support: Bilateral upper extremity supported Static Standing - Level of Assistance: 5: Stand by assistance Dynamic Standing Balance Dynamic Standing - Balance Support: During functional activity;Bilateral upper extremity supported Dynamic Standing - Level of Assistance: 5: Stand by assistance Extremity Assessment   RLE Assessment RLE Assessment: Exceptions to [facility] RLE Strength RLE Overall Strength: Deficits Right Hip Flexion: 3+/5 Right Knee Flexion: 4/5 Right Knee Extension: 4/5 Right Ankle Dorsiflexion: 3+/5 Right Ankle Plantar Flexion: 4/5 LLE Assessment LLE Assessment: Exceptions to [facility] LLE Strength LLE Overall Strength: Deficits Left Hip Flexion: 3+/5 Left Knee Flexion: 4/5 Left Knee  Extension: 4/5 Left Ankle Dorsiflexion: 4/5 Left Ankle Plantar Flexion: 4/5  See FIM for current functional status  Berenize Gatlin A 01/30/2014, 5:51 PM

## 2014-01-30 NOTE — Discharge Summary (Signed)
NAMMarvis Moeller:  [name], [name]             ACCOUNT NO.:  [id number]  MEDICAL RECORD NO.:  [id number]  LOCATION:  [id number]                        FACILITY:  [facility]  PHYSICIAN:  Erick ColaceAndrew E. Kirsteins, M.D.DATE OF BIRTH:  1923-11-02  DATE OF ADMISSION:  01/16/2014 DATE OF DISCHARGE:                              DISCHARGE SUMMARY   ADDENDUM:  The patient was scheduled for discharge home January 24, 2014.  However, due to ongoing bouts of hyponatremia, her discharge was delayed with Nephrology consult in regards to her hyponatremia.  Her Lasix was adjusted accordingly, followed closely, monitoring of any fluid overload.  Her sodiums improved nicely with latest sodium 135, improved from 121.  Renal Service had since signed off with recommendations to continue current dose of Lasix 20 mg twice daily.  No other changes were made on her medical regimen.  She continued to participate with therapies navigating to a supervision level, quite impressed with her overall progress with full family teaching completed and plan was to be discharged to home.  She would need follow up chemistries for close monitoring of her sodium levels while maintained on Lasix therapy.     [name], P.A.   ______________________________ Erick ColaceAndrew E. Kirsteins, M.D.    DA/MEDQ  D:  01/30/2014  T:  01/30/2014  Job:  [id number]

## 2014-01-30 NOTE — Progress Notes (Signed)
Patient currently on home CPAP unit via full face mask, 5.5 cm H20.

## 2014-01-30 NOTE — Discharge Summary (Signed)
  Discharge summary addendum to job [id number]

## 2014-01-30 NOTE — Progress Notes (Signed)
Social Work Discharge Note Discharge Note  The overall goal for the admission was met for:   Discharge location: Yes-HOME WITH 24 HOUR [facility]  Length of Stay: No-15 DAYS  Discharge activity level: Yes-SUPERVISION LEVEL  Home/community participation: Yes  Services provided included: MD, RD, PT, OT, RN, CM, TR, Pharmacy and [place]: Medicare and Private Insurance: [facility]  Follow-up services arranged: Home Health: [facility]-PT,RN, DME: [place] and Patient/Family request agency [facility]: [facility], [facility]: [facility]  Comments (or additional information):PT HAD MEDICAL ISSUES WHICH PROLONGED HER STAY HERE, BUT NOT HAS REACHED SUPERVISION LEVEL GOALS-READY TO [place] WITH [facility]  Patient/Family verbalized understanding of follow-up arrangements: Yes  Individual responsible for coordination of the follow-up plan: [name]-DAUGHTER  Confirmed correct DME delivered: Elease Hashimoto 01/30/2014    Elease Hashimoto

## 2014-01-30 NOTE — Plan of Care (Signed)
Problem: RH Bed Mobility Goal: LTG Patient will perform bed mobility with assist (PT) LTG: Patient will perform bed mobility with assistance, with/without cues (PT).  Outcome: Not Met (add Reason) Pt required min A for supine<>sit.  Problem: RH Furniture Transfers Goal: LTG Patient will perform furniture transfers w/assist (OT/PT LTG: Patient will perform furniture transfers with assistance (OT/PT).  Outcome: Not Met (add Reason) Pt required min A and min cueing (for setup/technique) with furniture transfer.

## 2014-01-30 NOTE — Progress Notes (Signed)
[place] [facility] & RE[facility]ILITATION     PROGRESS NOTE 80 y.o. right-handed female with history of hypertension, mitral regurgitation and aortic stenosis/atrial fibrillation Hx CHF, hyponatremia corrected with FR and diuresis  Subjective/Complaints: Denies CP or SOB, appreciate nephro note  Review of Systems - Negative except fatigue   Objective: Vital Signs: Blood pressure 130/80, pulse 78, temperature 97.6 F (36.4 C), temperature source Oral, resp. rate 18, height 5' 6.93" (1.7 m), weight 70.2 kg (154 lb 12.2 oz), SpO2 97.00%. No results found. No results found for this basename: WBC, HGB, HCT, PLT,  in the last 72 hours  Recent Labs  01/29/14 0740 01/30/14 0515  NA 135* 133*  K 3.9 3.7  CL 95* 93*  GLUCOSE 75 89  BUN 21 20  CREATININE 0.92 1.04  CALCIUM 8.3* 8.4   CBG (last 3)  No results found for this basename: GLUCAP,  in the last 72 hours  Wt Readings from Last 3 Encounters:  01/30/14 70.2 kg (154 lb 12.2 oz)  01/16/14 73.392 kg (161 lb 12.8 oz)  11/17/13 71.668 kg (158 lb)    Physical Exam:  Constitutional: She is oriented to person, place, and time. She appears well-developed. No distress  HENT: oral mucosa pink and moist, dentition fair  Head: Normocephalic.  Eyes: EOM are normal.  Neck: Normal range of motion. Neck supple. No thyromegaly present.  Cardiovascular:  Cardiac rate controlled, irreg/irreg with systolic murmur  Ext:  Tr pedal edema Respiratory: Effort normal and breath sounds normal. No respiratory distress. No wheezes,R basilar rales.  GI: Soft. Bowel sounds are normal. She exhibits no distension. Non-tender Neurological: She is alert and oriented to person, place, and time. CN exam normal. Strength UE: 4/5 deltoid bicep, tricep, HI are 4+, LE's 4/5 HF, KE and 4/5 at ankles.. Cognitively displays fair insight and awareness. Doesn't initiate much  Skin: Skin is warm and dry.  Psychiatric: no lability or  agitation.   Assessment/Plan: 1. Functional deficits secondary to deconditioning after multiple medical issues which require 3+ hours per day of interdisciplinary therapy in a comprehensive inpatient rehab setting.  Stable for D/C today F/u PCP in 1-2 weeks  See D/C summary See D/C instructions  FIM: FIM - Bathing Bathing Steps Patient Completed: Chest;Right Arm;Left Arm;Abdomen;Front perineal area;Right upper leg;Left upper leg;Left lower leg (including foot);Right lower leg (including foot);Buttocks Bathing: 5: Set-up assist to: Obtain items  FIM - Upper Body Dressing/Undressing Upper body dressing/undressing steps patient completed: Thread/unthread right sleeve of pullover shirt/dresss;Thread/unthread left sleeve of pullover shirt/dress;Put head through opening of pull over shirt/dress;Pull shirt over trunk;Thread/unthread right bra strap;Thread/unthread left bra strap;Hook/unhook bra Upper body dressing/undressing: 4: Min-Patient completed 75 plus % of tasks FIM - Lower Body Dressing/Undressing Lower body dressing/undressing steps patient completed: Pull underwear up/down;Pull pants up/down;Thread/unthread left pants leg;Thread/unthread right pants leg;Thread/unthread right underwear leg;Thread/unthread left underwear leg;Don/Doff left shoe Lower body dressing/undressing: 4: Min-Patient completed 75 plus % of tasks  FIM - Toileting Toileting steps completed by patient: Performs perineal hygiene;Adjust clothing prior to toileting;Adjust clothing after toileting Toileting Assistive Devices: Grab bar or rail for support Toileting: 5: Supervision: Safety issues/verbal cues  FIM - Diplomatic Services operational officerToilet Transfers Toilet Transfers Assistive Devices: Grab bars Toilet Transfers: 5-From toilet/BSC: Supervision (verbal cues/safety issues);5-To toilet/BSC: Supervision (verbal cues/safety issues)  FIM - BankerBed/Chair Transfer Bed/Chair Transfer Assistive Devices: Walker;Arm rests Bed/Chair Transfer: 5: Chair  or W/C > Bed: Supervision (verbal cues/safety issues);5: Bed > Chair or W/C: Supervision (verbal cues/safety issues) (close supervision)  FIM - Locomotion:  Wheelchair Distance: 100 Locomotion: Wheelchair: 1: Total Assistance/staff pushes wheelchair (Pt<25%) FIM - Locomotion: Ambulation Locomotion: Ambulation Assistive Devices: Designer, industrial/product Ambulation/Gait Assistance: 5: Supervision Locomotion: Ambulation: 2: Travels 50 - 149 ft with supervision/safety issues  Comprehension Comprehension Mode: Auditory Comprehension: 5-Understands complex 90% of the time/Cues < 10% of the time  Expression Expression Mode: Verbal Expression: 5-Expresses complex 90% of the time/cues < 10% of the time  Social Interaction Social Interaction: 2-Interacts appropriately 25 - 49% of time - Needs frequent redirection.  Problem Solving Problem Solving: 3-Solves basic 50 - 74% of the time/requires cueing 25 - 49% of the time  Memory Memory: 3-Recognizes or recalls 50 - 74% of the time/requires cueing 25 - 49% of the time Medical Problem List and Plan:  1. Deconditioning secondary to exacerbation of CHF. Monitor for any signs of fluid overload  2. DVT Prophylaxis/Anticoagulation: SCDs.    3. Pain Management: Neurontin 300 mg each bedtime  4. Neuropsych: This patient is capable of making decisions on her own behalf.  5. Aortic stenosis/atrial fibrillation. Continue Pradaxa as directed. Cardiac rate control. Amiodarone 200 mg daily, Toprol 12.5 mg daily and Lasix 20mg  bid  BPs normalizing  -labs stable  -relaxed fluid restriction to 1500cc yesterday 6. Hyponatremia. Likely related to fluid status/CHF +/- furosemide  -sodium responding, up to 133 on 4/1  -off IVF, on FR  -appreciate renal assist, they have signed off 7. Hypothyroidism. Synthroid     LOS (Days) 14 A FACE TO FACE EVALUATION WAS PERFORMED  Erick Colace 01/30/2014 9:37 AM

## 2014-01-31 LAB — RENAL FUNCTION PANEL
Albumin: 2.3 g/dL — ABNORMAL LOW (ref 3.5–5.2)
BUN: 21 mg/dL (ref 6–23)
CALCIUM: 8.4 mg/dL (ref 8.4–10.5)
CHLORIDE: 94 meq/L — AB (ref 96–112)
CO2: 31 meq/L (ref 19–32)
Creatinine, Ser: 1.11 mg/dL — ABNORMAL HIGH (ref 0.50–1.10)
GFR, EST AFRICAN AMERICAN: 50 mL/min — AB (ref >90)
GFR, EST NON AFRICAN AMERICAN: 43 mL/min — AB (ref >90)
GLUCOSE: 85 mg/dL (ref 70–99)
Phosphorus: 3.5 mg/dL (ref 2.3–4.6)
Potassium: 3.8 mEq/L (ref 3.7–5.3)
SODIUM: 137 meq/L (ref 137–147)

## 2014-01-31 NOTE — Progress Notes (Signed)
[place] [facility] MEDICINE & REHABILITATION     PROGRESS NOTE 80 y.o. right-handed female with history of hypertension, mitral regurgitation and aortic stenosis/atrial fibrillation Hx CHF, hyponatremia corrected with FR and diuresis  Subjective/Complaints: Denies CP or SOB, aware of D/C today  Review of Systems - Negative except fatigue   Objective: Vital Signs: Blood pressure 122/74, pulse 65, temperature 97.4 F (36.3 C), temperature source Oral, resp. rate 17, height 5' 6.93" (1.7 m), weight 71.3 kg (157 lb 3 oz), SpO2 95.00%. No results found. No results found for this basename: WBC, HGB, HCT, PLT,  in the last 72 hours  Recent Labs  01/30/14 0515 01/31/14 0535  NA 133* 137  K 3.7 3.8  CL 93* 94*  GLUCOSE 89 85  BUN 20 21  CREATININE 1.04 1.11*  CALCIUM 8.4 8.4   CBG (last 3)  No results found for this basename: GLUCAP,  in the last 72 hours  Wt Readings from Last 3 Encounters:  01/31/14 71.3 kg (157 lb 3 oz)  01/16/14 73.392 kg (161 lb 12.8 oz)  11/17/13 71.668 kg (158 lb)    Physical Exam:  Constitutional: She is oriented to person, place, and time. She appears well-developed. No distress  HENT: oral mucosa pink and moist, dentition fair  Head: Normocephalic.  Eyes: EOM are normal.  Neck: Normal range of motion. Neck supple. No thyromegaly present.  Cardiovascular:  Cardiac rate controlled, irreg/irreg with systolic murmur  Ext:  Tr pedal and ankle edema Respiratory: Effort normal and breath sounds normal. No respiratory distress. No wheezes,R basilar rales.  GI: Soft. Bowel sounds are normal. She exhibits no distension. Non-tender Neurological: She is alert and oriented to person, place, and time. CN exam normal. Strength UE: 4/5 deltoid bicep, tricep, HI are 4+, LE's 4/5 HF, KE and 4/5 at ankles.. Cognitively displays fair insight and awareness. Doesn't initiate much  Skin: Skin is warm and dry.  Psychiatric: no lability or  agitation.   Assessment/Plan: 1. Functional deficits secondary to deconditioning after multiple medical issues which require 3+ hours per day of interdisciplinary therapy in a comprehensive inpatient rehab setting.  Stable for D/C today F/u PCP in 1-2 weeks  See D/C summary See D/C instructions  FIM: FIM - Bathing Bathing Steps Patient Completed: Chest;Right Arm;Left Arm;Abdomen;Front perineal area;Buttocks;Right upper leg;Left upper leg;Right lower leg (including foot);Left lower leg (including foot) Bathing: 5: Set-up assist to: Obtain items (lh sponge)  FIM - Upper Body Dressing/Undressing Upper body dressing/undressing steps patient completed: Thread/unthread right bra strap;Thread/unthread left bra strap;Hook/unhook bra;Thread/unthread right sleeve of pullover shirt/dresss;Thread/unthread left sleeve of pullover shirt/dress;Put head through opening of pull over shirt/dress Upper body dressing/undressing: 4: Min-Patient completed 75 plus % of tasks (Assist needed to pull down second shirt ) FIM - Lower Body Dressing/Undressing Lower body dressing/undressing steps patient completed: Thread/unthread right underwear leg;Thread/unthread left underwear leg;Pull underwear up/down;Thread/unthread right pants leg;Thread/unthread left pants leg;Pull pants up/down;Don/Doff right shoe;Don/Doff left shoe Lower body dressing/undressing: 4: Steadying Assist  FIM - Toileting Toileting steps completed by patient: Adjust clothing prior to toileting;Performs perineal hygiene;Adjust clothing after toileting Toileting Assistive Devices: Grab bar or rail for support Toileting: 5: Supervision: Safety issues/verbal cues  FIM - Diplomatic Services operational officer Devices: Grab bars;Walker;Elevated toilet seat Toilet Transfers: 5-From toilet/BSC: Supervision (verbal cues/safety issues);5-To toilet/BSC: Supervision (verbal cues/safety issues)  FIM - Banker  Devices: Walker;Arm rests Bed/Chair Transfer: 5: Supine > Sit: Supervision (verbal cues/safety issues);5: Sit > Supine: Supervision (verbal cues/safety issues);4: Chair  or W/C > Bed: Min A (steadying Pt. > 75%);4: Bed > Chair or W/C: Min A (steadying Pt. > 75%)  FIM - Locomotion: Wheelchair Distance: 25 Locomotion: Wheelchair: 1: Travels less than 50 ft with supervision, cueing or coaxing FIM - Locomotion: Ambulation Locomotion: Ambulation Assistive Devices: Designer, industrial/productWalker - Rolling Ambulation/Gait Assistance: 5: Supervision Locomotion: Ambulation: 2: Travels 50 - 149 ft with supervision/safety issues  Comprehension Comprehension Mode: Auditory Comprehension: 5-Understands basic 90% of the time/requires cueing < 10% of the time  Expression Expression Mode: Verbal Expression: 5-Expresses basic needs/ideas: With no assist  Social Interaction Social Interaction: 3-Interacts appropriately 50 - 74% of the time - May be physically or verbally inappropriate.  Problem Solving Problem Solving: 3-Solves basic 50 - 74% of the time/requires cueing 25 - 49% of the time  Memory Memory: 3-Recognizes or recalls 50 - 74% of the time/requires cueing 25 - 49% of the time Medical Problem List and Plan:  1. Deconditioning secondary to exacerbation of CHF. Monitor for any signs of fluid overload  2. DVT Prophylaxis/Anticoagulation: SCDs.    3. Pain Management: Neurontin 300 mg each bedtime  4. Neuropsych: This patient is capable of making decisions on her own behalf.  5. Aortic stenosis/atrial fibrillation. Continue Pradaxa as directed. Cardiac rate control. Amiodarone 200 mg daily, Toprol 12.5 mg daily and Lasix 20mg  bid  BPs normalizing  -labs stable  6. Hyponatremia. Likely related to fluid status/CHF +/- furosemide  -sodium responding, up to 133 on 4/1  -off IVF, on FR  -appreciate renal assist, they have signed off 7. Hypothyroidism. Synthroid     LOS (Days) 15 A FACE TO FACE EVALUATION WAS  PERFORMED  Claudette LawsKIRSTEINS,Shawntavia Saunders E 01/31/2014 8:50 AM

## 2014-02-01 NOTE — Plan of Care (Signed)
Problem: RH Dressing Goal: LTG Patient will perform lower body dressing w/assist (OT) LTG: Patient will perform lower body dressing with assist, with/without cues in positioning using equipment (OT)  Outcome: Not Met (add Reason) Patient inconsistently completes at supervision level. Requires min assist at times for sit>stand secondary to fear of falling

## 2014-02-01 NOTE — Progress Notes (Signed)
Occupational Therapy Discharge Summary  Patient Details  Name: [name] MRN: [id number] Date of Birth: 1924-09-15  Today's Date: 02/01/2014  Patient has met 5 of 6 long term goals due to improved activity tolerance, improved balance, postural control, ability to compensate for deficits and improved attention.  Patient to discharge at overall supervision-occasional min assist level.  Patient's care partner is independent to provide the necessary physical and cognitive assistance at discharge. Patient had hired assistance 24/7 at home PTA.  Reasons goals not met: Long term goal for LB dressing not met secondary to patient inconsistently completing at supervision level. Patient requires min assist for sit>stand at times secondary to fear of falling and fatigue. Patient's daughter aware of discharge level for LB dressing and in full agreement.   Recommendation:  Patient will benefit from ongoing skilled OT services in home health setting to continue to advance functional skills in the area of BADL and functional transfers, increased safety.  Equipment: No equipment provided  Reasons for discharge: treatment goals met and discharge from hospital  Patient/family agrees with progress made and goals achieved: Yes  OT Discharge Precautions/Restrictions  Precautions Precautions: Fall Precaution Comments: Fear of falling; h/o falls Restrictions Weight Bearing Restrictions: No General   Vital Signs   Pain   ADL   Vision/Perception  Vision- History Baseline Vision/History: Wears glasses Wears Glasses: At all times Patient Visual Report: No change from baseline Vision- Assessment Eye Alignment: Within Functional Limits  Cognition Overall Cognitive Status: History of cognitive impairments - at baseline Arousal/Alertness: Awake/alert Orientation Level: Oriented X4 Attention: Sustained Sustained Attention: Appears intact Memory: Impaired Memory Impairment: Decreased recall of  new information;Decreased short term memory Sensation Sensation Light Touch: Appears Intact Coordination Gross Motor Movements are Fluid and Coordinated: No Fine Motor Movements are Fluid and Coordinated: Yes Motor    Mobility     Trunk/Postural Assessment     Balance   Extremity/Trunk Assessment RUE Assessment RUE Assessment: Within Functional Limits LUE Assessment LUE Assessment: Within Functional Limits  See FIM for current functional status  Fadil Macmaster N 02/01/2014, 10:30 AM

## 2014-02-03 ENCOUNTER — Emergency Department (HOSPITAL_COMMUNITY): Payer: Medicare Other

## 2014-02-03 ENCOUNTER — Encounter (HOSPITAL_COMMUNITY): Payer: Self-pay | Admitting: Emergency Medicine

## 2014-02-03 ENCOUNTER — Inpatient Hospital Stay (HOSPITAL_COMMUNITY)
Admission: EM | Admit: 2014-02-03 | Discharge: 2014-02-08 | DRG: 291 | Disposition: A | Discharge status: Home / Self Care | Payer: Medicare Other | Attending: Internal Medicine | Admitting: Internal Medicine

## 2014-02-03 DIAGNOSIS — R5381 Other malaise: Secondary | ICD-10-CM

## 2014-02-03 DIAGNOSIS — J9600 Acute respiratory failure, unspecified whether with hypoxia or hypercapnia: Secondary | ICD-10-CM | POA: Diagnosis present

## 2014-02-03 DIAGNOSIS — I359 Nonrheumatic aortic valve disorder, unspecified: Secondary | ICD-10-CM

## 2014-02-03 DIAGNOSIS — N183 Chronic kidney disease, stage 3 unspecified: Secondary | ICD-10-CM | POA: Diagnosis present

## 2014-02-03 DIAGNOSIS — E785 Hyperlipidemia, unspecified: Secondary | ICD-10-CM | POA: Diagnosis present

## 2014-02-03 DIAGNOSIS — R269 Unspecified abnormalities of gait and mobility: Secondary | ICD-10-CM

## 2014-02-03 DIAGNOSIS — I4891 Unspecified atrial fibrillation: Secondary | ICD-10-CM | POA: Diagnosis present

## 2014-02-03 DIAGNOSIS — I5033 Acute on chronic diastolic (congestive) heart failure: Principal | ICD-10-CM

## 2014-02-03 DIAGNOSIS — E871 Hypo-osmolality and hyponatremia: Secondary | ICD-10-CM

## 2014-02-03 DIAGNOSIS — Z96659 Presence of unspecified artificial knee joint: Secondary | ICD-10-CM

## 2014-02-03 DIAGNOSIS — E1142 Type 2 diabetes mellitus with diabetic polyneuropathy: Secondary | ICD-10-CM | POA: Diagnosis present

## 2014-02-03 DIAGNOSIS — I482 Chronic atrial fibrillation, unspecified: Secondary | ICD-10-CM

## 2014-02-03 DIAGNOSIS — I340 Nonrheumatic mitral (valve) insufficiency: Secondary | ICD-10-CM

## 2014-02-03 DIAGNOSIS — R942 Abnormal results of pulmonary function studies: Secondary | ICD-10-CM

## 2014-02-03 DIAGNOSIS — Q211 Atrial septal defect, unspecified: Secondary | ICD-10-CM

## 2014-02-03 DIAGNOSIS — Z9849 Cataract extraction status, unspecified eye: Secondary | ICD-10-CM

## 2014-02-03 DIAGNOSIS — J45909 Unspecified asthma, uncomplicated: Secondary | ICD-10-CM

## 2014-02-03 DIAGNOSIS — I509 Heart failure, unspecified: Secondary | ICD-10-CM

## 2014-02-03 DIAGNOSIS — I350 Nonrheumatic aortic (valve) stenosis: Secondary | ICD-10-CM

## 2014-02-03 DIAGNOSIS — Z79899 Other long term (current) drug therapy: Secondary | ICD-10-CM

## 2014-02-03 DIAGNOSIS — E1149 Type 2 diabetes mellitus with other diabetic neurological complication: Secondary | ICD-10-CM

## 2014-02-03 DIAGNOSIS — Q2111 Secundum atrial septal defect: Secondary | ICD-10-CM

## 2014-02-03 DIAGNOSIS — E039 Hypothyroidism, unspecified: Secondary | ICD-10-CM

## 2014-02-03 DIAGNOSIS — N179 Acute kidney failure, unspecified: Secondary | ICD-10-CM

## 2014-02-03 DIAGNOSIS — R531 Weakness: Secondary | ICD-10-CM

## 2014-02-03 DIAGNOSIS — J189 Pneumonia, unspecified organism: Secondary | ICD-10-CM

## 2014-02-03 DIAGNOSIS — I080 Rheumatic disorders of both mitral and aortic valves: Secondary | ICD-10-CM | POA: Diagnosis present

## 2014-02-03 DIAGNOSIS — N390 Urinary tract infection, site not specified: Secondary | ICD-10-CM

## 2014-02-03 DIAGNOSIS — I10 Essential (primary) hypertension: Secondary | ICD-10-CM

## 2014-02-03 DIAGNOSIS — I5021 Acute systolic (congestive) heart failure: Secondary | ICD-10-CM

## 2014-02-03 DIAGNOSIS — G4733 Obstructive sleep apnea (adult) (pediatric): Secondary | ICD-10-CM

## 2014-02-03 DIAGNOSIS — Z8673 Personal history of transient ischemic attack (TIA), and cerebral infarction without residual deficits: Secondary | ICD-10-CM

## 2014-02-03 DIAGNOSIS — I129 Hypertensive chronic kidney disease with stage 1 through stage 4 chronic kidney disease, or unspecified chronic kidney disease: Secondary | ICD-10-CM | POA: Diagnosis present

## 2014-02-03 DIAGNOSIS — M109 Gout, unspecified: Secondary | ICD-10-CM

## 2014-02-03 DIAGNOSIS — I079 Rheumatic tricuspid valve disease, unspecified: Secondary | ICD-10-CM | POA: Diagnosis present

## 2014-02-03 DIAGNOSIS — I5030 Unspecified diastolic (congestive) heart failure: Secondary | ICD-10-CM

## 2014-02-03 DIAGNOSIS — K219 Gastro-esophageal reflux disease without esophagitis: Secondary | ICD-10-CM | POA: Diagnosis present

## 2014-02-03 LAB — PRO B NATRIURETIC PEPTIDE: PRO B NATRI PEPTIDE: 2950 pg/mL — AB (ref 0–450)

## 2014-02-03 LAB — URINALYSIS, ROUTINE W REFLEX MICROSCOPIC
Bilirubin Urine: NEGATIVE
GLUCOSE, UA: NEGATIVE mg/dL
Hgb urine dipstick: NEGATIVE
Ketones, ur: NEGATIVE mg/dL
Nitrite: POSITIVE — AB
PH: 6.5 (ref 5.0–8.0)
Protein, ur: NEGATIVE mg/dL
Specific Gravity, Urine: 1.013 (ref 1.005–1.030)
Urobilinogen, UA: 0.2 mg/dL (ref 0.0–1.0)

## 2014-02-03 LAB — CBC
HEMATOCRIT: 35.7 % — AB (ref 36.0–46.0)
HEMOGLOBIN: 11.9 g/dL — AB (ref 12.0–15.0)
MCH: 32.4 pg (ref 26.0–34.0)
MCHC: 33.3 g/dL (ref 30.0–36.0)
MCV: 97.3 fL (ref 78.0–100.0)
Platelets: 204 10*3/uL (ref 150–400)
RBC: 3.67 MIL/uL — AB (ref 3.87–5.11)
RDW: 16.3 % — ABNORMAL HIGH (ref 11.5–15.5)
WBC: 11 10*3/uL — AB (ref 4.0–10.5)

## 2014-02-03 LAB — BASIC METABOLIC PANEL
BUN: 31 mg/dL — AB (ref 6–23)
CHLORIDE: 91 meq/L — AB (ref 96–112)
CO2: 26 meq/L (ref 19–32)
Calcium: 9.2 mg/dL (ref 8.4–10.5)
Creatinine, Ser: 1.18 mg/dL — ABNORMAL HIGH (ref 0.50–1.10)
GFR calc Af Amer: 47 mL/min — ABNORMAL LOW (ref >90)
GFR calc non Af Amer: 40 mL/min — ABNORMAL LOW (ref >90)
GLUCOSE: 161 mg/dL — AB (ref 70–99)
Potassium: 5 mEq/L (ref 3.7–5.3)
SODIUM: 133 meq/L — AB (ref 137–147)

## 2014-02-03 LAB — URINE MICROSCOPIC-ADD ON

## 2014-02-03 LAB — I-STAT TROPONIN, ED: Troponin i, poc: 0.03 ng/mL (ref 0.00–0.08)

## 2014-02-03 LAB — I-STAT CG4 LACTIC ACID, ED: Lactic Acid, Venous: 2.21 mmol/L — ABNORMAL HIGH (ref 0.5–2.2)

## 2014-02-03 MED ORDER — ACETAMINOPHEN 500 MG PO TABS: 500.0000 mg | ORAL_TABLET | Freq: Four times a day (QID) | ORAL | Status: DC | PRN

## 2014-02-03 MED ORDER — ALLOPURINOL 300 MG PO TABS
300.0000 mg | ORAL_TABLET | Freq: Every day | ORAL | Status: DC
  Administered 2014-02-04 – 2014-02-08 (×5): 300 mg via ORAL
  Filled 2014-02-03 (×5): qty 1

## 2014-02-03 MED ORDER — FUROSEMIDE 10 MG/ML IJ SOLN
20.0000 mg | Freq: Once | INTRAMUSCULAR | Status: AC
  Administered 2014-02-04: 20 mg via INTRAVENOUS
  Filled 2014-02-03: qty 2

## 2014-02-03 MED ORDER — POTASSIUM CHLORIDE CRYS ER 10 MEQ PO TBCR
10.0000 meq | EXTENDED_RELEASE_TABLET | Freq: Two times a day (BID) | ORAL | Status: DC
  Administered 2014-02-04 – 2014-02-05 (×4): 10 meq via ORAL
  Filled 2014-02-03 (×6): qty 1

## 2014-02-03 MED ORDER — OMEGA-3-ACID ETHYL ESTERS 1 G PO CAPS
1.0000 g | ORAL_CAPSULE | Freq: Two times a day (BID) | ORAL | Status: DC
  Administered 2014-02-04 – 2014-02-08 (×9): 1 g via ORAL
  Filled 2014-02-03 (×10): qty 1

## 2014-02-03 MED ORDER — DEXTROSE 5 % IV SOLN: 1.0000 g | INTRAVENOUS | Status: DC

## 2014-02-03 MED ORDER — OXYBUTYNIN CHLORIDE 5 MG PO TABS
10.0000 mg | ORAL_TABLET | Freq: Every day | ORAL | Status: DC
  Administered 2014-02-04 – 2014-02-07 (×5): 10 mg via ORAL
  Filled 2014-02-03 (×6): qty 2

## 2014-02-03 MED ORDER — VANCOMYCIN HCL IN DEXTROSE 1-5 GM/200ML-% IV SOLN
1000.0000 mg | Freq: Once | INTRAVENOUS | Status: AC
  Administered 2014-02-04: 1000 mg via INTRAVENOUS
  Filled 2014-02-03: qty 200

## 2014-02-03 MED ORDER — SODIUM CHLORIDE 0.9 % IV BOLUS (SEPSIS)
500.0000 mL | Freq: Once | INTRAVENOUS | Status: AC
  Administered 2014-02-03: 500 mL via INTRAVENOUS

## 2014-02-03 MED ORDER — AMIODARONE HCL 200 MG PO TABS
200.0000 mg | ORAL_TABLET | Freq: Every day | ORAL | Status: DC
  Administered 2014-02-04 – 2014-02-05 (×2): 200 mg via ORAL
  Filled 2014-02-03 (×3): qty 1

## 2014-02-03 MED ORDER — CALCIUM-VITAMIN D 600-200 MG-UNIT PO CAPS: 1.0000 | ORAL_CAPSULE | Freq: Every day | ORAL | Status: DC

## 2014-02-03 MED ORDER — LEVOTHYROXINE SODIUM 88 MCG PO TABS
88.0000 ug | ORAL_TABLET | Freq: Every day | ORAL | Status: DC
  Administered 2014-02-04 – 2014-02-05 (×2): 88 ug via ORAL
  Filled 2014-02-03 (×3): qty 1

## 2014-02-03 MED ORDER — HEPARIN SODIUM (PORCINE) 5000 UNIT/ML IJ SOLN: 5000.0000 [IU] | Freq: Three times a day (TID) | INTRAMUSCULAR | Status: DC

## 2014-02-03 MED ORDER — VITAMIN C 500 MG PO TABS
500.0000 mg | ORAL_TABLET | Freq: Every day | ORAL | Status: DC
  Administered 2014-02-04 – 2014-02-08 (×5): 500 mg via ORAL
  Filled 2014-02-03 (×5): qty 1

## 2014-02-03 MED ORDER — SODIUM CHLORIDE 0.9 % IV SOLN: 250.0000 mL | INTRAVENOUS | Status: DC | PRN

## 2014-02-03 MED ORDER — GABAPENTIN 300 MG PO CAPS
300.0000 mg | ORAL_CAPSULE | Freq: Every day | ORAL | Status: DC
  Administered 2014-02-04 – 2014-02-07 (×4): 300 mg via ORAL
  Filled 2014-02-03 (×5): qty 1

## 2014-02-03 MED ORDER — INSULIN ASPART 100 UNIT/ML ~~LOC~~ SOLN
0.0000 [IU] | Freq: Three times a day (TID) | SUBCUTANEOUS | Status: DC
Start: 2014-02-04 — End: 2014-02-08
  Administered 2014-02-04 – 2014-02-06 (×2): 1 [IU] via SUBCUTANEOUS
  Administered 2014-02-06: 2 [IU] via SUBCUTANEOUS
  Administered 2014-02-06 – 2014-02-08 (×3): 1 [IU] via SUBCUTANEOUS

## 2014-02-03 MED ORDER — SODIUM CHLORIDE 0.9 % IJ SOLN: 3.0000 mL | INTRAMUSCULAR | Status: DC | PRN

## 2014-02-03 MED ORDER — ATORVASTATIN CALCIUM 10 MG PO TABS
10.0000 mg | ORAL_TABLET | Freq: Every day | ORAL | Status: DC
  Administered 2014-02-04: 10 mg via ORAL
  Filled 2014-02-03 (×2): qty 1

## 2014-02-03 MED ORDER — DEXTROSE 5 % IV SOLN
2.0000 g | Freq: Three times a day (TID) | INTRAVENOUS | Status: DC
  Administered 2014-02-04 (×3): 2 g via INTRAVENOUS
  Filled 2014-02-03 (×5): qty 2

## 2014-02-03 MED ORDER — ADULT MULTIVITAMIN W/MINERALS CH
1.0000 | ORAL_TABLET | Freq: Every day | ORAL | Status: DC
  Administered 2014-02-04 – 2014-02-08 (×5): 1 via ORAL
  Filled 2014-02-03 (×5): qty 1

## 2014-02-03 MED ORDER — VANCOMYCIN HCL IN DEXTROSE 750-5 MG/150ML-% IV SOLN
750.0000 mg | INTRAVENOUS | Status: DC
  Administered 2014-02-05 – 2014-02-07 (×3): 750 mg via INTRAVENOUS
  Filled 2014-02-03 (×3): qty 150

## 2014-02-03 MED ORDER — DABIGATRAN ETEXILATE MESYLATE 75 MG PO CAPS
75.0000 mg | ORAL_CAPSULE | Freq: Two times a day (BID) | ORAL | Status: DC
  Administered 2014-02-04 – 2014-02-06 (×7): 75 mg via ORAL
  Filled 2014-02-03 (×9): qty 1

## 2014-02-03 MED ORDER — METOPROLOL SUCCINATE 12.5 MG HALF TABLET
12.5000 mg | ORAL_TABLET | Freq: Every day | ORAL | Status: DC
  Administered 2014-02-04 – 2014-02-08 (×5): 12.5 mg via ORAL
  Filled 2014-02-03 (×5): qty 1

## 2014-02-03 MED ORDER — DEXTROSE 5 % IV SOLN
2.0000 g | Freq: Once | INTRAVENOUS | Status: DC
Start: 2014-02-03 — End: 2014-02-03

## 2014-02-03 MED ORDER — SODIUM CHLORIDE 0.9 % IJ SOLN
3.0000 mL | Freq: Two times a day (BID) | INTRAMUSCULAR | Status: DC
  Administered 2014-02-03 – 2014-02-05 (×4): 3 mL via INTRAVENOUS

## 2014-02-03 MED ORDER — FUROSEMIDE 20 MG PO TABS
20.0000 mg | ORAL_TABLET | Freq: Two times a day (BID) | ORAL | Status: DC
  Filled 2014-02-03 (×3): qty 1

## 2014-02-03 MED ORDER — ZOLPIDEM TARTRATE 5 MG PO TABS: 5.0000 mg | ORAL_TABLET | Freq: Every evening | ORAL | Status: DC | PRN

## 2014-02-03 NOTE — Progress Notes (Signed)
ANTIBIOTIC CONSULT NOTE - INITIAL  Pharmacy Consult for Vancomycin  Indication: rule out pneumonia  Allergies  Allergen Reactions  . Aspirin Other (See Comments)    Tears my stomach up; "can take coated Aspirin qd without problems"  . Nexium [Esomeprazole Magnesium] Rash  . Penicillins Rash    Tolerates Rocephin    Patient Measurements: Height: 5' 6.93" (170 cm) Weight: 156 lb 8.4 oz (71 kg) IBW/kg (Calculated) : 61.44  Vital Signs: BP: 107/60 mmHg (04/07 2214) Pulse Rate: 74 (04/07 2214) Intake/Output from previous day:   Intake/Output from this shift: Total I/O In: 500 [I.V.:500] Out: -   Labs:  Recent Labs  02/03/14 2158  WBC 11.0*  HGB 11.9*  PLT 204  CREATININE 1.18*   Estimated Creatinine Clearance: 32.6 ml/min (by C-G formula based on Cr of 1.18). No results found for this basename: Rolm GalaVANCOTROUGH, VANCOPEAK, VANCORANDOM, GENTTROUGH, GENTPEAK, GENTRANDOM, TOBRATROUGH, TOBRAPEAK, TOBRARND, AMIKACINPEAK, AMIKACINTROU, AMIKACIN,  in the last 72 hours   Microbiology: Recent Results (from the past 720 hour(s))  URINE CULTURE     Status: None   Collection Time    01/12/14  1:03 AM      Result Value Ref Range Status   Specimen Description URINE, CLEAN CATCH   Final   Special Requests NONE   Final   Culture  Setup Time     Final   Value: 01/12/2014 01:19     [facility]   Colony Count     Final   Value: 70,000 COLONIES/ML     [facility]   Culture     Final   Value: ENTEROCOCCUS SPECIES     [facility]   Report Status 01/13/2014 FINAL   Final   Organism ID, Bacteria ENTEROCOCCUS SPECIES   Final  URINE CULTURE     Status: None   Collection Time    01/23/14 11:00 AM      Result Value Ref Range Status   Specimen Description URINE, CLEAN CATCH   Final   Special Requests NONE   Final   Culture  Setup Time     Final   Value: 01/23/2014 11:53     [facility]formed at [facility]   Colony  Count     Final   Value: >=100,000 COLONIES/ML     [facility]   Culture     Final   Value: ENTEROCOCCUS SPECIES     [facility]   Report Status 01/25/2014 FINAL   Final   Organism ID, Bacteria ENTEROCOCCUS SPECIES   Final  Assessment: 80 yo female with HCAP for empiric antibiotics.    Goal of Therapy:  Vancomycin trough level 15-20 mcg/ml  Plan:  Vancomycin 1 gIV as ordered in ED, then 750 mg IV q24h  Eddie Candlebbott, Riannon Mukherjee [place] 02/03/2014,11:46 PM

## 2014-02-03 NOTE — ED Notes (Signed)
I stat lactic acid results given to Dr. Walden by B. Temara Lanum, EMT 

## 2014-02-03 NOTE — ED Provider Notes (Signed)
CSN: [id number]     Arrival date & time 02/03/14  2110 History   First MD Initiated Contact with Patient 02/03/14 2123     Chief Complaint  Patient presents with  . Fall     (Consider location/radiation/quality/duration/timing/severity/associated sxs/prior Treatment) HPI Comments: Increasing weakness over past day. Knees giving out, EMS visited multiple times to help her get up. No head injury or LOC. No vomiting, no CP, SOB, N/V/D.  Patient is a 80 y.o. female presenting with fall. The history is provided by the patient.  Fall This is a new problem. The current episode started 1 to 2 hours ago. The problem occurs daily. The problem has been gradually worsening. Pertinent negatives include no chest pain, no abdominal pain, no headaches and no shortness of breath. Nothing aggravates the symptoms. Nothing relieves the symptoms.    Past Medical History  Diagnosis Date  . Hypertension   . Peripheral neuropathy   . Asthma   . High cholesterol   . Diabetes with neurologic complications   . PAD (peripheral artery disease)   . Enlarged heart   . Anginal pain 05/28/12  . Pneumonia 11/2010  . Sleep apnea     cpap  . Hypothyroidism   . Headache(784.0) 05/29/12    "recently"  . Stroke 05/2005; 08/2010    !mini"  . Personal history of gout   . Hypothyroidism   . DJD (degenerative joint disease)   . Carpal tunnel syndrome, bilateral   . Recurrent labyrinthitis   . GERD (gastroesophageal reflux disease)     S/P esophageal dilation in 1996  . Gallstone     Silent  . Fibrocystic breast changes   . Hemorrhoids     with bleeding  . Hx of adenomatous colonic polyps     Dr Sherin Quarry  . Diverticulitis     left side  . MR (mitral regurgitation)     moderate  . Mild anemia     hemoglobin 11.8 on 06/2009  . Edema of lower extremity     Chronic  . Ischemic colitis 8/13  . Aortic stenosis, moderate   . Chronic diastolic CHF (congestive heart failure)   . Moderate mitral regurgitation   .  CKD (chronic kidney disease) stage 3, GFR 30-59 ml/min     "something on labs always say she has some problems"  . Chronic atrial fibrillation    Past Surgical History  Procedure Laterality Date  . Tonsillectomy and adenoidectomy  1960's  . Dilation and curettage of uterus    . Vaginal hysterectomy  1970's  . Total knee arthroplasty  1970-~2002    left; right  . Cataract extraction w/ intraocular lens  implant, bilateral  ?1970's   Family History  Problem Relation Age of Onset  . Heart disease Mother   . Heart disease Father   . Heart disease Brother    History  Substance Use Topics  . Smoking status: Never Smoker   . Smokeless tobacco: Never Used  . Alcohol Use: No   OB History   Grav Para Term Preterm Abortions TAB SAB Ect Mult Living                 Review of Systems  Constitutional: Negative for fever and chills.  Respiratory: Negative for shortness of breath.   Cardiovascular: Negative for chest pain.  Gastrointestinal: Negative for vomiting and abdominal pain.  Neurological: Negative for headaches.  All other systems reviewed and are negative.      Allergies  Aspirin;  Nexium; and Penicillins  Home Medications   Current Outpatient Rx  Name  Route  Sig  Dispense  Refill  . acetaminophen (TYLENOL) 500 MG tablet   Oral   Take 500 mg by mouth every 6 (six) hours as needed for pain.         Marland Kitchen allopurinol (ZYLOPRIM) 300 MG tablet   Oral   Take 1 tablet (300 mg total) by mouth daily.   60 tablet   1   . amiodarone (PACERONE) 200 MG tablet   Oral   Take 1 tablet (200 mg total) by mouth daily.   30 tablet   1   . atorvastatin (LIPITOR) 10 MG tablet   Oral   Take 1 tablet (10 mg total) by mouth daily.   30 tablet   1   . Calcium Carbonate-Vitamin D (CALCIUM-VITAMIN D) 600-200 MG-UNIT CAPS   Oral   Take 1 tablet by mouth daily.          . dabigatran (PRADAXA) 75 MG CAPS capsule   Oral   Take 1 capsule (75 mg total) by mouth every 12 (twelve)  hours.   60 capsule   1   . furosemide (LASIX) 20 MG tablet   Oral   Take 1 tablet (20 mg total) by mouth 2 (two) times daily.   60 tablet   1   . gabapentin (NEURONTIN) 300 MG capsule   Oral   Take 1 capsule (300 mg total) by mouth at bedtime.   30 capsule   1   . levothyroxine (SYNTHROID, LEVOTHROID) 88 MCG tablet   Oral   Take 1 tablet (88 mcg total) by mouth daily.   30 tablet   1   . metoprolol succinate (TOPROL-XL) 25 MG 24 hr tablet   Oral   Take 0.5 tablets (12.5 mg total) by mouth daily.   15 tablet   1   . Multiple Vitamins-Minerals (MULTIVITAMIN WITH MINERALS) tablet   Oral   Take 1 tablet by mouth daily.         . Omega-3 Fatty Acids (FISH OIL) 1000 MG CAPS   Oral   Take 1,000 mg by mouth daily.          Marland Kitchen oxybutynin (DITROPAN) 5 MG tablet   Oral   Take 2 tablets (10 mg total) by mouth at bedtime.   60 tablet   1   . polycarbophil (FIBERCON) 625 MG tablet   Oral   Take 625 mg by mouth 2 (two) times daily.         . potassium chloride (K-DUR,KLOR-CON) 10 MEQ tablet   Oral   Take 1 tablet (10 mEq total) by mouth 2 (two) times daily.   60 tablet   1   . vitamin C (ASCORBIC ACID) 500 MG tablet   Oral   Take 500 mg by mouth daily.         . zaleplon (SONATA) 5 MG capsule   Oral   Take 2 capsules (10 mg total) by mouth at bedtime.   30 capsule   1    BP 107/60  Pulse 74  Resp 14  SpO2 98% Physical Exam  Nursing note and vitals reviewed. Constitutional: She is oriented to person, place, and time. She appears well-developed and well-nourished. No distress.  HENT:  Head: Normocephalic and atraumatic.  Eyes: EOM are normal. Pupils are equal, round, and reactive to light.  Neck: Normal range of motion. Neck supple.  Cardiovascular: Normal rate and  regular rhythm.  Exam reveals no friction rub.   No murmur heard. Pulmonary/Chest: Effort normal and breath sounds normal. No respiratory distress. She has no wheezes. She has no rales.   Abdominal: Soft. She exhibits no distension. There is no tenderness. There is no rebound.  Musculoskeletal: Normal range of motion. She exhibits edema (1+, bilateral).  Neurological: She is alert and oriented to person, place, and time.  Skin: No rash noted. She is not diaphoretic.    ED Course  Procedures (including critical care time) Labs Review Labs Reviewed  CBC - Abnormal; Notable for the following:    WBC 11.0 (*)    RBC 3.67 (*)    Hemoglobin 11.9 (*)    HCT 35.7 (*)    RDW 16.3 (*)    All other components within normal limits  BASIC METABOLIC PANEL - Abnormal; Notable for the following:    Sodium 133 (*)    Chloride 91 (*)    Glucose, Bld 161 (*)    BUN 31 (*)    Creatinine, Ser 1.18 (*)    GFR calc non Af Amer 40 (*)    GFR calc Af Amer 47 (*)    All other components within normal limits  URINALYSIS, ROUTINE W REFLEX MICROSCOPIC - Abnormal; Notable for the following:    APPearance CLOUDY (*)    Nitrite POSITIVE (*)    Leukocytes, UA MODERATE (*)    All other components within normal limits  PRO B NATRIURETIC PEPTIDE - Abnormal; Notable for the following:    Pro B Natriuretic peptide (BNP) 2950.0 (*)    All other components within normal limits  URINE MICROSCOPIC-ADD ON - Abnormal; Notable for the following:    Bacteria, UA MANY (*)    All other components within normal limits  I-STAT CG4 LACTIC ACID, ED - Abnormal; Notable for the following:    Lactic Acid, Venous 2.21 (*)    All other components within normal limits  CULTURE, BLOOD (ROUTINE X 2)  CULTURE, BLOOD (ROUTINE X 2)  CULTURE, BLOOD (ROUTINE X 2)  CULTURE, BLOOD (ROUTINE X 2)  CULTURE, EXPECTORATED SPUTUM-ASSESSMENT  GRAM STAIN  HIV ANTIBODY (ROUTINE TESTING)  LEGIONELLA ANTIGEN, URINE  STREP PNEUMONIAE URINARY ANTIGEN  CBC  CREATININE, SERUM  COMPREHENSIVE METABOLIC PANEL  CBC WITH DIFFERENTIAL  I-STAT TROPOININ, ED   Imaging Review Dg Chest 2 View  02/03/2014   CLINICAL DATA:  Multiple  falls, with generalized weakness.  EXAM: CHEST  2 VIEW  COMPARISON:  Chest radiograph performed 01/23/2014  FINDINGS: The lungs are well-aerated. Vascular congestion is noted, with mildly increased interstitial markings. Left basilar airspace opacification is noted, with a persistent small left pleural effusion, slightly improved from the prior study. No pneumothorax is seen.  The heart is mildly enlarged. Calcification is noted within the aortic arch. No acute osseous abnormalities are seen.  IMPRESSION: Vascular congestion and mild cardiomegaly, with mildly increased interstitial markings. Left basilar airspace opacity noted, with a residual small left pleural effusion. This may reflect mild persistent pulmonary edema or possibly pneumonia. No displaced rib fracture seen.   Electronically Signed   By: Roanna Raider M.D.   On: 02/03/2014 22:11     EKG Interpretation   Date/Time:  Tuesday February 03 2014 21:15:29 EDT Ventricular Rate:  86 PR Interval:  58 QRS Duration: 173 QT Interval:  539 QTC Calculation: 645 R Axis:   113 Text Interpretation:  Sinus rhythm Short PR interval Probable left atrial  enlargement RBBB and LPFB Borderline  ST depression, lateral leads EKG  similar to prior Confirmed by [facility]  MD, Mildred Tuccillo ([id number]) on 02/03/2014 9:25:15  PM      MDM   Final diagnoses:  Healthcare-associated pneumonia  UTI (urinary tract infection)    59F presents with weakness. Multiple falls today because her legs are giving out. Recent prolonged stay due to UTI and 2 week stint in rehab. Denies CP, SOB, fever, N/V/D. Here hypoxic - 87% on room air. Normotensive, no tachycardia. Alert and oriented, relaxing comfortably.  EKG benign. Labs show mild leukocytosis, CXR with PNA, UA with UTI. Given Vanc/Cefepime, admitted to medicine.    Dagmar HaitWilliam Mylz Yuan, MD 02/03/14 2322

## 2014-02-03 NOTE — ED Notes (Signed)
Patient transported to X-ray 

## 2014-02-03 NOTE — ED Notes (Signed)
Pt. arrived with EMS from home reports fall x3 today with generalized weakness/ bilateral leg weakness , denies injury or fall , alert and oriented.

## 2014-02-03 NOTE — H&P (Signed)
Hospitalist Admission History and Physical  Patient name: [name] Medical record number: [id number] Date of birth: 1924/07/25 Age: 80 y.o. Gender: female  Primary Care Provider: Ginette Otto, MD  Chief Complaint: weakness, dyspnea   History of Present Illness:This is a 80 y.o. year old female with prior hx/o HTN, DM, afib, diastolic CHF, moderate AS, MR, stage 3 CKD presenting with weakness, dyspnea, found to have LLL PNA, mild CHF exacerbation. Pt noted to have been recently discharged from inpt rehab s/p hospitalization for acute resp failure 2/2 CHF exacerbation and UTI. Please see discharge summaries for full details. Per the family, pt was discharged from inpt rehab last week. Pt developed persisent weakness since this am. Pt states that legs gave away on her twice today. Did fall, though no LOC. Denies any head trauma. Has had some persistent dyspnea over course of the day as well as progressive LE swelling. Has been compliant with home lasix. Denies heavy salt or NSAID use. Denies any CP, nausea, diaphoresis, fever, chills. Has had some predominant L sided/LLE weakness over time frame. Denies slurred speech, confusion. No dysuria. No diarrhea.  In the ER, pt hemodynamically stable. Satting > 98% on RA. WBC @ 11. Hgb, Cr, Na otherwise at relative baseline. Pro BNP @ 2950.  CXR showing vascular congestion, cardiomegaly, L basilar airspace disease concerning for PNA. Pt started on HCAP by EDP w/ vanc and cefepime.    Assessment and Plan: [name] is a 80 y.o. year old female presenting with weakness, dyspnea   Weakness/Dyspnea: likely multifactorial w/ contributions from CHF exacerbation and HCAP. Start on vanc and aztreonam (PCN allergy). Urine strep and legionella. Blood and sputum cx. Trend WBC. Lasix 20mg  IV x1. Watch diuresis. Weight 156.9 today (wt 158 lbs during last admission for CHF exacerbation). Continue home lasix regimen. Fluid/sodium restricted diet. IVF @  KVO. Check MRI brain given ? LLE/L sided weakness. Reassuring neuro exam today. Neuro consult/work up if indicative.   Afib: Rate controlled today. Hemodynamically stable. Continue amio, metoprolol, pradaxa.   Dm: SSI. A1C.   CKD: Cr at baseline. Trend renal function w/ diuresis. Continue to follow.   FEN/GI: heart healthy, low sodium, fluid restricted diet. PPI.  Prophylaxis: pradaxa  Disposition: pending further evaluation.  Code Status:Full Code     Patient Active Problem List   Diagnosis Date Noted  . Weakness 02/03/2014  . Acute systolic heart failure 01/14/2014  . CHF (congestive heart failure) 01/12/2014  . ASD (atrial septal defect)   . Aortic stenosis, moderate   . Moderate mitral regurgitation   . HTN (hypertension) 10/27/2013  . Aortic valve disorders 10/27/2013  . Gout 10/02/2013  . Abnormality of gait 04/17/2013  . Abnormal PFT 01/28/2013  . Diabetes with neurologic complications   . Physical deconditioning 06/06/2012  . OSA (obstructive sleep apnea) 06/04/2012  . Chronic atrial fibrillation   . Diastolic heart failure, NYHA class 1   . Hypothyroidism   . Asthma    Past Medical History: Past Medical History  Diagnosis Date  . Hypertension   . Peripheral neuropathy   . Asthma   . High cholesterol   . Diabetes with neurologic complications   . PAD (peripheral artery disease)   . Enlarged heart   . Anginal pain 05/28/12  . Pneumonia 11/2010  . Sleep apnea     cpap  . Hypothyroidism   . Headache(784.0) 05/29/12    "recently"  . Stroke 05/2005; 08/2010    !mini"  . Personal history  of gout   . Hypothyroidism   . DJD (degenerative joint disease)   . Carpal tunnel syndrome, bilateral   . Recurrent labyrinthitis   . GERD (gastroesophageal reflux disease)     S/P esophageal dilation in 1996  . Gallstone     Silent  . Fibrocystic breast changes   . Hemorrhoids     with bleeding  . Hx of adenomatous colonic polyps     Dr Sherin Quarry  . Diverticulitis      left side  . MR (mitral regurgitation)     moderate  . Mild anemia     hemoglobin 11.8 on 06/2009  . Edema of lower extremity     Chronic  . Ischemic colitis 8/13  . Aortic stenosis, moderate   . Chronic diastolic CHF (congestive heart failure)   . Moderate mitral regurgitation   . CKD (chronic kidney disease) stage 3, GFR 30-59 ml/min     "something on labs always say she has some problems"  . Chronic atrial fibrillation     Past Surgical History: Past Surgical History  Procedure Laterality Date  . Tonsillectomy and adenoidectomy  1960's  . Dilation and curettage of uterus    . Vaginal hysterectomy  1970's  . Total knee arthroplasty  1970-~2002    left; right  . Cataract extraction w/ intraocular lens  implant, bilateral  ?1970's    Social History: History   Social History  . Marital Status: Widowed    Spouse Name: N/A    Number of Children: 3  . Years of Education: N/A   Occupational History  . RETIRED     [employer]   Social History Main Topics  . Smoking status: Never Smoker   . Smokeless tobacco: Never Used  . Alcohol Use: No  . Drug Use: No  . Sexual Activity: No   Other Topics Concern  . None   Social History Narrative   Retired. Lives alone. 3 adult children. No tobacco use. Nondrinker. No illicit drug use.     Family History: Family History  Problem Relation Age of Onset  . Heart disease Mother   . Heart disease Father   . Heart disease Brother     Allergies: Allergies  Allergen Reactions  . Aspirin Other (See Comments)    Tears my stomach up; "can take coated Aspirin qd without problems"  . Nexium [Esomeprazole Magnesium] Rash  . Penicillins Rash    Tolerates Rocephin    Current Facility-Administered Medications  Medication Dose Route Frequency Provider Last Rate Last Dose  . 0.9 %  sodium chloride infusion  250 mL Intravenous PRN Doree Albee, MD      . acetaminophen (TYLENOL) tablet 500 mg  500 mg Oral Q6H PRN Doree Albee, MD       . Melene Muller ON 02/04/2014] allopurinol (ZYLOPRIM) tablet 300 mg  300 mg Oral Daily Doree Albee, MD      . Melene Muller ON 02/04/2014] amiodarone (PACERONE) tablet 200 mg  200 mg Oral Daily Doree Albee, MD      . Melene Muller ON 02/04/2014] atorvastatin (LIPITOR) tablet 10 mg  10 mg Oral Daily Doree Albee, MD      . aztreonam (AZACTAM) 2 g in dextrose 5 % 50 mL IVPB  2 g Intravenous 3 times per day Doree Albee, MD      . Melene Muller ON 02/04/2014] Calcium-Vitamin D 600-200 MG-UNIT CAPS 1 tablet  1 tablet Oral Daily Doree Albee, MD      . dabigatran (PRADAXA) capsule 75  mg  75 mg Oral Q12H Doree Albee, MD      . furosemide (LASIX) injection 20 mg  20 mg Intravenous Once Doree Albee, MD      . Melene Muller ON 02/04/2014] furosemide (LASIX) tablet 20 mg  20 mg Oral BID Doree Albee, MD      . gabapentin (NEURONTIN) capsule 300 mg  300 mg Oral QHS Doree Albee, MD      . Melene Muller ON 02/04/2014] levothyroxine (SYNTHROID, LEVOTHROID) tablet 88 mcg  88 mcg Oral Daily Doree Albee, MD      . Melene Muller ON 02/04/2014] metoprolol succinate (TOPROL-XL) 24 hr tablet 12.5 mg  12.5 mg Oral Daily Doree Albee, MD      . Melene Muller ON 02/04/2014] multivitamin with minerals tablet 1 tablet  1 tablet Oral Daily Doree Albee, MD      . omega-3 acid ethyl esters (LOVAZA) capsule 1 g  1 g Oral BID Doree Albee, MD      . oxybutynin (DITROPAN) tablet 10 mg  10 mg Oral QHS Doree Albee, MD      . potassium chloride SA (K-DUR,KLOR-CON) CR tablet 10 mEq  10 mEq Oral BID Doree Albee, MD      . sodium chloride 0.9 % injection 3 mL  3 mL Intravenous Q12H Doree Albee, MD      . sodium chloride 0.9 % injection 3 mL  3 mL Intravenous PRN Doree Albee, MD      . vancomycin (VANCOCIN) IVPB 1000 mg/200 mL premix  1,000 mg Intravenous Once Dagmar Hait, MD      . Melene Muller ON 02/04/2014] vitamin C (ASCORBIC ACID) tablet 500 mg  500 mg Oral Daily Doree Albee, MD      . zolpidem (AMBIEN) tablet 5 mg  5 mg Oral QHS PRN Doree Albee, MD       Current  Outpatient Prescriptions  Medication Sig Dispense Refill  . acetaminophen (TYLENOL) 500 MG tablet Take 500 mg by mouth every 6 (six) hours as needed for pain.      Marland Kitchen allopurinol (ZYLOPRIM) 300 MG tablet Take 1 tablet (300 mg total) by mouth daily.  60 tablet  1  . amiodarone (PACERONE) 200 MG tablet Take 1 tablet (200 mg total) by mouth daily.  30 tablet  1  . atorvastatin (LIPITOR) 10 MG tablet Take 1 tablet (10 mg total) by mouth daily.  30 tablet  1  . Calcium Carbonate-Vitamin D (CALCIUM-VITAMIN D) 600-200 MG-UNIT CAPS Take 1 tablet by mouth daily.       . dabigatran (PRADAXA) 75 MG CAPS capsule Take 1 capsule (75 mg total) by mouth every 12 (twelve) hours.  60 capsule  1  . furosemide (LASIX) 20 MG tablet Take 1 tablet (20 mg total) by mouth 2 (two) times daily.  60 tablet  1  . gabapentin (NEURONTIN) 300 MG capsule Take 1 capsule (300 mg total) by mouth at bedtime.  30 capsule  1  . levothyroxine (SYNTHROID, LEVOTHROID) 88 MCG tablet Take 1 tablet (88 mcg total) by mouth daily.  30 tablet  1  . metoprolol succinate (TOPROL-XL) 25 MG 24 hr tablet Take 0.5 tablets (12.5 mg total) by mouth daily.  15 tablet  1  . Multiple Vitamins-Minerals (MULTIVITAMIN WITH MINERALS) tablet Take 1 tablet by mouth daily.      . Omega-3 Fatty Acids (FISH OIL) 1000 MG CAPS Take 1,000 mg by mouth daily.       Marland Kitchen oxybutynin (DITROPAN) 5 MG tablet Take 2 tablets (  10 mg total) by mouth at bedtime.  60 tablet  1  . polycarbophil (FIBERCON) 625 MG tablet Take 625 mg by mouth 2 (two) times daily.      . potassium chloride (K-DUR,KLOR-CON) 10 MEQ tablet Take 1 tablet (10 mEq total) by mouth 2 (two) times daily.  60 tablet  1  . vitamin C (ASCORBIC ACID) 500 MG tablet Take 500 mg by mouth daily.      . zaleplon (SONATA) 5 MG capsule Take 2 capsules (10 mg total) by mouth at bedtime.  30 capsule  1   Review Of Systems: 12 point ROS negative except as noted above in HPI.  Physical Exam: Filed Vitals:   02/03/14 2214   BP: 107/60  Pulse: 74  Resp: 14    General: alert and cooperative HEENT: PERRLA and extra ocular movement intact Heart: S1, S2 normal, no murmur, rub or gallop, regular rate and rhythm Lungs: unlabored breathing and faint rales in lung bases L>R Abdomen: abdomen is soft without significant tenderness, masses, organomegaly or guarding Extremities: 1-2+ edema in LEs bilaterally, 2+ peripheral pulses  Skin:no rashes, no ecchymoses Neurology: normal without focal findings, mental status, speech normal, alert and oriented x3, PERLA and reflexes normal and symmetric  Labs and Imaging: Lab Results  Component Value Date/Time   NA 133* 02/03/2014  9:58 PM   K 5.0 02/03/2014  9:58 PM   CL 91* 02/03/2014  9:58 PM   CO2 26 02/03/2014  9:58 PM   BUN 31* 02/03/2014  9:58 PM   CREATININE 1.18* 02/03/2014  9:58 PM   GLUCOSE 161* 02/03/2014  9:58 PM   Lab Results  Component Value Date   WBC 11.0* 02/03/2014   HGB 11.9* 02/03/2014   HCT 35.7* 02/03/2014   MCV 97.3 02/03/2014   PLT 204 02/03/2014   EKG: SR  Dg Chest 2 View  02/03/2014   CLINICAL DATA:  Multiple falls, with generalized weakness.  EXAM: CHEST  2 VIEW  COMPARISON:  Chest radiograph performed 01/23/2014  FINDINGS: The lungs are well-aerated. Vascular congestion is noted, with mildly increased interstitial markings. Left basilar airspace opacification is noted, with a persistent small left pleural effusion, slightly improved from the prior study. No pneumothorax is seen.  The heart is mildly enlarged. Calcification is noted within the aortic arch. No acute osseous abnormalities are seen.  IMPRESSION: Vascular congestion and mild cardiomegaly, with mildly increased interstitial markings. Left basilar airspace opacity noted, with a residual small left pleural effusion. This may reflect mild persistent pulmonary edema or possibly pneumonia. No displaced rib fracture seen.   Electronically Signed   By: Roanna RaiderJeffery  Chang M.D.   On: 02/03/2014 22:11         Doree AlbeeSteven Adeel Guiffre MD  Pager: [phone]

## 2014-02-03 NOTE — Progress Notes (Signed)
ANTIBIOTIC CONSULT NOTE - INITIAL  Pharmacy Consult for cefepime Indication: rule out pneumonia  Allergies  Allergen Reactions  . Aspirin Other (See Comments)    Tears my stomach up; "can take coated Aspirin qd without problems"  . Nexium [Esomeprazole Magnesium] Rash  . Penicillins Rash    Tolerates Rocephin    Patient Measurements:   Adjusted Body Weight:   Vital Signs: BP: 107/60 mmHg (04/07 2214) Pulse Rate: 74 (04/07 2214) Intake/Output from previous day:   Intake/Output from this shift:    Labs:  Recent Labs  02/03/14 2158  WBC 11.0*  HGB 11.9*  PLT 204  CREATININE 1.18*   The CrCl is unknown because both a height and weight (above a minimum accepted value) are required for this calculation. No results found for this basename: Rolm Gala, VANCORANDOM, GENTTROUGH, GENTPEAK, GENTRANDOM, TOBRATROUGH, TOBRAPEAK, TOBRARND, AMIKACINPEAK, AMIKACINTROU, AMIKACIN,  in the last 72 hours   Microbiology: Recent Results (from the past 720 hour(s))  URINE CULTURE     Status: None   Collection Time    01/12/14  1:03 AM      Result Value Ref Range Status   Specimen Description URINE, CLEAN CATCH   Final   Special Requests NONE   Final   Culture  Setup Time     Final   Value: 01/12/2014 01:19     Performed at [facility] Count     Final   Value: 70,000 COLONIES/ML     Performed at [facility]   Culture     Final   Value: ENTEROCOCCUS SPECIES     Performed at [facility]   Report Status 01/13/2014 FINAL   Final   Organism ID, Bacteria ENTEROCOCCUS SPECIES   Final  URINE CULTURE     Status: None   Collection Time    01/23/14 11:00 AM      Result Value Ref Range Status   Specimen Description URINE, CLEAN CATCH   Final   Special Requests NONE   Final   Culture  Setup Time     Final   Value: 01/23/2014 11:53     Performed at [facility] Count     Final   Value: >=100,000 COLONIES/ML     Performed at  [facility]   Culture     Final   Value: ENTEROCOCCUS SPECIES     Performed at [facility]   Report Status 01/25/2014 FINAL   Final   Organism ID, Bacteria ENTEROCOCCUS SPECIES   Final    Medical History: Past Medical History  Diagnosis Date  . Hypertension   . Peripheral neuropathy   . Asthma   . High cholesterol   . Diabetes with neurologic complications   . PAD (peripheral artery disease)   . Enlarged heart   . Anginal pain 05/28/12  . Pneumonia 11/2010  . Sleep apnea     cpap  . Hypothyroidism   . Headache(784.0) 05/29/12    "recently"  . Stroke 05/2005; 08/2010    !mini"  . Personal history of gout   . Hypothyroidism   . DJD (degenerative joint disease)   . Carpal tunnel syndrome, bilateral   . Recurrent labyrinthitis   . GERD (gastroesophageal reflux disease)     S/P esophageal dilation in 1996  . Gallstone     Silent  . Fibrocystic breast changes   . Hemorrhoids     with bleeding  . Hx of adenomatous  colonic polyps     Dr Sherin QuarryWeissman  . Diverticulitis     left side  . MR (mitral regurgitation)     moderate  . Mild anemia     hemoglobin 11.8 on 06/2009  . Edema of lower extremity     Chronic  . Ischemic colitis 8/13  . Aortic stenosis, moderate   . Chronic diastolic CHF (congestive heart failure)   . Moderate mitral regurgitation   . CKD (chronic kidney disease) stage 3, GFR 30-59 ml/min     "something on labs always say she has some problems"  . Chronic atrial fibrillation     Medications:  Anti-infectives   Start     Dose/Rate Route Frequency Ordered Stop   02/04/14 2300  ceFEPIme (MAXIPIME) 1 g in dextrose 5 % 50 mL IVPB     1 g 100 mL/hr over 30 Minutes Intravenous Every 24 hours 02/03/14 2247     02/03/14 2230  vancomycin (VANCOCIN) IVPB 1000 mg/200 mL premix     1,000 mg 200 mL/hr over 60 Minutes Intravenous  Once 02/03/14 2216     02/03/14 2230  ceFEPIme (MAXIPIME) 2 g in dextrose 5 % 50 mL IVPB     2 g 100 mL/hr over 30  Minutes Intravenous  Once 02/03/14 2220       Assessment: 89 yof presented to the ED with weakness. To start cefepime for pneumonia. Also ordered a 1x dose of vancomycin. Temperature not documented but WBC is 11, Scr slightly elevated at 1.18.   Vanc x 1 4/7 Cefepime 4/7>>  Goal of Therapy:  Eradication of infection  Plan:  1. Cefepime 2gm IV x 1 then 1gm IV Q24H 2. F/u renal fxn, C&S, clinical status and trough at SS 3. F/u continuation of vancomycin   [name], [name] 02/03/2014,10:47 PM

## 2014-02-04 ENCOUNTER — Inpatient Hospital Stay (HOSPITAL_COMMUNITY): Payer: Medicare Other

## 2014-02-04 DIAGNOSIS — I5021 Acute systolic (congestive) heart failure: Secondary | ICD-10-CM

## 2014-02-04 DIAGNOSIS — I10 Essential (primary) hypertension: Secondary | ICD-10-CM

## 2014-02-04 DIAGNOSIS — N179 Acute kidney failure, unspecified: Secondary | ICD-10-CM | POA: Diagnosis present

## 2014-02-04 DIAGNOSIS — I4891 Unspecified atrial fibrillation: Secondary | ICD-10-CM

## 2014-02-04 DIAGNOSIS — E871 Hypo-osmolality and hyponatremia: Secondary | ICD-10-CM | POA: Diagnosis present

## 2014-02-04 LAB — CBC WITH DIFFERENTIAL/PLATELET
BASOS ABS: 0 10*3/uL (ref 0.0–0.1)
Basophils Relative: 0 % (ref 0–1)
EOS PCT: 0 % (ref 0–5)
Eosinophils Absolute: 0 10*3/uL (ref 0.0–0.7)
HEMATOCRIT: 34.9 % — AB (ref 36.0–46.0)
Hemoglobin: 11.8 g/dL — ABNORMAL LOW (ref 12.0–15.0)
LYMPHS ABS: 1.7 10*3/uL (ref 0.7–4.0)
Lymphocytes Relative: 14 % (ref 12–46)
MCH: 32.8 pg (ref 26.0–34.0)
MCHC: 33.8 g/dL (ref 30.0–36.0)
MCV: 96.9 fL (ref 78.0–100.0)
Monocytes Absolute: 1 10*3/uL (ref 0.1–1.0)
Monocytes Relative: 8 % (ref 3–12)
Neutro Abs: 9.5 10*3/uL — ABNORMAL HIGH (ref 1.7–7.7)
Neutrophils Relative %: 78 % — ABNORMAL HIGH (ref 43–77)
Platelets: 192 10*3/uL (ref 150–400)
RBC: 3.6 MIL/uL — ABNORMAL LOW (ref 3.87–5.11)
RDW: 16.3 % — AB (ref 11.5–15.5)
WBC: 12.3 10*3/uL — AB (ref 4.0–10.5)

## 2014-02-04 LAB — GLUCOSE, CAPILLARY
GLUCOSE-CAPILLARY: 134 mg/dL — AB (ref 70–99)
GLUCOSE-CAPILLARY: 107 mg/dL — AB (ref 70–99)
GLUCOSE-CAPILLARY: 154 mg/dL — AB (ref 70–99)
Glucose-Capillary: 101 mg/dL — ABNORMAL HIGH (ref 70–99)

## 2014-02-04 LAB — COMPREHENSIVE METABOLIC PANEL
ALBUMIN: 2.8 g/dL — AB (ref 3.5–5.2)
ALK PHOS: 108 U/L (ref 39–117)
ALT: 37 U/L — ABNORMAL HIGH (ref 0–35)
AST: 62 U/L — ABNORMAL HIGH (ref 0–37)
BUN: 29 mg/dL — AB (ref 6–23)
CO2: 26 mEq/L (ref 19–32)
Calcium: 9 mg/dL (ref 8.4–10.5)
Chloride: 93 mEq/L — ABNORMAL LOW (ref 96–112)
Creatinine, Ser: 1.1 mg/dL (ref 0.50–1.10)
GFR calc Af Amer: 51 mL/min — ABNORMAL LOW (ref >90)
GFR calc non Af Amer: 44 mL/min — ABNORMAL LOW (ref >90)
Glucose, Bld: 119 mg/dL — ABNORMAL HIGH (ref 70–99)
POTASSIUM: 4.1 meq/L (ref 3.7–5.3)
SODIUM: 133 meq/L — AB (ref 137–147)
Total Bilirubin: 0.9 mg/dL (ref 0.3–1.2)
Total Protein: 6.4 g/dL (ref 6.0–8.3)

## 2014-02-04 LAB — HIV ANTIBODY (ROUTINE TESTING W REFLEX): HIV: NONREACTIVE

## 2014-02-04 LAB — HEMOGLOBIN A1C
Hgb A1c MFr Bld: 6.2 % — ABNORMAL HIGH (ref <5.7)
Mean Plasma Glucose: 131 mg/dL — ABNORMAL HIGH (ref <117)

## 2014-02-04 MED ORDER — CALCIUM CARBONATE-VITAMIN D 500-200 MG-UNIT PO TABS
1.0000 | ORAL_TABLET | Freq: Every day | ORAL | Status: DC
  Administered 2014-02-04 – 2014-02-08 (×5): 1 via ORAL
  Filled 2014-02-04 (×6): qty 1

## 2014-02-04 MED ORDER — DEXTROSE 5 % IV SOLN
10.0000 mg/h | INTRAVENOUS | Status: DC
  Administered 2014-02-04: 4 mg/h via INTRAVENOUS
  Administered 2014-02-06: 10 mg/h via INTRAVENOUS
  Filled 2014-02-04 (×3): qty 25

## 2014-02-04 MED ORDER — DEXTROSE 5 % IV SOLN
1.0000 g | Freq: Three times a day (TID) | INTRAVENOUS | Status: DC
  Administered 2014-02-04 – 2014-02-07 (×8): 1 g via INTRAVENOUS
  Filled 2014-02-04 (×14): qty 1

## 2014-02-04 NOTE — ED Notes (Signed)
Report given to floor nurse , transported instable condition , respirations unlabored , IV site intact , denies pain .

## 2014-02-04 NOTE — Progress Notes (Signed)
Utilization review completed.  

## 2014-02-04 NOTE — Progress Notes (Signed)
[facility]  Patient Status: Active (receiving services up to time of hospitalization)  [facility] is providing the following services: RN and PT  If patient discharges after hours, please call [phone].   Kizzie FurnishDonna Fellmy 02/04/2014, 1:03 PM

## 2014-02-04 NOTE — Progress Notes (Signed)
[facility] PROGRESS NOTE  Filed Weights   02/03/14 2300 02/04/14 0250  Weight: 71 kg (156 lb 8.4 oz) 76.068 kg (167 lb 11.2 oz)        Intake/Output Summary (Last 24 hours) at 02/04/14 0945 Last data filed at 02/04/14 6720  Gross per 24 hour  Intake    860 ml  Output      0 ml  Net    860 ml     Assessment/Plan:  Acute systolic heart failure - Weight on d/c was 73 kg. Now 76kg. D/c from CIR without a diuretic. - Start lasix infusion, strict I and O's. Daily weights. - cont beta blocker. Low sodium diet. - 3rd admission in 6 months  Hyponatremia: - due to heart failure. - will improve with diuresis.  AKI (acute kidney injury): - due to heart failure. - Now improved with diuresis. - b-met in am.   Aortic stenosis, moderate - Murmur present    Code Status: Full  Family Communication: Pt at bedside  Disposition Plan: Admit for further evaluation     Consultants:  none  Procedures: ECHO: 3.18.2015: ejection fraction was in the range of 55% to 65%. Wall motion was normal; there were no regional wall motion Abnormalities. - Aortic valve: There was very mild stenosis. Valve area: 1.09cm^2(VTI). Valve area: 1.06cm^2 (Vmax). - Mitral valve: The findings are consistent with mild stenosis. Moderate regurgitation. Valve area by continuity equation (using LVOT flow): 1.18cm^2.    Antibiotics:  none  HPI/Subjective: No complains  Objective: Filed Vitals:   02/03/14 2300 02/04/14 0001 02/04/14 0006 02/04/14 0250  BP:   120/73 123/77  Pulse:   95 78  Temp:  98.2 F (36.8 C)  97.7 F (36.5 C)  TempSrc:    Oral  Resp:   14 18  Height: 5' 6.93" (1.7 m)   '5\' 7"'$  (1.702 m)  Weight: 71 kg (156 lb 8.4 oz)   76.068 kg (167 lb 11.2 oz)  SpO2:   99% 99%     Exam:  General: Alert, awake, oriented x3, in no acute distress.  HEENT: No bruits, no goiter. +JVD Heart: Regular rate and rhythm, without murmurs, rubs, gallops.  Lungs: Good air movement, clear    Abdomen: Soft, nontender, nondistended, positive bowel sounds.    Data Reviewed: Basic Metabolic Panel:  Recent Labs Lab 01/29/14 0740 01/30/14 0515 01/31/14 0535 02/03/14 2158 02/04/14 0008  NA 135* 133* 137 133* 133*  K 3.9 3.7 3.8 5.0 4.1  CL 95* 93* 94* 91* 93*  CO2 $Re'25 30 31 26 26  'jNn$ GLUCOSE 75 89 85 161* 119*  BUN $Re'21 20 21 'uyW$ 31* 29*  CREATININE 0.92 1.04 1.11* 1.18* 1.10  CALCIUM 8.3* 8.4 8.4 9.2 9.0  PHOS 3.1 3.5 3.5  --   --    Liver Function Tests:  Recent Labs Lab 01/29/14 0740 01/30/14 0515 01/31/14 0535 02/04/14 0008  AST  --   --   --  62*  ALT  --   --   --  37*  ALKPHOS  --   --   --  108  BILITOT  --   --   --  0.9  PROT  --   --   --  6.4  ALBUMIN 2.2* 2.3* 2.3* 2.8*   No results found for this basename: LIPASE, AMYLASE,  in the last 168 hours No results found for this basename: AMMONIA,  in the last 168 hours CBC:  Recent Labs Lab 02/03/14 2158  02/04/14 0008  WBC 11.0* 12.3*  NEUTROABS  --  9.5*  HGB 11.9* 11.8*  HCT 35.7* 34.9*  MCV 97.3 96.9  PLT 204 192   Cardiac Enzymes: No results found for this basename: CKTOTAL, CKMB, CKMBINDEX, TROPONINI,  in the last 168 hours BNP (last 3 results)  Recent Labs  01/11/14 2206 01/12/14 0455 02/03/14 2158  PROBNP 2822.0* 3584.0* 2950.0*   CBG:  Recent Labs Lab 02/04/14 0816  GLUCAP 101*    No results found for this or any previous visit (from the past 240 hour(s)).   Studies: Dg Chest 2 View  02/03/2014   CLINICAL DATA:  Multiple falls, with generalized weakness.  EXAM: CHEST  2 VIEW  COMPARISON:  Chest radiograph performed 01/23/2014  FINDINGS: The lungs are well-aerated. Vascular congestion is noted, with mildly increased interstitial markings. Left basilar airspace opacification is noted, with a persistent small left pleural effusion, slightly improved from the prior study. No pneumothorax is seen.  The heart is mildly enlarged. Calcification is noted within the aortic arch. No acute  osseous abnormalities are seen.  IMPRESSION: Vascular congestion and mild cardiomegaly, with mildly increased interstitial markings. Left basilar airspace opacity noted, with a residual small left pleural effusion. This may reflect mild persistent pulmonary edema or possibly pneumonia. No displaced rib fracture seen.   Electronically Signed   By: Garald Balding M.D.   On: 02/03/2014 22:11   Mr Brain Wo Contrast  02/04/2014   CLINICAL DATA:  Bilateral weakness, worse on the left. History of CVA.  EXAM: MRI HEAD WITHOUT CONTRAST  TECHNIQUE: Multiplanar, multiecho pulse sequences of the brain and surrounding structures were obtained without intravenous contrast.  COMPARISON:  MRI brain 09/05/2010  FINDINGS: Moderate generalized atrophy and white matter disease has progressed since prior exam. No acute infarct, hemorrhage, or mass lesion is present. And the ventricles are proportionate to the degree of atrophy. No significant extraaxial fluid collection is present.  Flow is present in the major intracranial arteries. Remote lacunar infarcts of the basal ganglia and cerebellum are stable.  Patient is status post bilateral lens replacements. Mild mucosal thickening is present in the right sphenoid sinus. The remaining paranasal sinuses are clear. There is minimal fluid in the mastoid air cells. No obstructing nasopharyngeal lesion is evident.  IMPRESSION: 1. No acute intracranial abnormality. 2. Progressive atrophy and white matter disease likely reflects the sequela of chronic microvascular ischemia. 3. Minimal right sphenoid sinus disease. 4. Small mastoid effusions. No obstructing nasopharyngeal lesion is evident.   Electronically Signed   By: Lawrence Santiago M.D.   On: 02/04/2014 08:51    Scheduled Meds: . allopurinol  300 mg Oral Daily  . amiodarone  200 mg Oral Daily  . atorvastatin  10 mg Oral Daily  . aztreonam  2 g Intravenous 3 times per day  . calcium-vitamin D  1 tablet Oral Q breakfast  . dabigatran   75 mg Oral Q12H  . furosemide  20 mg Oral BID  . gabapentin  300 mg Oral QHS  . insulin aspart  0-9 Units Subcutaneous TID WC  . levothyroxine  88 mcg Oral QAC breakfast  . metoprolol succinate  12.5 mg Oral Daily  . multivitamin with minerals  1 tablet Oral Daily  . omega-3 acid ethyl esters  1 g Oral BID  . oxybutynin  10 mg Oral QHS  . potassium chloride  10 mEq Oral BID  . sodium chloride  3 mL Intravenous Q12H  . [START ON 02/05/2014] vancomycin  750 mg Intravenous Q24H  . vitamin C  500 mg Oral Daily   Continuous Infusions: . furosemide (LASIX) infusion       Charlynne Cousins  [facility] Pager [phone] If 8PM-8AM, please contact night-coverage at [email or web address], password [facility] 02/04/2014, 9:45 AM  LOS: 1 day

## 2014-02-05 DIAGNOSIS — I5030 Unspecified diastolic (congestive) heart failure: Secondary | ICD-10-CM

## 2014-02-05 DIAGNOSIS — R5381 Other malaise: Secondary | ICD-10-CM

## 2014-02-05 DIAGNOSIS — G4733 Obstructive sleep apnea (adult) (pediatric): Secondary | ICD-10-CM

## 2014-02-05 DIAGNOSIS — I359 Nonrheumatic aortic valve disorder, unspecified: Secondary | ICD-10-CM

## 2014-02-05 LAB — GLUCOSE, CAPILLARY
GLUCOSE-CAPILLARY: 132 mg/dL — AB (ref 70–99)
Glucose-Capillary: 103 mg/dL — ABNORMAL HIGH (ref 70–99)
Glucose-Capillary: 93 mg/dL (ref 70–99)
Glucose-Capillary: 102 mg/dL — ABNORMAL HIGH (ref 70–99)
Glucose-Capillary: 120 mg/dL — ABNORMAL HIGH (ref 70–99)
Glucose-Capillary: 113 mg/dL — ABNORMAL HIGH (ref 70–99)

## 2014-02-05 LAB — COMPREHENSIVE METABOLIC PANEL
ALBUMIN: 2.3 g/dL — AB (ref 3.5–5.2)
ALK PHOS: 99 U/L (ref 39–117)
ALT: 36 U/L — AB (ref 0–35)
AST: 81 U/L — AB (ref 0–37)
BILIRUBIN TOTAL: 0.7 mg/dL (ref 0.3–1.2)
BUN: 23 mg/dL (ref 6–23)
CHLORIDE: 94 meq/L — AB (ref 96–112)
CO2: 30 mEq/L (ref 19–32)
Calcium: 8.3 mg/dL — ABNORMAL LOW (ref 8.4–10.5)
Creatinine, Ser: 1.01 mg/dL (ref 0.50–1.10)
GFR calc Af Amer: 56 mL/min — ABNORMAL LOW (ref >90)
GFR calc non Af Amer: 49 mL/min — ABNORMAL LOW (ref >90)
Glucose, Bld: 104 mg/dL — ABNORMAL HIGH (ref 70–99)
Potassium: 4.1 mEq/L (ref 3.7–5.3)
SODIUM: 137 meq/L (ref 137–147)
Total Protein: 5.9 g/dL — ABNORMAL LOW (ref 6.0–8.3)

## 2014-02-05 LAB — LEGIONELLA ANTIGEN, URINE: Legionella Antigen, Urine: NEGATIVE

## 2014-02-05 LAB — CBC WITH DIFFERENTIAL/PLATELET
BASOS ABS: 0 10*3/uL (ref 0.0–0.1)
Basophils Relative: 0 % (ref 0–1)
EOS PCT: 1 % (ref 0–5)
Eosinophils Absolute: 0.1 10*3/uL (ref 0.0–0.7)
HCT: 33.4 % — ABNORMAL LOW (ref 36.0–46.0)
Hemoglobin: 11.1 g/dL — ABNORMAL LOW (ref 12.0–15.0)
Lymphocytes Relative: 19 % (ref 12–46)
Lymphs Abs: 1.9 10*3/uL (ref 0.7–4.0)
MCH: 32.6 pg (ref 26.0–34.0)
MCHC: 33.2 g/dL (ref 30.0–36.0)
MCV: 97.9 fL (ref 78.0–100.0)
Monocytes Absolute: 0.8 10*3/uL (ref 0.1–1.0)
Monocytes Relative: 8 % (ref 3–12)
NEUTROS ABS: 7 10*3/uL (ref 1.7–7.7)
Neutrophils Relative %: 72 % (ref 43–77)
PLATELETS: 193 10*3/uL (ref 150–400)
RBC: 3.41 MIL/uL — ABNORMAL LOW (ref 3.87–5.11)
RDW: 16.5 % — AB (ref 11.5–15.5)
WBC: 9.8 10*3/uL (ref 4.0–10.5)

## 2014-02-05 LAB — STREP PNEUMONIAE URINARY ANTIGEN: Strep Pneumo Urinary Antigen: NEGATIVE

## 2014-02-05 LAB — BASIC METABOLIC PANEL
BUN: 23 mg/dL (ref 6–23)
CHLORIDE: 92 meq/L — AB (ref 96–112)
CO2: 28 mEq/L (ref 19–32)
Calcium: 8.5 mg/dL (ref 8.4–10.5)
Creatinine, Ser: 1.04 mg/dL (ref 0.50–1.10)
GFR calc Af Amer: 54 mL/min — ABNORMAL LOW (ref >90)
GFR calc non Af Amer: 47 mL/min — ABNORMAL LOW (ref >90)
GLUCOSE: 136 mg/dL — AB (ref 70–99)
POTASSIUM: 3.8 meq/L (ref 3.7–5.3)
Sodium: 136 mEq/L — ABNORMAL LOW (ref 137–147)

## 2014-02-05 MED ORDER — LEVOTHYROXINE SODIUM 100 MCG PO TABS
100.0000 ug | ORAL_TABLET | Freq: Every day | ORAL | Status: DC
  Administered 2014-02-06 – 2014-02-08 (×3): 100 ug via ORAL
  Filled 2014-02-05 (×4): qty 1

## 2014-02-05 MED ORDER — NON FORMULARY: 10.0000 mg | Freq: Every evening | Status: DC | PRN

## 2014-02-05 MED ORDER — ATORVASTATIN CALCIUM 10 MG PO TABS
10.0000 mg | ORAL_TABLET | Freq: Every day | ORAL | Status: DC
  Administered 2014-02-05 – 2014-02-07 (×3): 10 mg via ORAL
  Filled 2014-02-05 (×4): qty 1

## 2014-02-05 MED ORDER — ZALEPLON 10 MG PO CAPS
10.0000 mg | ORAL_CAPSULE | Freq: Every day | ORAL | Status: DC
  Administered 2014-02-05 – 2014-02-07 (×3): 10 mg via ORAL
  Filled 2014-02-05: qty 1

## 2014-02-05 NOTE — Evaluation (Signed)
Physical Therapy Evaluation Patient Details Name: [name] MRN: [id number] DOB: 1924-06-08 Today's Date: 02/05/2014   History of Present Illness  This is a 80 y.o. year old female with prior hx/o HTN, DM, afib, diastolic CHF, moderate AS, MR, stage 3 CKD presenting with weakness, dyspnea, found to have LLL PNA, mild CHF exacerbation. Pt noted to have been recently discharged from inpt rehab s/p hospitalization for acute resp failure 2/2 CHF exacerbation and UTI. Please see discharge summaries for full details. Per the family, pt was discharged from inpt rehab last week. Pt developed persisent weakness since this am. Pt states that legs gave away on her twice today. Did fall, though no LOC. Denies any head trauma. Has had some persistent dyspnea over course of the day as well as progressive LE swelling. Has been compliant with home lasix. Denies heavy salt or NSAID use. Denies any CP, nausea, diaphoresis, fever, chills. Has had some predominant L sided/LLE weakness over time frame. Denies slurred speech, confusion. No dysuria. No diarrhea.    Clinical Impression  Pt presents with decreased strength, balance and mobility and will benefit from skilled PT services to address deficits and increase functional independence.  PT reviewed seated HEP with pt to improve strength during her hospital stay.    Follow Up Recommendations Home health PT;Supervision/Assistance - 24 hour    Equipment Recommendations  None recommended by PT    Recommendations for Other Services       Precautions / Restrictions Precautions Precautions: Fall Restrictions Weight Bearing Restrictions: No      Mobility  Bed Mobility               General bed mobility comments: pt in chair at PT arrival  Transfers Overall transfer level: Needs assistance Equipment used: Rolling walker (2 wheeled)   Sit to Stand: Mod assist         General transfer comment: multiple attempts and cues for UE and LE  placement, facilitation for anterior translation   Ambulation/Gait Ambulation/Gait assistance: Min assist Ambulation Distance (Feet): 15 Feet (x 2) Assistive device: Rolling walker (2 wheeled)     Gait velocity interpretation: <1.8 ft/sec, indicative of risk for recurrent falls General Gait Details: pt with small step length, cues to push RW vs pick up.  Pt with very decreased endurance, needing to sit after 15' due to "feel my knees are weak and going to give out"  Stairs            Wheelchair Mobility    Modified Rankin (Stroke Patients Only)       Balance                                             Pertinent Vitals/Pain No c/o pain.  spO2 89% on room air after gait, returns to 90% with 1 minute seated rest    Home Living Family/patient expects to be discharged to:: Private residence Living Arrangements: Other (Comment) (24 hour caregivers) Available Help at Discharge: Personal care attendant;Available 24 hours/day Type of Home: House Home Access: Stairs to enter Entrance Stairs-Rails: Right Entrance Stairs-Number of Steps: 2 Home Layout: One level Home Equipment: Walker - 2 wheels;Walker - 4 wheels;Grab bars - toilet;Grab bars - tub/shower      Prior Function Level of Independence: Needs assistance         Comments: assist to stand, 24 hour  supervision     Hand Dominance        Extremity/Trunk Assessment               Lower Extremity Assessment: Generalized weakness      Cervical / Trunk Assessment: Normal  Communication   Communication: No difficulties  Cognition Arousal/Alertness: Awake/alert Behavior During Therapy: Flat affect Overall Cognitive Status: History of cognitive impairments - at baseline                      General Comments      Exercises        Assessment/Plan    PT Assessment Patient needs continued PT services  PT Diagnosis Difficulty walking;Generalized weakness   PT Problem  List Decreased strength;Decreased activity tolerance;Decreased balance;Decreased mobility;Decreased knowledge of use of DME;Decreased safety awareness;Cardiopulmonary status limiting activity  PT Treatment Interventions DME instruction;Balance training;Gait training;Neuromuscular re-education;Stair training;Functional mobility training;Patient/family education;Wheelchair mobility training;Therapeutic activities;Therapeutic exercise;Modalities   PT Goals (Current goals can be found in the Care Plan section) Acute Rehab PT Goals Patient Stated Goal: return home PT Goal Formulation: With patient/family Time For Goal Achievement: 02/12/14 Potential to Achieve Goals: Good    Frequency Min 3X/week   Barriers to discharge        Co-evaluation               End of Session Equipment Utilized During Treatment: Gait belt Activity Tolerance: Patient limited by fatigue Patient left: in chair;with call bell/phone within reach;with nursing/sitter in room;with family/visitor present Nurse Communication: Mobility status         Time: 1300-1320 PT Time Calculation (min): 20 min   Charges:   PT Evaluation $Initial PT Evaluation Tier I: 1 Procedure PT Treatments $Gait Training: 8-22 mins   PT G Codes:          Leone Brand 02/05/2014, 1:25 PM

## 2014-02-05 NOTE — Care Management Note (Signed)
  Page 1 of 1   02/06/2014     2:55:21 PM   CARE MANAGEMENT NOTE 02/06/2014  Patient:  [name],[name]   Account Number:  [id number]  Date Initiated:  02/05/2014  Documentation initiated by:  [name],[name]  Subjective/Objective Assessment:   Pt admitted with CHF     Action/Plan:   PTA pt lived at home- [facility]-- will need resumption orders at time of discharge   Anticipated DC Date:  02/09/2014   Anticipated DC Plan:  HOME W HOME HEALTH SERVICES      DC Planning Services  CM consult      [facility]/PTA Provider   Choice offered to / List presented to:             Status of service:  In process, will continue to follow Medicare Important Message given?   (If response is "NO", the following Medicare IM given date fields will be blank) Date Medicare IM given:   Date Additional Medicare IM given:    Discharge Disposition:    Per UR Regulation:  Reviewed for med. necessity/level of care/duration of stay  If discussed at Long Length of Stay Meetings, dates discussed:    Comments:  02/06/14- 1445- Judith PieriniKristi Dantae Meunier RN, BSN [phone] Referral to check benefit coverage for Eliquis- per insurance check- pt does have insurance coverage for Eliqius- and pt's cost would be 20% of the cost of drug at [facility]- will need [facility] to [facility] true cost of drug- per pt and family pt has not used mail order in several years- [facility] [place] [facility] [place]. Per family they would like pt's cardiologist Dr. Mayford Knifeurner to be aware and ok with change to Eliquis. NCM to cont. to follow for further assistance per PT - pt has used [facility] past- [facility]- confirmed this with pt and son in room- [facility] - pt will need new [facility] orders and [facility] at time of [facility].

## 2014-02-05 NOTE — Progress Notes (Signed)
[facility] PROGRESS NOTE  Filed Weights   02/03/14 2300 02/04/14 0250  Weight: 71 kg (156 lb 8.4 oz) 76.068 kg (167 lb 11.2 oz)        Intake/Output Summary (Last 24 hours) at 02/05/14 1250 Last data filed at 02/05/14 1205  Gross per 24 hour  Intake    560 ml  Output   4850 ml  Net  -4290 ml     Assessment/Plan:  Acute systolic heart failure - Weight on d/c was 73 kg. Now 76kg. D/c from CIR without a diuretic. - Start lasix infusion, strict I and O's. Daily weights. Good UOP - cont beta blocker. Low sodium diet. - 3rd admission in 6 months  Hyponatremia: - due to heart failure. - will improve with diuresis.  AKI (acute kidney injury): - due to heart failure. - Now improved with diuresis. - b-met in am.   Aortic stenosis, moderate - Murmur present    Code Status: Full  Family Communication: Pt at bedside  Disposition Plan: Admit for further evaluation     Consultants:  none  Procedures: ECHO: 3.18.2015: ejection fraction was in the range of 55% to 65%. Wall motion was normal; there were no regional wall motion Abnormalities. - Aortic valve: There was very mild stenosis. Valve area: 1.09cm^2(VTI). Valve area: 1.06cm^2 (Vmax). - Mitral valve: The findings are consistent with mild stenosis. Moderate regurgitation. Valve area by continuity equation (using LVOT flow): 1.18cm^2.    Antibiotics:  none  HPI/Subjective: No complains  Objective: Filed Vitals:   02/04/14 2118 02/05/14 0624 02/05/14 0633 02/05/14 1020  BP:  109/62 97/51 107/71  Pulse: 80 77 71 88  Temp:  97.9 F (36.6 C) 98.8 F (37.1 C)   TempSrc:  Oral Oral   Resp: _0 Height:      Weight:      SpO2: 97% 97% 100%      Exam:  General: Alert, awake, oriented x3, in no acute distress.  HEENT: No bruits, no goiter. +JVD Heart: Regular rate and rhythm, lower extremity edema. Lungs: Good air movement, clear  Abdomen: Soft, nontender, nondistended, positive bowel  sounds.    Data Reviewed: Basic Metabolic Panel:  Recent Labs Lab 01/30/14 0515 01/31/14 0535 02/03/14 2158 02/04/14 0008 02/05/14 0645  NA 133* 137 133* 133* 137  K 3.7 3.8 5.0 4.1 4.1  CL 93* 94* 91* 93* 94*  CO2 _1 GLUCOSE 89 85 161* 119* 104*  BUN 20 21 31* 29* 23  CREATININE 1.04 1.11* 1.18* 1.10 1.01  CALCIUM 8.4 8.4 9.2 9.0 8.3*  PHOS 3.5 3.5  --   --   --    Liver Function Tests:  Recent Labs Lab 01/30/14 0515 01/31/14 0535 02/04/14 0008 02/05/14 0645  AST  --   --  62* 81*  ALT  --   --  37* 36*  ALKPHOS  --   --  108 99  BILITOT  --   --  0.9 0.7  PROT  --   --  6.4 5.9*  ALBUMIN 2.3* 2.3* 2.8* 2.3*   No results found for this basename: LIPASE, AMYLASE,  in the last 168 hours No results found for this basename: AMMONIA,  in the last 168 hours CBC:  Recent Labs Lab 02/03/14 2158 02/04/14 0008 02/05/14 0645  WBC 11.0* 12.3* 9.8  NEUTROABS  --  9.5* 7.0  HGB 11.9* 11.8* 11.1*  HCT 35.7* 34.9* 33.4*  MCV 97.3 96.9  97.9  PLT 204 192 193   Cardiac Enzymes: No results found for this basename: CKTOTAL, CKMB, CKMBINDEX, TROPONINI,  in the last 168 hours BNP (last 3 results)  Recent Labs  01/11/14 2206 01/12/14 0455 02/03/14 2158  PROBNP 2822.0* 3584.0* 2950.0*   CBG:  Recent Labs Lab 02/04/14 2053 02/05/14 0113 02/05/14 0354 02/05/14 0802 02/05/14 1208  GLUCAP 154* 103* 93 102* 120*    Recent Results (from the past 240 hour(s))  CULTURE, BLOOD (ROUTINE X 2)     Status: None   Collection Time    02/03/14 10:16 PM      Result Value Ref Range Status   Specimen Description BLOOD RIGHT HAND   Final   Special Requests BOTTLES DRAWN AEROBIC ONLY 5CC   Final   Culture  Setup Time     Final   Value: 02/04/2014 08:19     Performed at [facility]   Culture     Final   Value:        BLOOD CULTURE RECEIVED NO GROWTH TO DATE CULTURE WILL BE HELD FOR 5 DAYS BEFORE ISSUING A FINAL NEGATIVE REPORT     Performed at [facility]   Report Status PENDING   Incomplete  CULTURE, BLOOD (ROUTINE X 2)     Status: None   Collection Time    02/03/14 11:59 PM      Result Value Ref Range Status   Specimen Description BLOOD LEFT HAND   Final   Special Requests     Final   Value: BOTTLES DRAWN AEROBIC AND ANAEROBIC 10CC BLUE,3CC RED   Culture  Setup Time     Final   Value: 02/04/2014 08:19     Performed at [facility]   Culture     Final   Value:        BLOOD CULTURE RECEIVED NO GROWTH TO DATE CULTURE WILL BE HELD FOR 5 DAYS BEFORE ISSUING A FINAL NEGATIVE REPORT     Performed at [facility]   Report Status PENDING   Incomplete     Studies: Dg Chest 2 View  02/03/2014   CLINICAL DATA:  Multiple falls, with generalized weakness.  EXAM: CHEST  2 VIEW  COMPARISON:  Chest radiograph performed 01/23/2014  FINDINGS: The lungs are well-aerated. Vascular congestion is noted, with mildly increased interstitial markings. Left basilar airspace opacification is noted, with a persistent small left pleural effusion, slightly improved from the prior study. No pneumothorax is seen.  The heart is mildly enlarged. Calcification is noted within the aortic arch. No acute osseous abnormalities are seen.  IMPRESSION: Vascular congestion and mild cardiomegaly, with mildly increased interstitial markings. Left basilar airspace opacity noted, with a residual small left pleural effusion. This may reflect mild persistent pulmonary edema or possibly pneumonia. No displaced rib fracture seen.   Electronically Signed   By: Garald Balding M.D.   On: 02/03/2014 22:11   Mr Brain Wo Contrast  02/04/2014   CLINICAL DATA:  Bilateral weakness, worse on the left. History of CVA.  EXAM: MRI HEAD WITHOUT CONTRAST  TECHNIQUE: Multiplanar, multiecho pulse sequences of the brain and surrounding structures were obtained without intravenous contrast.  COMPARISON:  MRI brain 09/05/2010  FINDINGS: Moderate generalized atrophy and white matter  disease has progressed since prior exam. No acute infarct, hemorrhage, or mass lesion is present. And the ventricles are proportionate to the degree of atrophy. No significant extraaxial fluid collection is present.  Flow is present in the  major intracranial arteries. Remote lacunar infarcts of the basal ganglia and cerebellum are stable.  Patient is status post bilateral lens replacements. Mild mucosal thickening is present in the right sphenoid sinus. The remaining paranasal sinuses are clear. There is minimal fluid in the mastoid air cells. No obstructing nasopharyngeal lesion is evident.  IMPRESSION: 1. No acute intracranial abnormality. 2. Progressive atrophy and white matter disease likely reflects the sequela of chronic microvascular ischemia. 3. Minimal right sphenoid sinus disease. 4. Small mastoid effusions. No obstructing nasopharyngeal lesion is evident.   Electronically Signed   By: Lawrence Santiago M.D.   On: 02/04/2014 08:51    Scheduled Meds: . allopurinol  300 mg Oral Daily  . amiodarone  200 mg Oral Daily  . atorvastatin  10 mg Oral q1800  . aztreonam  1 g Intravenous 3 times per day  . calcium-vitamin D  1 tablet Oral Q breakfast  . dabigatran  75 mg Oral Q12H  . gabapentin  300 mg Oral QHS  . insulin aspart  0-9 Units Subcutaneous TID WC  . [START ON 02/06/2014] levothyroxine  100 mcg Oral QAC breakfast  . metoprolol succinate  12.5 mg Oral Daily  . multivitamin with minerals  1 tablet Oral Daily  . omega-3 acid ethyl esters  1 g Oral BID  . oxybutynin  10 mg Oral QHS  . potassium chloride  10 mEq Oral BID  . sodium chloride  3 mL Intravenous Q12H  . vancomycin  750 mg Intravenous Q24H  . vitamin C  500 mg Oral Daily  . zaleplon  10 mg Oral QHS   Continuous Infusions: . furosemide (LASIX) infusion 4 mg/hr (02/04/14 1256)     [facility] Pager [phone] If 8PM-8AM, please contact night-coverage at [email or web address], password [facility] 02/05/2014, 12:50  PM  LOS: 2 days

## 2014-02-05 NOTE — Progress Notes (Signed)
RT placed patient on home CPAP and settings. Patient is tolerating at this time with no complications.  RT will continue to monitor.

## 2014-02-06 DIAGNOSIS — I5033 Acute on chronic diastolic (congestive) heart failure: Principal | ICD-10-CM

## 2014-02-06 LAB — COMPREHENSIVE METABOLIC PANEL
ALK PHOS: 104 U/L (ref 39–117)
ALT: 35 U/L (ref 0–35)
AST: 61 U/L — ABNORMAL HIGH (ref 0–37)
Albumin: 2.4 g/dL — ABNORMAL LOW (ref 3.5–5.2)
BILIRUBIN TOTAL: 0.8 mg/dL (ref 0.3–1.2)
BUN: 24 mg/dL — AB (ref 6–23)
CHLORIDE: 94 meq/L — AB (ref 96–112)
CO2: 30 meq/L (ref 19–32)
Calcium: 8.7 mg/dL (ref 8.4–10.5)
Creatinine, Ser: 1.09 mg/dL (ref 0.50–1.10)
GFR, EST AFRICAN AMERICAN: 51 mL/min — AB (ref >90)
GFR, EST NON AFRICAN AMERICAN: 44 mL/min — AB (ref >90)
Glucose, Bld: 84 mg/dL (ref 70–99)
POTASSIUM: 3.7 meq/L (ref 3.7–5.3)
Sodium: 138 mEq/L (ref 137–147)
Total Protein: 6.3 g/dL (ref 6.0–8.3)

## 2014-02-06 LAB — GLUCOSE, CAPILLARY
Glucose-Capillary: 124 mg/dL — ABNORMAL HIGH (ref 70–99)
Glucose-Capillary: 136 mg/dL — ABNORMAL HIGH (ref 70–99)
Glucose-Capillary: 164 mg/dL — ABNORMAL HIGH (ref 70–99)
Glucose-Capillary: 118 mg/dL — ABNORMAL HIGH (ref 70–99)

## 2014-02-06 LAB — CBC WITH DIFFERENTIAL/PLATELET
BASOS ABS: 0 10*3/uL (ref 0.0–0.1)
Basophils Relative: 0 % (ref 0–1)
Eosinophils Absolute: 0.1 10*3/uL (ref 0.0–0.7)
Eosinophils Relative: 1 % (ref 0–5)
HCT: 35.6 % — ABNORMAL LOW (ref 36.0–46.0)
Hemoglobin: 11.9 g/dL — ABNORMAL LOW (ref 12.0–15.0)
LYMPHS PCT: 23 % (ref 12–46)
Lymphs Abs: 2.2 10*3/uL (ref 0.7–4.0)
MCH: 32.6 pg (ref 26.0–34.0)
MCHC: 33.4 g/dL (ref 30.0–36.0)
MCV: 97.5 fL (ref 78.0–100.0)
Monocytes Absolute: 0.9 10*3/uL (ref 0.1–1.0)
Monocytes Relative: 9 % (ref 3–12)
NEUTROS ABS: 6.6 10*3/uL (ref 1.7–7.7)
NEUTROS PCT: 67 % (ref 43–77)
PLATELETS: 192 10*3/uL (ref 150–400)
RBC: 3.65 MIL/uL — ABNORMAL LOW (ref 3.87–5.11)
RDW: 16.6 % — AB (ref 11.5–15.5)
WBC: 9.8 10*3/uL (ref 4.0–10.5)

## 2014-02-06 MED ORDER — POTASSIUM CHLORIDE CRYS ER 20 MEQ PO TBCR
20.0000 meq | EXTENDED_RELEASE_TABLET | Freq: Two times a day (BID) | ORAL | Status: DC
  Administered 2014-02-06 – 2014-02-08 (×4): 20 meq via ORAL
  Filled 2014-02-06 (×6): qty 1

## 2014-02-06 MED ORDER — POTASSIUM CHLORIDE CRYS ER 20 MEQ PO TBCR
40.0000 meq | EXTENDED_RELEASE_TABLET | Freq: Once | ORAL | Status: AC
  Administered 2014-02-06: 40 meq via ORAL
  Filled 2014-02-06: qty 2

## 2014-02-06 MED ORDER — AMIODARONE HCL 100 MG PO TABS
100.0000 mg | ORAL_TABLET | Freq: Every day | ORAL | Status: DC
  Administered 2014-02-06 – 2014-02-07 (×2): 100 mg via ORAL
  Filled 2014-02-06 (×2): qty 1

## 2014-02-06 MED ORDER — METOLAZONE 2.5 MG PO TABS
2.5000 mg | ORAL_TABLET | Freq: Two times a day (BID) | ORAL | Status: DC
  Administered 2014-02-06 – 2014-02-07 (×3): 2.5 mg via ORAL
  Filled 2014-02-06 (×4): qty 1

## 2014-02-06 NOTE — Discharge Instructions (Addendum)
Information on my medicine - ELIQUIS® (apixaban) ° °This medication education was reviewed with me or my healthcare representative as part of my discharge preparation.  The pharmacist that spoke with me during my hospital stay was:  Farrie Sann P Justinian Miano, RPH ° °Why was Eliquis® prescribed for you? °Eliquis® was prescribed for you to reduce the risk of a blood clot forming that can cause a stroke if you have a medical condition called atrial fibrillation (a type of irregular heartbeat). ° °What do You need to know about Eliquis® ? °Take your Eliquis® TWICE DAILY - one tablet in the morning and one tablet in the evening with or without food. If you have difficulty swallowing the tablet whole please discuss with your pharmacist how to take the medication safely. ° °Take Eliquis® exactly as prescribed by your doctor and DO NOT stop taking Eliquis® without talking to the doctor who prescribed the medication.  Stopping may increase your risk of developing a stroke.  Refill your prescription before you run out. ° °After discharge, you should have regular check-up appointments with your healthcare provider that is prescribing your Eliquis®.  In the future your dose may need to be changed if your kidney function or weight changes by a significant amount or as you get older. ° °What do you do if you miss a dose? °If you miss a dose, take it as soon as you remember on the same day and resume taking twice daily.  Do not take more than one dose of ELIQUIS at the same time to make up a missed dose. ° °Important Safety Information °A possible side effect of Eliquis® is bleeding. You should call your healthcare provider right away if you experience any of the following: °  Bleeding from an injury or your nose that does not stop. °  Unusual colored urine (red or dark brown) or unusual colored stools (red or black). °  Unusual bruising for unknown reasons. °  A serious fall or if you hit your head (even if there is no bleeding). ° °Some  medicines may interact with Eliquis® and might increase your risk of bleeding or clotting while on Eliquis®. To help avoid this, consult your healthcare provider or pharmacist prior to using any new prescription or non-prescription medications, including herbals, vitamins, non-steroidal anti-inflammatory drugs (NSAIDs) and supplements. ° °This website has more information on Eliquis® (apixaban): www.Eliquis.com. ° °

## 2014-02-06 NOTE — Progress Notes (Signed)
PATIENT DETAILS Name: [name] Age: 80 y.o. Sex: female Date of Birth: February 09, 1924 Admit Date: 02/03/2014 Admitting Physician Doree Albee, MD WUJ:[id number],NFA Maisie Fus, MD  Subjective: No major issues overnight  Assessment/Plan: Active Problems: Acute Diastolic CHF -admitted and started on IV lasix, improved, but not sure whether she is back to her usual dry weight.  -Cards consulted, Lasix drip increased to 10 mg/hr, Metolazone added -strict intake and output, daily weights  Atrial Fibrillation -chronic -Cards recommending to decrease Amiodarone to 100 mg and to switch from Pradaxa to Eliquis  HCAP -afebrile and without leukocytosis -c/w Azactam,Vanco-day 3-suspect can be transitioned to Levaquin in the next few days -blood culture on 4/7 neg so far  Falls/Weakness -MRI brain neg for acute abnormalities -PT recommending HHPT on d/c -suspect secondary to CHF/Deconditioning/acute illness/advanced age  Aortic Stenosis/Mitral Regurgitation/Tricuspide Regurgitation -per cards-needs outpatient monitoring  Hyponatremia -resolved  Hypothyroidism -c/w Levothyroxine  DM -c/w SSI  OSA -c/w CPAP qhs  Disposition: Remain inpatient  DVT Prophylaxis: Not needed as on oral anticoagulation  Code Status: Full code   Family Communication None at bedside  Procedures:  None  CONSULTS:  cardiology  Time spent 40 minutes-which includes 50% of the time with face-to-face with patient/ family and coordinating care related to the above assessment and plan.    MEDICATIONS: Scheduled Meds: . allopurinol  300 mg Oral Daily  . amiodarone  100 mg Oral Daily  . atorvastatin  10 mg Oral q1800  . aztreonam  1 g Intravenous 3 times per day  . calcium-vitamin D  1 tablet Oral Q breakfast  . dabigatran  75 mg Oral Q12H  . gabapentin  300 mg Oral QHS  . insulin aspart  0-9 Units Subcutaneous TID WC  . levothyroxine  100 mcg Oral QAC breakfast  .  metolazone  2.5 mg Oral BID  . metoprolol succinate  12.5 mg Oral Daily  . multivitamin with minerals  1 tablet Oral Daily  . omega-3 acid ethyl esters  1 g Oral BID  . oxybutynin  10 mg Oral QHS  . potassium chloride  20 mEq Oral BID  . potassium chloride  40 mEq Oral Once  . sodium chloride  3 mL Intravenous Q12H  . vancomycin  750 mg Intravenous Q24H  . vitamin C  500 mg Oral Daily  . zaleplon  10 mg Oral QHS   Continuous Infusions: . furosemide (LASIX) infusion 10 mg/hr (02/06/14 1042)   PRN Meds:.sodium chloride, acetaminophen, sodium chloride  Antibiotics: Anti-infectives   Start     Dose/Rate Route Frequency Ordered Stop   02/05/14 0600  vancomycin (VANCOCIN) IVPB 750 mg/150 ml premix     750 mg 150 mL/hr over 60 Minutes Intravenous Every 24 hours 02/03/14 2350     02/04/14 2300  ceFEPIme (MAXIPIME) 1 g in dextrose 5 % 50 mL IVPB  Status:  Discontinued     1 g 100 mL/hr over 30 Minutes Intravenous Every 24 hours 02/03/14 2247 02/03/14 2310   02/04/14 2200  aztreonam (AZACTAM) 1 g in dextrose 5 % 50 mL IVPB     1 g 100 mL/hr over 30 Minutes Intravenous 3 times per day 02/04/14 1451 02/11/14 2159   02/03/14 2315  aztreonam (AZACTAM) 2 g in dextrose 5 % 50 mL IVPB  Status:  Discontinued     2 g 100 mL/hr over 30 Minutes Intravenous 3 times per day 02/03/14 2310 02/04/14 1451   02/03/14 2230  vancomycin (  VANCOCIN) IVPB 1000 mg/200 mL premix     1,000 mg 200 mL/hr over 60 Minutes Intravenous  Once 02/03/14 2216 02/04/14 0111   02/03/14 2230  ceFEPIme (MAXIPIME) 2 g in dextrose 5 % 50 mL IVPB  Status:  Discontinued     2 g 100 mL/hr over 30 Minutes Intravenous  Once 02/03/14 2220 02/03/14 2310       PHYSICAL EXAM: Vital signs in last 24 hours: Filed Vitals:   02/06/14 0344 02/06/14 0900 02/06/14 1040 02/06/14 1041  BP: 106/79  107/48   Pulse: 73  69   Temp: 97.9 F (36.6 C)     TempSrc: Oral     Resp: 16     Height:      Weight:  70.308 kg (155 lb)    SpO2: 94%   88% 93%    Weight change:  Filed Weights   02/03/14 2300 02/04/14 0250 02/06/14 0900  Weight: 71 kg (156 lb 8.4 oz) 76.068 kg (167 lb 11.2 oz) 70.308 kg (155 lb)   Body mass index is 24.27 kg/(m^2).   Gen Exam: Awake and alert with clear speech.   Neck: Supple, No JVD.   Chest: Bibasilar rales CVS: S1 S2 Regular, no murmurs.  Abdomen: soft, BS +, non tender, non distended.  Extremities: no edema, lower extremities warm to touch. Neurologic: Non Focal.   Skin: No Rash.   Wounds: N/A.   Intake/Output from previous day:  Intake/Output Summary (Last 24 hours) at 02/06/14 1124 Last data filed at 02/06/14 0950  Gross per 24 hour  Intake    480 ml  Output    900 ml  Net   -420 ml     LAB RESULTS: CBC  Recent Labs Lab 02/03/14 2158 02/04/14 0008 02/05/14 0645 02/06/14 0526  WBC 11.0* 12.3* 9.8 9.8  HGB 11.9* 11.8* 11.1* 11.9*  HCT 35.7* 34.9* 33.4* 35.6*  PLT 204 192 193 192  MCV 97.3 96.9 97.9 97.5  MCH 32.4 32.8 32.6 32.6  MCHC 33.3 33.8 33.2 33.4  RDW 16.3* 16.3* 16.5* 16.6*  LYMPHSABS  --  1.7 1.9 2.2  MONOABS  --  1.0 0.8 0.9  EOSABS  --  0.0 0.1 0.1  BASOSABS  --  0.0 0.0 0.0    Chemistries   Recent Labs Lab 02/03/14 2158 02/04/14 0008 02/05/14 0645 02/05/14 1450 02/06/14 0526  NA 133* 133* 137 136* 138  K 5.0 4.1 4.1 3.8 3.7  CL 91* 93* 94* 92* 94*  CO2 26 26 30 28 30   GLUCOSE 161* 119* 104* 136* 84  BUN 31* 29* 23 23 24*  CREATININE 1.18* 1.10 1.01 1.04 1.09  CALCIUM 9.2 9.0 8.3* 8.5 8.7    CBG:  Recent Labs Lab 02/05/14 0802 02/05/14 1208 02/05/14 1646 02/05/14 2028 02/06/14 0740  GLUCAP 102* 120* 113* 132* 124*    GFR Estimated Creatinine Clearance: 35.4 ml/min (by C-G formula based on Cr of 1.09).  Coagulation profile No results found for this basename: INR, PROTIME,  in the last 168 hours  Cardiac Enzymes No results found for this basename: CK, CKMB, TROPONINI, MYOGLOBIN,  in the last 168 hours  No components found with  this basename: POCBNP,  No results found for this basename: DDIMER,  in the last 72 hours  Recent Labs  02/03/14 2358  HGBA1C 6.2*   No results found for this basename: CHOL, HDL, LDLCALC, TRIG, CHOLHDL, LDLDIRECT,  in the last 72 hours No results found for this basename: TSH, T4TOTAL,  FREET3, T3FREE, THYROIDAB,  in the last 72 hours No results found for this basename: VITAMINB12, FOLATE, FERRITIN, TIBC, IRON, RETICCTPCT,  in the last 72 hours No results found for this basename: LIPASE, AMYLASE,  in the last 72 hours  Urine Studies No results found for this basename: UACOL, UAPR, USPG, UPH, UTP, UGL, UKET, UBIL, UHGB, UNIT, UROB, ULEU, UEPI, UWBC, URBC, UBAC, CAST, CRYS, UCOM, BILUA,  in the last 72 hours  MICROBIOLOGY: Recent Results (from the past 240 hour(s))  CULTURE, BLOOD (ROUTINE X 2)     Status: None   Collection Time    02/03/14 10:16 PM      Result Value Ref Range Status   Specimen Description BLOOD RIGHT HAND   Final   Special Requests BOTTLES DRAWN AEROBIC ONLY 5CC   Final   Culture  Setup Time     Final   Value: 02/04/2014 08:19     Performed at [facility]   Culture     Final   Value:        BLOOD CULTURE RECEIVED NO GROWTH TO DATE CULTURE WILL BE HELD FOR 5 DAYS BEFORE ISSUING A FINAL NEGATIVE REPORT     Performed at [facility]   Report Status PENDING   Incomplete  CULTURE, BLOOD (ROUTINE X 2)     Status: None   Collection Time    02/03/14 11:59 PM      Result Value Ref Range Status   Specimen Description BLOOD LEFT HAND   Final   Special Requests     Final   Value: BOTTLES DRAWN AEROBIC AND ANAEROBIC 10CC BLUE,3CC RED   Culture  Setup Time     Final   Value: 02/04/2014 08:19     Performed at [facility]   Culture     Final   Value:        BLOOD CULTURE RECEIVED NO GROWTH TO DATE CULTURE WILL BE HELD FOR 5 DAYS BEFORE ISSUING A FINAL NEGATIVE REPORT     Performed at [facility]   Report Status PENDING   Incomplete     RADIOLOGY STUDIES/RESULTS: Dg Chest 2 View  02/03/2014   CLINICAL DATA:  Multiple falls, with generalized weakness.  EXAM: CHEST  2 VIEW  COMPARISON:  Chest radiograph performed 01/23/2014  FINDINGS: The lungs are well-aerated. Vascular congestion is noted, with mildly increased interstitial markings. Left basilar airspace opacification is noted, with a persistent small left pleural effusion, slightly improved from the prior study. No pneumothorax is seen.  The heart is mildly enlarged. Calcification is noted within the aortic arch. No acute osseous abnormalities are seen.  IMPRESSION: Vascular congestion and mild cardiomegaly, with mildly increased interstitial markings. Left basilar airspace opacity noted, with a residual small left pleural effusion. This may reflect mild persistent pulmonary edema or possibly pneumonia. No displaced rib fracture seen.   Electronically Signed   By: Roanna Raider M.D.   On: 02/03/2014 22:11   Dg Chest 2 View  01/23/2014   CLINICAL DATA:  Chronic congestive heart failure now with shortness of breath.  EXAM: CHEST  2 VIEW  COMPARISON:  DG CHEST 1V PORT dated 01/11/2014  FINDINGS: The lungs are mildly hypoinflated. The pulmonary interstitial markings are increased. The pulmonary vascularity is engorged and indistinct. The cardiopericardial silhouette is enlarged. The left hemidiaphragm is largely obscured. The observed portions of the bony thorax exhibit no acute abnormalities.  IMPRESSION: The findings are consistent with congestive heart failure with pulmonary interstitial  edema and a moderate size left pleural effusion. There has been deterioration in the appearance of the chest since the previous study.   Electronically Signed   By: David  [place]   On: 01/23/2014 10:07   Mr Brain Wo Contrast  02/04/2014   CLINICAL DATA:  Bilateral weakness, worse on the left. History of CVA.  EXAM: MRI HEAD WITHOUT CONTRAST  TECHNIQUE: Multiplanar, multiecho pulse sequences of the  brain and surrounding structures were obtained without intravenous contrast.  COMPARISON:  MRI brain 09/05/2010  FINDINGS: Moderate generalized atrophy and white matter disease has progressed since prior exam. No acute infarct, hemorrhage, or mass lesion is present. And the ventricles are proportionate to the degree of atrophy. No significant extraaxial fluid collection is present.  Flow is present in the major intracranial arteries. Remote lacunar infarcts of the basal ganglia and cerebellum are stable.  Patient is status post bilateral lens replacements. Mild mucosal thickening is present in the right sphenoid sinus. The remaining paranasal sinuses are clear. There is minimal fluid in the mastoid air cells. No obstructing nasopharyngeal lesion is evident.  IMPRESSION: 1. No acute intracranial abnormality. 2. Progressive atrophy and white matter disease likely reflects the sequela of chronic microvascular ischemia. 3. Minimal right sphenoid sinus disease. 4. Small mastoid effusions. No obstructing nasopharyngeal lesion is evident.   Electronically Signed   By: Gennette Pac M.D.   On: 02/04/2014 08:51   Dg Chest Port 1 View  01/11/2014   CLINICAL DATA:  Shortness of Breath  EXAM: PORTABLE CHEST - 1 VIEW  COMPARISON:  January 05, 2014  FINDINGS: There is no edema or consolidation. Heart is enlarged with mild pulmonary venous hypertension. No adenopathy. No bone lesions.  IMPRESSION: Evidence of volume overload with cardiomegaly and pulmonary venous hypertension. No overt edema or consolidation, however. No appreciable effusions.   Electronically Signed   By: Bretta Bang M.D.   On: 01/11/2014 21:58    Shanker Levora Dredge, MD  [facility] Pager:[phone]  If 7PM-7AM, please contact night-coverage [email or web address] Password [email or web address] 02/06/2014, 11:24 AM   LOS: 3 days

## 2014-02-06 NOTE — Progress Notes (Signed)
ANTIBIOTIC CONSULT NOTE   Pharmacy Consult for Vancomycin and aztreonam Indication: rule out pneumonia  Allergies  Allergen Reactions  . Aspirin Other (See Comments)    Tears my stomach up; "can take coated Aspirin qd without problems"  . Ambien [Zolpidem Tartrate]     Pt does not wakeup from [place]  . Nexium [Esomeprazole Magnesium] Rash  . Penicillins Rash    Tolerates Rocephin    Patient Measurements: Height: 5\' 7"  (170.2 cm) Weight: 155 lb (70.308 kg) IBW/kg (Calculated) : 61.6  Vital Signs: Temp: 97.9 F (36.6 C) (04/10 0344) Temp src: Oral (04/10 0344) BP: 106/79 mmHg (04/10 0344) Pulse Rate: 73 (04/10 0344) Intake/Output from previous day: 04/09 0701 - 04/10 0700 In: 600 [P.O.:600] Out: 700 [Urine:700] Intake/Output from this shift: Total I/O In: -  Out: 200 [Urine:200]  Labs:  Recent Labs  02/04/14 0008 02/05/14 0645 02/05/14 1450 02/06/14 0526  WBC 12.3* 9.8  --  9.8  HGB 11.8* 11.1*  --  11.9*  PLT 192 193  --  192  CREATININE 1.10 1.01 1.04 1.09   Estimated Creatinine Clearance: 35.4 ml/min (by C-G formula based on Cr of 1.09). No results found for this basename: VANCOTROUGH, VANCOPEAK, VANCORANDOM, GENTTROUGH, GENTPEAK, GENTRANDOM, TOBRATROUGH, TOBRAPEAK, TOBRARND, AMIKACINPEAK, AMIKACINTROU, AMIKACIN,  in the last 72 hours   Microbiology: Recent Results (from the past 720 hour(s))  URINE CULTURE     Status: None   Collection Time    01/12/14  1:03 AM      Result Value Ref Range Status   Specimen Description URINE, CLEAN CATCH   Final   Special Requests NONE   Final   Culture  Setup Time     Final   Value: 01/12/2014 01:19     Performed at [facility] Count     Final   Value: 70,000 COLONIES/ML     Performed at [facility]   Culture     Final   Value: ENTEROCOCCUS SPECIES     Performed at [facility]   Report Status 01/13/2014 FINAL   Final   Organism ID, Bacteria ENTEROCOCCUS SPECIES   Final  URINE  CULTURE     Status: None   Collection Time    01/23/14 11:00 AM      Result Value Ref Range Status   Specimen Description URINE, CLEAN CATCH   Final   Special Requests NONE   Final   Culture  Setup Time     Final   Value: 01/23/2014 11:53     Performed at [facility] Count     Final   Value: >=100,000 COLONIES/ML     Performed at [facility]   Culture     Final   Value: ENTEROCOCCUS SPECIES     Performed at [facility]   Report Status 01/25/2014 FINAL   Final   Organism ID, Bacteria ENTEROCOCCUS SPECIES   Final  CULTURE, BLOOD (ROUTINE X 2)     Status: None   Collection Time    02/03/14 10:16 PM      Result Value Ref Range Status   Specimen Description BLOOD RIGHT HAND   Final   Special Requests BOTTLES DRAWN AEROBIC ONLY 5CC   Final   Culture  Setup Time     Final   Value: 02/04/2014 08:19     Performed at [facility]   Culture     Final   Value:  BLOOD CULTURE RECEIVED NO GROWTH TO DATE CULTURE WILL BE HELD FOR 5 DAYS BEFORE ISSUING A FINAL NEGATIVE REPORT     [facility]   Report Status PENDING   Incomplete  CULTURE, BLOOD (ROUTINE X 2)     Status: None   Collection Time    02/03/14 11:59 PM      Result Value Ref Range Status   Specimen Description BLOOD LEFT HAND   Final   Special Requests     Final   Value: BOTTLES DRAWN AEROBIC AND ANAEROBIC 10CC BLUE,3CC RED   Culture  Setup Time     Final   Value: 02/04/2014 08:19     [facility]formed at [facility]   Culture     Final   Value:        BLOOD CULTURE RECEIVED NO GROWTH TO DATE CULTURE WILL BE HELD FOR 5 DAYS BEFORE ISSUING A FINAL NEGATIVE REPORT     [facility]formed at [facility]   Report Status PENDING   Incomplete  Assessment: 80 yo female with HCAP for empiric antibiotics. Patient is currently afebrile with normal wbc count. Renal function stable with scr of 1.0. Increasing lasix drip so will watch closely.   4/7 bld - ngtd 4/7  urine - many bacteria - no cx done  Patient continues on low dose pradaxa for afib. Considering switching to apixaban, will check copay info with case management first.  Goal of Therapy:  Vancomycin trough level 15-20 mcg/ml  Plan:  Continue vancomycin 750 q24 hours - consider trough later this weekend. Continue aztreonam 1g q8 hours  Sheppard CoilFrank Tahir Blank PharmD., BCPS Clinical Pharmacist Pager [phone] 02/06/2014 10:34 AM

## 2014-02-06 NOTE — Consult Note (Signed)
Advanced Heart Failure Team Consult Note  Referring Physician: [facility] Primary Physician: Dr Pete Glatter  Primary Cardiologist:  Dr Mayford Knife  Reason for Consultation: Diastolic Heart Failure  HPI:    The Heart Failure team was asked to provide a consult by [facility].   [name] is an 80 year old with a history of diastolic CHF EF 55-60% 12/2013, HTN, HLD, DM, MR and AS, chronic A-fib. OSA on CPAP.  She was last admitted 3/15 with volume overload. She was placed on lasix drip and later transitioned to lasix 40 mg twice a day. Discharge weight at that time was 161 pounds.  She was discharged to CIR.   While at CIR she developed hyponatremia and nephrology was consulted with recommendations to cut lasix back to 20 mg twice a day and restrict fluids. She was discharged to home from [facility] 01/30/14 with weight 160 pounds. Daughter prepares medications.   She presented to [facility]  ED after a fall with dyspnea and weakness.  Placed on lasix drip. Pertinent admission labs include: Pro BNP 2950, K 5.0, Na 133, CE negative, creatinine 1.18, and lactic acid 2.21. Weights are not accurate. I/O not accurate due to urinary incontinence.   Denies SOB. Complains of fatigue.   FH: Mother, Father, Brother had heart disease SH: Widowed. Lives at home alone but has 24 hour care givers. Retired from [employer] She does smoker or drink alcohol.   Review of Systems: [y] = yes, [ ]  = no   General: Weight gain [ ] ; Weight loss [ ] ; Anorexia [ ] ; Fatigue [Y ]; Fever [ ] ; Chills [ ] ; Weakness [Y ]  Cardiac: Chest pain/pressure [ ] ; Resting SOB [ ] ; Exertional SOB [Y ]; Orthopnea [ ] ; Pedal Edema [ ] ; Palpitations [ ] ; Syncope [ ] ; Presyncope [ ] ; Paroxysmal nocturnal dyspnea[ ]   Pulmonary: Cough [ ] ; Wheezing[ ] ; Hemoptysis[ ] ; Sputum [ ] ; Snoring [ ]   GI: Vomiting[ ] ; Dysphagia[ ] ; Melena[ ] ; Hematochezia [ ] ; Heartburn[ ] ; Abdominal pain [ ] ; Constipation [ ] ; Diarrhea [ ] ; BRBPR [ ]   GU: Hematuria[ ] ; Dysuria  [ ] ; Nocturia[ ]   Vascular: Pain in legs with walking [ ] ; Pain in feet with lying flat [ ] ; Non-healing sores [ ] ; Stroke [ ] ; TIA [ ] ; Slurred speech [ ] ;  Neuro: Headaches[ ] ; Vertigo[ ] ; Seizures[ ] ; Paresthesias[ ] ;Blurred vision [ ] ; Diplopia [ ] ; Vision changes [ ]   Ortho/Skin: Arthritis [ ] ; Joint pain [Y ]; Muscle pain [ ] ; Joint swelling [ ] ; Back Pain [ ] ; Rash [ ]   Psych: Depression[ ] ; Anxiety[ ]   Heme: Bleeding problems [ ] ; Clotting disorders [ ] ; Anemia [ ]   Endocrine: Diabetes [ ] ; Thyroid dysfunction[Y ]  Home Medications Prior to Admission medications   Medication Sig Start Date End Date Taking? Authorizing Provider  acetaminophen (TYLENOL) 500 MG tablet Take 500 mg by mouth every 6 (six) hours as needed for pain.   Yes Historical Provider, MD  allopurinol (ZYLOPRIM) 300 MG tablet Take 1 tablet (300 mg total) by mouth daily. 01/30/14  Yes Atif Chapple J Angiulli, PA-C  amiodarone (PACERONE) 200 MG tablet Take 1 tablet (200 mg total) by mouth daily. 01/30/14 08/23/15 Yes Kalkidan Caudell J Angiulli, PA-C  atorvastatin (LIPITOR) 10 MG tablet Take 1 tablet (10 mg total) by mouth daily. 01/30/14  Yes Sarahann Horrell J Angiulli, PA-C  Calcium Carbonate-Vitamin D (CALCIUM-VITAMIN D) 600-200 MG-UNIT CAPS Take 1 tablet by mouth daily.    Yes Historical Provider, MD  dabigatran (PRADAXA) 75 MG CAPS capsule Take 1 capsule (75 mg total) by mouth every 12 (twelve) hours. 01/30/14  Yes Nessa Ramaker J Angiulli, PA-C  furosemide (LASIX) 20 MG tablet Take 1 tablet (20 mg total) by mouth 2 (two) times daily. 01/30/14  Yes Zalyn Amend J Angiulli, PA-C  gabapentin (NEURONTIN) 300 MG capsule Take 1 capsule (300 mg total) by mouth at bedtime. 01/30/14  Yes Caitlin Ainley J Angiulli, PA-C  levothyroxine (SYNTHROID, LEVOTHROID) 100 MCG tablet Take 100 mcg by mouth daily before breakfast.   Yes Historical Provider, MD  metoprolol succinate (TOPROL-XL) 25 MG 24 hr tablet Take 0.5 tablets (12.5 mg total) by mouth daily. 01/30/14  Yes Brentley Horrell J Angiulli, PA-C   Multiple Vitamins-Minerals (MULTIVITAMIN WITH MINERALS) tablet Take 1 tablet by mouth daily.   Yes Historical Provider, MD  Omega-3 Fatty Acids (FISH OIL) 1000 MG CAPS Take 1,000 mg by mouth daily.    Yes Historical Provider, MD  oxybutynin (DITROPAN) 5 MG tablet Take 2 tablets (10 mg total) by mouth at bedtime. 01/30/14  Yes Lanesha Azzaro J Angiulli, PA-C  polycarbophil (FIBERCON) 625 MG tablet Take 625 mg by mouth 2 (two) times daily.   Yes Historical Provider, MD  potassium chloride (K-DUR,KLOR-CON) 10 MEQ tablet Take 1 tablet (10 mEq total) by mouth 2 (two) times daily. 01/30/14  Yes Michelyn Scullin J Angiulli, PA-C  vitamin C (ASCORBIC ACID) 500 MG tablet Take 500 mg by mouth daily.   Yes Historical Provider, MD  zaleplon (SONATA) 5 MG capsule Take 2 capsules (10 mg total) by mouth at bedtime. 01/30/14  Yes Charlton Amoraniel J Angiulli, PA-C    Past Medical History: Past Medical History  Diagnosis Date  . Hypertension   . Peripheral neuropathy   . Asthma   . High cholesterol   . Diabetes with neurologic complications   . PAD (peripheral artery disease)   . Enlarged heart   . Anginal pain 05/28/12  . Pneumonia 11/2010  . Sleep apnea     cpap  . Hypothyroidism   . Headache(784.0) 05/29/12    "recently"  . Stroke 05/2005; 08/2010    !mini"  . Personal history of gout   . Hypothyroidism   . DJD (degenerative joint disease)   . Carpal tunnel syndrome, bilateral   . Recurrent labyrinthitis   . GERD (gastroesophageal reflux disease)     S/P esophageal dilation in 1996  . Gallstone     Silent  . Fibrocystic breast changes   . Hemorrhoids     with bleeding  . Hx of adenomatous colonic polyps     Dr Sherin QuarryWeissman  . Diverticulitis     left side  . MR (mitral regurgitation)     moderate  . Mild anemia     hemoglobin 11.8 on 06/2009  . Edema of lower extremity     Chronic  . Ischemic colitis 8/13  . Aortic stenosis, moderate   . Chronic diastolic CHF (congestive heart failure)   . Moderate mitral regurgitation    . CKD (chronic kidney disease) stage 3, GFR 30-59 ml/min     "something on labs always say she has some problems"  . Chronic atrial fibrillation     Past Surgical History: Past Surgical History  Procedure Laterality Date  . Tonsillectomy and adenoidectomy  1960's  . Dilation and curettage of uterus    . Vaginal hysterectomy  1970's  . Total knee arthroplasty  1970-~2002    left; right  . Cataract extraction w/ intraocular lens  implant, bilateral  ?  1970's    Family History: Family History  Problem Relation Age of Onset  . Heart disease Mother   . Heart disease Father   . Heart disease Brother     Social History: History   Social History  . Marital Status: Widowed    Spouse Name: N/A    Number of Children: 3  . Years of Education: N/A   Occupational History  . RETIRED     [employer]   Social History Main Topics  . Smoking status: Never Smoker   . Smokeless tobacco: Never Used  . Alcohol Use: No  . Drug Use: No  . Sexual Activity: No   Other Topics Concern  . None   Social History Narrative   Retired. Lives alone. 3 adult children. No tobacco use. Nondrinker. No illicit drug use.     Allergies:  Allergies  Allergen Reactions  . Aspirin Other (See Comments)    Tears my stomach up; "can take coated Aspirin qd without problems"  . Ambien [Zolpidem Tartrate]     Pt does not wakeup from [place]  . Nexium [Esomeprazole Magnesium] Rash  . Penicillins Rash    Tolerates Rocephin    Objective:    Vital Signs:   Temp:  [97.8 F (36.6 C)-98 F (36.7 C)] 97.9 F (36.6 C) (04/10 0344) Pulse Rate:  [73-89] 73 (04/10 0344) Resp:  [16-17] 16 (04/10 0344) BP: (105-116)/(65-80) 106/79 mmHg (04/10 0344) SpO2:  [92 %-94 %] 94 % (04/10 0344) Last BM Date: 02/04/14  Weight change: Filed Weights   02/03/14 2300 02/04/14 0250  Weight: 156 lb 8.4 oz (71 kg) 167 lb 11.2 oz (76.068 kg)    Intake/Output:   Intake/Output Summary (Last 24 hours) at 02/06/14 0945 Last  data filed at 02/05/14 2148  Gross per 24 hour  Intake    480 ml  Output    700 ml  Net   -220 ml     Physical Exam: General:  Chronically ill appearing. No resp difficulty HEENT: normal Neck: supple. JVP to jaw prominent CV waves. Carotids 2+ bilat; no bruits. No lymphadenopathy or thryomegaly appreciated. Cor: PMI nondisplaced. Irregular rate & rhythm. No rubs, gallops. TR. Lungs: clear Abdomen: soft, nontender, ++ distended. No hepatosplenomegaly. No bruits or masses. Good bowel sounds. Extremities: no cyanosis, clubbing, rash, edema Neuro: alert & orientedx3, cranial nerves grossly intact. moves all 4 extremities w/o difficulty. Affect pleasant  Telemetry:  A fib   Labs: Basic Metabolic Panel:  Recent Labs Lab 01/31/14 0535 02/03/14 2158 02/04/14 0008 02/05/14 0645 02/05/14 1450 02/06/14 0526  NA 137 133* 133* 137 136* 138  K 3.8 5.0 4.1 4.1 3.8 3.7  CL 94* 91* 93* 94* 92* 94*  CO2 31 26 26 30 28 30   GLUCOSE 85 161* 119* 104* 136* 84  BUN 21 31* 29* 23 23 24*  CREATININE 1.11* 1.18* 1.10 1.01 1.04 1.09  CALCIUM 8.4 9.2 9.0 8.3* 8.5 8.7  PHOS 3.5  --   --   --   --   --     Liver Function Tests:  Recent Labs Lab 01/31/14 0535 02/04/14 0008 02/05/14 0645 02/06/14 0526  AST  --  62* 81* 61*  ALT  --  37* 36* 35  ALKPHOS  --  108 99 104  BILITOT  --  0.9 0.7 0.8  PROT  --  6.4 5.9* 6.3  ALBUMIN 2.3* 2.8* 2.3* 2.4*   No results found for this basename: LIPASE, AMYLASE,  in the last  168 hours No results found for this basename: AMMONIA,  in the last 168 hours  CBC:  Recent Labs Lab 02/03/14 2158 02/04/14 0008 02/05/14 0645 02/06/14 0526  WBC 11.0* 12.3* 9.8 9.8  NEUTROABS  --  9.5* 7.0 6.6  HGB 11.9* 11.8* 11.1* 11.9*  HCT 35.7* 34.9* 33.4* 35.6*  MCV 97.3 96.9 97.9 97.5  PLT 204 192 193 192    Cardiac Enzymes: No results found for this basename: CKTOTAL, CKMB, CKMBINDEX, TROPONINI,  in the last 168 hours  BNP: BNP (last 3  results)  Recent Labs  01/11/14 2206 01/12/14 0455 02/03/14 2158  PROBNP 2822.0* 3584.0* 2950.0*    CBG:  Recent Labs Lab 02/05/14 0802 02/05/14 1208 02/05/14 1646 02/05/14 2028 02/06/14 0740  GLUCAP 102* 120* 113* 132* 124*    Coagulation Studies: No results found for this basename: LABPROT, INR,  in the last 72 hours  Other results: EKG: A fib RBBB 86 bpm    Imaging:  No results found.   Medications:     Current Medications: . allopurinol  300 mg Oral Daily  . amiodarone  200 mg Oral Daily  . atorvastatin  10 mg Oral q1800  . aztreonam  1 g Intravenous 3 times per day  . calcium-vitamin D  1 tablet Oral Q breakfast  . dabigatran  75 mg Oral Q12H  . gabapentin  300 mg Oral QHS  . insulin aspart  0-9 Units Subcutaneous TID WC  . levothyroxine  100 mcg Oral QAC breakfast  . metoprolol succinate  12.5 mg Oral Daily  . multivitamin with minerals  1 tablet Oral Daily  . omega-3 acid ethyl esters  1 g Oral BID  . oxybutynin  10 mg Oral QHS  . potassium chloride  10 mEq Oral BID  . sodium chloride  3 mL Intravenous Q12H  . vancomycin  750 mg Intravenous Q24H  . vitamin C  500 mg Oral Daily  . zaleplon  10 mg Oral QHS     Infusions: . furosemide (LASIX) infusion 4 mg/hr (02/04/14 1256)      Assessment:  1. A/C diastolic heart failure EF 55-60%  2. Chronic A fib- on chronic amiodarone 3. TR 4. Fall 5. Hypothyroid- TSH 1.5 01/12/14  6. UTI 7. DM     Plan/Discussion:    [name] is an 80 year old admitted after fall and increased dyspnea. Volume overload noted on exam. Increase lasix drip to 10 mg per hour and add metolazone 2.5 mg twice a day. Supplement potassium.   Chronic A fib with controlled rate on amiodarone 200 mg daily. Currently on pradaxa. Will try to switch to eliquis. Check CM to see if can afford.    Length of Stay: 3  Judith D Clegg NP-C 02/06/2014, 9:45 AM  Advanced Heart Failure Team Pager [phone] (M-F; 7a - 4p)  Please  contact [facility] for night-coverage after hours (4p -7a ) and weekends on [email or web address]  Patient seen and examined with Judith Becket, NP. We discussed all aspects of the encounter. I agree with the assessment and plan as stated above.   [name] has diastolic/valvular HF with significant volume overload. She is not responding to low-dose lasix (4/hr). Will increase drip to 10/hr and add metolazone. Place Foley for now until diuresed. Would also consider switching Pradaxa to Eliquis 2.5 bid as there is no clinical data to support this dose of Pradaxa. Will consult CM to see if we can get her Eliquis. On /c will need  to increase lasix or switch to torsemide. Will also need f/u in HF Clinic.   AF is chronic and rate controlled. Need to see if we can wean amio off. Will decrease to 100 daily.   Bevelyn Buckles Judith Silman,MD 10:55 AM

## 2014-02-06 NOTE — Progress Notes (Signed)
Physical Therapy Treatment Patient Details Name: [name] MRN: [id number] DOB: [birth year] Today's Date: 02/06/2014    History of Present Illness This is a 80 y.o. year old female with prior hx/o HTN, DM, afib, diastolic CHF, moderate AS, MR, stage 3 CKD presenting with weakness, dyspnea, found to have LLL PNA, mild CHF exacerbation. Pt noted to have been recently discharged from inpt rehab s/p hospitalization for acute resp failure 2/2 CHF exacerbation and UTI. Please see discharge summaries for full details. Per the family, pt was discharged from inpt rehab last week. Pt developed persisent weakness since this am. Pt states that legs gave away on her twice today. Did fall, though no LOC. Denies any head trauma. Has had some persistent dyspnea over course of the day as well as progressive LE swelling. Has been compliant with home lasix. Denies heavy salt or NSAID use. Denies any CP, nausea, diaphoresis, fever, chills. Has had some predominant L sided/LLE weakness over time frame. Denies slurred speech, confusion. No dysuria. No diarrhea.  Pt dx with acute diastolic CHF and HCAP.      PT Comments    Pt is progressing slowly with mobility and needs to be pushed to go further during gait to increase her strength and her endurance.  Pt's son is encouraging and helpful throughout session.  HEP given for leg strengthening.  DOE 2/4 during gait and used 2 L O2 [place] because I was unable to get an accurate pulse ox reading due to cold fingers.  PT to continue to follow acutely.  [facility]pt [facility].    Follow Up Recommendations  Home health PT;Supervision/Assistance - 24 hour     Equipment Recommendations  None recommended by PT    Recommendations for Other Services   NA     Precautions / Restrictions Precautions Precautions: Fall Precaution Comments: Fear of falling; h/o falls    Mobility  Bed Mobility Overal bed mobility: Modified Independent Bed  Mobility: Supine to Sit     Supine to sit: Modified independent (Device/Increase time)     General bed mobility comments: pt using bed rail to get to EOB and HOB ~30 degrees.  Pt, given extra time can get there without PT 's assist, but she is also using the bed to assit.   Transfers Overall transfer level: Needs assistance Equipment used: Rolling walker (2 wheeled) Transfers: Sit to/from Stand Sit to Stand: Min assist         General transfer comment: Min assist to support trunk to get to standing.  Verbal cues for safe hand placement (although pt starting to report them on her own).    Ambulation/Gait Ambulation/Gait assistance: Min assist Ambulation Distance (Feet): 75 Feet (50'x1, 3 min seated rest, 25'x1) Assistive device: Rolling walker (2 wheeled) Gait Pattern/deviations: Step-through pattern;Shuffle (left leg weaker and buckling more than right) Gait velocity: decreased Gait velocity interpretation: Below normal speed for age/gender General Gait Details: Pt with short, choppy steps during gait.  Seems to have more giving way with fatigue on the left.  Support needed at her trunk and chair to follow to encourage increased gait distance and safety.           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  Encourage OOB this weekend as well as doing HEP TID.          Pertinent Vitals/Pain DOE 2/4 with gait.  HR ranged from 83-86 bpm during mobility. Unable to get pulse ox reading due to cold fingers, so 2 L O2 [place] used throughout.             PT Goals (current goals can now be found in the care plan section) Acute Rehab PT Goals Patient Stated Goal: return home Progress towards PT goals: Progressing toward goals    Frequency  Min 3X/week    PT Plan Current plan remains appropriate       End of Session   Activity Tolerance: Patient limited by fatigue Patient left: in chair;with call bell/phone within reach;with family/visitor present     Time: 1610-9604 PT Time Calculation (min): 34 min  Charges:  $Gait Training: 8-22 mins $Therapeutic Exercise: 8-22 mins                      Nishtha Raider B. Jovi Zavadil, PT, DPT [phone]   02/06/2014, 4:01 PM

## 2014-02-07 DIAGNOSIS — E039 Hypothyroidism, unspecified: Secondary | ICD-10-CM

## 2014-02-07 LAB — COMPREHENSIVE METABOLIC PANEL
ALK PHOS: 114 U/L (ref 39–117)
ALT: 42 U/L — AB (ref 0–35)
AST: 92 U/L — ABNORMAL HIGH (ref 0–37)
Albumin: 2.6 g/dL — ABNORMAL LOW (ref 3.5–5.2)
BUN: 28 mg/dL — ABNORMAL HIGH (ref 6–23)
CO2: 36 meq/L — AB (ref 19–32)
Calcium: 9.3 mg/dL (ref 8.4–10.5)
Chloride: 90 mEq/L — ABNORMAL LOW (ref 96–112)
Creatinine, Ser: 1.1 mg/dL (ref 0.50–1.10)
GFR calc non Af Amer: 44 mL/min — ABNORMAL LOW (ref >90)
GFR, EST AFRICAN AMERICAN: 51 mL/min — AB (ref >90)
Glucose, Bld: 94 mg/dL (ref 70–99)
POTASSIUM: 4 meq/L (ref 3.7–5.3)
SODIUM: 138 meq/L (ref 137–147)
TOTAL PROTEIN: 6.7 g/dL (ref 6.0–8.3)
Total Bilirubin: 0.8 mg/dL (ref 0.3–1.2)

## 2014-02-07 LAB — CBC WITH DIFFERENTIAL/PLATELET
BASOS ABS: 0 10*3/uL (ref 0.0–0.1)
BASOS PCT: 0 % (ref 0–1)
EOS ABS: 0.1 10*3/uL (ref 0.0–0.7)
EOS PCT: 1 % (ref 0–5)
HEMATOCRIT: 37.9 % (ref 36.0–46.0)
Hemoglobin: 12.6 g/dL (ref 12.0–15.0)
Lymphocytes Relative: 23 % (ref 12–46)
Lymphs Abs: 2.5 10*3/uL (ref 0.7–4.0)
MCH: 32.6 pg (ref 26.0–34.0)
MCHC: 33.2 g/dL (ref 30.0–36.0)
MCV: 98.2 fL (ref 78.0–100.0)
MONO ABS: 1 10*3/uL (ref 0.1–1.0)
Monocytes Relative: 9 % (ref 3–12)
Neutro Abs: 7.2 10*3/uL (ref 1.7–7.7)
Neutrophils Relative %: 67 % (ref 43–77)
Platelets: 214 10*3/uL (ref 150–400)
RBC: 3.86 MIL/uL — ABNORMAL LOW (ref 3.87–5.11)
RDW: 16.7 % — AB (ref 11.5–15.5)
WBC: 10.8 10*3/uL — ABNORMAL HIGH (ref 4.0–10.5)

## 2014-02-07 LAB — GLUCOSE, CAPILLARY
GLUCOSE-CAPILLARY: 156 mg/dL — AB (ref 70–99)
Glucose-Capillary: 142 mg/dL — ABNORMAL HIGH (ref 70–99)
Glucose-Capillary: 108 mg/dL — ABNORMAL HIGH (ref 70–99)
Glucose-Capillary: 111 mg/dL — ABNORMAL HIGH (ref 70–99)

## 2014-02-07 MED ORDER — APIXABAN 2.5 MG PO TABS
2.5000 mg | ORAL_TABLET | Freq: Two times a day (BID) | ORAL | Status: DC
  Administered 2014-02-07 – 2014-02-08 (×3): 2.5 mg via ORAL
  Filled 2014-02-07 (×5): qty 1

## 2014-02-07 MED ORDER — LEVOFLOXACIN IN D5W 750 MG/150ML IV SOLN
750.0000 mg | INTRAVENOUS | Status: DC
  Administered 2014-02-07: 750 mg via INTRAVENOUS
  Filled 2014-02-07: qty 150

## 2014-02-07 MED ORDER — APIXABAN 5 MG PO TABS
5.0000 mg | ORAL_TABLET | Freq: Two times a day (BID) | ORAL | Status: DC
  Filled 2014-02-07 (×2): qty 1

## 2014-02-07 MED ORDER — FUROSEMIDE 40 MG PO TABS
40.0000 mg | ORAL_TABLET | Freq: Two times a day (BID) | ORAL | Status: DC
  Filled 2014-02-07 (×3): qty 1

## 2014-02-07 NOTE — Progress Notes (Signed)
Pt ambulated using walker about 60 feet, out into the hallway and back into room.  Pt was initially on 1L O2 but sats dropped to 84% and pt was SOB.  O2 was increased to 2L and pt was able to tolerate walking back to room.  After O2 increased to 2L, sats remained 93-100%.  Family at bedside states that in 2010 pt was sent home on O2 after being diagnosed with PNA, and home O2 was used for 3-4 weeks.

## 2014-02-07 NOTE — Progress Notes (Signed)
Advanced Heart Failure Team Rounding Note  Referring Physician: [facility] Primary Physician: Dr Pete Glatter  Primary Cardiologist:  Dr Mayford Knife  Reason for Consultation: Diastolic Heart Failure  HPI:    The Heart Failure team was asked to provide a consult by [facility].   [name] is an 80 year old with a history of diastolic CHF EF 55-60% 12/2013, HTN, HLD, DM, MR and AS, chronic A-fib. OSA on CPAP.  She was last admitted 3/15 with volume overload. She was placed on lasix drip and later transitioned to lasix 40 mg twice a day. Discharge weight at that time was 161 pounds.  She was discharged to CIR.   While at CIR she developed hyponatremia and nephrology was consulted with recommendations to cut lasix back to 20 mg twice a day and restrict fluids. She was discharged to home from [facility] 01/30/14 with weight 160 pounds. Daughter prepares medications.   She presented to [facility]  ED after a fall with dyspnea and weakness. Found to have recurrent HF.   Lasix drip increased yesterday and metolazone added. Down 8 pounds. Breathing much better.    Objective:    Vital Signs:   Temp:  [98.1 F (36.7 C)] 98.1 F (36.7 C) (04/11 0549) Pulse Rate:  [61-86] 61 (04/11 1021) Resp:  [18] 18 (04/11 0549) BP: (107-118)/(52-78) 107/52 mmHg (04/11 1021) SpO2:  [94 %-98 %] 98 % (04/11 0549) Weight:  [66.951 kg (147 lb 9.6 oz)] 66.951 kg (147 lb 9.6 oz) (04/11 0549) Last BM Date: 02/04/14  Weight change: Filed Weights   02/04/14 0250 02/06/14 0900 02/07/14 0549  Weight: 76.068 kg (167 lb 11.2 oz) 70.308 kg (155 lb) 66.951 kg (147 lb 9.6 oz)    Intake/Output:   Intake/Output Summary (Last 24 hours) at 02/07/14 1403 Last data filed at 02/07/14 1200  Gross per 24 hour  Intake    420 ml  Output   6800 ml  Net  -6380 ml     Physical Exam: General:  Looks good sitting in chair. No resp difficulty HEENT: normal Neck: supple. JVP 6  Carotids 2+ bilat; no bruits. No lymphadenopathy or  thryomegaly appreciated. Cor: PMI nondisplaced. Irregular rate & rhythm. No rubs, gallops. TR. Lungs: clear Abdomen: soft, nontender, nondistended. No hepatosplenomegaly. No bruits or masses. Good bowel sounds. Extremities: no cyanosis, clubbing, rash, edema Neuro: alert & orientedx3, cranial nerves grossly intact. moves all 4 extremities w/o difficulty. Affect pleasant  Telemetry:  A fib 70  Labs: Basic Metabolic Panel:  Recent Labs Lab 02/04/14 0008 02/05/14 0645 02/05/14 1450 02/06/14 0526 02/07/14 0525  NA 133* 137 136* 138 138  K 4.1 4.1 3.8 3.7 4.0  CL 93* 94* 92* 94* 90*  CO2 26 30 28 30  36*  GLUCOSE 119* 104* 136* 84 94  BUN 29* 23 23 24* 28*  CREATININE 1.10 1.01 1.04 1.09 1.10  CALCIUM 9.0 8.3* 8.5 8.7 9.3    Liver Function Tests:  Recent Labs Lab 02/04/14 0008 02/05/14 0645 02/06/14 0526 02/07/14 0525  AST 62* 81* 61* 92*  ALT 37* 36* 35 42*  ALKPHOS 108 99 104 114  BILITOT 0.9 0.7 0.8 0.8  PROT 6.4 5.9* 6.3 6.7  ALBUMIN 2.8* 2.3* 2.4* 2.6*   No results found for this basename: LIPASE, AMYLASE,  in the last 168 hours No results found for this basename: AMMONIA,  in the last 168 hours  CBC:  Recent Labs Lab 02/03/14 2158 02/04/14 0008 02/05/14 0645 02/06/14 0526 02/07/14 0525  WBC  11.0* 12.3* 9.8 9.8 10.8*  NEUTROABS  --  9.5* 7.0 6.6 7.2  HGB 11.9* 11.8* 11.1* 11.9* 12.6  HCT 35.7* 34.9* 33.4* 35.6* 37.9  MCV 97.3 96.9 97.9 97.5 98.2  PLT 204 192 193 192 214    Cardiac Enzymes: No results found for this basename: CKTOTAL, CKMB, CKMBINDEX, TROPONINI,  in the last 168 hours  BNP: BNP (last 3 results)  Recent Labs  01/11/14 2206 01/12/14 0455 02/03/14 2158  PROBNP 2822.0* 3584.0* 2950.0*    CBG:  Recent Labs Lab 02/06/14 1209 02/06/14 1709 02/06/14 2137 02/07/14 0800 02/07/14 1210  GLUCAP 136* 164* 118* 142* 108*    Coagulation Studies: No results found for this basename: LABPROT, INR,  in the last 72 hours  Other  results: EKG: A fib RBBB 86 bpm    Imaging: No results found.   Medications:     Current Medications: . allopurinol  300 mg Oral Daily  . amiodarone  100 mg Oral Daily  . apixaban  2.5 mg Oral BID  . atorvastatin  10 mg Oral q1800  . calcium-vitamin D  1 tablet Oral Q breakfast  . gabapentin  300 mg Oral QHS  . insulin aspart  0-9 Units Subcutaneous TID WC  . levofloxacin (LEVAQUIN) IV  750 mg Intravenous Q48H  . levothyroxine  100 mcg Oral QAC breakfast  . metolazone  2.5 mg Oral BID  . metoprolol succinate  12.5 mg Oral Daily  . multivitamin with minerals  1 tablet Oral Daily  . omega-3 acid ethyl esters  1 g Oral BID  . oxybutynin  10 mg Oral QHS  . potassium chloride  20 mEq Oral BID  . sodium chloride  3 mL Intravenous Q12H  . vitamin C  500 mg Oral Daily  . zaleplon  10 mg Oral QHS    Infusions: . furosemide (LASIX) infusion 10 mg/hr (02/06/14 1633)     Assessment:   1. A/C diastolic heart failure EF 55-60%  2. Chronic A fib- on chronic amiodarone 3. TR 4. Fall 5. Hypothyroid- TSH 1.5 01/12/14  6. UTI 7. DM   Plan/Discussion:   .  She is now euvolemic. Will stop lasix gtt and metolazone. Will put on lasix 40 mg bid (was on 20 bid at home). Pradaxa switched to Eliquis. Can s top amio as she is rate controlled. Keep Foley in one more day. Possible d/c home tomorrow with close f/u in HF Clinic. Watch renal function.    Bevelyn BucklesDaniel R Bensimhon,MD 2:03 PM

## 2014-02-07 NOTE — Progress Notes (Signed)
PATIENT DETAILS Name: [name] Age: 80 y.o. Sex: female Date of Birth: June 17, 1924 Admit Date: 02/03/2014 Admitting Physician Doree Albee, MD LKG:[id number] Maisie Fus, MD  Subjective: No major issues overnight, feels much better. Inquiring about discharge.  Assessment/Plan: Active Problems: Acute Diastolic CHF -admitted and started on IV lasix, improved, but not sure whether she is back to her usual dry weight.  -Cards consulted, Lasix drip increased to 10 mg/hr, Metolazone added. More than 5 L output in the past 24 hours, weight has decreased to 147 pounds. Await further cardiology recommendations  Atrial Fibrillation -chronic -Cards recommending to decrease Amiodarone to 100 mg and to switch from Pradaxa to Eliquis, patient is also agreeable.  HCAP -afebrile and without leukocytosis -completed  4 days of Azactam,Vanco-will transition to Levaquin-to complete a total of 7 days of treatment -blood culture on 4/7 neg so far  Falls/Weakness -MRI brain neg for acute abnormalities -PT recommending HHPT on d/c -suspect secondary to CHF/Deconditioning/acute illness/advanced age  Aortic Stenosis/Mitral Regurgitation/Tricuspide Regurgitation -per cards-needs outpatient monitoring  Hyponatremia -resolved  Hypothyroidism -c/w Levothyroxine  DM -c/w SSI  OSA -c/w CPAP qhs  Disposition: Remain inpatient  DVT Prophylaxis: Not needed as on oral anticoagulation  Code Status: Full code   Family Communication None at bedside  Procedures:  None  CONSULTS:  cardiology  MEDICATIONS: Scheduled Meds: . allopurinol  300 mg Oral Daily  . amiodarone  100 mg Oral Daily  . atorvastatin  10 mg Oral q1800  . aztreonam  1 g Intravenous 3 times per day  . calcium-vitamin D  1 tablet Oral Q breakfast  . dabigatran  75 mg Oral Q12H  . gabapentin  300 mg Oral QHS  . insulin aspart  0-9 Units Subcutaneous TID WC  . levothyroxine  100 mcg Oral QAC breakfast    . metolazone  2.5 mg Oral BID  . metoprolol succinate  12.5 mg Oral Daily  . multivitamin with minerals  1 tablet Oral Daily  . omega-3 acid ethyl esters  1 g Oral BID  . oxybutynin  10 mg Oral QHS  . potassium chloride  20 mEq Oral BID  . sodium chloride  3 mL Intravenous Q12H  . vancomycin  750 mg Intravenous Q24H  . vitamin C  500 mg Oral Daily  . zaleplon  10 mg Oral QHS   Continuous Infusions: . furosemide (LASIX) infusion 10 mg/hr (02/06/14 1633)   PRN Meds:.sodium chloride, acetaminophen, sodium chloride  Antibiotics: Anti-infectives   Start     Dose/Rate Route Frequency Ordered Stop   02/05/14 0600  vancomycin (VANCOCIN) IVPB 750 mg/150 ml premix     750 mg 150 mL/hr over 60 Minutes Intravenous Every 24 hours 02/03/14 2350     02/04/14 2300  ceFEPIme (MAXIPIME) 1 g in dextrose 5 % 50 mL IVPB  Status:  Discontinued     1 g 100 mL/hr over 30 Minutes Intravenous Every 24 hours 02/03/14 2247 02/03/14 2310   02/04/14 2200  aztreonam (AZACTAM) 1 g in dextrose 5 % 50 mL IVPB     1 g 100 mL/hr over 30 Minutes Intravenous 3 times per day 02/04/14 1451 02/11/14 2159   02/03/14 2315  aztreonam (AZACTAM) 2 g in dextrose 5 % 50 mL IVPB  Status:  Discontinued     2 g 100 mL/hr over 30 Minutes Intravenous 3 times per day 02/03/14 2310 02/04/14 1451   02/03/14 2230  vancomycin (VANCOCIN) IVPB 1000 mg/200 mL premix  1,000 mg 200 mL/hr over 60 Minutes Intravenous  Once 02/03/14 2216 02/04/14 0111   02/03/14 2230  ceFEPIme (MAXIPIME) 2 g in dextrose 5 % 50 mL IVPB  Status:  Discontinued     2 g 100 mL/hr over 30 Minutes Intravenous  Once 02/03/14 2220 02/03/14 2310       PHYSICAL EXAM: Vital signs in last 24 hours: Filed Vitals:   02/06/14 1506 02/06/14 2135 02/06/14 2301 02/07/14 0549  BP:  118/78  112/73  Pulse: 86 81 84 67  Temp:  98.1 F (36.7 C)  98.1 F (36.7 C)  TempSrc:  Oral  Oral  Resp:  18 18 18   Height:      Weight:    66.951 kg (147 lb 9.6 oz)  SpO2:  94%   98%    Weight change:  Filed Weights   02/04/14 0250 02/06/14 0900 02/07/14 0549  Weight: 76.068 kg (167 lb 11.2 oz) 70.308 kg (155 lb) 66.951 kg (147 lb 9.6 oz)   Body mass index is 23.11 kg/(m^2).   Gen Exam: Awake and alert with clear speech.   Neck: Supple, No JVD.   Chest: Minimal Bibasilar rales CVS: S1 S2 Regular, no murmurs.  Abdomen: soft, BS +, non tender, non distended.  Extremities: no edema, lower extremities warm to touch. Neurologic: Non Focal.   Skin: No Rash.   Wounds: N/A.   Intake/Output from previous day:  Intake/Output Summary (Last 24 hours) at 02/07/14 0925 Last data filed at 02/07/14 0549  Gross per 24 hour  Intake    420 ml  Output   5650 ml  Net  -5230 ml     LAB RESULTS: CBC  Recent Labs Lab 02/03/14 2158 02/04/14 0008 02/05/14 0645 02/06/14 0526 02/07/14 0525  WBC 11.0* 12.3* 9.8 9.8 10.8*  HGB 11.9* 11.8* 11.1* 11.9* 12.6  HCT 35.7* 34.9* 33.4* 35.6* 37.9  PLT 204 192 193 192 214  MCV 97.3 96.9 97.9 97.5 98.2  MCH 32.4 32.8 32.6 32.6 32.6  MCHC 33.3 33.8 33.2 33.4 33.2  RDW 16.3* 16.3* 16.5* 16.6* 16.7*  LYMPHSABS  --  1.7 1.9 2.2 2.5  MONOABS  --  1.0 0.8 0.9 1.0  EOSABS  --  0.0 0.1 0.1 0.1  BASOSABS  --  0.0 0.0 0.0 0.0    Chemistries   Recent Labs Lab 02/04/14 0008 02/05/14 0645 02/05/14 1450 02/06/14 0526 02/07/14 0525  NA 133* 137 136* 138 138  K 4.1 4.1 3.8 3.7 4.0  CL 93* 94* 92* 94* 90*  CO2 26 30 28 30  36*  GLUCOSE 119* 104* 136* 84 94  BUN 29* 23 23 24* 28*  CREATININE 1.10 1.01 1.04 1.09 1.10  CALCIUM 9.0 8.3* 8.5 8.7 9.3    CBG:  Recent Labs Lab 02/06/14 0740 02/06/14 1209 02/06/14 1709 02/06/14 2137 02/07/14 0800  GLUCAP 124* 136* 164* 118* 142*    GFR Estimated Creatinine Clearance: 35 ml/min (by C-G formula based on Cr of 1.1).  Coagulation profile No results found for this basename: INR, PROTIME,  in the last 168 hours  Cardiac Enzymes No results found for this basename: CK,  CKMB, TROPONINI, MYOGLOBIN,  in the last 168 hours  No components found with this basename: POCBNP,  No results found for this basename: DDIMER,  in the last 72 hours No results found for this basename: HGBA1C,  in the last 72 hours No results found for this basename: CHOL, HDL, LDLCALC, TRIG, CHOLHDL, LDLDIRECT,  in the last  72 hours No results found for this basename: TSH, T4TOTAL, FREET3, T3FREE, THYROIDAB,  in the last 72 hours No results found for this basename: VITAMINB12, FOLATE, FERRITIN, TIBC, IRON, RETICCTPCT,  in the last 72 hours No results found for this basename: LIPASE, AMYLASE,  in the last 72 hours  Urine Studies No results found for this basename: UACOL, UAPR, USPG, UPH, UTP, UGL, UKET, UBIL, UHGB, UNIT, UROB, ULEU, UEPI, UWBC, URBC, UBAC, CAST, CRYS, UCOM, BILUA,  in the last 72 hours  MICROBIOLOGY: Recent Results (from the past 240 hour(s))  CULTURE, BLOOD (ROUTINE X 2)     Status: None   Collection Time    02/03/14 10:16 PM      Result Value Ref Range Status   Specimen Description BLOOD RIGHT HAND   Final   Special Requests BOTTLES DRAWN AEROBIC ONLY 5CC   Final   Culture  Setup Time     Final   Value: 02/04/2014 08:19     Performed at [facility]   Culture     Final   Value:        BLOOD CULTURE RECEIVED NO GROWTH TO DATE CULTURE WILL BE HELD FOR 5 DAYS BEFORE ISSUING A FINAL NEGATIVE REPORT     Performed at [facility]   Report Status PENDING   Incomplete  CULTURE, BLOOD (ROUTINE X 2)     Status: None   Collection Time    02/03/14 11:59 PM      Result Value Ref Range Status   Specimen Description BLOOD LEFT HAND   Final   Special Requests     Final   Value: BOTTLES DRAWN AEROBIC AND ANAEROBIC 10CC BLUE,3CC RED   Culture  Setup Time     Final   Value: 02/04/2014 08:19     Performed at [facility]   Culture     Final   Value:        BLOOD CULTURE RECEIVED NO GROWTH TO DATE CULTURE WILL BE HELD FOR 5 DAYS BEFORE ISSUING A FINAL  NEGATIVE REPORT     Performed at [facility]   Report Status PENDING   Incomplete    RADIOLOGY STUDIES/RESULTS: Dg Chest 2 View  02/03/2014   CLINICAL DATA:  Multiple falls, with generalized weakness.  EXAM: CHEST  2 VIEW  COMPARISON:  Chest radiograph performed 01/23/2014  FINDINGS: The lungs are well-aerated. Vascular congestion is noted, with mildly increased interstitial markings. Left basilar airspace opacification is noted, with a persistent small left pleural effusion, slightly improved from the prior study. No pneumothorax is seen.  The heart is mildly enlarged. Calcification is noted within the aortic arch. No acute osseous abnormalities are seen.  IMPRESSION: Vascular congestion and mild cardiomegaly, with mildly increased interstitial markings. Left basilar airspace opacity noted, with a residual small left pleural effusion. This may reflect mild persistent pulmonary edema or possibly pneumonia. No displaced rib fracture seen.   Electronically Signed   By: Roanna Raider M.D.   On: 02/03/2014 22:11   Dg Chest 2 View  01/23/2014   CLINICAL DATA:  Chronic congestive heart failure now with shortness of breath.  EXAM: CHEST  2 VIEW  COMPARISON:  DG CHEST 1V PORT dated 01/11/2014  FINDINGS: The lungs are mildly hypoinflated. The pulmonary interstitial markings are increased. The pulmonary vascularity is engorged and indistinct. The cardiopericardial silhouette is enlarged. The left hemidiaphragm is largely obscured. The observed portions of the bony thorax exhibit no acute abnormalities.  IMPRESSION: The  findings are consistent with congestive heart failure with pulmonary interstitial edema and a moderate size left pleural effusion. There has been deterioration in the appearance of the chest since the previous study.   Electronically Signed   By: David  [place]   On: 01/23/2014 10:07   Mr Brain Wo Contrast  02/04/2014   CLINICAL DATA:  Bilateral weakness, worse on the left. History of CVA.   EXAM: MRI HEAD WITHOUT CONTRAST  TECHNIQUE: Multiplanar, multiecho pulse sequences of the brain and surrounding structures were obtained without intravenous contrast.  COMPARISON:  MRI brain 09/05/2010  FINDINGS: Moderate generalized atrophy and white matter disease has progressed since prior exam. No acute infarct, hemorrhage, or mass lesion is present. And the ventricles are proportionate to the degree of atrophy. No significant extraaxial fluid collection is present.  Flow is present in the major intracranial arteries. Remote lacunar infarcts of the basal ganglia and cerebellum are stable.  Patient is status post bilateral lens replacements. Mild mucosal thickening is present in the right sphenoid sinus. The remaining paranasal sinuses are clear. There is minimal fluid in the mastoid air cells. No obstructing nasopharyngeal lesion is evident.  IMPRESSION: 1. No acute intracranial abnormality. 2. Progressive atrophy and white matter disease likely reflects the sequela of chronic microvascular ischemia. 3. Minimal right sphenoid sinus disease. 4. Small mastoid effusions. No obstructing nasopharyngeal lesion is evident.   Electronically Signed   By: Gennette Pac M.D.   On: 02/04/2014 08:51   Dg Chest Port 1 View  01/11/2014   CLINICAL DATA:  Shortness of Breath  EXAM: PORTABLE CHEST - 1 VIEW  COMPARISON:  January 05, 2014  FINDINGS: There is no edema or consolidation. Heart is enlarged with mild pulmonary venous hypertension. No adenopathy. No bone lesions.  IMPRESSION: Evidence of volume overload with cardiomegaly and pulmonary venous hypertension. No overt edema or consolidation, however. No appreciable effusions.   Electronically Signed   By: Bretta Bang M.D.   On: 01/11/2014 21:58    Ariyon Mittleman Levora Dredge, MD  [facility] Pager:[phone]  If 7PM-7AM, please contact night-coverage [email or web address] Password [email or web address] 02/07/2014, 9:25 AM   LOS: 4 days

## 2014-02-07 NOTE — Progress Notes (Addendum)
ANTIBIOTIC CONSULT NOTE - INITIAL  Pharmacy Consult for Levaquin; Pradaxa--> Eliquis Indication: HCAP; Afib  Allergies  Allergen Reactions  . Aspirin Other (See Comments)    Tears my stomach up; "can take coated Aspirin qd without problems"  . Ambien [Zolpidem Tartrate]     [place]  . Nexium [Esomeprazole Magnesium] Rash  . Penicillins Rash    Tolerates Rocephin    Patient Measurements: Height: 5\' 7"  (170.2 cm) Weight: 147 lb 9.6 oz (66.951 kg) IBW/kg (Calculated) : 61.6  Vital Signs: Temp: 98.1 F (36.7 C) (04/11 0549) Temp src: Oral (04/11 0549) BP: 112/73 mmHg (04/11 0549) Pulse Rate: 67 (04/11 0549) Intake/Output from previous day: 04/10 0701 - 04/11 0700 In: 420 [I.V.:120; IV Piggyback:300] Out: 5650 [Urine:5650] Intake/Output from this shift:    Labs:  Recent Labs  02/05/14 0645 02/05/14 1450 02/06/14 0526 02/07/14 0525  WBC 9.8  --  9.8 10.8*  HGB 11.1*  --  11.9* 12.6  PLT 193  --  192 214  CREATININE 1.01 1.04 1.09 1.10   Estimated Creatinine Clearance: 35 ml/min (by C-G formula based on Cr of 1.1). No results found for this basename: VANCOTROUGH, VANCOPEAK, VANCORANDOM, GENTTROUGH, GENTPEAK, GENTRANDOM, TOBRATROUGH, TOBRAPEAK, TOBRARND, AMIKACINPEAK, AMIKACINTROU, AMIKACIN,  in the last 72 hours   Microbiology: Recent Results (from the past 720 hour(s))  URINE CULTURE     Status: None   Collection Time    01/12/14  1:03 AM      Result Value Ref Range Status   Specimen Description URINE, CLEAN CATCH   Final   Special Requests NONE   Final   Culture  Setup Time     Final   Value: 01/12/2014 01:19     [facility]formed [facility]   Colony Count     Final   Value: 70,000 COLONIES/ML     [facility]   Culture     Final   Value: ENTEROCOCCUS SPECIES     [facility]   Report Status 01/13/2014 FINAL   Final   Organism ID, Bacteria ENTEROCOCCUS SPECIES   Final  URINE CULTURE      Status: None   Collection Time    01/23/14 11:00 AM      Result Value Ref Range Status   Specimen Description URINE, CLEAN CATCH   Final   Special Requests NONE   Final   Culture  Setup Time     Final   Value: 01/23/2014 11:53     Performed at [facility]   Colony Count     Final   Value: >=100,000 COLONIES/ML     [facility]   Culture     Final   Value: ENTEROCOCCUS SPECIES     [facility] at [facility]   Report Status 01/25/2014 FINAL   Final   Organism ID, Bacteria ENTEROCOCCUS SPECIES   Final  CULTURE, BLOOD (ROUTINE X 2)     Status: None   Collection Time    02/03/14 10:16 PM      Result Value Ref Range Status   Specimen Description BLOOD RIGHT HAND   Final   Special Requests BOTTLES DRAWN AEROBIC ONLY 5CC   Final   Culture  Setup Time     Final   Value: 02/04/2014 08:19     [facility]formed at [facility]   Culture     Final   Value:  BLOOD CULTURE RECEIVED NO GROWTH TO DATE CULTURE WILL BE HELD FOR 5 DAYS BEFORE ISSUING A FINAL NEGATIVE REPORT     Performed at [facility]   Report Status PENDING   Incomplete  CULTURE, BLOOD (ROUTINE X 2)     Status: None   Collection Time    02/03/14 11:59 PM      Result Value Ref Range Status   Specimen Description BLOOD LEFT HAND   Final   Special Requests     Final   Value: BOTTLES DRAWN AEROBIC AND ANAEROBIC 10CC BLUE,3CC RED   Culture  Setup Time     Final   Value: 02/04/2014 08:19     Performed at [facility]   Culture     Final   Value:        BLOOD CULTURE RECEIVED NO GROWTH TO DATE CULTURE WILL BE HELD FOR 5 DAYS BEFORE ISSUING A FINAL NEGATIVE REPORT     Performed at [facility]   Report Status PENDING   Incomplete    Medical History: Past Medical History  Diagnosis Date  . Hypertension   . Peripheral neuropathy   . Asthma   . High cholesterol   . Diabetes with neurologic complications   . PAD (peripheral artery disease)   . Enlarged  heart   . Anginal pain 05/28/12  . Pneumonia 11/2010  . Sleep apnea     cpap  . Hypothyroidism   . Headache(784.0) 05/29/12    "recently"  . Stroke 05/2005; 08/2010    !mini"  . Personal history of gout   . Hypothyroidism   . DJD (degenerative joint disease)   . Carpal tunnel syndrome, bilateral   . Recurrent labyrinthitis   . GERD (gastroesophageal reflux disease)     S/P esophageal dilation in 1996  . Gallstone     Silent  . Fibrocystic breast changes   . Hemorrhoids     with bleeding  . Hx of adenomatous colonic polyps     Dr Sherin Quarry  . Diverticulitis     left side  . MR (mitral regurgitation)     moderate  . Mild anemia     hemoglobin 11.8 on 06/2009  . Edema of lower extremity     Chronic  . Ischemic colitis 8/13  . Aortic stenosis, moderate   . Chronic diastolic CHF (congestive heart failure)   . Moderate mitral regurgitation   . CKD (chronic kidney disease) stage 3, GFR 30-59 ml/min     "something on labs always say she has some problems"  . Chronic atrial fibrillation    Assessment: Patient is an 80 y.o F on pradaxa for afib.  To change anticoagulation to Eliquis today.  Patient only meets one criteria for 2.5mg  dose but HF team recommends this since she was on lower dose of pradaxa.  She's also on antibiotics with vancomycin and aztreonam day #4 for suspected HCAP.  All cultures have been negative thus far.  She remains afebrile and wbc increased slightly over night to 10.8. Per Dr. Windell Norfolk, to change abx to Levaquin today to complete a total of 7 day treatment course.  Plan:  1) Eliquis 2.5mg  bid 2) Levaquin 750mg  IV q48h for 3 more days 3) monitor renal function  Sakara Lehtinen P Crystelle Ferrufino 02/07/2014,9:39 AM

## 2014-02-08 LAB — CBC WITH DIFFERENTIAL/PLATELET
Basophils Absolute: 0 10*3/uL (ref 0.0–0.1)
Basophils Relative: 0 % (ref 0–1)
Eosinophils Absolute: 0.1 10*3/uL (ref 0.0–0.7)
Eosinophils Relative: 1 % (ref 0–5)
HEMATOCRIT: 39.7 % (ref 36.0–46.0)
HEMOGLOBIN: 13.1 g/dL (ref 12.0–15.0)
LYMPHS ABS: 2.5 10*3/uL (ref 0.7–4.0)
LYMPHS PCT: 22 % (ref 12–46)
MCH: 32.2 pg (ref 26.0–34.0)
MCHC: 33 g/dL (ref 30.0–36.0)
MCV: 97.5 fL (ref 78.0–100.0)
MONO ABS: 0.9 10*3/uL (ref 0.1–1.0)
MONOS PCT: 8 % (ref 3–12)
Neutro Abs: 8 10*3/uL — ABNORMAL HIGH (ref 1.7–7.7)
Neutrophils Relative %: 69 % (ref 43–77)
Platelets: 210 10*3/uL (ref 150–400)
RBC: 4.07 MIL/uL (ref 3.87–5.11)
RDW: 16.4 % — ABNORMAL HIGH (ref 11.5–15.5)
WBC: 11.5 10*3/uL — AB (ref 4.0–10.5)

## 2014-02-08 LAB — BASIC METABOLIC PANEL
BUN: 32 mg/dL — AB (ref 6–23)
CHLORIDE: 87 meq/L — AB (ref 96–112)
CO2: 33 meq/L — AB (ref 19–32)
CREATININE: 1.28 mg/dL — AB (ref 0.50–1.10)
Calcium: 9.4 mg/dL (ref 8.4–10.5)
GFR calc Af Amer: 42 mL/min — ABNORMAL LOW (ref >90)
GFR calc non Af Amer: 36 mL/min — ABNORMAL LOW (ref >90)
Glucose, Bld: 93 mg/dL (ref 70–99)
Potassium: 3.8 mEq/L (ref 3.7–5.3)
Sodium: 133 mEq/L — ABNORMAL LOW (ref 137–147)

## 2014-02-08 LAB — GLUCOSE, CAPILLARY
Glucose-Capillary: 94 mg/dL (ref 70–99)
Glucose-Capillary: 140 mg/dL — ABNORMAL HIGH (ref 70–99)

## 2014-02-08 MED ORDER — APIXABAN 2.5 MG PO TABS: 2.5000 mg | ORAL_TABLET | Freq: Two times a day (BID) | ORAL | Status: AC

## 2014-02-08 MED ORDER — LEVOFLOXACIN 750 MG PO TABS: 750.0000 mg | ORAL_TABLET | ORAL | Status: DC

## 2014-02-08 MED ORDER — FUROSEMIDE 40 MG PO TABS: 40.0000 mg | ORAL_TABLET | Freq: Two times a day (BID) | ORAL | Status: DC

## 2014-02-08 NOTE — Progress Notes (Signed)
SATURATION QUALIFICATIONS: (This note is used to comply with regulatory documentation for home oxygen)  Patient Saturations on Room Air at Rest =90%  Patient Saturations on Room Air while Ambulating = 84%  Patient Saturations on 2 Liters of oxygen while Ambulating = 93%  Please briefly explain why patient needs home oxygen: CHF

## 2014-02-08 NOTE — Progress Notes (Addendum)
   CARE MANAGEMENT NOTE 02/08/2014  Patient:  [name],[name]   Account Number:  [id number]  Date Initiated:  02/05/2014  Documentation initiated by:  [name],[name]  Subjective/Objective Assessment:   Pt admitted with CHF     Action/Plan:   PTA pt lived at home- [facility]-- will need resumption orders at time of discharge   Anticipated DC Date:  02/09/2014   Anticipated DC Plan:  HOME W HOME [name] SERVICES      DC Planning Services  CM consult      [facility]   Choice offered to / List presented to:          [name] arranged  HH-1 RN  HH-2 PT  HH-3 OT  HH-4 NURSE'S AIDE  HH-6 SOCIAL WORKER      HH agency  [name]   Status of service:  Completed, signed off Medicare Important Message given?   (If response is "NO", the following Medicare IM given date fields will be blank) Date Medicare IM given:   Date Additional Medicare IM given:    Discharge Disposition:  HOME W HOME [name] SERVICES  Per UR Regulation:  Reviewed for med. necessity/level of care/duration of stay  If discussed at Long Length of Stay Meetings, dates discussed:    Comments:  02/08/14 15:00 CM spoke with pt and family in room for discharge planning.  Family expresssed their desire to have [name] as they were unhappy with [facility] response time.  CM spoke with [name] who stated they could not start until wednesday.  [facility] request and bc of staffing earliest [name] would also be wednesday.  CM spoke with family and [facility] earliest Monday and latest Tuesday.  Family agreed to have Judith render HHPT/OT/RN/SW/aide.  Ref[facility] called to [facility] rep, [name] with daughter, [name] [phone] as contact to schedule SOC.  O2 to be delivered to room for transport home by family.  CSW called for ambulance transport home of pt.  No other CM needs were communicated.  [name], BSN, [place] [phone].   02/06/14- 1445- Judith PieriniKristi Webster RN, BSN [phone] Referral to check benefit coverage for [name]- per insurance check- pt does have insurance coverage for Eliqius- and pt's cost would be 20% of the [facility]- [facility] to [facility] true cost of drug- per pt and family pt has not used mail order in several years- [facility] [place]. Per family they would like pt's cardiologist Dr. Mayford Knifeurner to be aware and ok with change to [name]. NCM to cont. to follow for further assistance per PT - pt has used [facility] in past- would like to use [name] now- confirmed this with pt and son in room- [name] - pt will need new HH orders and [facility] at time of discharge.

## 2014-02-08 NOTE — Progress Notes (Signed)
Subjective: Resting comfortably with CPAP on. No dyspnea. Wants to go home.   Objective: Vital signs in last 24 hours: Temp:  [97.9 F (36.6 C)-98.4 F (36.9 C)] 97.9 F (36.6 C) (04/12 0535) Pulse Rate:  [61-93] 62 (04/12 0535) Resp:  [15-16] 15 (04/12 0535) BP: (107-117)/(52-70) 117/70 mmHg (04/12 0535) SpO2:  [95 %] 95 % (04/12 0535) Weight:  [148 lb 6.4 oz (67.314 kg)] 148 lb 6.4 oz (67.314 kg) (04/12 0535) Last BM Date: 02/06/14  Intake/Output from previous day: 04/11 0701 - 04/12 0700 In: 352 [I.V.:202; IV Piggyback:150] Out: 1850 [Urine:1850] Intake/Output this shift:    Medications Current Facility-Administered Medications  Medication Dose Route Frequency Provider Last Rate Last Dose  . 0.9 %  sodium chloride infusion  250 mL Intravenous PRN Doree AlbeeSteven Newton, MD      . acetaminophen (TYLENOL) tablet 500 mg  500 mg Oral Q6H PRN Doree AlbeeSteven Newton, MD      . allopurinol (ZYLOPRIM) tablet 300 mg  300 mg Oral Daily Doree AlbeeSteven Newton, MD   300 mg at 02/07/14 1026  . apixaban (ELIQUIS) tablet 2.5 mg  2.5 mg Oral BID Anh P Pham, RPH   2.5 mg at 02/07/14 2254  . atorvastatin (LIPITOR) tablet 10 mg  10 mg Oral q1800 Marinda ElkAbraham Feliz Ortiz, MD   10 mg at 02/07/14 1719  . calcium-vitamin D (OSCAL WITH D) 500-200 MG-UNIT per tablet 1 tablet  1 tablet Oral Q breakfast Doree AlbeeSteven Newton, MD   1 tablet at 02/07/14 0851  . furosemide (LASIX) tablet 40 mg  40 mg Oral BID Dolores Pattyaniel R Bensimhon, MD      . gabapentin (NEURONTIN) capsule 300 mg  300 mg Oral QHS Doree AlbeeSteven Newton, MD   300 mg at 02/07/14 2239  . insulin aspart (novoLOG) injection 0-9 Units  0-9 Units Subcutaneous TID WC Doree AlbeeSteven Newton, MD   1 Units at 02/07/14 [phone]  . levofloxacin (LEVAQUIN) IVPB 750 mg  750 mg Intravenous Q48H Anh P Pham, RPH   750 mg at 02/07/14 1314  . levothyroxine (SYNTHROID, LEVOTHROID) tablet 100 mcg  100 mcg Oral QAC breakfast Marinda ElkAbraham Feliz Ortiz, MD   100 mcg at 02/07/14 [phone]  . metoprolol succinate (TOPROL-XL) 24 hr  tablet 12.5 mg  12.5 mg Oral Daily Doree AlbeeSteven Newton, MD   12.5 mg at 02/07/14 1026  . multivitamin with minerals tablet 1 tablet  1 tablet Oral Daily Doree AlbeeSteven Newton, MD   1 tablet at 02/07/14 1026  . omega-3 acid ethyl esters (LOVAZA) capsule 1 g  1 g Oral BID Doree AlbeeSteven Newton, MD   1 g at 02/07/14 2239  . oxybutynin (DITROPAN) tablet 10 mg  10 mg Oral QHS Doree AlbeeSteven Newton, MD   10 mg at 02/07/14 2239  . potassium chloride SA (K-DUR,KLOR-CON) CR tablet 20 mEq  20 mEq Oral BID Dolores Pattyaniel R Bensimhon, MD   20 mEq at 02/07/14 2239  . sodium chloride 0.9 % injection 3 mL  3 mL Intravenous Q12H Doree AlbeeSteven Newton, MD   3 mL at 02/05/14 2118  . sodium chloride 0.9 % injection 3 mL  3 mL Intravenous PRN Doree AlbeeSteven Newton, MD      . vitamin C (ASCORBIC ACID) tablet 500 mg  500 mg Oral Daily Doree AlbeeSteven Newton, MD   500 mg at 02/07/14 1026  . zaleplon (SONATA) capsule 10 mg  10 mg Oral QHS Marinda ElkAbraham Feliz Ortiz, MD   10 mg at 02/07/14 2240    PE: General appearance: alert, cooperative and no distress Lungs:  clear to auscultation bilaterally Heart: irregularly irregular rhythm, 2/6 sys MM Extremities: No LEE Pulses: 1+ and symmetric Skin: Warm and dry Neurologic: Grossly normal  Lab Results:   Recent Labs  02/06/14 0526 02/07/14 0525 02/08/14 0612  WBC 9.8 10.8* 11.5*  HGB 11.9* 12.6 13.1  HCT 35.6* 37.9 39.7  PLT 192 214 210   BMET  Recent Labs  02/05/14 1450 02/06/14 0526 02/07/14 0525  NA 136* 138 138  K 3.8 3.7 4.0  CL 92* 94* 90*  CO2 28 30 36*  GLUCOSE 136* 84 94  BUN 23 24* 28*  CREATININE 1.04 1.09 1.10  CALCIUM 8.5 8.7 9.3    Assessment/Plan Mrs Dalal is an 80 year old with a history of diastolic CHF EF 55-60% 12/2013, HTN, HLD, DM, MR and AS, chronic A-fib. OSA on CPAP. She was last admitted 3/15 with volume overload. She was placed on lasix drip and later transitioned to lasix 40 mg twice a day. Discharge weight at that time was 161 pounds. She was discharged to CIR. While at CIR she developed  hyponatremia and nephrology was consulted with recommendations to cut lasix back to 20 mg twice a day and restrict fluids. She was discharged to home [facility] 01/30/14 with weight 160 pounds. Daughter prepares medications.   She presented to [facility] ED after a fall with dyspnea and weakness. Found to have recurrent HF.    Active Problems: 1. A/C diastolic heart failure EF 55-60%  2. Chronic A fib- on chronic amiodarone  3. TR  4. Fall  5. Hypothyroid- TSH 1.5 01/12/14 -Synthroid. 6. UTI  7. DM  Patient was changed to PO lasix 40mg  BID yesterday.  Net fluids -1.5L/-10.1L.  Weight up 1 pound. She appears euvolemic.  In aflutter with variable block.  Rate controlled. Can stop amio. Pradaxa switched to Eliquis.   Hold lasix today, Start 40 po bid tomorrow. Will arrange f/u in HF clinic this week (message sent) with BMET. May need metolazone 2.5 once oer week for weight > 150. We will decide this in Clinic.     LOS: 5 days    Wilburt Finlay PA-C 02/08/2014 7:37 AM   Patient seen and examined independently. Bryan Hager's note reviewed carefully - agree with his assessment and plan. I have edited the note based on my findings. Ok to go home  Reuel Boom R Bensimhon,MD 1:27 PM

## 2014-02-08 NOTE — Progress Notes (Addendum)
PATIENT DETAILS Name: [name] Age: 80 y.o. Sex: female Date of Birth: [birth year] Admit Date: 02/03/2014 Admitting Physician Doree Albee, MD [id number] Maisie Fus, MD  Subjective: No major issues overnight,breathing much better, wants to go home  Assessment/Plan: Active Problems: Acute on chronic Diastolic CHF -admitted and started on IV lasix gtt, improved significantly, Cards consulted, Lasix now changed to oral. Weight decreased to 148 lbs from 156 pounds on admission.Also more than 10 litre's neg balance.  Await further cardiology recommendations regarding discharge regimen on diuretics.  Acute Respiratory Failure-Hypoxic -suspect multifactorial-CHF/Deconditioning/Frailty etc. Desaturates on ambulation -since CHF improving, may need a short duration of Home O2  Atrial Fibrillation -chronic -Cards initially recommended to decrease Amiodarone to 100 mg, however eventually discontinued, also switched from Pradaxa to Eliquis.  HCAP -afebrile and without leukocytosis -completed  4 days of Azactam,Vanco-will transition to Levaquin-to complete a total of 7 days of treatment -blood culture on 4/7 neg so far  Falls/Weakness -MRI brain neg for acute abnormalities -PT recommending HHPT on d/c -suspect secondary to CHF/Deconditioning/acute illness/advanced age  Aortic Stenosis/Mitral Regurgitation/Tricuspide Regurgitation -per cards-needs outpatient monitoring  Hyponatremia -mild hyponatremia persists  Hypothyroidism -c/w Levothyroxine  DM -c/w SSI  OSA -c/w CPAP qhs  Disposition: Remain inpatient-suspect home later today  DVT Prophylaxis: Not needed as on oral anticoagulation  Code Status: Full code   Family Communication None at bedside  Procedures:  None  CONSULTS:  cardiology  MEDICATIONS: Scheduled Meds: . allopurinol  300 mg Oral Daily  . apixaban  2.5 mg Oral BID  . atorvastatin  10 mg Oral q1800  . calcium-vitamin D  1  tablet Oral Q breakfast  . furosemide  40 mg Oral BID  . gabapentin  300 mg Oral QHS  . insulin aspart  0-9 Units Subcutaneous TID WC  . levofloxacin (LEVAQUIN) IV  750 mg Intravenous Q48H  . levothyroxine  100 mcg Oral QAC breakfast  . metoprolol succinate  12.5 mg Oral Daily  . multivitamin with minerals  1 tablet Oral Daily  . omega-3 acid ethyl esters  1 g Oral BID  . oxybutynin  10 mg Oral QHS  . potassium chloride  20 mEq Oral BID  . sodium chloride  3 mL Intravenous Q12H  . vitamin C  500 mg Oral Daily  . zaleplon  10 mg Oral QHS   Continuous Infusions:   PRN Meds:.sodium chloride, acetaminophen, sodium chloride  Antibiotics: Anti-infectives   Start     Dose/Rate Route Frequency Ordered Stop   02/07/14 1100  levofloxacin (LEVAQUIN) IVPB 750 mg     750 mg 100 mL/hr over 90 Minutes Intravenous Every 48 hours 02/07/14 1008 02/11/14 1059   02/05/14 0600  vancomycin (VANCOCIN) IVPB 750 mg/150 ml premix  Status:  Discontinued     750 mg 150 mL/hr over 60 Minutes Intravenous Every 24 hours 02/03/14 2350 02/07/14 0929   02/04/14 2300  ceFEPIme (MAXIPIME) 1 g in dextrose 5 % 50 mL IVPB  Status:  Discontinued     1 g 100 mL/hr over 30 Minutes Intravenous Every 24 hours 02/03/14 2247 02/03/14 2310   02/04/14 2200  aztreonam (AZACTAM) 1 g in dextrose 5 % 50 mL IVPB  Status:  Discontinued     1 g 100 mL/hr over 30 Minutes Intravenous 3 times per day 02/04/14 1451 02/07/14 0929   02/03/14 2315  aztreonam (AZACTAM) 2 g in dextrose 5 % 50 mL IVPB  Status:  Discontinued  2 g 100 mL/hr over 30 Minutes Intravenous 3 times per day 02/03/14 2310 02/04/14 1451   02/03/14 2230  vancomycin (VANCOCIN) IVPB 1000 mg/200 mL premix     1,000 mg 200 mL/hr over 60 Minutes Intravenous  Once 02/03/14 2216 02/04/14 0111   02/03/14 2230  ceFEPIme (MAXIPIME) 2 g in dextrose 5 % 50 mL IVPB  Status:  Discontinued     2 g 100 mL/hr over 30 Minutes Intravenous  Once 02/03/14 2220 02/03/14 2310        PHYSICAL EXAM: Vital signs in last 24 hours: Filed Vitals:   02/07/14 1021 02/07/14 2100 02/08/14 0000 02/08/14 0535  BP: 107/52 110/57  117/70  Pulse: 61 93 88 62  Temp:  98.4 F (36.9 C)  97.9 F (36.6 C)  TempSrc:    Oral  Resp:  16 16 15   Height:      Weight:    67.314 kg (148 lb 6.4 oz)  SpO2:  95%  95%    Weight change: -2.994 kg (-6 lb 9.6 oz) Filed Weights   02/06/14 0900 02/07/14 0549 02/08/14 0535  Weight: 70.308 kg (155 lb) 66.951 kg (147 lb 9.6 oz) 67.314 kg (148 lb 6.4 oz)   Body mass index is 23.24 kg/(m^2).   Gen Exam: Awake and alert with clear speech.   Neck: Supple, No JVD.   Chest: Clear bilaterally CVS: S1 S2 Regular, no murmurs.  Abdomen: soft, BS +, non tender, non distended.  Extremities: no edema, lower extremities warm to touch. Neurologic: Non Focal.   Skin: No Rash.   Wounds: N/A.   Intake/Output from previous day:  Intake/Output Summary (Last 24 hours) at 02/08/14 0854 Last data filed at 02/07/14 2142  Gross per 24 hour  Intake    352 ml  Output   1850 ml  Net  -1498 ml     LAB RESULTS: CBC  Recent Labs Lab 02/04/14 0008 02/05/14 0645 02/06/14 0526 02/07/14 0525 02/08/14 0612  WBC 12.3* 9.8 9.8 10.8* 11.5*  HGB 11.8* 11.1* 11.9* 12.6 13.1  HCT 34.9* 33.4* 35.6* 37.9 39.7  PLT 192 193 192 214 210  MCV 96.9 97.9 97.5 98.2 97.5  MCH 32.8 32.6 32.6 32.6 32.2  MCHC 33.8 33.2 33.4 33.2 33.0  RDW 16.3* 16.5* 16.6* 16.7* 16.4*  LYMPHSABS 1.7 1.9 2.2 2.5 2.5  MONOABS 1.0 0.8 0.9 1.0 0.9  EOSABS 0.0 0.1 0.1 0.1 0.1  BASOSABS 0.0 0.0 0.0 0.0 0.0    Chemistries   Recent Labs Lab 02/05/14 0645 02/05/14 1450 02/06/14 0526 02/07/14 0525 02/08/14 0612  NA 137 136* 138 138 133*  K 4.1 3.8 3.7 4.0 3.8  CL 94* 92* 94* 90* 87*  CO2 30 28 30  36* 33*  GLUCOSE 104* 136* 84 94 93  BUN 23 23 24* 28* 32*  CREATININE 1.01 1.04 1.09 1.10 1.28*  CALCIUM 8.3* 8.5 8.7 9.3 9.4    CBG:  Recent Labs Lab 02/07/14 0800  02/07/14 1210 02/07/14 1646 02/07/14 2105 02/08/14 0738  GLUCAP 142* 108* 111* 156* 94    GFR Estimated Creatinine Clearance: 30.1 ml/min (by C-G formula based on Cr of 1.28).  Coagulation profile No results found for this basename: INR, PROTIME,  in the last 168 hours  Cardiac Enzymes No results found for this basename: CK, CKMB, TROPONINI, MYOGLOBIN,  in the last 168 hours  No components found with this basename: POCBNP,  No results found for this basename: DDIMER,  in the last 72 hours No  results found for this basename: HGBA1C,  in the last 72 hours No results found for this basename: CHOL, HDL, LDLCALC, TRIG, CHOLHDL, LDLDIRECT,  in the last 72 hours No results found for this basename: TSH, T4TOTAL, FREET3, T3FREE, THYROIDAB,  in the last 72 hours No results found for this basename: VITAMINB12, FOLATE, FERRITIN, TIBC, IRON, RETICCTPCT,  in the last 72 hours No results found for this basename: LIPASE, AMYLASE,  in the last 72 hours  Urine Studies No results found for this basename: UACOL, UAPR, USPG, UPH, UTP, UGL, UKET, UBIL, UHGB, UNIT, UROB, ULEU, UEPI, UWBC, URBC, UBAC, CAST, CRYS, UCOM, BILUA,  in the last 72 hours  MICROBIOLOGY: Recent Results (from the past 240 hour(s))  CULTURE, BLOOD (ROUTINE X 2)     Status: None   Collection Time    02/03/14 10:16 PM      Result Value Ref Range Status   Specimen Description BLOOD RIGHT HAND   Final   Special Requests BOTTLES DRAWN AEROBIC ONLY 5CC   Final   Culture  Setup Time     Final   Value: 02/04/2014 08:19     Performed at [facility]   Culture     Final   Value:        BLOOD CULTURE RECEIVED NO GROWTH TO DATE CULTURE WILL BE HELD FOR 5 DAYS BEFORE ISSUING A FINAL NEGATIVE REPORT     Performed at [facility]   Report Status PENDING   Incomplete  CULTURE, BLOOD (ROUTINE X 2)     Status: None   Collection Time    02/03/14 11:59 PM      Result Value Ref Range Status   Specimen Description BLOOD LEFT  HAND   Final   Special Requests     Final   Value: BOTTLES DRAWN AEROBIC AND ANAEROBIC 10CC BLUE,3CC RED   Culture  Setup Time     Final   Value: 02/04/2014 08:19     Performed at [facility]   Culture     Final   Value:        BLOOD CULTURE RECEIVED NO GROWTH TO DATE CULTURE WILL BE HELD FOR 5 DAYS BEFORE ISSUING A FINAL NEGATIVE REPORT     Performed at [facility]   Report Status PENDING   Incomplete    RADIOLOGY STUDIES/RESULTS: Dg Chest 2 View  02/03/2014   CLINICAL DATA:  Multiple falls, with generalized weakness.  EXAM: CHEST  2 VIEW  COMPARISON:  Chest radiograph performed 01/23/2014  FINDINGS: The lungs are well-aerated. Vascular congestion is noted, with mildly increased interstitial markings. Left basilar airspace opacification is noted, with a persistent small left pleural effusion, slightly improved from the prior study. No pneumothorax is seen.  The heart is mildly enlarged. Calcification is noted within the aortic arch. No acute osseous abnormalities are seen.  IMPRESSION: Vascular congestion and mild cardiomegaly, with mildly increased interstitial markings. Left basilar airspace opacity noted, with a residual small left pleural effusion. This may reflect mild persistent pulmonary edema or possibly pneumonia. No displaced rib fracture seen.   Electronically Signed   By: Roanna RaiderJeffery  Chang M.D.   On: 02/03/2014 22:11   Dg Chest 2 View  01/23/2014   CLINICAL DATA:  Chronic congestive heart failure now with shortness of breath.  EXAM: CHEST  2 VIEW  COMPARISON:  DG CHEST 1V PORT dated 01/11/2014  FINDINGS: The lungs are mildly hypoinflated. The pulmonary interstitial markings are increased. The pulmonary vascularity is  engorged and indistinct. The cardiopericardial silhouette is enlarged. The left hemidiaphragm is largely obscured. The observed portions of the bony thorax exhibit no acute abnormalities.  IMPRESSION: The findings are consistent with congestive heart failure  with pulmonary interstitial edema and a moderate size left pleural effusion. There has been deterioration in the appearance of the chest since the previous study.   Electronically Signed   By: David  [place]   On: 01/23/2014 10:07   Mr Brain Wo Contrast  02/04/2014   CLINICAL DATA:  Bilateral weakness, worse on the left. History of CVA.  EXAM: MRI HEAD WITHOUT CONTRAST  TECHNIQUE: Multiplanar, multiecho pulse sequences of the brain and surrounding structures were obtained without intravenous contrast.  COMPARISON:  MRI brain 09/05/2010  FINDINGS: Moderate generalized atrophy and white matter disease has progressed since prior exam. No acute infarct, hemorrhage, or mass lesion is present. And the ventricles are proportionate to the degree of atrophy. No significant extraaxial fluid collection is present.  Flow is present in the major intracranial arteries. Remote lacunar infarcts of the basal ganglia and cerebellum are stable.  Patient is status post bilateral lens replacements. Mild mucosal thickening is present in the right sphenoid sinus. The remaining paranasal sinuses are clear. There is minimal fluid in the mastoid air cells. No obstructing nasopharyngeal lesion is evident.  IMPRESSION: 1. No acute intracranial abnormality. 2. Progressive atrophy and white matter disease likely reflects the sequela of chronic microvascular ischemia. 3. Minimal right sphenoid sinus disease. 4. Small mastoid effusions. No obstructing nasopharyngeal lesion is evident.   Electronically Signed   By: Gennette Pac M.D.   On: 02/04/2014 08:51   Dg Chest Port 1 View  01/11/2014   CLINICAL DATA:  Shortness of Breath  EXAM: PORTABLE CHEST - 1 VIEW  COMPARISON:  January 05, 2014  FINDINGS: There is no edema or consolidation. Heart is enlarged with mild pulmonary venous hypertension. No adenopathy. No bone lesions.  IMPRESSION: Evidence of volume overload with cardiomegaly and pulmonary venous hypertension. No overt edema or  consolidation, however. No appreciable effusions.   Electronically Signed   By: Bretta Bang M.D.   On: 01/11/2014 21:58    Tashiya Souders Levora Dredge, MD  [facility] Pager:[phone]  If 7PM-7AM, please contact night-coverage [email or web address] Password [facility] 02/08/2014, 8:54 AM   LOS: 5 days

## 2014-02-08 NOTE — Discharge Summary (Signed)
PATIENT DETAILS Name: [name] Age: 80 y.o. Sex: female Date of Birth: 1924-10-17 MRN: [id number]. Admit Date: 02/03/2014 Admitting Physician: Doree Albee, MD WUJ:[id number],NFA Maisie Fus, MD  Recommendations for Outpatient Follow-up:  1. Please recheck electrolytes at next visit with either PCP or Cardiology 2. Please repeat Chest Xray in 6-8 weeks to document resolution of PNA  PRIMARY DISCHARGE DIAGNOSIS:  Active Problems:   Aortic stenosis, moderate   Hyponatremia   AKI (acute kidney injury)   Acute on chronic diastolic heart failure   HCAP      PAST MEDICAL HISTORY: Past Medical History  Diagnosis Date  . Hypertension   . Peripheral neuropathy   . Asthma   . High cholesterol   . Diabetes with neurologic complications   . PAD (peripheral artery disease)   . Enlarged heart   . Anginal pain 05/28/12  . Pneumonia 11/2010  . Sleep apnea     cpap  . Hypothyroidism   . Headache(784.0) 05/29/12    "recently"  . Stroke 05/2005; 08/2010    !mini"  . Personal history of gout   . Hypothyroidism   . DJD (degenerative joint disease)   . Carpal tunnel syndrome, bilateral   . Recurrent labyrinthitis   . GERD (gastroesophageal reflux disease)     S/P esophageal dilation in 1996  . Gallstone     Silent  . Fibrocystic breast changes   . Hemorrhoids     with bleeding  . Hx of adenomatous colonic polyps     Dr Sherin Quarry  . Diverticulitis     left side  . MR (mitral regurgitation)     moderate  . Mild anemia     hemoglobin 11.8 on 06/2009  . Edema of lower extremity     Chronic  . Ischemic colitis 8/13  . Aortic stenosis, moderate   . Chronic diastolic CHF (congestive heart failure)   . Moderate mitral regurgitation   . CKD (chronic kidney disease) stage 3, GFR 30-59 ml/min     "something on labs always say she has some problems"  . Chronic atrial fibrillation     DISCHARGE MEDICATIONS:   Medication List    STOP taking these medications       amiodarone  200 MG tablet  Commonly known as:  PACERONE     dabigatran 75 MG Caps capsule  Commonly known as:  PRADAXA      TAKE these medications       acetaminophen 500 MG tablet  Commonly known as:  TYLENOL  Take 500 mg by mouth every 6 (six) hours as needed for pain.     allopurinol 300 MG tablet  Commonly known as:  ZYLOPRIM  Take 1 tablet (300 mg total) by mouth daily.     apixaban 2.5 MG Tabs tablet  Commonly known as:  ELIQUIS  Take 1 tablet (2.5 mg total) by mouth 2 (two) times daily.     atorvastatin 10 MG tablet  Commonly known as:  LIPITOR  Take 1 tablet (10 mg total) by mouth daily.     Calcium-Vitamin D 600-200 MG-UNIT Caps  Take 1 tablet by mouth daily.     Fish Oil 1000 MG Caps  Take 1,000 mg by mouth daily.     furosemide 40 MG tablet  Commonly known as:  LASIX  Take 1 tablet (40 mg total) by mouth 2 (two) times daily.     gabapentin 300 MG capsule  Commonly known as:  NEURONTIN  Take 1 capsule (  300 mg total) by mouth at bedtime.     levofloxacin 750 MG tablet  Commonly known as:  LEVAQUIN  Take 1 tablet (750 mg total) by mouth every other day. STARTING ON 4/13-FOR ONE DOSE AND THEN STOP     levothyroxine 100 MCG tablet  Commonly known as:  SYNTHROID, LEVOTHROID  Take 100 mcg by mouth daily before breakfast.     metoprolol succinate 25 MG 24 hr tablet  Commonly known as:  TOPROL-XL  Take 0.5 tablets (12.5 mg total) by mouth daily.     multivitamin with minerals tablet  Take 1 tablet by mouth daily.     oxybutynin 5 MG tablet  Commonly known as:  DITROPAN  Take 2 tablets (10 mg total) by mouth at bedtime.     polycarbophil 625 MG tablet  Commonly known as:  FIBERCON  Take 625 mg by mouth 2 (two) times daily.     potassium chloride 10 MEQ tablet  Commonly known as:  K-DUR,KLOR-CON  Take 1 tablet (10 mEq total) by mouth 2 (two) times daily.     vitamin C 500 MG tablet  Commonly known as:  ASCORBIC ACID  Take 500 mg by mouth daily.     zaleplon 5  MG capsule  Commonly known as:  SONATA  Take 2 capsules (10 mg total) by mouth at bedtime.        ALLERGIES:   Allergies  Allergen Reactions  . Aspirin Other (See Comments)    Tears my stomach up; "can take coated Aspirin qd without problems"  . Ambien [Zolpidem Tartrate]     Pt does not [place] from [place]  . Nexium [Esomeprazole Magnesium] Rash  . Penicillins Rash    Tolerates Rocephin    BRIEF HPI:  See H&P, Labs, Consult and Test reports for all details in brief, This is a [age] y.o. year old female with prior hx/o HTN, DM, afib, diastolic CHF, moderate AS, MR, stage 3 CKD presenting with weakness, dyspnea, found to have LLL PNA, mild CHF exacerbation.   CONSULTATIONS:   cardiology  PERTINENT RADIOLOGIC STUDIES: Dg Chest 2 View  02/03/2014   CLINICAL DATA:  Multiple falls, with generalized weakness.  EXAM: CHEST  2 VIEW  COMPARISON:  Chest radiograph performed 01/23/2014  FINDINGS: The lungs are well-aerated. Vascular congestion is noted, with mildly increased interstitial markings. Left basilar airspace opacification is noted, with a persistent small left pleural effusion, slightly improved from the prior study. No pneumothorax is seen.  The heart is mildly enlarged. Calcification is noted within the aortic arch. No acute osseous abnormalities are seen.  IMPRESSION: Vascular congestion and mild cardiomegaly, with mildly increased interstitial markings. Left basilar airspace opacity noted, with a residual small left pleural effusion. This may reflect mild persistent pulmonary edema or possibly pneumonia. No displaced rib fracture seen.   Electronically Signed   By: Roanna RaiderJeffery  Chang M.D.   On: 02/03/2014 22:11   Dg Chest 2 View  01/23/2014   CLINICAL DATA:  Chronic congestive heart failure now with shortness of breath.  EXAM: CHEST  2 VIEW  COMPARISON:  DG CHEST 1V PORT dated 01/11/2014  FINDINGS: The lungs are mildly hypoinflated. The pulmonary interstitial markings are increased. The  pulmonary vascularity is engorged and indistinct. The cardiopericardial silhouette is enlarged. The left hemidiaphragm is largely obscured. The observed portions of the bony thorax exhibit no acute abnormalities.  IMPRESSION: The findings are consistent with congestive heart failure with pulmonary interstitial edema and a moderate size left pleural  effusion. There has been deterioration in the appearance of the chest since the previous study.   Electronically Signed   By: David  [place]   On: 01/23/2014 10:07   Mr Brain Wo Contrast  02/04/2014   CLINICAL DATA:  Bilateral weakness, worse on the left. History of CVA.  EXAM: MRI HEAD WITHOUT CONTRAST  TECHNIQUE: Multiplanar, multiecho pulse sequences of the brain and surrounding structures were obtained without intravenous contrast.  COMPARISON:  MRI brain 09/05/2010  FINDINGS: Moderate generalized atrophy and white matter disease has progressed since prior exam. No acute infarct, hemorrhage, or mass lesion is present. And the ventricles are proportionate to the degree of atrophy. No significant extraaxial fluid collection is present.  Flow is present in the major intracranial arteries. Remote lacunar infarcts of the basal ganglia and cerebellum are stable.  Patient is status post bilateral lens replacements. Mild mucosal thickening is present in the right sphenoid sinus. The remaining paranasal sinuses are clear. There is minimal fluid in the mastoid air cells. No obstructing nasopharyngeal lesion is evident.  IMPRESSION: 1. No acute intracranial abnormality. 2. Progressive atrophy and white matter disease likely reflects the sequela of chronic microvascular ischemia. 3. Minimal right sphenoid sinus disease. 4. Small mastoid effusions. No obstructing nasopharyngeal lesion is evident.   Electronically Signed   By: Gennette Pac M.D.   On: 02/04/2014 08:51   Dg Chest Port 1 View  01/11/2014   CLINICAL DATA:  Shortness of Breath  EXAM: PORTABLE CHEST - 1 VIEW   COMPARISON:  January 05, 2014  FINDINGS: There is no edema or consolidation. Heart is enlarged with mild pulmonary venous hypertension. No adenopathy. No bone lesions.  IMPRESSION: Evidence of volume overload with cardiomegaly and pulmonary venous hypertension. No overt edema or consolidation, however. No appreciable effusions.   Electronically Signed   By: Bretta Bang M.D.   On: 01/11/2014 21:58     PERTINENT LAB RESULTS: CBC:  Recent Labs  02/07/14 0525 02/08/14 0612  WBC 10.8* 11.5*  HGB 12.6 13.1  HCT 37.9 39.7  PLT 214 210   CMET CMP     Component Value Date/Time   NA 133* 02/08/2014 0612   K 3.8 02/08/2014 0612   CL 87* 02/08/2014 0612   CO2 33* 02/08/2014 0612   GLUCOSE 93 02/08/2014 0612   BUN 32* 02/08/2014 0612   CREATININE 1.28* 02/08/2014 0612   CALCIUM 9.4 02/08/2014 0612   PROT 6.7 02/07/2014 0525   ALBUMIN 2.6* 02/07/2014 0525   AST 92* 02/07/2014 0525   ALT 42* 02/07/2014 0525   ALKPHOS 114 02/07/2014 0525   BILITOT 0.8 02/07/2014 0525   GFRNONAA 36* 02/08/2014 0612   GFRAA 42* 02/08/2014 0612    GFR Estimated Creatinine Clearance: 30.1 ml/min (by C-G formula based on Cr of 1.28). No results found for this basename: LIPASE, AMYLASE,  in the last 72 hours No results found for this basename: CKTOTAL, CKMB, CKMBINDEX, TROPONINI,  in the last 72 hours No components found with this basename: POCBNP,  No results found for this basename: DDIMER,  in the last 72 hours No results found for this basename: HGBA1C,  in the last 72 hours No results found for this basename: CHOL, HDL, LDLCALC, TRIG, CHOLHDL, LDLDIRECT,  in the last 72 hours No results found for this basename: TSH, T4TOTAL, FREET3, T3FREE, THYROIDAB,  in the last 72 hours No results found for this basename: VITAMINB12, FOLATE, FERRITIN, TIBC, IRON, RETICCTPCT,  in the last 72 hours Coags: No results found  for this basename: PT, INR,  in the last 72 hours Microbiology: Recent Results (from the past 240 hour(s))    CULTURE, BLOOD (ROUTINE X 2)     Status: None   Collection Time    02/03/14 10:16 PM      Result Value Ref Range Status   Specimen Description BLOOD RIGHT HAND   Final   Special Requests BOTTLES DRAWN AEROBIC ONLY 5CC   Final   Culture  Setup Time     Final   Value: 02/04/2014 08:19     Performed at [facility]   Culture     Final   Value:        BLOOD CULTURE RECEIVED NO GROWTH TO DATE CULTURE WILL BE HELD FOR 5 DAYS BEFORE ISSUING A FINAL NEGATIVE REPORT     Performed at [facility]   Report Status PENDING   Incomplete  CULTURE, BLOOD (ROUTINE X 2)     Status: None   Collection Time    02/03/14 11:59 PM      Result Value Ref Range Status   Specimen Description BLOOD LEFT HAND   Final   Special Requests     Final   Value: BOTTLES DRAWN AEROBIC AND ANAEROBIC 10CC BLUE,3CC RED   Culture  Setup Time     Final   Value: 02/04/2014 08:19     Performed at [facility]   Culture     Final   Value:        BLOOD CULTURE RECEIVED NO GROWTH TO DATE CULTURE WILL BE HELD FOR 5 DAYS BEFORE ISSUING A FINAL NEGATIVE REPORT     Performed at [facility]   Report Status PENDING   Incomplete     BRIEF HOSPITAL COURSE:  Acute on chronic Diastolic CHF  -admitted and started on IV lasix gtt, improved significantly, Cards consulted, Lasix now changed to oral. Weight decreased to 148 lbs from 156 pounds on admission.Also more than 10 litre's neg balance. Cardiology recommending lasix 40 mg BID on discharge. Further optimization of her diuretic and CHF regimen will be done in the outpatient setting at the CHF clinic.  Acute Respiratory Failure-Hypoxic  -suspect multifactorial-CHF/Deconditioning/Frailty etc. Desaturates on ambulation  -since CHF improving, may need a short duration of Home O2, will ask case management to arrange  Atrial Fibrillation  -chronic  -Cards initially recommended to decrease Amiodarone to 100 mg, however eventually discontinued, also  switched from Pradaxa to Eliquis. This was d/w patient and family (daughter over the phone)-agreeable.  HCAP  -afebrile and without leukocytosis  -completed 4 days of Azactam,Vanco-will transition to Levaquin-to complete a total of 7 days of treatment  -blood culture on 4/7 neg so far   Falls/Weakness  -MRI brain neg for acute abnormalities  -PT recommending HHPT on d/c. Patient claims to have 24/7 care at home. -suspect secondary to CHF/Deconditioning/acute illness/advanced age  -case management to arrange HHPT/OT/Aide/RN/social work on discharge  Aortic Stenosis/Mitral Regurgitation/Tricuspide Regurgitation  -per cards-needs outpatient monitoring   Hyponatremia  -mild hyponatremia persists, suspect this is from CHF-continue to monitor in the outpatient setting  Hypothyroidism  -c/w Levothyroxine   DM  -c/w SSI, A1C at 6.2-would monitor off medications, diet control/life style modifications  OSA  -c/w CPAP qhs on discharge  TODAY-DAY OF DISCHARGE:  Subjective:   Dorthula Charlet today has no headache,no chest abdominal pain,no new weakness tingling or numbness, feels much better wants to go home today.   Objective:   Blood pressure 117/70,  pulse 62, temperature 97.9 F (36.6 C), temperature source Oral, resp. rate 15, height 5\' 7"  (1.702 m), weight 67.314 kg (148 lb 6.4 oz), SpO2 95.00%.  Intake/Output Summary (Last 24 hours) at 02/08/14 1332 Last data filed at 02/07/14 2142  Gross per 24 hour  Intake    202 ml  Output    500 ml  Net   -298 ml   Filed Weights   02/06/14 0900 02/07/14 0549 02/08/14 0535  Weight: 70.308 kg (155 lb) 66.951 kg (147 lb 9.6 oz) 67.314 kg (148 lb 6.4 oz)    Exam Awake Alert, Oriented *3, No new F.N deficits, Normal affect .AT,PERRAL Supple Neck,No JVD, No cervical lymphadenopathy appriciated.  Symmetrical Chest wall movement, Good air movement bilaterally, CTAB RRR,No Gallops,Rubs or new Murmurs, No Parasternal Heave +ve B.Sounds,  Abd Soft, Non tender, No organomegaly appriciated, No rebound -guarding or rigidity. No Cyanosis, Clubbing or edema, No new Rash or bruise  DISCHARGE CONDITION: Stable  DISPOSITION: Home with home health services  DISCHARGE INSTRUCTIONS:    Activity:  As tolerated with Full fall precautions use walker/cane & assistance as needed  Diet recommendation: Diabetic Diet Heart Healthy diet   Discharge Orders   Future Appointments Provider Department Dept Phone   04/09/2014 2:30 PM Marlowe Aschoff, DPM [facility] [phone]   05/18/2014 2:30 PM Ronal Fear, NP [facility] [phone]   Future Orders Complete By Expires   (HEART FAILURE PATIENTS) Call MD:  Anytime you have any of the following symptoms: 1) 3 pound weight gain in 24 hours or 5 pounds in 1 week 2) shortness of breath, with or without a dry hacking cough 3) swelling in the hands, feet or stomach 4) if you have to sleep on extra pillows at night in order to breathe.  As directed    Diet - low sodium heart healthy  As directed    Increase activity slowly  As directed       Follow-up Information   Follow up with [facility]. (home health physical and occupational therapy, nurse, social worker and aide)    Specialty:  Home Health Services   Contact information:   [address] [place] [address] [phone]       Follow up with [facility]. Schedule an appointment as soon as possible for a visit in 1 week.   Contact information:   [address] [place] [address]       Follow up with Ginette Otto, MD. Schedule an appointment as soon as possible for a visit in 1 week.   Specialty:  Internal Medicine   Contact information:   [address] [place] [address] [phone]       Total Time spent on discharge equals 45 minutes.  Signed: Werner Lean Natavia Sublette 02/08/2014 1:32 PM

## 2014-02-08 NOTE — Clinical Social Work Note (Signed)
CSW informed by RN of patient d/c home and need for transportation assistance. CSW met with patient and family and verified address. CSW arranged transportation via [facility] ([facility]). CSW placed d/c packet in patient's shadow chart. RN informed.No further needs identified at present time. CSW signing off.  [place], [facility] Clinical Social Worker [phone]

## 2014-02-10 ENCOUNTER — Emergency Department (HOSPITAL_COMMUNITY): Payer: Medicare Other

## 2014-02-10 ENCOUNTER — Encounter (HOSPITAL_COMMUNITY): Payer: Self-pay | Admitting: Emergency Medicine

## 2014-02-10 ENCOUNTER — Emergency Department (HOSPITAL_COMMUNITY)
Admission: EM | Admit: 2014-02-10 | Discharge: 2014-02-10 | Disposition: A | Discharge status: Home / Self Care | Payer: Medicare Other | Attending: Emergency Medicine | Admitting: Emergency Medicine

## 2014-02-10 DIAGNOSIS — Z79899 Other long term (current) drug therapy: Secondary | ICD-10-CM | POA: Insufficient documentation

## 2014-02-10 DIAGNOSIS — Z792 Long term (current) use of antibiotics: Secondary | ICD-10-CM | POA: Insufficient documentation

## 2014-02-10 DIAGNOSIS — N183 Chronic kidney disease, stage 3 unspecified: Secondary | ICD-10-CM | POA: Insufficient documentation

## 2014-02-10 DIAGNOSIS — M109 Gout, unspecified: Secondary | ICD-10-CM | POA: Insufficient documentation

## 2014-02-10 DIAGNOSIS — Z862 Personal history of diseases of the blood and blood-forming organs and certain disorders involving the immune mechanism: Secondary | ICD-10-CM | POA: Insufficient documentation

## 2014-02-10 DIAGNOSIS — E7800 Pure hypercholesterolemia, unspecified: Secondary | ICD-10-CM | POA: Insufficient documentation

## 2014-02-10 DIAGNOSIS — W19XXXA Unspecified fall, initial encounter: Secondary | ICD-10-CM

## 2014-02-10 DIAGNOSIS — Z8739 Personal history of other diseases of the musculoskeletal system and connective tissue: Secondary | ICD-10-CM | POA: Insufficient documentation

## 2014-02-10 DIAGNOSIS — S79929A Unspecified injury of unspecified thigh, initial encounter: Principal | ICD-10-CM

## 2014-02-10 DIAGNOSIS — S79919A Unspecified injury of unspecified hip, initial encounter: Secondary | ICD-10-CM | POA: Insufficient documentation

## 2014-02-10 DIAGNOSIS — R296 Repeated falls: Secondary | ICD-10-CM | POA: Insufficient documentation

## 2014-02-10 DIAGNOSIS — Y92009 Unspecified place in unspecified non-institutional (private) residence as the place of occurrence of the external cause: Secondary | ICD-10-CM | POA: Insufficient documentation

## 2014-02-10 DIAGNOSIS — G4730 Sleep apnea, unspecified: Secondary | ICD-10-CM | POA: Insufficient documentation

## 2014-02-10 DIAGNOSIS — Z860100 Personal history of colon polyps, unspecified: Secondary | ICD-10-CM | POA: Insufficient documentation

## 2014-02-10 DIAGNOSIS — Z8742 Personal history of other diseases of the female genital tract: Secondary | ICD-10-CM | POA: Insufficient documentation

## 2014-02-10 DIAGNOSIS — E039 Hypothyroidism, unspecified: Secondary | ICD-10-CM | POA: Insufficient documentation

## 2014-02-10 DIAGNOSIS — I4891 Unspecified atrial fibrillation: Secondary | ICD-10-CM | POA: Insufficient documentation

## 2014-02-10 DIAGNOSIS — Z8719 Personal history of other diseases of the digestive system: Secondary | ICD-10-CM | POA: Insufficient documentation

## 2014-02-10 DIAGNOSIS — Z8673 Personal history of transient ischemic attack (TIA), and cerebral infarction without residual deficits: Secondary | ICD-10-CM | POA: Insufficient documentation

## 2014-02-10 DIAGNOSIS — Z8701 Personal history of pneumonia (recurrent): Secondary | ICD-10-CM | POA: Insufficient documentation

## 2014-02-10 DIAGNOSIS — J45909 Unspecified asthma, uncomplicated: Secondary | ICD-10-CM | POA: Insufficient documentation

## 2014-02-10 DIAGNOSIS — Z9981 Dependence on supplemental oxygen: Secondary | ICD-10-CM | POA: Insufficient documentation

## 2014-02-10 DIAGNOSIS — I129 Hypertensive chronic kidney disease with stage 1 through stage 4 chronic kidney disease, or unspecified chronic kidney disease: Secondary | ICD-10-CM | POA: Insufficient documentation

## 2014-02-10 DIAGNOSIS — E1149 Type 2 diabetes mellitus with other diabetic neurological complication: Secondary | ICD-10-CM | POA: Insufficient documentation

## 2014-02-10 DIAGNOSIS — Z880 Allergy status to penicillin: Secondary | ICD-10-CM | POA: Insufficient documentation

## 2014-02-10 DIAGNOSIS — Z7901 Long term (current) use of anticoagulants: Secondary | ICD-10-CM | POA: Insufficient documentation

## 2014-02-10 DIAGNOSIS — E1142 Type 2 diabetes mellitus with diabetic polyneuropathy: Secondary | ICD-10-CM | POA: Insufficient documentation

## 2014-02-10 DIAGNOSIS — Z87440 Personal history of urinary (tract) infections: Secondary | ICD-10-CM | POA: Insufficient documentation

## 2014-02-10 DIAGNOSIS — I5032 Chronic diastolic (congestive) heart failure: Secondary | ICD-10-CM | POA: Insufficient documentation

## 2014-02-10 DIAGNOSIS — Y9301 Activity, walking, marching and hiking: Secondary | ICD-10-CM | POA: Insufficient documentation

## 2014-02-10 LAB — CBC
HCT: 38.1 % (ref 36.0–46.0)
HEMOGLOBIN: 13 g/dL (ref 12.0–15.0)
MCH: 33.1 pg (ref 26.0–34.0)
MCHC: 34.1 g/dL (ref 30.0–36.0)
MCV: 96.9 fL (ref 78.0–100.0)
PLATELETS: 231 10*3/uL (ref 150–400)
RBC: 3.93 MIL/uL (ref 3.87–5.11)
RDW: 16.4 % — ABNORMAL HIGH (ref 11.5–15.5)
WBC: 13.3 10*3/uL — AB (ref 4.0–10.5)

## 2014-02-10 LAB — CULTURE, BLOOD (ROUTINE X 2)
CULTURE: NO GROWTH
Culture: NO GROWTH

## 2014-02-10 LAB — URINALYSIS, ROUTINE W REFLEX MICROSCOPIC
Bilirubin Urine: NEGATIVE
GLUCOSE, UA: NEGATIVE mg/dL
Hgb urine dipstick: NEGATIVE
Ketones, ur: NEGATIVE mg/dL
LEUKOCYTES UA: NEGATIVE
NITRITE: NEGATIVE
PH: 7 (ref 5.0–8.0)
Protein, ur: NEGATIVE mg/dL
SPECIFIC GRAVITY, URINE: 1.013 (ref 1.005–1.030)
Urobilinogen, UA: 0.2 mg/dL (ref 0.0–1.0)

## 2014-02-10 LAB — BASIC METABOLIC PANEL
BUN: 51 mg/dL — ABNORMAL HIGH (ref 6–23)
CALCIUM: 9.6 mg/dL (ref 8.4–10.5)
CO2: 31 mEq/L (ref 19–32)
Chloride: 86 mEq/L — ABNORMAL LOW (ref 96–112)
Creatinine, Ser: 1.5 mg/dL — ABNORMAL HIGH (ref 0.50–1.10)
GFR calc Af Amer: 35 mL/min — ABNORMAL LOW (ref >90)
GFR, EST NON AFRICAN AMERICAN: 30 mL/min — AB (ref >90)
Glucose, Bld: 111 mg/dL — ABNORMAL HIGH (ref 70–99)
POTASSIUM: 3.8 meq/L (ref 3.7–5.3)
Sodium: 133 mEq/L — ABNORMAL LOW (ref 137–147)

## 2014-02-10 MED ORDER — OXYCODONE-ACETAMINOPHEN 5-325 MG PO TABS
1.0000 | ORAL_TABLET | Freq: Once | ORAL | Status: AC
  Administered 2014-02-10: 1 via ORAL
  Filled 2014-02-10: qty 1

## 2014-02-10 MED ORDER — SODIUM CHLORIDE 0.9 % IV BOLUS (SEPSIS): 500.0000 mL | Freq: Once | INTRAVENOUS | Status: DC

## 2014-02-10 NOTE — ED Notes (Signed)
Attempted to obtain IV x 2 nurses. Phlebotomy came to room and pt in xray. Will call phlebotomy when pt returns

## 2014-02-10 NOTE — ED Notes (Signed)
Pt at home- coming from bathroom with assistance from caregiver. Pt's legs gave out- no LOC, did not hit head. Complains of left hip pain and right shoulder pain and tailbone pain. No obvious deformities. BP 108/62, HR 88, O2 sats 95% 3L [place]. Pt wears O2 off and on at home as needed. Pt is A/O, pt has 24 hr home care, pt ambulates with walker.

## 2014-02-10 NOTE — ED Notes (Signed)
Placed pt on bedpan. Also gave pt Malaw[place]urkey sandwich and orange juice

## 2014-02-10 NOTE — Progress Notes (Signed)
CM requested to follow up consult with patient's children, since son Dorinda HillDonald arrived. Discussed the role of [facility] and explained that they can assist with SNF placement and that [facility] Corrie DandyMary is aware of the request for SNF placement and stated that they can assist with this process. Family also discussed the recommendation by EMT for DNR status. Encouraged family to [facility], Dr.[facility], and the patient. Family was appreciative of information provided. EDP was made aware of family plan upon discharge.

## 2014-02-10 NOTE — Progress Notes (Signed)
CSW consult to pt regarding information on SNF placement. CSW spoke with pt/family in regards to placement for rehabilitation. Family shared that [facility] and shared that they will work with them in order to pursue services. CSW gave pt/family a list if facilities in the [place] area. No further CSW intervention needed.   [address]Doris Leiland [name], Connecticut[place]CSWA [phone]

## 2014-02-10 NOTE — ED Notes (Signed)
Spoke with MD about pt having hard time with IV's- MD ok with trying PO hydration. Gave pt two cups of water. Encouraged urination.

## 2014-02-10 NOTE — ED Notes (Signed)
No obvious bruising noted. Pt able to move limbs with minimal to no pain

## 2014-02-10 NOTE — Discharge Instructions (Signed)
Please follow-up with Dr. Pete GlatterStoneking as soon as possible.  I called and left a message with his assistant so she will hopefully be calling this afternoon to set up the appointment time.  Case management has been in touch with the home health agency regarding home physical therapy.     Fall Prevention and Home Safety Falls cause injuries and can affect all age groups. It is possible to use preventive measures to significantly decrease the likelihood of falls. There are many simple measures which can make your home safer and prevent falls. OUTDOORS  Repair cracks and edges of walkways and driveways.  Remove high doorway thresholds.  Trim shrubbery on the main path into your home.  Have good outside lighting.  Clear walkways of tools, rocks, debris, and clutter.  Check that handrails are not broken and are securely fastened. Both sides of steps should have handrails.  Have leaves, snow, and ice cleared regularly.  Use sand or salt on walkways during winter months.  In the garage, clean up grease or oil spills. BATHROOM  Install night lights.  Install grab bars by the toilet and in the tub and shower.  Use non-skid mats or decals in the tub or shower.  Place a plastic non-slip stool in the shower to sit on, if needed.  Keep floors dry and clean up all water on the floor immediately.  Remove soap buildup in the tub or shower on a regular basis.  Secure bath mats with non-slip, double-sided rug tape.  Remove throw rugs and tripping hazards from the floors. BEDROOMS  Install night lights.  Make sure a bedside light is easy to reach.  Do not use oversized bedding.  Keep a telephone by your bedside.  Have a firm chair with side arms to use for getting dressed.  Remove throw rugs and tripping hazards from the floor. KITCHEN  Keep handles on pots and pans turned toward the center of the stove. Use back burners when possible.  Clean up spills quickly and allow time for  drying.  Avoid walking on wet floors.  Avoid hot utensils and knives.  Position shelves so they are not too high or low.  Place commonly used objects within easy reach.  If necessary, use a sturdy step stool with a grab bar when reaching.  Keep electrical cables out of the way.  Do not use floor polish or wax that makes floors slippery. If you must use wax, use non-skid floor wax.  Remove throw rugs and tripping hazards from the floor. STAIRWAYS  Never leave objects on stairs.  Place handrails on both sides of stairways and use them. Fix any loose handrails. Make sure handrails on both sides of the stairways are as long as the stairs.  Check carpeting to make sure it is firmly attached along stairs. Make repairs to worn or loose carpet promptly.  Avoid placing throw rugs at the top or bottom of stairways, or properly secure the rug with carpet tape to prevent slippage. Get rid of throw rugs, if possible.  Have an electrician put in a light switch at the top and bottom of the stairs. OTHER FALL PREVENTION TIPS  Wear low-heel or rubber-soled shoes that are supportive and fit well. Wear closed toe shoes.  When using a stepladder, make sure it is fully opened and both spreaders are firmly locked. Do not climb a closed stepladder.  Add color or contrast paint or tape to grab bars and handrails in your home. Place contrasting color  strips on first and last steps.  Learn and use mobility aids as needed. Install an electrical emergency response system.  Turn on lights to avoid dark areas. Replace light bulbs that burn out immediately. Get light switches that glow.  Arrange furniture to create clear pathways. Keep furniture in the same place.  Firmly attach carpet with non-skid or double-sided tape.  Eliminate uneven floor surfaces.  Select a carpet pattern that does not visually hide the edge of steps.  Be aware of all pets. OTHER HOME SAFETY TIPS  Set the water temperature  for 120 F (48.8 C).  Keep emergency numbers on or near the telephone.  Keep smoke detectors on every level of the home and near sleeping areas. Document Released: 10/06/2002 Document Revised: 04/16/2012 Document Reviewed: 01/05/2012 [facility] Patient Information 2014 [place], [place].

## 2014-02-10 NOTE — ED Provider Notes (Signed)
CSN: [id number]     Arrival date & time 02/10/14  1015 History   First MD Initiated Contact with Patient 02/10/14 1023     Chief Complaint  Patient presents with  . Fall     (Consider location/radiation/quality/duration/timing/severity/associated sxs/prior Treatment) HPI 80 year old woman with history of chronic a fib on Eliquis, HTN, DM2, HL, dCHF, CKD3, hypothyroidism who presents after mechanical fall.   Patient states she has been doing well since hospital discharge (for UTI) on 4/12, was walking to her den this morning with caregiver using walker when her knees gave out.  Caregiver was able to ease her to the ground, but she still landed on her right hip and bottom which are still somewhat sore. She did not lose consciousness or hit her head.  Denies chest pain, shortness of breath, nausea/vomiting, diarrhea.  She completed her antibiotic course yesterday.    Past Medical History  Diagnosis Date  . Hypertension   . Peripheral neuropathy   . Asthma   . High cholesterol   . Diabetes with neurologic complications   . PAD (peripheral artery disease)   . Enlarged heart   . Anginal pain 05/28/12  . Pneumonia 11/2010  . Sleep apnea     cpap  . Hypothyroidism   . Headache(784.0) 05/29/12    "recently"  . Stroke 05/2005; 08/2010    !mini"  . Personal history of gout   . Hypothyroidism   . DJD (degenerative joint disease)   . Carpal tunnel syndrome, bilateral   . Recurrent labyrinthitis   . GERD (gastroesophageal reflux disease)     S/P esophageal dilation in 1996  . Gallstone     Silent  . Fibrocystic breast changes   . Hemorrhoids     with bleeding  . Hx of adenomatous colonic polyps     Dr Sherin QuarryWeissman  . Diverticulitis     left side  . MR (mitral regurgitation)     moderate  . Mild anemia     hemoglobin 11.8 on 06/2009  . Edema of lower extremity     Chronic  . Ischemic colitis 8/13  . Aortic stenosis, moderate   . Chronic diastolic CHF (congestive heart failure)   .  Moderate mitral regurgitation   . CKD (chronic kidney disease) stage 3, GFR 30-59 ml/min     "something on labs always say she has some problems"  . Chronic atrial fibrillation    Past Surgical History  Procedure Laterality Date  . Tonsillectomy and adenoidectomy  1960's  . Dilation and curettage of uterus    . Vaginal hysterectomy  1970's  . Total knee arthroplasty  1970-~2002    left; right  . Cataract extraction w/ intraocular lens  implant, bilateral  ?1970's   Family History  Problem Relation Age of Onset  . Heart disease Mother   . Heart disease Father   . Heart disease Brother    History  Substance Use Topics  . Smoking status: Never Smoker   . Smokeless tobacco: Never Used  . Alcohol Use: No   OB History   Grav Para Term Preterm Abortions TAB SAB Ect Mult Living                 Review of Systems  Constitutional: Negative for fever, activity change and appetite change.  Respiratory: Negative for cough and shortness of breath.   Cardiovascular: Negative for chest pain.  Gastrointestinal: Negative for nausea, diarrhea, constipation and blood in stool.  Genitourinary: Negative for  dysuria, frequency and hematuria.       Intermittently incontinent to urine at baseline, wears Depends  Musculoskeletal: Negative for back pain.  Neurological: Negative for syncope, weakness, numbness and headaches.     Allergies  Aspirin; Ambien; Nexium; and Penicillins  Home Medications   Prior to Admission medications   Medication Sig Start Date End Date Taking? Authorizing Provider  acetaminophen (TYLENOL) 500 MG tablet Take 500 mg by mouth every 6 (six) hours as needed for pain.    Historical Provider, MD  allopurinol (ZYLOPRIM) 300 MG tablet Take 1 tablet (300 mg total) by mouth daily. 01/30/14   Mcarthur Rossettianiel J Angiulli, PA-C  apixaban (ELIQUIS) 2.5 MG TABS tablet Take 1 tablet (2.5 mg total) by mouth 2 (two) times daily. 02/08/14   Shanker Levora DredgeM Ghimire, MD  atorvastatin (LIPITOR) 10 MG  tablet Take 1 tablet (10 mg total) by mouth daily. 01/30/14   Mcarthur Rossettianiel J Angiulli, PA-C  Calcium Carbonate-Vitamin D (CALCIUM-VITAMIN D) 600-200 MG-UNIT CAPS Take 1 tablet by mouth daily.     Historical Provider, MD  furosemide (LASIX) 40 MG tablet Take 1 tablet (40 mg total) by mouth 2 (two) times daily. 02/08/14   Shanker Levora DredgeM Ghimire, MD  gabapentin (NEURONTIN) 300 MG capsule Take 1 capsule (300 mg total) by mouth at bedtime. 01/30/14   Mcarthur Rossettianiel J Angiulli, PA-C  levofloxacin (LEVAQUIN) 750 MG tablet Take 1 tablet (750 mg total) by mouth every other day. STARTING ON 4/13-FOR ONE DOSE AND THEN STOP 02/08/14   Shanker Levora DredgeM Ghimire, MD  levothyroxine (SYNTHROID, LEVOTHROID) 100 MCG tablet Take 100 mcg by mouth daily before breakfast.    Historical Provider, MD  metoprolol succinate (TOPROL-XL) 25 MG 24 hr tablet Take 0.5 tablets (12.5 mg total) by mouth daily. 01/30/14   Mcarthur Rossettianiel J Angiulli, PA-C  Multiple Vitamins-Minerals (MULTIVITAMIN WITH MINERALS) tablet Take 1 tablet by mouth daily.    Historical Provider, MD  Omega-3 Fatty Acids (FISH OIL) 1000 MG CAPS Take 1,000 mg by mouth daily.     Historical Provider, MD  oxybutynin (DITROPAN) 5 MG tablet Take 2 tablets (10 mg total) by mouth at bedtime. 01/30/14   Mcarthur Rossettianiel J Angiulli, PA-C  polycarbophil (FIBERCON) 625 MG tablet Take 625 mg by mouth 2 (two) times daily.    Historical Provider, MD  potassium chloride (K-DUR,KLOR-CON) 10 MEQ tablet Take 1 tablet (10 mEq total) by mouth 2 (two) times daily. 01/30/14   Mcarthur Rossettianiel J Angiulli, PA-C  vitamin C (ASCORBIC ACID) 500 MG tablet Take 500 mg by mouth daily.    Historical Provider, MD  zaleplon (SONATA) 5 MG capsule Take 2 capsules (10 mg total) by mouth at bedtime. 01/30/14   Mcarthur Rossettianiel J Angiulli, PA-C   BP 99/55  Pulse 73  Resp 17  Ht 5\' 7"  (1.702 m)  Wt 141 lb (63.957 kg)  BMI 22.08 kg/m2  SpO2 91% Physical Exam  Constitutional: She is oriented to person, place, and time. She appears well-developed and well-nourished.  HENT:   Head: Normocephalic and atraumatic.  Eyes: Conjunctivae are normal. Pupils are equal, round, and reactive to light.  Neck: Normal range of motion. Neck supple.  Cardiovascular: Normal rate and regular rhythm.   Pulmonary/Chest: Effort normal and breath sounds normal.  Abdominal: Soft. Bowel sounds are normal.  Musculoskeletal: Normal range of motion.  Neurological: She is alert and oriented to person, place, and time. No cranial nerve deficit.  Strength and sensation intact throughout  Skin: Skin is warm and dry.    ED Course  Procedures (including critical care time) Labs Review Labs Reviewed  BASIC METABOLIC PANEL - Abnormal; Notable for the following:    Sodium 133 (*)    Chloride 86 (*)    Glucose, Bld 111 (*)    BUN 51 (*)    Creatinine, Ser 1.50 (*)    GFR calc non Af Amer 30 (*)    GFR calc Af Amer 35 (*)    All other components within normal limits  CBC - Abnormal; Notable for the following:    WBC 13.3 (*)    RDW 16.4 (*)    All other components within normal limits  URINALYSIS, ROUTINE W REFLEX MICROSCOPIC    Imaging Review Dg Chest 1 View  02/10/2014   CLINICAL DATA:  Fall today. History of chronic CHF and hypertension.  EXAM: CHEST - 1 VIEW  COMPARISON:  For/04/2014  FINDINGS: Cardiac silhouette is mildly enlarged. There are dense mitral valve annular calcifications. Mediastinum is normal in contour and caliber. There are dense calcifications along a normal caliber aorta. No hilar masses or adenopathy.  No acute findings in the lungs. No evidence of pulmonary edema or of pneumonia. There is no convincing pleural effusion and no pneumothorax.  Bony thorax is intact.  IMPRESSION: No acute cardiopulmonary disease.   Electronically Signed   By: Amie Portland M.D.   On: 02/10/2014 11:50   Dg Hip Complete Right  02/10/2014   CLINICAL DATA:  FALL  EXAM: RIGHT HIP - COMPLETE 2+ VIEW  COMPARISON:  None.  FINDINGS: There is no evidence of hip fracture or dislocation. Areas of  hypertrophic spurring involving acetabulum bilaterally. Periarticular sclerosis and hypertrophic spurring within the sacroiliac joints. Degenerative changes appreciated within the lower lumbar spine, and pubic symphysis. Atherosclerotic calcifications within the external iliac and femoral vessels.  IMPRESSION: Degenerative changes without acute osseous abnormalities.   Electronically Signed   By: Salome Holmes M.D.   On: 02/10/2014 11:54     EKG Interpretation None      MDM   Final diagnoses:  None  Mechanical fall- Patient presented after mechanical fall, likely 2/2 deconditioning.  She has been hospitalized twice recently including 2 week stint in rehab, in process of setting up home health.  Right hip xray negative, CXR negative.  BMP with slightly elevated Cr to 1.5 (1.3 at discharge on 4/12). UA negative. BP 110s systolic after PO fluid.     Discharge home with PCP follow-up.   ED Case manager worked with patient's family at length on home health services.   Rocco Serene, MD 02/10/14 [phone]

## 2014-02-10 NOTE — Progress Notes (Signed)
   CARE MANAGEMENT ED NOTE 02/10/2014  Patient:  [name],[name]   Account Number:  [id number]  Date Initiated:  02/10/2014  Documentation initiated by:  Ferdinand CavaSCHETTINO,Zionna Homewood  Subjective/Objective Assessment:   80 yo presenting in the ED post fall after recent discharge from the ED. Patient daughters at the bedside.     Subjective/Objective Assessment Detail:     Action/Plan:   Action/Plan Detail:   Anticipated DC Date:       Status Recommendation to Physician:   Result of Recommendation:      DC Planning Services  Other    Choice offered to / List presented to:            Status of service:    ED Comments:   ED Comments Detail:  Spoke with patient daughters [name] and [name] at the bedside regarding patient care in the home. Daughters stated that the patient has 24/7 caregivers in the home and [facility] in the home after the most recent discharge. Called and spoke with [facility]Mary [facility] for the [facility] and informed her that this patient is currently in the ED and unsure of disposition at this time. Corrie DandyMary confirmed that [facility] and once discharged will follow up within 24hours. Spoke with patient and daughters in room to follow up and [facility] and plan upon discharge and encouraged them to call [facility] with any questions or concerns regarding HH services. Will follow up with [facility] upon patient discharge.

## 2014-02-10 NOTE — ED Notes (Signed)
Patient transported to X-ray 

## 2014-02-11 NOTE — ED Provider Notes (Signed)
80 year old female with multiple medical problems and recent hospitalization which has left her increasingly weak.  She was being assisted in ambulation today at home by a health care aide when she felt that both knees gave out.  She denies loc or near syncope or lateralized symptoms.    Elderly female nad, generalized weakness.  Neuro- no focal deficits.  Daughters in room on my evaluation.  We discussed depth of work up and plan.  We checked labs to assure no acute changes- appear stable and plan discharge to home with f/u with Dr. Pete GlatterStoneking for ongoing treatment.    I performed a history and physical examination of [name] and discussed her management with Dr. Aundria Rudogers.  I agree with the history, physical, assessment, and plan of care, with the following exceptions: None  I was present for the following procedures: None Time Spent in Critical Care of the patient: None Time spent in discussions with the patient and family: 10  Nicey Krah S Lamonda Noxon    Hilario Quarryanielle S Mikhala Kenan, MD 02/11/14 1200

## 2014-02-12 ENCOUNTER — Ambulatory Visit (HOSPITAL_COMMUNITY)
Admission: RE | Admit: 2014-02-12 | Discharge: 2014-02-12 | Disposition: A | Discharge status: Home / Self Care | Payer: Medicare Other | Source: Ambulatory Visit | Attending: Internal Medicine | Admitting: Internal Medicine

## 2014-02-12 ENCOUNTER — Encounter (HOSPITAL_COMMUNITY): Payer: Self-pay

## 2014-02-12 VITALS — BP 113/83 | HR 81 | Resp 18 | Wt 154.2 lb

## 2014-02-12 DIAGNOSIS — I4891 Unspecified atrial fibrillation: Secondary | ICD-10-CM | POA: Diagnosis not present

## 2014-02-12 DIAGNOSIS — I5032 Chronic diastolic (congestive) heart failure: Secondary | ICD-10-CM

## 2014-02-12 DIAGNOSIS — N183 Chronic kidney disease, stage 3 unspecified: Secondary | ICD-10-CM | POA: Diagnosis not present

## 2014-02-12 DIAGNOSIS — I482 Chronic atrial fibrillation, unspecified: Secondary | ICD-10-CM

## 2014-02-12 DIAGNOSIS — E119 Type 2 diabetes mellitus without complications: Secondary | ICD-10-CM

## 2014-02-12 DIAGNOSIS — I739 Peripheral vascular disease, unspecified: Secondary | ICD-10-CM

## 2014-02-12 DIAGNOSIS — G609 Hereditary and idiopathic neuropathy, unspecified: Secondary | ICD-10-CM

## 2014-02-12 DIAGNOSIS — E43 Unspecified severe protein-calorie malnutrition: Secondary | ICD-10-CM

## 2014-02-12 DIAGNOSIS — I129 Hypertensive chronic kidney disease with stage 1 through stage 4 chronic kidney disease, or unspecified chronic kidney disease: Secondary | ICD-10-CM | POA: Diagnosis not present

## 2014-02-12 DIAGNOSIS — I509 Heart failure, unspecified: Secondary | ICD-10-CM

## 2014-02-12 DIAGNOSIS — Z66 Do not resuscitate: Secondary | ICD-10-CM | POA: Insufficient documentation

## 2014-02-12 DIAGNOSIS — I359 Nonrheumatic aortic valve disorder, unspecified: Secondary | ICD-10-CM

## 2014-02-12 DIAGNOSIS — I5030 Unspecified diastolic (congestive) heart failure: Secondary | ICD-10-CM

## 2014-02-12 DIAGNOSIS — I5021 Acute systolic (congestive) heart failure: Secondary | ICD-10-CM | POA: Diagnosis not present

## 2014-02-12 LAB — BASIC METABOLIC PANEL
BUN: 56 mg/dL — ABNORMAL HIGH (ref 6–23)
CHLORIDE: 81 meq/L — AB (ref 96–112)
CO2: 33 mEq/L — ABNORMAL HIGH (ref 19–32)
Calcium: 9.9 mg/dL (ref 8.4–10.5)
Creatinine, Ser: 1.56 mg/dL — ABNORMAL HIGH (ref 0.50–1.10)
GFR calc Af Amer: 33 mL/min — ABNORMAL LOW (ref >90)
GFR calc non Af Amer: 29 mL/min — ABNORMAL LOW (ref >90)
Glucose, Bld: 137 mg/dL — ABNORMAL HIGH (ref 70–99)
POTASSIUM: 3.4 meq/L — AB (ref 3.7–5.3)
Sodium: 132 mEq/L — ABNORMAL LOW (ref 137–147)

## 2014-02-12 LAB — PRO B NATRIURETIC PEPTIDE: PRO B NATRI PEPTIDE: 1542 pg/mL — AB (ref 0–450)

## 2014-02-12 NOTE — Patient Instructions (Signed)
Follow up in 4  Weeks   Do the following things EVERYDAY: 1) Weigh yourself in the morning before breakfast. Write it down and keep it in a log. 2) Take your medicines as prescribed 3) Eat low salt foods-Limit salt (sodium) to 2000 mg per day.  4) Stay as active as you can everyday 5) Limit all fluids for the day to less than 2 liters

## 2014-02-12 NOTE — Progress Notes (Signed)
Patient ID: [name], female   DOB: 1924-03-11, 80 y.o.   MRN: [id number]        Primary Physician: Dr Pete GlatterStoneking  Primary Cardiologist: Dr Mayford Knifeurner   HPI: [name] is an 80 year old with a history of diastolic CHF EF 55-60% 12/2013, HTN, HLD, DM, MR and AS, chronic A-fib. OSA on CPAP. She was admitted 3/15 with volume overload. She was placed on lasix drip and later transitioned to lasix 40 mg twice a day. Discharge weight at that time was 161 pounds. She was discharged to CIR. While at CIR she developed hyponatremia and nephrology was consulted with recommendations to cut lasix back to 20 mg twice a day and restrict fluids. [facility] with weight 160 pounds. She was readmitted due to fall and recurrent HF on 02/03/14. Diuresed with IV lasix. and transitioned to lasix 40 mg bid. Discharge weight 148 pounds.   She returns for follow up with her son.  Requesting DNR form. Complains of fatigue. Mild dyspnea with exertion. Ambulates with a walker and a gait belt. Uses CPAP nightly Denies orthopnea. Weight at home 151 pounds. Tires to follow low salt diet per care givers. [facility]. She has also is followed by [facility].   FH: Mother, Father, Brother had heart disease  SH: Widowed. Lives at home alone but has 24 hour care givers. Retired from [name]. She does smoker or drink alcohol  Labs 02/10/14 K 3.8 Creatinine 1.5   ROS: All systems negative except as listed in HPI, PMH and Problem List.  Past Medical History  Diagnosis Date  . Hypertension   . Peripheral neuropathy   . Asthma   . High cholesterol   . Diabetes with neurologic complications   . PAD (peripheral artery disease)   . Enlarged heart   . Anginal pain 05/28/12  . Pneumonia 11/2010  . Sleep apnea     cpap  . Hypothyroidism   . Headache(784.0) 05/29/12    "recently"  . Stroke 05/2005; 08/2010    !mini"  . Personal history of gout   . Hypothyroidism   .  DJD (degenerative joint disease)   . Carpal tunnel syndrome, bilateral   . Recurrent labyrinthitis   . GERD (gastroesophageal reflux disease)     S/P esophageal dilation in 1996  . Gallstone     Silent  . Fibrocystic breast changes   . Hemorrhoids     with bleeding  . Hx of adenomatous colonic polyps     Dr Sherin QuarryWeissman  . Diverticulitis     left side  . MR (mitral regurgitation)     moderate  . Mild anemia     hemoglobin 11.8 on 06/2009  . Edema of lower extremity     Chronic  . Ischemic colitis 8/13  . Aortic stenosis, moderate   . Chronic diastolic CHF (congestive heart failure)   . Moderate mitral regurgitation   . CKD (chronic kidney disease) stage 3, GFR 30-59 ml/min     "something on labs always say she has some problems"  . Chronic atrial fibrillation     Current Outpatient Prescriptions  Medication Sig Dispense Refill  . acetaminophen (TYLENOL) 500 MG tablet Take 500 mg by mouth every 6 (six) hours as needed for pain.      Marland Kitchen. allopurinol (ZYLOPRIM) 300 MG tablet Take 1 tablet (300 mg total) by mouth daily.  60 tablet  1  . apixaban (  ELIQUIS) 2.5 MG TABS tablet Take 1 tablet (2.5 mg total) by mouth 2 (two) times daily.  60 tablet  0  . atorvastatin (LIPITOR) 10 MG tablet Take 1 tablet (10 mg total) by mouth daily.  30 tablet  1  . Calcium Carbonate-Vitamin [name] (CALCIUM-VITAMIN [name]) 600-200 MG-UNIT CAPS Take 1 tablet by mouth daily.       . furosemide (LASIX) 40 MG tablet Take 1 tablet (40 mg total) by mouth 2 (two) times daily.  60 tablet  0  . gabapentin (NEURONTIN) 300 MG capsule Take 1 capsule (300 mg total) by mouth at bedtime.  30 capsule  1  . levothyroxine (SYNTHROID, LEVOTHROID) 100 MCG tablet Take 100 mcg by mouth daily before breakfast.      . metoprolol succinate (TOPROL-XL) 25 MG 24 hr tablet Take 0.5 tablets (12.5 mg total) by mouth daily.  15 tablet  1  . Multiple Vitamins-Minerals (MULTIVITAMIN WITH MINERALS) tablet Take 1 tablet by mouth daily.      . Omega-3  Fatty Acids (FISH OIL) 1000 MG CAPS Take 1,000 mg by mouth daily.       Marland Kitchen. oxybutynin (DITROPAN) 5 MG tablet Take 2 tablets (10 mg total) by mouth at bedtime.  60 tablet  1  . polycarbophil (FIBERCON) 625 MG tablet Take 625 mg by mouth 2 (two) times daily.      . potassium chloride (K-DUR,KLOR-CON) 10 MEQ tablet Take 1 tablet (10 mEq total) by mouth 2 (two) times daily.  60 tablet  1  . vitamin C (ASCORBIC ACID) 500 MG tablet Take 500 mg by mouth daily.      . zaleplon (SONATA) 5 MG capsule Take 2 capsules (10 mg total) by mouth at bedtime.  30 capsule  1   No current facility-administered medications for this encounter.      Filed Vitals:   02/12/14 1410  BP: 113/83  Pulse: 81  Resp: 18  Weight: 154 lb 4 oz (69.967 kg)  SpO2: 96%     Physical Exam:  General: Chronically ill appearing. No resp difficulty . Sitting wheelchair HEENT: normal  Neck: supple. JVP 5-6. Carotids 2+ bilat; no bruits. No lymphadenopathy or thryomegaly appreciated.  Cor: PMI nondisplaced. Irregular rate & rhythm. No rubs, gallops. TR.  Lungs: clear  Abdomen: soft, nontender, non distended. No hepatosplenomegaly. No bruits or masses. Good bowel sounds.  Extremities: no cyanosis, clubbing, rash, edema  R and L fingers cool to touch.  Neuro: alert & orientedx3, cranial nerves grossly intact. moves all 4 extremities w/o difficulty. Affect pleasant   ASSESSMENT & PLAN:  1. Chronic Diastolic Heart Failure Volume status stable. Dry weight at home 154-157 pounds. Continue  lasix 40 mg twice a day. Check BMET She would like to liberalize her diet and I have suggested she do so in moderation.   2. Chronic A fib- Off amiodarone since last admit. Rate controlled. Continue eliquis 2.5 mg twice a day. No bleeding problems.     3. DNR- [name] is requesting DNR form for home. I have signed gold DNR form. May benefit from Hospice referral. They will discuss with Dr Pete GlatterStoneking tomorrow.   4. Immobility- requires  assistance with ambulation,  [name] Hinata Diener Np-C  2:59 PM

## 2014-02-13 ENCOUNTER — Telehealth (HOSPITAL_COMMUNITY): Payer: Self-pay

## 2014-02-13 NOTE — Telephone Encounter (Signed)
Left message for daughter to call back to discuss lab results and increase in potassium to bid.

## 2014-02-16 ENCOUNTER — Telehealth (HOSPITAL_COMMUNITY): Payer: Self-pay

## 2014-02-16 ENCOUNTER — Other Ambulatory Visit (HOSPITAL_COMMUNITY): Payer: Self-pay

## 2014-02-16 ENCOUNTER — Telehealth (HOSPITAL_COMMUNITY): Payer: Self-pay | Admitting: Cardiology

## 2014-02-16 MED ORDER — POTASSIUM CHLORIDE CRYS ER 10 MEQ PO TBCR: 20.0000 meq | EXTENDED_RELEASE_TABLET | Freq: Two times a day (BID) | ORAL | Status: DC

## 2014-02-16 NOTE — Telephone Encounter (Signed)
Daughter made aware of lab results, instructed to increase patient's potassium to 20 meq twice daily.  Aware and agreeable.  Updated Rx sent to preferred pharmacy electronically. [address] FilterMegan Genevea Lareen Mullings

## 2014-02-16 NOTE — Telephone Encounter (Signed)
Correction 20 meq BID potassium

## 2014-02-16 NOTE — Telephone Encounter (Signed)
[facility] Also wanted to inform office PCP decreased lasix dose

## 2014-02-19 ENCOUNTER — Telehealth (HOSPITAL_COMMUNITY): Payer: Self-pay | Admitting: Surgery

## 2014-02-19 NOTE — Telephone Encounter (Signed)
Called patient's daughter and clarified per Ulla PotashAli Cosgrove NP--AHF team would like for patient to continue Lasix 40 mg twice daily for symptom management.  And Potassium 20 meq twice daily.  Patient's daughter was very upset that Dr. Pete GlatterStoneking and HF clinic seem to be "not talking".  She was confused as to whether or not her mother should be taking Lasix and Potassium once daily or twice daily.  She seemed to understand at the end of the conversation.  Karie Mainlandli explained that she would call Dr. Laverle HobbyStoneking's office and discuss medications to make them aware of AHF team plan.

## 2014-03-04 ENCOUNTER — Telehealth (HOSPITAL_COMMUNITY): Payer: Self-pay | Admitting: Cardiology

## 2014-03-04 NOTE — Telephone Encounter (Signed)
Judith HerrlichFaye, PT [facility] Called to request rx for wheelchair

## 2014-03-04 NOTE — Telephone Encounter (Signed)
Returned call to Lucendia HerrlichFaye [phone]) left VM to get order from pcp if possible

## 2014-03-10 NOTE — Progress Notes (Signed)
Patient ID: [name], female   DOB: 08-12-1924, 80 y.o.   MRN: [id number]  Primary Physician: Dr Pete GlatterStoneking  Primary Cardiologist: Dr Mayford Knifeurner   HPI: [name] is an 80 year old with a history of diastolic CHF EF 55-60% 12/2013, HTN, HLD, DM, MR and AS, chronic A-fib. OSA on CPAP. She was admitted 3/15 with volume overload. She was placed on lasix drip and later transitioned to lasix 40 mg twice a day. Discharge weight at that time was 161 pounds. She was discharged to CIR. While at CIR she developed hyponatremia and nephrology was consulted with recommendations to cut lasix back to 20 mg twice a day and restrict fluids. [facility] with weight 160 pounds. She was readmitted due to fall and recurrent HF on 02/03/14. Diuresed with IV lasix. and transitioned to lasix 40 mg bid. Discharge weight 148 pounds.   She returns for follow up with her son. Overall feeling much better.  Denies SOB/PND/Orthopnea. Does admit to mild dyspnea when she is walking briskly. No falls. Weight at home 147-149 pounds. Appetite improved. Uses CPAP nightly.  [facility] She has also is followed by [facility]  24 hours a day.     FH: Mother, Father, Brother had heart disease  SH: Widowed. Lives at home alone but has 24 hour care givers. Retired from [employer]. She does smoker or drink alcohol  Labs 02/10/14 K 3.8 Creatinine 1.5   ROS: All systems negative except as listed in HPI, PMH and Problem List.  Past Medical History  Diagnosis Date  . Hypertension   . Peripheral neuropathy   . Asthma   . High cholesterol   . Diabetes with neurologic complications   . PAD (peripheral artery disease)   . Enlarged heart   . Anginal pain 05/28/12  . Pneumonia 11/2010  . Sleep apnea     cpap  . Hypothyroidism   . Headache(784.0) 05/29/12    "recently"  . Stroke 05/2005; 08/2010    !mini"  . Personal history of gout   . Hypothyroidism   . DJD (degenerative  joint disease)   . Carpal tunnel syndrome, bilateral   . Recurrent labyrinthitis   . GERD (gastroesophageal reflux disease)     S/P esophageal dilation in 1996  . Gallstone     Silent  . Fibrocystic breast changes   . Hemorrhoids     with bleeding  . Hx of adenomatous colonic polyps     Dr Sherin QuarryWeissman  . Diverticulitis     left side  . MR (mitral regurgitation)     moderate  . Mild anemia     hemoglobin 11.8 on 06/2009  . Edema of lower extremity     Chronic  . Ischemic colitis 8/13  . Aortic stenosis, moderate   . Chronic diastolic CHF (congestive heart failure)   . Moderate mitral regurgitation   . CKD (chronic kidney disease) stage 3, GFR 30-59 ml/min     "something on labs always say she has some problems"  . Chronic atrial fibrillation     Current Outpatient Prescriptions  Medication Sig Dispense Refill  . acetaminophen (TYLENOL) 500 MG tablet Take 500 mg by mouth every 6 (six) hours as needed for pain.      Marland Kitchen. allopurinol (ZYLOPRIM) 300 MG tablet Take 1 tablet (300 mg total) by mouth daily.  60 tablet  1  . apixaban (ELIQUIS) 2.5 MG TABS tablet Take 1  tablet (2.5 mg total) by mouth 2 (two) times daily.  60 tablet  0  . atorvastatin (LIPITOR) 10 MG tablet Take 1 tablet (10 mg total) by mouth daily.  30 tablet  1  . Calcium Carbonate-Vitamin D (CALCIUM-VITAMIN D) 600-200 MG-UNIT CAPS Take 1 tablet by mouth daily.       . furosemide (LASIX) 40 MG tablet Take 1 tablet (40 mg total) by mouth 2 (two) times daily.  60 tablet  0  . gabapentin (NEURONTIN) 300 MG capsule Take 1 capsule (300 mg total) by mouth at bedtime.  30 capsule  1  . levothyroxine (SYNTHROID, LEVOTHROID) 100 MCG tablet Take 112 mcg by mouth daily before breakfast.       . metoprolol succinate (TOPROL-XL) 25 MG 24 hr tablet Take 0.5 tablets (12.5 mg total) by mouth daily.  15 tablet  1  . Multiple Vitamins-Minerals (MULTIVITAMIN WITH MINERALS) tablet Take 1 tablet by mouth daily.      . Omega-3 Fatty Acids (FISH  OIL) 1000 MG CAPS Take 1,000 mg by mouth daily.       . potassium chloride (K-DUR,KLOR-CON) 10 MEQ tablet Take 2 tablets (20 mEq total) by mouth 2 (two) times daily.  60 tablet  1  . zaleplon (SONATA) 5 MG capsule Take 2 capsules (10 mg total) by mouth at bedtime.  30 capsule  1   No current facility-administered medications for this encounter.      Filed Vitals:   03/12/14 1321  BP: 139/81  Pulse: 66  Resp: 18  Weight: 154 lb (69.854 kg)  SpO2: 100%     Physical Exam:  General: Chronically ill appearing. No resp difficulty . Sitting wheelchair Son and caregiver present  HEENT: normal  Neck: supple. JVP 5-6. Carotids 2+ bilat; no bruits. No lymphadenopathy or thryomegaly appreciated.  Cor: PMI nondisplaced. Irregular rate & rhythm. No rubs, gallops. TR.  Lungs: clear  Abdomen: soft, nontender, non distended. No hepatosplenomegaly. No bruits or masses. Good bowel sounds.  Extremities: no cyanosis, clubbing, rash, edema   Neuro: alert & orientedx3, cranial nerves grossly intact. moves all 4 extremities w/o difficulty. Affect pleasant   ASSESSMENT & PLAN:  1. Chronic Diastolic Heart Failure Today much improved. NYHA IIIb. Volume status stable. Dry weight at home 147-149 pounds. Continue  lasix 40 mg twice a day.   2. Chronic A fib- Off amiodarone since last admit. Rate controlled. Continue eliquis 2.5 mg twice a day. No bleeding problems.     3. DNR- Last visit we discussed Hospice but today she is feeling much better and they would like to hold off for now. They will discuss with Dr Pete GlatterStoneking at next visit. tomorrow.   4. Immobility- requires assistance with ambulation.   Follow up in 4 months   Avin Upperman D Lubna Stegeman NP-C

## 2014-03-12 ENCOUNTER — Ambulatory Visit (HOSPITAL_COMMUNITY)
Admission: RE | Admit: 2014-03-12 | Discharge: 2014-03-12 | Disposition: A | Discharge status: Home / Self Care | Payer: Medicare Other | Source: Ambulatory Visit | Attending: Internal Medicine | Admitting: Internal Medicine

## 2014-03-12 ENCOUNTER — Encounter (HOSPITAL_COMMUNITY): Payer: Self-pay

## 2014-03-12 VITALS — BP 139/81 | HR 66 | Resp 18 | Wt 154.0 lb

## 2014-03-12 DIAGNOSIS — E119 Type 2 diabetes mellitus without complications: Secondary | ICD-10-CM | POA: Insufficient documentation

## 2014-03-12 DIAGNOSIS — G4733 Obstructive sleep apnea (adult) (pediatric): Secondary | ICD-10-CM

## 2014-03-12 DIAGNOSIS — I509 Heart failure, unspecified: Secondary | ICD-10-CM | POA: Insufficient documentation

## 2014-03-12 DIAGNOSIS — I4891 Unspecified atrial fibrillation: Secondary | ICD-10-CM | POA: Insufficient documentation

## 2014-03-12 DIAGNOSIS — I5022 Chronic systolic (congestive) heart failure: Secondary | ICD-10-CM | POA: Insufficient documentation

## 2014-03-12 DIAGNOSIS — I5030 Unspecified diastolic (congestive) heart failure: Secondary | ICD-10-CM

## 2014-03-12 DIAGNOSIS — E039 Hypothyroidism, unspecified: Secondary | ICD-10-CM | POA: Insufficient documentation

## 2014-03-12 DIAGNOSIS — I10 Essential (primary) hypertension: Secondary | ICD-10-CM | POA: Insufficient documentation

## 2014-03-12 DIAGNOSIS — Z66 Do not resuscitate: Secondary | ICD-10-CM | POA: Insufficient documentation

## 2014-03-12 NOTE — Patient Instructions (Signed)
   Follow up in 4 months  Do the following things EVERYDAY: 1) Weigh yourself in the morning before breakfast. Write it down and keep it in a log. 2) Take your medicines as prescribed 3) Eat low salt foods-Limit salt (sodium) to 2000 mg per day.  4) Stay as active as you can everyday 5) Limit all fluids for the day to less than 2 liters 

## 2014-03-13 ENCOUNTER — Other Ambulatory Visit (HOSPITAL_COMMUNITY): Payer: Self-pay | Admitting: Internal Medicine

## 2014-04-02 ENCOUNTER — Ambulatory Visit: Payer: Medicare Other | Admitting: Podiatrist

## 2014-04-07 ENCOUNTER — Other Ambulatory Visit: Payer: Self-pay

## 2014-04-07 DIAGNOSIS — Z1231 Encounter for screening mammogram for malignant neoplasm of breast: Secondary | ICD-10-CM

## 2014-04-09 ENCOUNTER — Encounter: Payer: Self-pay | Admitting: Podiatrist

## 2014-04-09 ENCOUNTER — Ambulatory Visit (INDEPENDENT_AMBULATORY_CARE_PROVIDER_SITE_OTHER): Payer: Medicare Other | Admitting: Podiatrist

## 2014-04-09 VITALS — BP 128/48 | HR 64 | Resp 17 | Ht 67.0 in | Wt 150.0 lb

## 2014-04-09 DIAGNOSIS — L97509 Non-pressure chronic ulcer of other part of unspecified foot with unspecified severity: Secondary | ICD-10-CM

## 2014-04-09 DIAGNOSIS — E1149 Type 2 diabetes mellitus with other diabetic neurological complication: Secondary | ICD-10-CM

## 2014-04-09 DIAGNOSIS — M79609 Pain in unspecified limb: Secondary | ICD-10-CM

## 2014-04-09 DIAGNOSIS — B351 Tinea unguium: Secondary | ICD-10-CM

## 2014-04-09 MED ORDER — SILVER SULFADIAZINE 1 % EX CREA: 1.0000 "application " | TOPICAL_CREAM | Freq: Two times a day (BID) | CUTANEOUS | Status: DC

## 2014-04-09 NOTE — Patient Instructions (Signed)
Instructions for Wound Care  The most important step to healing a foot wound is to reduce the pressure on your foot - it is extremely important to stay off your foot as much as possible   Cleanse your foot with saline wash or warm soapy water (dial antibacterial soap or similar).  Blot dry.  Apply prescribed medication to your wound and cover with gauze and a bandage.  May hold bandage in place with Coban (self sticky wrap), Ace bandage or tape.  You may find dressing supplies at your local [employer], [employer], drug store or medical supply store.  Your prescribed topical medication is :  Silvadene Cream (twice daily)   If you notice any foul odor, increase in pain, pus, increased swelling, red streaks or generalized redness occurring in your foot or leg-Call our office immediately to be seen.  This may be a sign of a limb or life threatening infection that will need prompt attention.   

## 2014-04-14 NOTE — Progress Notes (Addendum)
HPI: Patient presents today for follow up of diabetic foot and nail care. Past medical history, meds, and allergies reviewed. Patient states blood sugar is under good control. New lesion present submet 1 of the left foot that is bothersome  Objective: Objective: Patients chart is reviewed. Vascular status reveals pedal pulses noted at 2 out of 4 dp and pt bilateral . Neurological sensation is Decreased to [facility] monofilament bilateral at 3/5 sites. Toenails are elongated, incurvated, discolored, dystrophic with ingrown deformity present. Ulceration at dermal level is present submet 1 of the left foot.  No redness, or swelling is present  Assessment: Diabetes with Neuropathy , Ingrown nail deformity, history of gout , ulceration submet 1 left  Plan: Discussed treatment options and alternatives. Debrided nails without complication. Debrided the ulceration and applied iodosorb and a dressing-- she will continue with her at home wound care.  Will recheck the ulcer in 2 weeks.

## 2014-04-24 ENCOUNTER — Encounter: Payer: Self-pay | Admitting: Podiatrist

## 2014-04-24 ENCOUNTER — Ambulatory Visit (INDEPENDENT_AMBULATORY_CARE_PROVIDER_SITE_OTHER): Payer: Medicare Other | Admitting: Podiatrist

## 2014-04-24 VITALS — BP 149/56 | HR 58 | Resp 15 | Ht 67.0 in | Wt 154.0 lb

## 2014-04-24 DIAGNOSIS — E1149 Type 2 diabetes mellitus with other diabetic neurological complication: Secondary | ICD-10-CM

## 2014-04-24 DIAGNOSIS — L97509 Non-pressure chronic ulcer of other part of unspecified foot with unspecified severity: Secondary | ICD-10-CM

## 2014-04-24 NOTE — Progress Notes (Signed)
Patient presents today for follow up of ulceration submet 1 of the left foot. She has been applying silvadene cream to the ulceration and states it is not painful to her.  Objective:  Neurovascular status unchanged.  Pulses are palpable and neurological sensation is decreased.  Superficial ulcer present submet 1 of the left foot.  Red granular base is present and ulcer is equal with the surrounding skin surface.  No redness, streaking, swelling or signs of infection are present.  Stable and healing ulceration is present.  Assessment:  Ulceration submet 1 of the left foot  Plan:  Applied silvadene cream to the foot and a dressing . Recommended continuing dressing changes as she has been doing at home.  She will call if any concerns arise and I will see her back for a recheck

## 2014-05-15 ENCOUNTER — Ambulatory Visit (INDEPENDENT_AMBULATORY_CARE_PROVIDER_SITE_OTHER): Payer: Medicare Other | Admitting: Podiatrist

## 2014-05-15 ENCOUNTER — Encounter: Payer: Self-pay | Admitting: Podiatrist

## 2014-05-15 DIAGNOSIS — E1149 Type 2 diabetes mellitus with other diabetic neurological complication: Secondary | ICD-10-CM

## 2014-05-15 DIAGNOSIS — L97509 Non-pressure chronic ulcer of other part of unspecified foot with unspecified severity: Secondary | ICD-10-CM

## 2014-05-15 NOTE — Patient Instructions (Signed)
Ulcer looks great-- put the cream and bandaid on for 1 more week then you may discontinue the dressings.

## 2014-05-15 NOTE — Progress Notes (Signed)
Patient presents today for follow up of ulceration submet 1 of the left foot. She has been applying silvadene cream to the ulceration and states it is not painful to her.   Objective: Neurovascular status unchanged. Pulses are palpable and neurological sensation is decreased. Almost healed Superficial ulcer present submet 1 of the left foot. Very small ulceration with Red granular base is present and ulcer is equal with the surrounding skin surface. No redness, streaking, swelling or signs of infection are present. Stable and healing ulceration is present.   Assessment: Ulceration submet 1 of the left foot   Plan: Applied silvadene cream to the foot and a dressing . Recommended continuing dressing changes for one more week as she has been doing at home. She will call if any concerns arise and I will see her back for a routine appointment

## 2014-05-18 ENCOUNTER — Encounter: Payer: Self-pay | Admitting: Nurse Practitioner

## 2014-05-18 ENCOUNTER — Ambulatory Visit (INDEPENDENT_AMBULATORY_CARE_PROVIDER_SITE_OTHER): Payer: Medicare Other | Admitting: Nurse Practitioner

## 2014-05-18 VITALS — BP 113/62 | HR 90 | Temp 98.3°F | Ht 66.5 in | Wt 152.8 lb

## 2014-05-18 DIAGNOSIS — Z9181 History of falling: Secondary | ICD-10-CM

## 2014-05-18 DIAGNOSIS — Z8673 Personal history of transient ischemic attack (TIA), and cerebral infarction without residual deficits: Secondary | ICD-10-CM

## 2014-05-18 DIAGNOSIS — R296 Repeated falls: Secondary | ICD-10-CM

## 2014-05-18 DIAGNOSIS — R269 Unspecified abnormalities of gait and mobility: Secondary | ICD-10-CM

## 2014-05-18 NOTE — Patient Instructions (Addendum)
Continue Apixiban for atrial fibrillation and secondary stroke prevention and maintain strict control of hypertension with blood pressure goal below 140/90, and lipids with LDL cholesterol goal below 100 mg/dL.  Follow up in 1 year, sooner as needed.  I [facility], someone should call you within the next 2 weeks.

## 2014-05-18 NOTE — Progress Notes (Signed)
I agree with above 

## 2014-05-18 NOTE — Progress Notes (Signed)
PATIENT: [name] DOB: February 10, 1924  REASON FOR VISIT: routine follow up for stroke HISTORY FROM: patient  HISTORY OF PRESENT ILLNESS: [name] is a right-handed 80 year-old Caucasian female with left hemispheric subcortical infarct from cardiogenic embolism from new onset facial fibrillation in November 2011 who has done very well. She has gait difficulties at baseline from prior diabetic peripheral polyneuropathy which appears to be slightly worse after her stroke. Vascular risk factors of diabetes, hypertension, hyperlipidemia, atrial fibrillation and congestive heart failure.  Returns for followup since last visit on 04/15/2012. She's doing well without recurrent neurovascular symptoms. Her blood sugars are ranging 109-148 and she has been discontinued from one of her antidiabetic medications. Her blood pressure has been 120s systolic. Continues to tolerate Doxil without significant bruising, also on aspirin 81 mg. Her last carotid Doppler was on 12/27/2011. Her son only complains that she has no interest in activities. She denies feeling depressed.   Update 12/17 2013: She returns for followup after last visit on 10/03/2011. She continues to do well without any recurrent neurovascular symptoms. She's tolerating Pradaxa without major bleeding but has easy bruisability. She has brought along her blood pressure and sugar log with her. Blood pressure consistently runs between 110s to 120s range. Fasting sugars have ranged from 115-148 mg percent. She had lipid profile checked a month ago by Dr. Pete Glatter and it was apparently fine though I do not have those results. She had carotid ultrasound done on 12/27/2011 which showed minor plaques without hemodynamically significant stenosis. She has no new complaints today.   UPDATE 05/19/13: Returns for followup after last visit on 10/15/2012. She continues to do well without any recurrent neurovascular symptoms. She's tolerating Pradaxa  without major bleeding but has easy bruisability. She has brought along her blood pressure and sugar log with her. Blood pressure consistently runs between 110s to 120s range. Fasting sugars have ranged from 100-209 mg percent. Last hemoglobin A1c was 6.3. She ambulates with a rolling walker at all times and has a slow but steady gait. She lives at home alone but has a 24-hour caregiver with her at all times. She went to the hospital in early June for abdominal pain and cardiac cause was ruled out. She was not admitted and felt better quickly. She and her daughter do not think her carotid Doppler has been repeated since February 2013.   UPDATE 11/17/13 (LL): Patient returns for stroke follow up. Repeat Carotid dopplers on 05/27/13 were negative for stenosis. She's tolerating Pradaxa without major bleeding but has easy bruisability. She states her BP is well controlled, it is 139/72 in office today. She has brought along her blood pressure and sugar log with her. Her blood sugars are well controlled without medication. She denies falls at home, using a rolling walker at all times.   Update 05/18/14 (LL): Patient returns for stroke follow up, accompanied by her son and caregiver. Since last visit she was changed from Pradaxa to Eliquis.  She has had 2 hospital admissions since last visit, one for CHF exacerbation, discharged to [facility], and 2 weeks later after a fall and again volume overloaded.  She has had increased gait instability.  She has fallen twice since last visit, last fall with abrasions to right lower leg not requiring stitches.  She uses a front wheeled walker.  She still lives at home with a full-time caregiver. Her blood sugars are averaging 90 to 130 fasting.  Her blood pressure is well controlled, it is  113/62 in the office today. Uses CPAP nightly.  REVIEW OF SYSTEMS: Full 14 system review of systems performed and notable only for:  Hearing loss, shortness of breath, leg swelling, apnea, snoring,  incontinence of bladder, frequency of urination, walking difficulty, joint pain, wounds, walking difficulty, weakness, Easy bruising easy bleeding.   ALLERGIES: Allergies  Allergen Reactions  . Aspirin Other (See Comments)    Tears my stomach up; "can take coated Aspirin qd without problems"  . Ambien [Zolpidem Tartrate]     Pt does not wakeup from [place]  . Nexium [Esomeprazole Magnesium] Rash  . Penicillins Rash    Tolerates Rocephin    HOME MEDICATIONS: Outpatient Prescriptions Prior to Visit  Medication Sig Dispense Refill  . acetaminophen (TYLENOL) 500 MG tablet Take 500 mg by mouth every 6 (six) hours as needed for pain.      Marland Kitchen allopurinol (ZYLOPRIM) 300 MG tablet Take 1 tablet (300 mg total) by mouth daily.  60 tablet  1  . apixaban (ELIQUIS) 2.5 MG TABS tablet Take 1 tablet (2.5 mg total) by mouth 2 (two) times daily.  60 tablet  0  . atorvastatin (LIPITOR) 10 MG tablet Take 1 tablet (10 mg total) by mouth daily.  30 tablet  1  . Calcium Carbonate-Vitamin D (CALCIUM-VITAMIN D) 600-200 MG-UNIT CAPS Take 1 tablet by mouth daily.       . furosemide (LASIX) 40 MG tablet Take 1 tablet (40 mg total) by mouth 2 (two) times daily.  60 tablet  0  . gabapentin (NEURONTIN) 300 MG capsule Take 1 capsule (300 mg total) by mouth at bedtime.  30 capsule  1  . KLOR-CON M10 10 MEQ tablet TAKE 2 TABLETS BY MOUTH TWICE DAILY  120 tablet  3  . levothyroxine (SYNTHROID, LEVOTHROID) 100 MCG tablet Take 112 mcg by mouth daily before breakfast.       . metoprolol succinate (TOPROL-XL) 25 MG 24 hr tablet Take 0.5 tablets (12.5 mg total) by mouth daily.  15 tablet  1  . Multiple Vitamins-Minerals (MULTIVITAMIN WITH MINERALS) tablet Take 1 tablet by mouth daily.      . Omega-3 Fatty Acids (FISH OIL) 1000 MG CAPS Take 1,000 mg by mouth daily.       . silver sulfADIAZINE (SILVADENE) 1 % cream Apply 1 application topically 2 (two) times daily.  50 g  0  . zaleplon (SONATA) 5 MG capsule Take 2 capsules (10  mg total) by mouth at bedtime.  30 capsule  1   No facility-administered medications prior to visit.    PHYSICAL EXAM Filed Vitals:   05/18/14 1428  BP: 113/62  Pulse: 90  Temp: 98.3 F (36.8 C)  TempSrc: Oral  Height: 5' 6.5" (1.689 m)  Weight: 152 lb 12.8 oz (69.31 kg)   Body mass index is 24.3 kg/(m^2).  Generalized: Well developed, frail elderly Caucasian female in no acute distress  Head: normocephalic and atraumatic. Oropharynx benign  Neck: Supple, no carotid bruits  Cardiac: Irregular rate and rhythm, systolic murmur  Musculoskeletal: No deformity, abrasions to right lower leg   Neurological examination  Mentation: Alert oriented to time, place, history taking. Follows all commands speech and language fluent Cranial nerve II-XII: Fundoscopic exam not done. Pupils were equal round reactive to light extraocular movements were full, visual field were full on confrontational test. Facial sensation and strength were normal. hearing was intact to finger rubbing bilaterally. Uvula tongue midline. head turning and shoulder shrug and were normal and symmetric.Tongue protrusion into  cheek strength was normal. Motor: The motor testing reveals 5 over 5 strength of all 4 extremities. Good symmetric motor tone is noted throughout.  Sensory: Sensory testing is intact to soft touch on all 4 extremities. No evidence of extinction is noted.  Coordination: Cerebellar testing reveals good finger-nose-finger and heel-to-shin bilaterally.  Gait and station: Not tested, in wheelchair.  Reflexes: Deep tendon reflexes are symmetric and normal bilaterally.   ASSESSMENT: [name] is a right-handed 80 year-old Caucasian female with left hemispheric subcortical infarct from cardiogenic embolism from new onset atrial fibrillation in November 2011 who has done well. She has gait difficulties at baseline from prior peripheral polyneuropathy which appears to be slightly worse after her stroke.  Vascular risk factors of diabetes, hypertension, hyperlipidemia, atrial fibrillation and congestive heart failure.   PLAN:  Continue Apixiban for atrial fibrillation and secondary stroke prevention and maintain strict control of hypertension with blood pressure goal below 140/90, and lipids with LDL cholesterol goal below 100 mg/dL.  I am [facility] for frequent falls, strength and balance training. Use walker at all times. Follow up in 1 year with Dr. Pearlean BrownieSethi, sooner as needed.  Orders Placed This Encounter  Procedures  . Ambulatory referral to Home Health   Return in about 1 year (around 05/19/2015) for stroke, gait difficulty.  Tawny AsalLYNN E. Chrisa Hassan, MSN, FNP-BC, A/GNP-C 05/18/2014, 4:48 PM [facility] [address], [address] [place], [place] [address] [phone]  Note: This document was prepared with digital dictation and possible smart phrase technology. Any transcriptional errors that result from this process are unintentional.

## 2014-05-22 ENCOUNTER — Ambulatory Visit
Admission: RE | Admit: 2014-05-22 | Discharge: 2014-05-22 | Disposition: A | Discharge status: Home / Self Care | Payer: Medicare Other | Source: Ambulatory Visit

## 2014-05-22 DIAGNOSIS — Z1231 Encounter for screening mammogram for malignant neoplasm of breast: Secondary | ICD-10-CM

## 2014-05-25 ENCOUNTER — Other Ambulatory Visit: Payer: Self-pay | Admitting: Geriatric Medicine

## 2014-05-25 DIAGNOSIS — R928 Other abnormal and inconclusive findings on diagnostic imaging of breast: Secondary | ICD-10-CM

## 2014-05-26 ENCOUNTER — Ambulatory Visit
Admission: RE | Admit: 2014-05-26 | Discharge: 2014-05-26 | Disposition: A | Discharge status: Home / Self Care | Payer: Medicare Other | Source: Ambulatory Visit | Attending: Nurse Practitioner | Admitting: Nurse Practitioner

## 2014-05-26 ENCOUNTER — Other Ambulatory Visit: Payer: Self-pay | Admitting: Nurse Practitioner

## 2014-05-26 DIAGNOSIS — M25571 Pain in right ankle and joints of right foot: Secondary | ICD-10-CM

## 2014-06-02 ENCOUNTER — Other Ambulatory Visit: Payer: Medicare Other

## 2014-06-05 ENCOUNTER — Ambulatory Visit
Admission: RE | Admit: 2014-06-05 | Discharge: 2014-06-05 | Disposition: A | Discharge status: Home / Self Care | Payer: Medicare Other | Source: Ambulatory Visit | Attending: Geriatric Medicine | Admitting: Geriatric Medicine

## 2014-06-05 ENCOUNTER — Other Ambulatory Visit: Payer: Self-pay | Admitting: Geriatric Medicine

## 2014-06-05 DIAGNOSIS — N63 Unspecified lump in unspecified breast: Secondary | ICD-10-CM

## 2014-06-05 DIAGNOSIS — R928 Other abnormal and inconclusive findings on diagnostic imaging of breast: Secondary | ICD-10-CM

## 2014-06-23 ENCOUNTER — Telehealth: Payer: Self-pay | Admitting: Cardiology

## 2014-06-23 NOTE — Telephone Encounter (Signed)
New message    Son calling from [place]   Can he take the card from her sleep machine take it to [facility]   Calling for sleep study results.

## 2014-06-23 NOTE — Telephone Encounter (Signed)
Spoke with pt and made aware that they can bring [facility] to [facility] but it is important for insurance purposes to make sure [name] does come to her scheduled f/u appt with Dr Mayford Knife

## 2014-07-13 ENCOUNTER — Encounter (HOSPITAL_COMMUNITY): Payer: Medicare Other

## 2014-07-16 ENCOUNTER — Encounter: Payer: Self-pay | Admitting: Podiatrist

## 2014-07-16 ENCOUNTER — Ambulatory Visit (INDEPENDENT_AMBULATORY_CARE_PROVIDER_SITE_OTHER): Payer: Medicare Other | Admitting: Podiatrist

## 2014-07-16 VITALS — BP 112/86 | HR 69 | Resp 12

## 2014-07-16 DIAGNOSIS — L97509 Non-pressure chronic ulcer of other part of unspecified foot with unspecified severity: Secondary | ICD-10-CM

## 2014-07-16 DIAGNOSIS — E1149 Type 2 diabetes mellitus with other diabetic neurological complication: Secondary | ICD-10-CM

## 2014-07-16 DIAGNOSIS — L97512 Non-pressure chronic ulcer of other part of right foot with fat layer exposed: Secondary | ICD-10-CM

## 2014-07-16 MED ORDER — LEVOFLOXACIN 500 MG PO TABS: 500.0000 mg | ORAL_TABLET | Freq: Every day | ORAL | Status: DC

## 2014-07-16 MED ORDER — CLINDAMYCIN HCL 300 MG PO CAPS: 300.0000 mg | ORAL_CAPSULE | Freq: Three times a day (TID) | ORAL | Status: DC

## 2014-07-16 NOTE — Progress Notes (Signed)
Patient presents today for follow up of ulceration submet 1 of the left foot and for a new sore on the tip of the right 3rd toe.  She has been applying silvadene cream to the ulceration and states that it recently started draining.  She states the area is painful and the tip of the right 3rd to is also painful.   Objective: Neurovascular status unchanged. Pulses are palpable and neurological sensation is decreased.  Relapsing ulcer present submet 1 of the left foot. It appears that there is a blister that formed beneath the original ulceration and has been draining clear fluid. She also has an ulceration on the distal tip of the right third toe which is full thickness. There is some redness and swelling to the right third toe itself. Otherwise no streaking, no lymphangitis is seen.   Assessment: Ulceration submet 1 of the left foot , new ulcer distal tip of right 3rd toe.  Plan: Debrided both ulcerations to the level of viable tissue. Applied Iodosorb and a dressing. Dispensed a Darco shoe and prescriptions for clindamycin and Levaquin antibiotics. I will see her back for followup and recheck of the ulcerations and will likely need to modify the Darco shoe at that time.

## 2014-07-16 NOTE — Patient Instructions (Signed)
Soak Instructions     Place 1/4 cup of epsom salts in a quart of warm tap water.  soak in the solution for 20 minutes.    Next, remove your foot or feet from solution, blot dry the affected area and cover.  Apply neosoporin and a bandage to the area

## 2014-07-20 ENCOUNTER — Telehealth: Payer: Self-pay | Admitting: *Deleted

## 2014-07-20 NOTE — Telephone Encounter (Signed)
I'm calling regarding my mom.  Dr. Irving Shows put her on 2 antibiotics.  I'm questioning the need for 2 antibiotics because it causes diarrhea and she has colon issues anyway.  If you can call me and let me know why she needs to be on 2 antibiotics.  Please leave a message because I am a [employer], [employer]/film[employer]video.

## 2014-07-21 ENCOUNTER — Encounter: Payer: Self-pay | Admitting: Cardiology

## 2014-07-21 NOTE — Telephone Encounter (Signed)
She was placed on the 2 antibiotics due to the severity of the infection-- and risk for bone infection.  She can decrease the clindamycin as that is the one that is probably making her have stomach issues.  Tell her to just take it twice daily instead of 3 times a day.

## 2014-07-21 NOTE — Progress Notes (Signed)
Patient ID: [name], female   DOB: [birth year], 80 y.o.   MRN: [id number]  Primary Physician: Dr Pete Glatter  Primary Cardiologist: Dr Mayford Knife   HPI: Mrs Rayson is an 80 year old with a history of diastolic CHF EF 55-60% 12/2013, HTN, HLD, DM, MR and AS, chronic A-fib. OSA on CPAP. She was admitted 3/15 with volume overload. She was placed on lasix drip and later transitioned to lasix 40 mg twice a day. Discharge weight at that time was 161 pounds. She was discharged to CIR. While at CIR she developed hyponatremia and nephrology was consulted with recommendations to cut lasix back to 20 mg twice a day and restrict fluids. She was discharged to home from [facility] 01/30/14 with weight 160 pounds. She was readmitted due to fall and recurrent HF on 02/03/14. Diuresed with IV lasix. and transitioned to lasix 40 mg bid. Discharge weight 148 pounds.   Follow up for Heart Failure: Doing good. Denies SOB, PND, orthopnea or CP. Walking in the house but is not that active, rides in a wheelchair when she is out. Oxygen at night dropping to the 70-80s. SBP 90-115. No longer followed by [facility]. Wears CPAP at night. Followed by [facility] 24 hrs/day. Weight at home 150-152 lbs.   FH: Mother, Father, Brother had heart disease  SH: Widowed. Lives at home alone but has 24 hour care givers. Retired from [employer] She does smoker or drink alcohol  Labs 02/10/14 K 3.8 Creatinine 1.5   ROS: All systems negative except as listed in HPI, PMH and Problem List.  Past Medical History  Diagnosis Date  . Hypertension   . Peripheral neuropathy   . Asthma   . High cholesterol   . Diabetes with neurologic complications   . PAD (peripheral artery disease)   . Enlarged heart   . Anginal pain 05/28/12  . Pneumonia 11/2010  . Sleep apnea     cpap  . Hypothyroidism   . Headache(784.0) 05/29/12    "recently"  . Stroke 05/2005; 08/2010    !mini"  . Personal history of gout   . Hypothyroidism   . DJD (degenerative joint  disease)   . Carpal tunnel syndrome, bilateral   . Recurrent labyrinthitis   . GERD (gastroesophageal reflux disease)     S/P esophageal dilation in 1996  . Gallstone     Silent  . Fibrocystic breast changes   . Hemorrhoids     with bleeding  . Hx of adenomatous colonic polyps     Dr Sherin Quarry  . Diverticulitis     left side  . MR (mitral regurgitation)     moderate  . Mild anemia     hemoglobin 11.8 on 06/2009  . Edema of lower extremity     Chronic  . Ischemic colitis 8/13  . Aortic stenosis, moderate   . Chronic diastolic CHF (congestive heart failure)   . Moderate mitral regurgitation   . CKD (chronic kidney disease) stage 3, GFR 30-59 ml/min     "something on labs always say she has some problems"  . Chronic atrial fibrillation     Current Outpatient Prescriptions  Medication Sig Dispense Refill  . acetaminophen (TYLENOL) 500 MG tablet Take 500 mg by mouth every 6 (six) hours as needed for pain.      Marland Kitchen allopurinol (ZYLOPRIM) 300 MG tablet Take 1 tablet (300 mg total) by mouth daily.  60 tablet  1  . apixaban (ELIQUIS) 2.5 MG TABS tablet Take 1 tablet (2.5  mg total) by mouth 2 (two) times daily.  60 tablet  0  . atorvastatin (LIPITOR) 10 MG tablet Take 1 tablet (10 mg total) by mouth daily.  30 tablet  1  . Calcium Carbonate-Vitamin D (CALCIUM-VITAMIN D) 600-200 MG-UNIT CAPS Take 1 tablet by mouth daily.       . clindamycin (CLEOCIN) 300 MG capsule Take 1 capsule (300 mg total) by mouth 3 (three) times daily.  30 capsule  0  . furosemide (LASIX) 40 MG tablet Take 40 mg by mouth 2 (two) times daily. Takes second tablet every other day      . gabapentin (NEURONTIN) 300 MG capsule Take 1 capsule (300 mg total) by mouth at bedtime.  30 capsule  1  . KLOR-CON M10 10 MEQ tablet TAKE 2 TABLETS BY MOUTH TWICE DAILY  120 tablet  3  . Lancets (FREESTYLE) lancets       . levofloxacin (LEVAQUIN) 500 MG tablet Take 1 tablet (500 mg total) by mouth daily.  10 tablet  0  . levothyroxine  (SYNTHROID, LEVOTHROID) 100 MCG tablet Take 112 mcg by mouth daily before breakfast.       . metoprolol succinate (TOPROL-XL) 25 MG 24 hr tablet Take 0.5 tablets (12.5 mg total) by mouth daily.  15 tablet  1  . Multiple Vitamins-Minerals (MULTIVITAMIN WITH MINERALS) tablet Take 1 tablet by mouth daily.      . Omega-3 Fatty Acids (FISH OIL) 1000 MG CAPS Take 1,000 mg by mouth daily.       . ONE TOUCH ULTRA TEST test strip        No current facility-administered medications for this encounter.     Filed Vitals:   07/22/14 1149  BP: 111/70  Pulse: 78  Resp: 18  Weight: 157 lb 8 oz (71.442 kg)  SpO2: 100%   Physical Exam:  General: Chronically ill appearing. No resp difficulty . Sitting wheelchair Son and caregiver present  HEENT: normal  Neck: supple. JVP 6-7. Carotids 2+ bilat; no bruits. No lymphadenopathy or thryomegaly appreciated.  Cor: PMI nondisplaced. Irregular rhythm & regular rhythm. No rubs, gallops. TR.  Lungs: clear  Abdomen: soft, nontender, non distended. No hepatosplenomegaly. No bruits or masses. Good bowel sounds.  Extremities: no cyanosis, clubbing, rash, edema   Neuro: alert & orientedx3, cranial nerves grossly intact. moves all 4 extremities w/o difficulty. Affect pleasant   ASSESSMENT & PLAN:  1. Chronic Diastolic Heart Failure - NYHA III symptoms and volume status stable. Will continue lasix 40 mg BID.  - SBP stable, will continue Toprol 12.5 mg BID. - Reinforced the need and importance of daily weights, a low sodium diet, and fluid restriction (less than 2 L a day). Instructed to call the HF clinic if weight increases more than 3 lbs overnight or 5 lbs in a week.  2. Chronic A fib - Rate controlled on Toprol. Continue eliquis 2.5 mg twice a day (Age and Creatinine >1.5). Not having any bleeding issues.  3. DNR- - She is a DNR and has the Gold form at home. They have discussed Hospice in the past but feel as if she is doing well right now. Also discussed if  they are interested in Palliative Care to call us and we could refer her to [facility].  4. CKD stage IV: Baseline creatinine 1.3-1.5. Will check BMET today.   5. OSA - she wears her CPAP nightly and encouraged her to continue to wear.  6. Hypoxemia - Caregiver reports O2 drops  to the 70-80s at night. Will order overnight pulse oximetry and if she qualifies will order home O2 for night.   Patient and family would like to cut down on how many appointments she is going to. She has been stable and out of the hospital for awhile now. Will refer back to Dr. Mayford Knife and if at anytime Dr. Mayford Knife or family feels like she needs to come back to HF clinic than we will see her.   F/U PRN Ulla Potash B NP-C

## 2014-07-21 NOTE — Telephone Encounter (Signed)
I left her a message that Dr. Irving Shows said she prescribed 2 antibiotics due to the severity of the infection, does not want to take the risk of bone infection setting in. She said to have her decrease Clindamycin, can take it twice a day rather than 3 times a day.  She stated this is most likely the one that is causing the stomach issues.  Please call if you have any further questions or concerns.

## 2014-07-22 ENCOUNTER — Encounter (HOSPITAL_COMMUNITY): Payer: Self-pay

## 2014-07-22 ENCOUNTER — Encounter: Payer: Self-pay | Admitting: Cardiology

## 2014-07-22 ENCOUNTER — Ambulatory Visit (HOSPITAL_COMMUNITY)
Admission: RE | Admit: 2014-07-22 | Discharge: 2014-07-22 | Disposition: A | Discharge status: Home / Self Care | Payer: Medicare Other | Source: Ambulatory Visit | Attending: Internal Medicine | Admitting: Internal Medicine

## 2014-07-22 ENCOUNTER — Ambulatory Visit (INDEPENDENT_AMBULATORY_CARE_PROVIDER_SITE_OTHER): Payer: Medicare Other | Admitting: Cardiology

## 2014-07-22 VITALS — BP 113/71 | HR 90 | Ht 66.0 in | Wt 156.0 lb

## 2014-07-22 VITALS — BP 111/70 | HR 78 | Resp 18 | Wt 157.5 lb

## 2014-07-22 DIAGNOSIS — R0902 Hypoxemia: Secondary | ICD-10-CM | POA: Diagnosis not present

## 2014-07-22 DIAGNOSIS — G4733 Obstructive sleep apnea (adult) (pediatric): Secondary | ICD-10-CM

## 2014-07-22 DIAGNOSIS — Q211 Atrial septal defect, unspecified: Secondary | ICD-10-CM

## 2014-07-22 DIAGNOSIS — I5030 Unspecified diastolic (congestive) heart failure: Secondary | ICD-10-CM

## 2014-07-22 DIAGNOSIS — I340 Nonrheumatic mitral (valve) insufficiency: Secondary | ICD-10-CM

## 2014-07-22 DIAGNOSIS — I4891 Unspecified atrial fibrillation: Secondary | ICD-10-CM

## 2014-07-22 DIAGNOSIS — I482 Chronic atrial fibrillation, unspecified: Secondary | ICD-10-CM

## 2014-07-22 DIAGNOSIS — Z79899 Other long term (current) drug therapy: Secondary | ICD-10-CM | POA: Insufficient documentation

## 2014-07-22 DIAGNOSIS — E039 Hypothyroidism, unspecified: Secondary | ICD-10-CM | POA: Diagnosis not present

## 2014-07-22 DIAGNOSIS — I739 Peripheral vascular disease, unspecified: Secondary | ICD-10-CM | POA: Diagnosis not present

## 2014-07-22 DIAGNOSIS — E1159 Type 2 diabetes mellitus with other circulatory complications: Secondary | ICD-10-CM | POA: Insufficient documentation

## 2014-07-22 DIAGNOSIS — I359 Nonrheumatic aortic valve disorder, unspecified: Secondary | ICD-10-CM

## 2014-07-22 DIAGNOSIS — I350 Nonrheumatic aortic (valve) stenosis: Secondary | ICD-10-CM

## 2014-07-22 DIAGNOSIS — I5032 Chronic diastolic (congestive) heart failure: Secondary | ICD-10-CM | POA: Diagnosis not present

## 2014-07-22 DIAGNOSIS — I129 Hypertensive chronic kidney disease with stage 1 through stage 4 chronic kidney disease, or unspecified chronic kidney disease: Secondary | ICD-10-CM | POA: Diagnosis not present

## 2014-07-22 DIAGNOSIS — I10 Essential (primary) hypertension: Secondary | ICD-10-CM

## 2014-07-22 DIAGNOSIS — I798 Other disorders of arteries, arterioles and capillaries in diseases classified elsewhere: Secondary | ICD-10-CM | POA: Diagnosis not present

## 2014-07-22 DIAGNOSIS — Z8673 Personal history of transient ischemic attack (TIA), and cerebral infarction without residual deficits: Secondary | ICD-10-CM | POA: Insufficient documentation

## 2014-07-22 DIAGNOSIS — K219 Gastro-esophageal reflux disease without esophagitis: Secondary | ICD-10-CM | POA: Diagnosis not present

## 2014-07-22 DIAGNOSIS — Z66 Do not resuscitate: Secondary | ICD-10-CM

## 2014-07-22 DIAGNOSIS — N184 Chronic kidney disease, stage 4 (severe): Secondary | ICD-10-CM | POA: Insufficient documentation

## 2014-07-22 DIAGNOSIS — I059 Rheumatic mitral valve disease, unspecified: Secondary | ICD-10-CM

## 2014-07-22 DIAGNOSIS — Q2111 Secundum atrial septal defect: Secondary | ICD-10-CM

## 2014-07-22 LAB — BASIC METABOLIC PANEL
Anion gap: 12 (ref 5–15)
BUN: 27 mg/dL — ABNORMAL HIGH (ref 6–23)
CO2: 29 mEq/L (ref 19–32)
Calcium: 9.5 mg/dL (ref 8.4–10.5)
Chloride: 95 mEq/L — ABNORMAL LOW (ref 96–112)
Creatinine, Ser: 1.28 mg/dL — ABNORMAL HIGH (ref 0.50–1.10)
GFR calc Af Amer: 42 mL/min — ABNORMAL LOW (ref >90)
GFR, EST NON AFRICAN AMERICAN: 36 mL/min — AB (ref >90)
GLUCOSE: 76 mg/dL (ref 70–99)
POTASSIUM: 4.8 meq/L (ref 3.7–5.3)
SODIUM: 136 meq/L — AB (ref 137–147)

## 2014-07-22 LAB — PRO B NATRIURETIC PEPTIDE: PRO B NATRI PEPTIDE: 1209 pg/mL — AB (ref 0–450)

## 2014-07-22 NOTE — Progress Notes (Signed)
[address] [place], [place]  [address] Phone: [phone] Fax:  [phone]  Date:  07/22/2014   ID:  [name], DOB January 09, 1924, MRN [id number]  PCP:  Ginette Otto, MD  Cardiologist:  Armanda Magic, MD    History of Present Illness: [name] is a 80 y.o. female with a history of chronic diastolic CHF EF 55-60%, OSA on CPAP, HTN, chronic atrial fibrillation, moderate AS, mild pulmonary HTN, moderate MR and ASD who presents today for followup. Since I saw her last she has had several admits for CHF and fall.  She was diuresed with IV Lasix.  Her dry weight is around 150lbs.  She has NYHA class III symptoms.  She is doing well today. She denies any chest pain, palpitations or syncope. She has chronic LE edema which is stable.  She uses her CPAP and does well with it. She tolerates her nasal mask and pressure feels adequate. She usually feels rested in the am and takes a nap after lunch.     Wt Readings from Last 3 Encounters:  07/22/14 156 lb (70.761 kg)  07/22/14 157 lb 8 oz (71.442 kg)  05/18/14 152 lb 12.8 oz (69.31 kg)     Past Medical History  Diagnosis Date  . Hypertension   . Peripheral neuropathy   . Asthma   . High cholesterol   . Diabetes with neurologic complications   . PAD (peripheral artery disease)   . Enlarged heart   . Anginal pain 05/28/12  . Pneumonia 11/2010  . Sleep apnea     cpap  . Hypothyroidism   . Headache(784.0) 05/29/12    "recently"  . Stroke 05/2005; 08/2010    !mini"  . Personal history of gout   . Hypothyroidism   . DJD (degenerative joint disease)   . Carpal tunnel syndrome, bilateral   . Recurrent labyrinthitis   . GERD (gastroesophageal reflux disease)     S/P esophageal dilation in 1996  . Gallstone     Silent  . Fibrocystic breast changes   . Hemorrhoids     with bleeding  . Hx of adenomatous colonic polyps     Dr Sherin Quarry  . Diverticulitis     left side  . MR (mitral regurgitation)    moderate  . Mild anemia     hemoglobin 11.8 on 06/2009  . Edema of lower extremity     Chronic  . Ischemic colitis 8/13  . Aortic stenosis, moderate   . Chronic diastolic CHF (congestive heart failure)   . Moderate mitral regurgitation   . CKD (chronic kidney disease) stage 3, GFR 30-59 ml/min     "something on labs always say she has some problems"  . Chronic atrial fibrillation     Current Outpatient Prescriptions  Medication Sig Dispense Refill  . acetaminophen (TYLENOL) 500 MG tablet Take 500 mg by mouth every 6 (six) hours as needed for pain.      Marland Kitchen allopurinol (ZYLOPRIM) 300 MG tablet Take 1 tablet (300 mg total) by mouth daily.  60 tablet  1  . apixaban (ELIQUIS) 2.5 MG TABS tablet Take 1 tablet (2.5 mg total) by mouth 2 (two) times daily.  60 tablet  0  . atorvastatin (LIPITOR) 10 MG tablet Take 1 tablet (10 mg total) by mouth daily.  30 tablet  1  . Calcium Carbonate-Vitamin D (CALCIUM-VITAMIN D) 600-200 MG-UNIT CAPS Take 1 tablet by mouth daily.       . clindamycin (  CLEOCIN) 300 MG capsule Take 1 capsule (300 mg total) by mouth 3 (three) times daily.  30 capsule  0  . furosemide (LASIX) 40 MG tablet Take 40 mg by mouth 2 (two) times daily. Takes second tablet every other day      . gabapentin (NEURONTIN) 300 MG capsule Take 1 capsule (300 mg total) by mouth at bedtime.  30 capsule  1  . KLOR-CON M10 10 MEQ tablet TAKE 2 TABLETS BY MOUTH TWICE DAILY  120 tablet  3  . Lancets (FREESTYLE) lancets       . levofloxacin (LEVAQUIN) 500 MG tablet Take 1 tablet (500 mg total) by mouth daily.  10 tablet  0  . levothyroxine (SYNTHROID, LEVOTHROID) 100 MCG tablet Take 112 mcg by mouth daily before breakfast.       . metoprolol succinate (TOPROL-XL) 25 MG 24 hr tablet Take 0.5 tablets (12.5 mg total) by mouth daily.  15 tablet  1  . Multiple Vitamins-Minerals (MULTIVITAMIN WITH MINERALS) tablet Take 1 tablet by mouth daily.      . Omega-3 Fatty Acids (FISH OIL) 1000 MG CAPS Take 1,000 mg by  mouth daily.       . ONE TOUCH ULTRA TEST test strip        No current facility-administered medications for this visit.    Allergies:    Allergies  Allergen Reactions  . Aspirin Other (See Comments)    Tears my stomach up; "can take coated Aspirin qd without problems"  . Ambien [Zolpidem Tartrate]     Pt does not wakeup from [place]  . Nexium [Esomeprazole Magnesium] Rash  . Penicillins Rash    Tolerates Rocephin    Social History:  The patient  reports that she has never smoked. She has never used smokeless tobacco. She reports that she does not drink alcohol or use illicit drugs.   Family History:  The patient's family history includes Heart disease in her brother, father, and mother.   ROS:  Please see the history of present illness.      All other systems reviewed and negative.   PHYSICAL EXAM: VS:  BP 113/71  Pulse 90  Ht  (1.676 m)  Wt 156 lb (70.761 kg)  BMI 25.19 kg/m2 Well nourished, well developed, in no acute distress HEENT: normal Neck: no JVD Cardiac:  normal S1, S2; RRR; no murmur Lungs:  clear to auscultation bilaterally, no wheezing, rhonchi or rales Abd: soft, nontender, no hepatomegaly Ext: trace edema Skin: warm and dry Neuro:  CNs 2-12 intact, no focal abnormalities noted      ASSESSMENT AND PLAN:  1. Chronic diastolic CHF NYHA class III symptoms - continue metoprolol/Lasix  - she is on a fluid restriction < 2L daily and low sodium diet which I stressed again the importance of following.  She knows to call if she gains more 3 lbs in one day for 5lbs in a week.  Her weight is stable around 150lbs. 2. HTN - well controlled - continue metoprolol  3. ASD 4. Chronic atrial fibrillation rate controlled - continue apixaban/metoprolol/amiodarone  5. OSA on CPAP - her d/l today showed an AHI of 5.7/hr on 11cm H2O and 72% compliant in using more than 4 hours nightly. I am going to increase her CPAP to 12cm H2O and recheck a download in 4  weeks. 6. Moderate AS 7. Moderate MR - She is asymptomatic.  8. Mild pulmonary HTN secondary to diastolic dysfunction/MR/AS/OSA  Followup with me in 6 months  Signed, Armanda Magic, MD [facility] 07/22/2014 2:35 PM

## 2014-07-22 NOTE — Patient Instructions (Signed)
Doing great.  Call if you guys need anything but will release back to Dr. Mayford Knife  Will order pulse oximetry at night.  Do the following things EVERYDAY: 1) Weigh yourself in the morning before breakfast. Write it down and keep it in a log. 2) Take your medicines as prescribed 3) Eat low salt foods-Limit salt (sodium) to 2000 mg per day.  4) Stay as active as you can everyday 5) Limit all fluids for the day to less than 2 liters 6)

## 2014-07-22 NOTE — Patient Instructions (Signed)
Your physician has recommended you make the following change to your CPAP machine. Increase pressure to 12 and we will have [facility] send [place] a download after 4 weeks of use.   Your physician wants you to follow-up in: 6 months with Dr Sherlyn Lick will receive a reminder letter in the mail two months in advance. If you don't receive a letter, please call our office to schedule the follow-up appointment.

## 2014-07-25 ENCOUNTER — Other Ambulatory Visit (HOSPITAL_COMMUNITY): Payer: Self-pay | Admitting: Internal Medicine

## 2014-07-27 ENCOUNTER — Telehealth: Payer: Self-pay | Admitting: Cardiology

## 2014-07-27 NOTE — Telephone Encounter (Signed)
New message     Dr Mayford Knife wanted [facility] to increase the pressure on her cpap machine.  They have not received an order from Dr Mayford Knife to do so.  Please call [facility] and then call Mr Blethen to let him know it has been done.

## 2014-07-27 NOTE — Telephone Encounter (Signed)
Sent request over to [facility] to see why Pt had not been contacted yet.

## 2014-07-28 NOTE — Telephone Encounter (Signed)
Spoke with Tamela OddiBetsy and she will make sure order is sent in to correct person so they can increase pressure. Made pts son aware.

## 2014-07-29 ENCOUNTER — Encounter (HOSPITAL_COMMUNITY): Payer: Self-pay | Admitting: Emergency Medicine

## 2014-07-29 ENCOUNTER — Emergency Department (HOSPITAL_COMMUNITY): Payer: Medicare Other

## 2014-07-29 ENCOUNTER — Emergency Department (HOSPITAL_COMMUNITY)
Admission: EM | Admit: 2014-07-29 | Discharge: 2014-07-29 | Disposition: A | Discharge status: Home / Self Care | Payer: Medicare Other | Attending: Emergency Medicine | Admitting: Emergency Medicine

## 2014-07-29 DIAGNOSIS — G609 Hereditary and idiopathic neuropathy, unspecified: Secondary | ICD-10-CM | POA: Insufficient documentation

## 2014-07-29 DIAGNOSIS — Z792 Long term (current) use of antibiotics: Secondary | ICD-10-CM | POA: Diagnosis not present

## 2014-07-29 DIAGNOSIS — I129 Hypertensive chronic kidney disease with stage 1 through stage 4 chronic kidney disease, or unspecified chronic kidney disease: Secondary | ICD-10-CM | POA: Insufficient documentation

## 2014-07-29 DIAGNOSIS — E1149 Type 2 diabetes mellitus with other diabetic neurological complication: Secondary | ICD-10-CM | POA: Insufficient documentation

## 2014-07-29 DIAGNOSIS — I5032 Chronic diastolic (congestive) heart failure: Secondary | ICD-10-CM | POA: Insufficient documentation

## 2014-07-29 DIAGNOSIS — Z7902 Long term (current) use of antithrombotics/antiplatelets: Secondary | ICD-10-CM | POA: Insufficient documentation

## 2014-07-29 DIAGNOSIS — Y9289 Other specified places as the place of occurrence of the external cause: Secondary | ICD-10-CM | POA: Diagnosis not present

## 2014-07-29 DIAGNOSIS — J45909 Unspecified asthma, uncomplicated: Secondary | ICD-10-CM | POA: Insufficient documentation

## 2014-07-29 DIAGNOSIS — Z860100 Personal history of colon polyps, unspecified: Secondary | ICD-10-CM | POA: Insufficient documentation

## 2014-07-29 DIAGNOSIS — Z862 Personal history of diseases of the blood and blood-forming organs and certain disorders involving the immune mechanism: Secondary | ICD-10-CM | POA: Insufficient documentation

## 2014-07-29 DIAGNOSIS — N183 Chronic kidney disease, stage 3 unspecified: Secondary | ICD-10-CM | POA: Diagnosis not present

## 2014-07-29 DIAGNOSIS — Z8673 Personal history of transient ischemic attack (TIA), and cerebral infarction without residual deficits: Secondary | ICD-10-CM | POA: Diagnosis not present

## 2014-07-29 DIAGNOSIS — Z8742 Personal history of other diseases of the female genital tract: Secondary | ICD-10-CM | POA: Diagnosis not present

## 2014-07-29 DIAGNOSIS — W19XXXA Unspecified fall, initial encounter: Secondary | ICD-10-CM

## 2014-07-29 DIAGNOSIS — Z8739 Personal history of other diseases of the musculoskeletal system and connective tissue: Secondary | ICD-10-CM | POA: Diagnosis not present

## 2014-07-29 DIAGNOSIS — Z8669 Personal history of other diseases of the nervous system and sense organs: Secondary | ICD-10-CM | POA: Diagnosis not present

## 2014-07-29 DIAGNOSIS — K5732 Diverticulitis of large intestine without perforation or abscess without bleeding: Secondary | ICD-10-CM | POA: Diagnosis not present

## 2014-07-29 DIAGNOSIS — M109 Gout, unspecified: Secondary | ICD-10-CM | POA: Diagnosis not present

## 2014-07-29 DIAGNOSIS — Y9389 Activity, other specified: Secondary | ICD-10-CM | POA: Diagnosis not present

## 2014-07-29 DIAGNOSIS — W010XXA Fall on same level from slipping, tripping and stumbling without subsequent striking against object, initial encounter: Secondary | ICD-10-CM | POA: Insufficient documentation

## 2014-07-29 DIAGNOSIS — Z043 Encounter for examination and observation following other accident: Secondary | ICD-10-CM | POA: Diagnosis present

## 2014-07-29 DIAGNOSIS — E7800 Pure hypercholesterolemia, unspecified: Secondary | ICD-10-CM | POA: Diagnosis not present

## 2014-07-29 DIAGNOSIS — Z79899 Other long term (current) drug therapy: Secondary | ICD-10-CM | POA: Diagnosis not present

## 2014-07-29 DIAGNOSIS — Z8701 Personal history of pneumonia (recurrent): Secondary | ICD-10-CM | POA: Insufficient documentation

## 2014-07-29 DIAGNOSIS — Z880 Allergy status to penicillin: Secondary | ICD-10-CM | POA: Diagnosis not present

## 2014-07-29 DIAGNOSIS — E039 Hypothyroidism, unspecified: Secondary | ICD-10-CM | POA: Insufficient documentation

## 2014-07-29 NOTE — Discharge Instructions (Signed)

## 2014-07-29 NOTE — ED Notes (Signed)
Pt reports to the ED for eval of left hip pain following a fall that occurred PTA. Pt reports she was walking inside when she tripped and fell on her left hip. She is on Eloquis. Denies any head injury or LOC. Pt in rate controlled A. Fib with a RBBB which is baseline. No swelling, ecchymosis, or obvious deformity noted to hip. No shortening or external rotation noted. CMS intact. Full ROM limited by pain. Pt A&Ox4, resp e/u, and skin warm and dry.

## 2014-07-29 NOTE — ED Provider Notes (Signed)
CSN: [id number]     Arrival date & time 07/29/14  1727 History   First MD Initiated Contact with Patient 07/29/14 1737     Chief Complaint  Patient presents with  . Fall     (Consider location/radiation/quality/duration/timing/severity/associated sxs/prior Treatment) HPI 80 year old female presents after falling today. She did not hit her head or neck. Today is the first day she's been walking more than just a few steps and she had a procedure on her left foot by her podiatrist. She thinks her left leg gave out on her when she is putting too much pressure on it today. She landed on her left hip and initially had some pain but states now there is no pain or tenderness at all. Denies any other symptoms such as lightheadedness or dizziness.  Past Medical History  Diagnosis Date  . Hypertension   . Peripheral neuropathy   . Asthma   . High cholesterol   . Diabetes with neurologic complications   . PAD (peripheral artery disease)   . Enlarged heart   . Anginal pain 05/28/12  . Pneumonia 11/2010  . Sleep apnea     cpap  . Hypothyroidism   . Headache(784.0) 05/29/12    "recently"  . Stroke 05/2005; 08/2010    !mini"  . Personal history of gout   . Hypothyroidism   . DJD (degenerative joint disease)   . Carpal tunnel syndrome, bilateral   . Recurrent labyrinthitis   . GERD (gastroesophageal reflux disease)     S/P esophageal dilation in 1996  . Gallstone     Silent  . Fibrocystic breast changes   . Hemorrhoids     with bleeding  . Hx of adenomatous colonic polyps     Dr Sherin QuarryWeissman  . Diverticulitis     left side  . MR (mitral regurgitation)     moderate  . Mild anemia     hemoglobin 11.8 on 06/2009  . Edema of lower extremity     Chronic  . Ischemic colitis 8/13  . Aortic stenosis, moderate   . Chronic diastolic CHF (congestive heart failure)   . Moderate mitral regurgitation   . CKD (chronic kidney disease) stage 3, GFR 30-59 ml/min     "something on labs always say she  has some problems"  . Chronic atrial fibrillation    Past Surgical History  Procedure Laterality Date  . Tonsillectomy and adenoidectomy  1960's  . Dilation and curettage of uterus    . Vaginal hysterectomy  1970's  . Total knee arthroplasty  1970-~2002    left; right  . Cataract extraction w/ intraocular lens  implant, bilateral  ?1970's   Family History  Problem Relation Age of Onset  . Heart disease Mother   . Heart disease Father   . Heart disease Brother    History  Substance Use Topics  . Smoking status: Never Smoker   . Smokeless tobacco: Never Used  . Alcohol Use: No   OB History   Grav Para Term Preterm Abortions TAB SAB Ect Mult Living                 Review of Systems  Musculoskeletal: Positive for arthralgias.  Neurological: Negative for weakness and numbness.  All other systems reviewed and are negative.     Allergies  Ambien; Aspirin; Nexium; and Penicillins  Home Medications   Prior to Admission medications   Medication Sig Start Date End Date Taking? Authorizing Provider  acetaminophen (TYLENOL) 500 MG tablet Take  500 mg by mouth every 6 (six) hours as needed for pain.    Historical Provider, MD  allopurinol (ZYLOPRIM) 300 MG tablet Take 1 tablet (300 mg total) by mouth daily. 01/30/14   Mcarthur Rossetti Angiulli, PA-C  apixaban (ELIQUIS) 2.5 MG TABS tablet Take 1 tablet (2.5 mg total) by mouth 2 (two) times daily. 02/08/14   Shanker Levora Dredge, MD  atorvastatin (LIPITOR) 10 MG tablet Take 1 tablet (10 mg total) by mouth daily. 01/30/14   Mcarthur Rossetti Angiulli, PA-C  Calcium Carbonate-Vitamin D (CALCIUM-VITAMIN D) 600-200 MG-UNIT CAPS Take 1 tablet by mouth daily.     Historical Provider, MD  clindamycin (CLEOCIN) 300 MG capsule Take 1 capsule (300 mg total) by mouth 3 (three) times daily. 07/16/14   Delories Heinz, DPM  furosemide (LASIX) 40 MG tablet Take 40 mg by mouth 2 (two) times daily. Takes second tablet every other day 02/08/14   Maretta Bees, MD   gabapentin (NEURONTIN) 300 MG capsule Take 1 capsule (300 mg total) by mouth at bedtime. 01/30/14   Mcarthur Rossetti Angiulli, PA-C  KLOR-CON M10 10 MEQ tablet TAKE 2 TABLETS BY MOUTH TWICE DAILY 07/27/14   Dolores Patty, MD  Lancets (FREESTYLE) lancets  05/15/14   Historical Provider, MD  levofloxacin (LEVAQUIN) 500 MG tablet Take 1 tablet (500 mg total) by mouth daily. 07/16/14   Delories Heinz, DPM  levothyroxine (SYNTHROID, LEVOTHROID) 100 MCG tablet Take 112 mcg by mouth daily before breakfast.     Historical Provider, MD  metoprolol succinate (TOPROL-XL) 25 MG 24 hr tablet Take 0.5 tablets (12.5 mg total) by mouth daily. 01/30/14   Mcarthur Rossetti Angiulli, PA-C  Multiple Vitamins-Minerals (MULTIVITAMIN WITH MINERALS) tablet Take 1 tablet by mouth daily.    Historical Provider, MD  Omega-3 Fatty Acids (FISH OIL) 1000 MG CAPS Take 1,000 mg by mouth daily.     Historical Provider, MD  ONE TOUCH ULTRA TEST test strip  05/15/14   Historical Provider, MD   BP 119/68  Pulse 99  Temp(Src) 98 F (36.7 C) (Oral)  Resp 17  SpO2 90% Physical Exam  Nursing note and vitals reviewed. Constitutional: She is oriented to person, place, and time. She appears well-developed and well-nourished.  HENT:  Head: Normocephalic and atraumatic.  Right Ear: External ear normal.  Left Ear: External ear normal.  Nose: Nose normal.  Eyes: Right eye exhibits no discharge. Left eye exhibits no discharge.  Cardiovascular: Normal rate and intact distal pulses.   Pulmonary/Chest: Effort normal and breath sounds normal.  Abdominal: She exhibits no distension.  Musculoskeletal:       Right hip: She exhibits normal range of motion and no tenderness.       Left hip: She exhibits normal range of motion and no tenderness.  Neurological: She is alert and oriented to person, place, and time.  Skin: Skin is warm and dry.    ED Course  Procedures (including critical care time) Labs Review Labs Reviewed - No data to  display  Imaging Review Dg Hip Complete Left  07/29/2014   CLINICAL DATA:  Left hip pain for 1 day after fall.  EXAM: LEFT HIP - COMPLETE 2+ VIEW  COMPARISON:  None.  FINDINGS: The bones appear diffusely osteopenic. No acute fracture or subluxation identified. Mild degenerative changes are noted involving both hips. Vascular calcifications.  IMPRESSION: 1. No acute findings. 2. Mild osteoarthritis.   Electronically Signed   By: Signa Kell M.D.   On: 07/29/2014 19:21  EKG Interpretation   Date/Time:  Wednesday July 29 2014 17:33:44 EDT Ventricular Rate:  100 PR Interval:  186 QRS Duration: 155 QT Interval:  445 QTC Calculation: 574 R Axis:   95 Text Interpretation:  Sinus tachycardia Atrial premature complex RBBB and  LPFB No significant change since last tracing Confirmed by Charli Liberatore  MD,  Xandra Laramee ([id number]) on 07/29/2014 5:58:45 PM      MDM   Final diagnoses:  Fall, initial encounter    80 year old female with a mechanical fall trying to get into her house. Initially had left hip pain but states that she is completely pain-free. Hip x-ray is unremarkable. She is able to get up annually with a limp that she attributed to to her foot which is postop and not her hip. Given this I feel she is stable for discharge and followup as needed.    Audree Camel, MD 07/29/14 [id number]

## 2014-07-31 ENCOUNTER — Encounter: Payer: Self-pay | Admitting: Podiatrist

## 2014-07-31 ENCOUNTER — Ambulatory Visit (INDEPENDENT_AMBULATORY_CARE_PROVIDER_SITE_OTHER): Payer: Medicare Other | Admitting: Podiatrist

## 2014-07-31 VITALS — BP 138/70 | HR 66 | Resp 16

## 2014-07-31 DIAGNOSIS — L97511 Non-pressure chronic ulcer of other part of right foot limited to breakdown of skin: Secondary | ICD-10-CM

## 2014-07-31 MED ORDER — DOXYCYCLINE HYCLATE 100 MG PO TABS: 100.0000 mg | ORAL_TABLET | Freq: Two times a day (BID) | ORAL | Status: DC

## 2014-07-31 NOTE — Progress Notes (Signed)
   Patient presents today for follow up of ulceration submet 1 of the left foot and for ulceration on the tip of the right 3rd toe. She has been applying silvadene cream to the ulcerations.  She states the area is painful and the tip of the right 3rd to is also painful.   Objective: Neurovascular status unchanged. Pulses are palpable and neurological sensation is decreased. The ulceration submetatarsal one of the left foot does appear healed at today's visit. No redness, no sign of infection is present however generalized swelling on the dorsum of the left foot is noted. She also has an ulceration on the distal tip of the right third toe which is full thickness. There is some redness and swelling to the right third toe itself. Redness to the entire right foot is also seen no swelling is noted. No probing to bone is noted.   Assessment: Healed Ulceration submet 1 of the left foot , new ulcer distal tip of right 3rd toe.   Plan: Debrided ulceration to the level of viable tissue. Applied medihoney and a dressing. Dispensed medihoney for her to use at home.  Switched the antibiotic to Doxycycline 100 bid.  I will see her back for followup and recheck of the ulcerations.

## 2014-08-20 ENCOUNTER — Ambulatory Visit
Admission: RE | Admit: 2014-08-20 | Discharge: 2014-08-20 | Disposition: A | Discharge status: Home / Self Care | Payer: Medicare Other | Source: Ambulatory Visit | Attending: Internal Medicine | Admitting: Internal Medicine

## 2014-08-20 ENCOUNTER — Other Ambulatory Visit: Payer: Self-pay | Admitting: Geriatric Medicine

## 2014-08-20 ENCOUNTER — Other Ambulatory Visit: Payer: Self-pay | Admitting: Internal Medicine

## 2014-08-20 DIAGNOSIS — M545 Low back pain: Secondary | ICD-10-CM

## 2014-08-20 DIAGNOSIS — S32000A Wedge compression fracture of unspecified lumbar vertebra, initial encounter for closed fracture: Secondary | ICD-10-CM

## 2014-08-21 ENCOUNTER — Ambulatory Visit: Payer: Medicare Other | Admitting: Podiatrist

## 2014-08-26 ENCOUNTER — Ambulatory Visit
Admission: RE | Admit: 2014-08-26 | Discharge: 2014-08-26 | Disposition: A | Discharge status: Home / Self Care | Payer: Medicare Other | Source: Ambulatory Visit | Attending: Geriatric Medicine | Admitting: Geriatric Medicine

## 2014-08-26 DIAGNOSIS — S32000A Wedge compression fracture of unspecified lumbar vertebra, initial encounter for closed fracture: Secondary | ICD-10-CM

## 2014-08-28 ENCOUNTER — Telehealth (HOSPITAL_COMMUNITY): Payer: Self-pay | Admitting: *Deleted

## 2014-08-28 ENCOUNTER — Ambulatory Visit (INDEPENDENT_AMBULATORY_CARE_PROVIDER_SITE_OTHER): Payer: Medicare Other | Admitting: Podiatrist

## 2014-08-28 ENCOUNTER — Encounter: Payer: Self-pay | Admitting: Podiatrist

## 2014-08-28 VITALS — BP 110/53 | HR 78 | Temp 97.7°F | Resp 17

## 2014-08-28 DIAGNOSIS — L97514 Non-pressure chronic ulcer of other part of right foot with necrosis of bone: Secondary | ICD-10-CM

## 2014-08-28 DIAGNOSIS — R0989 Other specified symptoms and signs involving the circulatory and respiratory systems: Secondary | ICD-10-CM

## 2014-08-31 ENCOUNTER — Telehealth: Payer: Self-pay | Admitting: *Deleted

## 2014-08-31 NOTE — Telephone Encounter (Signed)
Yes, I am happy to make her another appointment so a family member can be there to ask all these questions.    But if you want to relay the following information it may help.  We are getting a circulation study to assess healing potential. This will be done prior to the surgery.  I realize she is on a blood thinner. She will not be taken off of this for this very minor surgery.  Likely she will not be put to sleep with her history.  More likely a small amount of sedation or local only. This will be decided by anesthesiologist at the center.  Procedure is removal of the tip bone of the toe only-- this is the one that is sticking out at the tip of the toe.  Surgery takes 10 minutes for the procedure. Total time at the center probably 3 hours.  Recovery time depends on how quickly she heals.  Sutures stay in for 2-3 weeks and after they are removed she can go back to wearing an enclosed shoe 1-2 weeks after sutures are removed and the toe has healed.  Again, difficult to give a hard time frame as her inability to heal the ulceration is why we are having to perform surgery on the toe in the first place.  If they would like her to see her primary care doctor for a pre operative clearance, let me know and I will send over a letter-- however this will likely delay the surgery.  The toe is not infected at this time so it should be fine.    Thanks!!

## 2014-08-31 NOTE — Telephone Encounter (Signed)
She saw Dr. Irving [name] Friday.  The doctor set up surgery to remove the tip of one of the toes.  There was no family member with [name] at the time.  So, I have a lot of questions I need to ask with her heart problems, being put to sleep, blood thinners, Neurologic approval, what the procedure entails, length of the surgery, the recovery time.  I have lots of questions before we move forward.  Thank you.

## 2014-09-01 ENCOUNTER — Telehealth: Payer: Self-pay | Admitting: *Deleted

## 2014-09-01 NOTE — Telephone Encounter (Signed)
I left her a message that I was returning her call.  Please give me a call back after 2 pm.

## 2014-09-01 NOTE — Telephone Encounter (Signed)
I'm returning Marnie Fazzino's phone call concerning surgery for my mom.  Please give me a call back.  Thank you.  I returned her call.   She stated, "My mom was brought over by assisted living facility.  No family member was present with her at the visit and Dr. Irving ShowsEgerton has scheduled surgery.  I have not information about this whole process.  I'm concerned."  I informed her that Dr. Irving ShowsEgerton said you can come in for an appointment with your mother if you like for a consultation or I can share some information with you from Dr. Irving ShowsEgerton.  She chose to get the information that Dr. Irving ShowsEgerton had written.  She asked if there was anything for her mother to do to prepare for the surgery.  I told her nothing to eat or drink after mid-night the day before.  Someone will need to be there with her.  The surgery is done at [facility] located off [address] in the [place].  Someone from the surgical center will call you the Friday before an let you know exactly what time she needs to be there.  Ask them about medication, whether or not to take it day of surgery.  She stated, "I think that's about it."

## 2014-09-01 NOTE — Progress Notes (Signed)
Patient presents today for follow up of ulceration submet 1 of the left foot and for ulceration on the tip of the right 3rd toe. She has been applying medihoney to the ulcerations. there is a scab on the left foot and  She states the area is painful and the tip of the right 3rd toe.   Objective: Neurovascular status unchanged. Pulses are palpable and neurological sensation is decreased. The ulceration submetatarsal one of the left foot does continue to appear healed at today's visit. No redness, no sign of infection is present however generalized swelling on the dorsum of the left foot is noted. She also has an ulceration on the distal tip of the right third toe which is now down to the level of bone.  The tip of the distal phalanx is exposed. There is some mild redness and swelling to the right third toe itself.   Assessment: Healed Ulceration submet 1 of the left foot ,ulcer down to bone distal tip of right 3rd toe.  Plan:  Discussed conservative versus surgical options. Recommended a distal amputation of the distal phalanx of the right third toe. . The consent form was discussed and all three pages were signed and the patient's questions were encouraged and answered to the best of my ability. Risks of the surgery were discussed including but not limited to continued pain, infection, swelling, bleeding, decreased function, etc. Preoperative instructions were also dispensed to the patient as well as a preoperative surgical pamphlet to go along with the instructions. Surgery will be scheduled at the patients convenience and patient will be seen at [facility] on outpatient basis.The patient is instructed to call if any questions or concerns arise.  Of note, she is on coumadin and I plan to keep her on the medication.

## 2014-09-01 NOTE — Telephone Encounter (Signed)
I called and left her a message to contact the patient's daughter about the surgery of her mother at [phone].

## 2014-09-03 ENCOUNTER — Ambulatory Visit (HOSPITAL_COMMUNITY)
Admission: RE | Admit: 2014-09-03 | Discharge: 2014-09-03 | Disposition: A | Discharge status: Home / Self Care | Payer: Medicare Other | Source: Ambulatory Visit | Attending: Cardiology | Admitting: Cardiology

## 2014-09-03 DIAGNOSIS — L97514 Non-pressure chronic ulcer of other part of right foot with necrosis of bone: Secondary | ICD-10-CM

## 2014-09-03 DIAGNOSIS — R0989 Other specified symptoms and signs involving the circulatory and respiratory systems: Secondary | ICD-10-CM | POA: Diagnosis not present

## 2014-09-03 NOTE — Progress Notes (Signed)
Lower Extremity Arterial Duplex Completed. °Brianna L Mazza,RVT °

## 2014-09-04 ENCOUNTER — Telehealth: Payer: Self-pay | Admitting: *Deleted

## 2014-09-04 DIAGNOSIS — I739 Peripheral vascular disease, unspecified: Secondary | ICD-10-CM

## 2014-09-04 NOTE — Telephone Encounter (Signed)
I called and informed the patient's daughter that we are going to cancel her surgery.  Dr. Irving ShowsEgerton got her doppler results back and they were abnormal.  Dr. Irving ShowsEgerton wants to refer her to Dr. Nanetta BattyJonathan Berry for a consultation.  She stated, "So they will call me to make that appointment?  Is there anything else that I need to do?"   I told her I'm getting ready to send the referral and they should give you a call.  I will take care of cancellation of the surgery.  I asked if I needed to contact the patient.  She stated, "No, yesterday she kept thinking we had an appointment there.  That's why I gave you my number to contact."  I called and informed Aram BeechamCynthia to cancel the surgery because patient has an abnormal doppler result, will need vascularization.

## 2014-09-09 ENCOUNTER — Ambulatory Visit (INDEPENDENT_AMBULATORY_CARE_PROVIDER_SITE_OTHER): Payer: Medicare Other | Admitting: Cardiovascular Disease

## 2014-09-09 ENCOUNTER — Telehealth: Payer: Self-pay

## 2014-09-09 ENCOUNTER — Encounter: Payer: Self-pay | Admitting: Cardiovascular Disease

## 2014-09-09 VITALS — BP 104/60 | HR 84 | Ht 68.0 in | Wt 149.0 lb

## 2014-09-09 DIAGNOSIS — I739 Peripheral vascular disease, unspecified: Secondary | ICD-10-CM

## 2014-09-09 NOTE — Telephone Encounter (Signed)
Spoke with patients daughter regarding follow up appointment with Dr Irving ShowsEgerton. Advised to continue with Medihoney until seen in our office. Patient will be contacted by front office staff to set up an appointment this week or next week for ongoing wound care for her toe. Dr Irving ShowsEgerton is aware of Dr Allyson SabalBerry report

## 2014-09-09 NOTE — Assessment & Plan Note (Signed)
[name] was referred to me by Dr. Irving ShowsEgerton  for peripheral vascular evaluation prior to elective amputation of her right third toe. She has a history of diastolic heart failure with normal ejection fraction, moderate aortic stenosis, moderate mitral regurgitation and chronic atrial fibrillation on oral anticoagulation. She developed a right third toe ulcer at the tip of her toe which has been treated for the last 6 months plans were to perform amputation because of exposed bone. She had Doppler studies performed in our office 09/03/14 revealing a high-grade right common femoral artery stenosis, occluded right popliteal artery as well as all tibial vessels. Her right toe brachial index was 0.2 suggesting that she did not have enough perfusion to heal an amputation wound. At this point, she could potentially have her common femoral artery intervened on percutaneously but do not think she has either percutaneous or surgical options were reviewed in the revascularization and therefore do not feel she would be candidate for amputation at this time.

## 2014-09-09 NOTE — Progress Notes (Signed)
09/09/2014 [name]   January 30, 1924  [id number]  Primary Physician Ginette OttoSTONEKING,HAL THOMAS, MD Primary Cardiologist: Runell GessJonathan J. Woodie Degraffenreid MD Roseanne RenoFACP,FACC,FAHA, FSCAI   HPI:  [name] is an 80 year old frail-appearing Caucasian female who is accompanied by her daughter today. She was referred by Dr. Irving ShowsEgerton , podiatrist, for peripheral vascular evaluation prior to elective right third toe amputation. Her past history is notable for diastolic dysfunction with normal EF, moderate 8 aortic stenosis, moderate mitral regurgitation as well as chronic atrial fibrillation on oral anti-coagulation. Her other problems include a history of hypertension, hyperlipidemia, diabetes and sleep apnea. She is a DO NOT RESUSCITATE. She is primarily wheelchair-bound. She has a right third toe ulcer with exposed bone and has been treated aggressively with local therapy for over 6 months. Lower extremity artery Doppler studies performed in our office 09/03/14 revealed a right brachial index of 0.2 suggesting that she did not have enough perfusion to heal an amputation wound. She had a high-grade right common femoral artery stenosis with an occluded right popliteal artery and tibial vessels as well.   Current Outpatient Prescriptions  Medication Sig Dispense Refill  . acetaminophen (TYLENOL) 500 MG tablet Take 500 mg by mouth every 6 (six) hours as needed for pain.    Marland Kitchen. allopurinol (ZYLOPRIM) 300 MG tablet Take 1 tablet (300 mg total) by mouth daily. 60 tablet 1  . apixaban (ELIQUIS) 2.5 MG TABS tablet Take 1 tablet (2.5 mg total) by mouth 2 (two) times daily. 60 tablet 0  . atorvastatin (LIPITOR) 10 MG tablet Take 1 tablet (10 mg total) by mouth daily. 30 tablet 1  . Calcium Carbonate-Vitamin D (CALCIUM-VITAMIN D) 600-200 MG-UNIT CAPS Take 1 tablet by mouth daily.     . furosemide (LASIX) 40 MG tablet Take 40-80 mg by mouth 2 (two) times daily. Take 40 mg by mouth twice daily, alternating every other day with 80  mg by mouth in the morning and 40 mg by mouth in the evening.    . gabapentin (NEURONTIN) 300 MG capsule Take 1 capsule (300 mg total) by mouth at bedtime. 30 capsule 1  . HYDROcodone-acetaminophen (NORCO/VICODIN) 5-325 MG per tablet Take 1 tablet by mouth. 3 times a day as needed    . ketotifen (ZADITOR) 0.025 % ophthalmic solution Place 1 drop into both eyes daily as needed (for irritation).    . Lancets (FREESTYLE) lancets 1 each by Other route See admin instructions. Check blood sugar once daily.    Marland Kitchen. levothyroxine (SYNTHROID, LEVOTHROID) 112 MCG tablet Take 112 mcg by mouth daily before breakfast.    . metoprolol succinate (TOPROL-XL) 25 MG 24 hr tablet Take 0.5 tablets (12.5 mg total) by mouth daily. 15 tablet 1  . Multiple Vitamins-Minerals (MULTIVITAMIN WITH MINERALS) tablet Take 1 tablet by mouth daily.    . ONE TOUCH ULTRA TEST test strip 1 each by Other route See admin instructions. Check blood sugar once daily.    . potassium chloride (K-DUR,KLOR-CON) 10 MEQ tablet Take 20 mEq by mouth 2 (two) times daily.     No current facility-administered medications for this visit.    Allergies  Allergen Reactions  . Ambien [Zolpidem Tartrate] Other (See Comments)    Pt does not wakeup from [place]  . Aspirin Other (See Comments)    Tears my stomach up; "can take coated Aspirin qd without problems"  . Nexium [Esomeprazole Magnesium] Rash  . Penicillins Rash and Other (See Comments)    Tolerates Rocephin    History  Social History  . Marital Status: Widowed    Spouse Name: N/A    Number of Children: 3  . Years of Education: 8th   Occupational History  . RETIRED     [employer]   Social History Main Topics  . Smoking status: Never Smoker   . Smokeless tobacco: Never Used  . Alcohol Use: No  . Drug Use: No  . Sexual Activity: No   Other Topics Concern  . Not on file   Social History Narrative   Retired. Lives alone. 3 adult children. No tobacco use. Nondrinker. No illicit  drug use.    Caffeine Use: none     Review of Systems: General: negative for chills, fever, night sweats or weight changes.  Cardiovascular: negative for chest pain, dyspnea on exertion, edema, orthopnea, palpitations, paroxysmal nocturnal dyspnea or shortness of breath Dermatological: negative for rash Respiratory: negative for cough or wheezing Urologic: negative for hematuria Abdominal: negative for nausea, vomiting, diarrhea, bright red blood per rectum, melena, or hematemesis Neurologic: negative for visual changes, syncope, or dizziness All other systems reviewed and are otherwise negative except as noted above.    Blood pressure 104/60, pulse 84, height 5\' 8"  (1.727 m), weight 149 lb (67.586 kg).  General appearance: alert and no distress Neck: no adenopathy, no carotid bruit, no JVD, supple, symmetrical, trachea midline and thyroid not enlarged, symmetric, no tenderness/mass/nodules Lungs: clear to auscultation bilaterally Heart: irregularly irregular rhythm Extremities: extremities normal, atraumatic, no cyanosis or edema and right third toe ulcer, no edema, absent pedal pulses  EKG not performed today  ASSESSMENT AND PLAN:   Peripheral arterial disease [name] was referred to me by Dr. Irving ShowsEgerton  for peripheral vascular evaluation prior to elective amputation of her right third toe. She has a history of diastolic heart failure with normal ejection fraction, moderate aortic stenosis, moderate mitral regurgitation and chronic atrial fibrillation on oral anticoagulation. She developed a right third toe ulcer at the tip of her toe which has been treated for the last 6 months plans were to perform amputation because of exposed bone. She had Doppler studies performed in our office 09/03/14 revealing a high-grade right common femoral artery stenosis, occluded right popliteal artery as well as all tibial vessels. Her right toe brachial index was 0.2 suggesting that she did not have  enough perfusion to heal an amputation wound. At this point, she could potentially have her common femoral artery intervened on percutaneously but do not think she has either percutaneous or surgical options were reviewed in the revascularization and therefore do not feel she would be candidate for amputation at this time.      Runell GessJonathan J. Tenea Sens MD FACP,FACC,FAHA, [facility] 09/09/2014 10:38 AM

## 2014-09-09 NOTE — Patient Instructions (Signed)
Dr Allyson SabalBerry recommends that you schedule a follow-up appointment in 3 months.

## 2014-09-11 ENCOUNTER — Encounter: Payer: Medicare Other | Admitting: Podiatrist

## 2014-09-11 ENCOUNTER — Ambulatory Visit (INDEPENDENT_AMBULATORY_CARE_PROVIDER_SITE_OTHER): Payer: Medicare Other | Admitting: Podiatrist

## 2014-09-11 ENCOUNTER — Encounter: Payer: Self-pay | Admitting: Podiatrist

## 2014-09-11 VITALS — BP 119/74 | HR 79 | Resp 16

## 2014-09-11 DIAGNOSIS — I739 Peripheral vascular disease, unspecified: Secondary | ICD-10-CM

## 2014-09-11 DIAGNOSIS — L97514 Non-pressure chronic ulcer of other part of right foot with necrosis of bone: Secondary | ICD-10-CM

## 2014-09-11 NOTE — Patient Instructions (Signed)
Instructions for Wound Care   Cleanse your foot with saline wash or warm soapy water (dial antibacterial soap or similar).  Blot dry.  Apply prescribed medication to your wound and cover with gauze and a bandage.   Your prescribed topical medication is :  Iodosorb gel-  Apply daily or every other day to the toe and wrap with gauze and tape  [facility] is a mail order medical supply company that we use to provide some of our would care products.  If we use their service of you, you will receive the product by mail.  If you have not received the medication in 3 business days, please call our office.  If you notice any foul odor, increase in pain, pus, increased swelling, red streaks or generalized redness occurring in your foot or leg-Call our office immediately to be seen.  This may be a sign of a limb or life threatening infection that will need prompt attention.  Marlowe AschoffKathryn Dawt Reeb, DPM  [facility]

## 2014-09-13 NOTE — Progress Notes (Signed)
Patient presents today for follow up of ulceration on the tip of the right 3rd toe. She has been applying medihoney to the ulcerations. She had a vascular study and a follow up appointment with Dr. York RamJonathan Barry who recommended no intervention due to her poor vascular supply and inabilty to do any type of intervention on these blocked vessels.    Objective: Neurovascular status unchanged. Pulses are non palpable and neurological sensation is decreased. the ulceration on the distal tip of the right third toe is unchanged and now has exposed bone at the distal tip. There is some mild redness and swelling to the right third toe itself  It has gtenerally been unchanged since the ulceration developed. .   Assessment: Healed Ulceration submet 1 of the left foot ,ulcer down to bone distal tip of right 3rd toe, PVD.  Plan:  Recommended conservative care only.  Will switch her from medihoney to iodosorb gel.  Discussed with her daughter giving her a stronger pain medication for the toe discomfort however, she states it has negative effects on her mentally so she would prefer to stay on the hydrocodone.  If she notices any redness, swelling, drainage, darkening discoloration or concerns, she is instructed to call.  I will recheck her toe in 3 weeks.  We will try to order iodosorb through [facility] for her.

## 2014-09-14 ENCOUNTER — Other Ambulatory Visit: Payer: Self-pay | Admitting: *Deleted

## 2014-09-14 NOTE — Telephone Encounter (Signed)
Prescription sent to [facility] for Iodosorb Gel 1tube, low exudate ulcer 3rd right toe, 4mm x 3mm x 1mm.  30 day supply.  Wounds have been debrided.  She is not being seen by [facility].  Patient has been shown how to use it.

## 2014-09-18 ENCOUNTER — Telehealth: Payer: Self-pay | Admitting: *Deleted

## 2014-09-18 NOTE — Telephone Encounter (Signed)
Pt's dtr, Rosey Batheresa states the Iodosorb get prescribed by Dr. Irving ShowsEgerton has not been [facility] from [facility].  Please call.

## 2014-09-18 NOTE — Telephone Encounter (Signed)
I called [facility] and inquired about the Iodosorb.  She stated, "It was shipped out yesterday.  She should receive it on tomorrow by [employer]."   I called and left her a message that I called [facility].  They said your mother should receive the Iodosorb on tomorrow via [employer].  Call if you have any further questions or concerns.

## 2014-10-08 ENCOUNTER — Other Ambulatory Visit (HOSPITAL_COMMUNITY): Payer: Self-pay | Admitting: Interventional Radiology

## 2014-10-08 DIAGNOSIS — IMO0002 Reserved for concepts with insufficient information to code with codable children: Secondary | ICD-10-CM

## 2014-10-08 DIAGNOSIS — M549 Dorsalgia, unspecified: Secondary | ICD-10-CM

## 2014-10-09 ENCOUNTER — Ambulatory Visit (INDEPENDENT_AMBULATORY_CARE_PROVIDER_SITE_OTHER): Payer: Medicare Other | Admitting: Podiatrist

## 2014-10-09 DIAGNOSIS — M79673 Pain in unspecified foot: Secondary | ICD-10-CM

## 2014-10-09 DIAGNOSIS — L97514 Non-pressure chronic ulcer of other part of right foot with necrosis of bone: Secondary | ICD-10-CM

## 2014-10-09 DIAGNOSIS — B351 Tinea unguium: Secondary | ICD-10-CM

## 2014-10-13 ENCOUNTER — Ambulatory Visit (HOSPITAL_COMMUNITY)
Admission: RE | Admit: 2014-10-13 | Discharge: 2014-10-13 | Disposition: A | Discharge status: Home / Self Care | Payer: Medicare Other | Source: Ambulatory Visit | Attending: Interventional Radiology | Admitting: Interventional Radiology

## 2014-10-13 DIAGNOSIS — IMO0002 Reserved for concepts with insufficient information to code with codable children: Secondary | ICD-10-CM

## 2014-10-13 DIAGNOSIS — M549 Dorsalgia, unspecified: Secondary | ICD-10-CM

## 2014-10-16 ENCOUNTER — Other Ambulatory Visit (HOSPITAL_COMMUNITY): Payer: Self-pay | Admitting: Interventional Radiology

## 2014-10-16 ENCOUNTER — Other Ambulatory Visit: Payer: Self-pay | Admitting: Radiology

## 2014-10-16 DIAGNOSIS — IMO0002 Reserved for concepts with insufficient information to code with codable children: Secondary | ICD-10-CM

## 2014-10-16 DIAGNOSIS — M549 Dorsalgia, unspecified: Secondary | ICD-10-CM

## 2014-10-16 DIAGNOSIS — S32020A Wedge compression fracture of second lumbar vertebra, initial encounter for closed fracture: Secondary | ICD-10-CM

## 2014-10-19 NOTE — Progress Notes (Signed)
Patient presents today for follow up of ulceration on the tip of the right 3rd toe. She has been applying medihoney to the ulcerations. She had a vascular study and a follow up appointment with Dr. York RamJonathan Barry who recommended no intervention due to her poor vascular supply and inabilty to do any type of intervention on these blocked vessels.    Objective: Neurovascular status unchanged. Pulses are non palpable and neurological sensation is decreased. the ulceration on the distal tip of the right third toe is unchanged and now has exposed bone at the distal tip. There is some mild redness and swelling to the right third toe itself  It has gtenerally been unchanged since the ulceration developed. Carmell [place]. Toenails today are symptomatic, and elongated.  Assessment: Healed Ulceration submet 1 of the left foot ,ulcer down to bone distal tip of right 3rd toe, PVD., Symptomatic mycotic toenails  Plan:  Recommended conservative care only.  Will switch her from medihoney to iodosorb gel.  Discussed with her daughter giving her a stronger pain medication for the toe discomfort however, she states it has negative effects on her mentally so she would prefer to stay on the hydrocodone.  If she notices any redness, swelling, drainage, darkening discoloration or concerns, she is instructed to call.  I will recheck her toe in 3 weeks.  Debridement of toenails carried out today as well.

## 2014-10-20 ENCOUNTER — Other Ambulatory Visit (HOSPITAL_COMMUNITY): Payer: Self-pay | Admitting: Interventional Radiology

## 2014-10-20 ENCOUNTER — Encounter (HOSPITAL_COMMUNITY): Payer: Self-pay

## 2014-10-20 ENCOUNTER — Ambulatory Visit (HOSPITAL_COMMUNITY)
Admission: RE | Admit: 2014-10-20 | Discharge: 2014-10-20 | Disposition: A | Discharge status: Home / Self Care | Payer: Medicare Other | Source: Ambulatory Visit | Attending: Interventional Radiology | Admitting: Interventional Radiology

## 2014-10-20 DIAGNOSIS — N183 Chronic kidney disease, stage 3 (moderate): Secondary | ICD-10-CM | POA: Insufficient documentation

## 2014-10-20 DIAGNOSIS — S32029S Unspecified fracture of second lumbar vertebra, sequela: Secondary | ICD-10-CM | POA: Insufficient documentation

## 2014-10-20 DIAGNOSIS — M1990 Unspecified osteoarthritis, unspecified site: Secondary | ICD-10-CM | POA: Diagnosis not present

## 2014-10-20 DIAGNOSIS — E119 Type 2 diabetes mellitus without complications: Secondary | ICD-10-CM | POA: Diagnosis not present

## 2014-10-20 DIAGNOSIS — M549 Dorsalgia, unspecified: Secondary | ICD-10-CM

## 2014-10-20 DIAGNOSIS — E780 Pure hypercholesterolemia: Secondary | ICD-10-CM | POA: Diagnosis not present

## 2014-10-20 DIAGNOSIS — Z8673 Personal history of transient ischemic attack (TIA), and cerebral infarction without residual deficits: Secondary | ICD-10-CM | POA: Insufficient documentation

## 2014-10-20 DIAGNOSIS — S32020A Wedge compression fracture of second lumbar vertebra, initial encounter for closed fracture: Secondary | ICD-10-CM

## 2014-10-20 DIAGNOSIS — I129 Hypertensive chronic kidney disease with stage 1 through stage 4 chronic kidney disease, or unspecified chronic kidney disease: Secondary | ICD-10-CM | POA: Insufficient documentation

## 2014-10-20 DIAGNOSIS — K219 Gastro-esophageal reflux disease without esophagitis: Secondary | ICD-10-CM | POA: Diagnosis not present

## 2014-10-20 DIAGNOSIS — E039 Hypothyroidism, unspecified: Secondary | ICD-10-CM | POA: Diagnosis not present

## 2014-10-20 DIAGNOSIS — X58XXXS Exposure to other specified factors, sequela: Secondary | ICD-10-CM | POA: Insufficient documentation

## 2014-10-20 DIAGNOSIS — I5032 Chronic diastolic (congestive) heart failure: Secondary | ICD-10-CM | POA: Insufficient documentation

## 2014-10-20 LAB — CBC
HCT: 36.8 % (ref 36.0–46.0)
HEMOGLOBIN: 11.8 g/dL — AB (ref 12.0–15.0)
MCH: 31.5 pg (ref 26.0–34.0)
MCHC: 32.1 g/dL (ref 30.0–36.0)
MCV: 98.1 fL (ref 78.0–100.0)
PLATELETS: 190 10*3/uL (ref 150–400)
RBC: 3.75 MIL/uL — AB (ref 3.87–5.11)
RDW: 17.9 % — ABNORMAL HIGH (ref 11.5–15.5)
WBC: 9.7 10*3/uL (ref 4.0–10.5)

## 2014-10-20 LAB — BASIC METABOLIC PANEL
Anion gap: 9 (ref 5–15)
BUN: 20 mg/dL (ref 6–23)
CO2: 33 mmol/L — ABNORMAL HIGH (ref 19–32)
Calcium: 9.7 mg/dL (ref 8.4–10.5)
Chloride: 96 mEq/L (ref 96–112)
Creatinine, Ser: 1.06 mg/dL (ref 0.50–1.10)
GFR calc Af Amer: 53 mL/min — ABNORMAL LOW (ref >90)
GFR calc non Af Amer: 45 mL/min — ABNORMAL LOW (ref >90)
GLUCOSE: 126 mg/dL — AB (ref 70–99)
POTASSIUM: 4.2 mmol/L (ref 3.5–5.1)
Sodium: 138 mmol/L (ref 135–145)

## 2014-10-20 LAB — APTT: aPTT: 31 seconds (ref 24–37)

## 2014-10-20 LAB — PROTIME-INR
INR: 1.13 (ref 0.00–1.49)
Prothrombin Time: 14.7 seconds (ref 11.6–15.2)

## 2014-10-20 LAB — GLUCOSE, CAPILLARY
GLUCOSE-CAPILLARY: 103 mg/dL — AB (ref 70–99)
Glucose-Capillary: 147 mg/dL — ABNORMAL HIGH (ref 70–99)

## 2014-10-20 MED ORDER — SODIUM CHLORIDE 0.9 % IV SOLN: INTRAVENOUS | Status: AC

## 2014-10-20 MED ORDER — VANCOMYCIN HCL IN DEXTROSE 1-5 GM/200ML-% IV SOLN
1000.0000 mg | Freq: Once | INTRAVENOUS | Status: AC
  Administered 2014-10-20: 1000 mg via INTRAVENOUS
  Filled 2014-10-20: qty 200

## 2014-10-20 MED ORDER — TOBRAMYCIN SULFATE 1.2 G IJ SOLR
INTRAMUSCULAR | Status: AC
  Filled 2014-10-20: qty 1.2

## 2014-10-20 MED ORDER — SODIUM CHLORIDE 0.9 % IV SOLN
Freq: Once | INTRAVENOUS | Status: AC
  Administered 2014-10-20: 08:00:00 via INTRAVENOUS

## 2014-10-20 MED ORDER — FENTANYL CITRATE 0.05 MG/ML IJ SOLN
INTRAMUSCULAR | Status: DC | PRN
  Administered 2014-10-20: 25 ug via INTRAVENOUS
  Administered 2014-10-20: 12.5 ug via INTRAVENOUS

## 2014-10-20 MED ORDER — MIDAZOLAM HCL 2 MG/2ML IJ SOLN
INTRAMUSCULAR | Status: AC
  Filled 2014-10-20: qty 4

## 2014-10-20 MED ORDER — MIDAZOLAM HCL 2 MG/2ML IJ SOLN
INTRAMUSCULAR | Status: DC | PRN
  Administered 2014-10-20: 0.5 mg via INTRAVENOUS
  Administered 2014-10-20: 1 mg via INTRAVENOUS

## 2014-10-20 MED ORDER — FENTANYL CITRATE 0.05 MG/ML IJ SOLN
INTRAMUSCULAR | Status: AC
  Filled 2014-10-20: qty 4

## 2014-10-20 MED ORDER — BUPIVACAINE HCL (PF) 0.25 % IJ SOLN
INTRAMUSCULAR | Status: AC
  Filled 2014-10-20: qty 30

## 2014-10-20 MED ORDER — HYDROMORPHONE HCL 1 MG/ML IJ SOLN
INTRAMUSCULAR | Status: AC
  Filled 2014-10-20: qty 1

## 2014-10-20 NOTE — Progress Notes (Signed)
Per Dr Corliss Skainseveshwar pt is to restart eliquis, tomorrow, family informed and gave verbal understanding.

## 2014-10-20 NOTE — Sedation Documentation (Signed)
Patient is resting comfortably. 

## 2014-10-20 NOTE — Discharge Instructions (Signed)
1.No stooping,bending or lifting more than 10 lbs for 2 weeks. °2.Use walker for 2 weeks. °3.RTC in  2 weeks °KYPHOPLASTY/VERTEBROPLASTY DISCHARGE INSTRUCTIONS ° °Medications: (check all that apply) ° °   Resume all home medications as before procedure. °   °             °  °Continue your pain medications as prescribed as needed.  Over the next 3-5 days, decrease your pain medication as tolerated.  Over the counter medications (i.e. Tylenol, ibuprofen, and aleve) may be substituted once severe/moderate pain symptoms have subsided. ° ° Wound Care: °- Bandages may be removed the day following your procedure.  You may get your incision wet once bandages are removed.  Bandaids may be used to cover the incisions until scab formation.  Topical ointments are optional. ° °- If you develop a fever greater than 101 degrees, have increased skin redness at the incision sites or pus-like oozing from incisions occurring within 1 week of the procedure, contact radiology at [phone] or [phone]. ° °- Ice pack to back for 15-20 minutes 2-3 time per day for first 2-3 days post procedure.  The ice will expedite muscle healing and help with the pain from the incisions. ° ° Activity: °- Bedrest today with limited activity for 24 hours post procedure. ° °- No driving for 48 hours. ° °- Increase your activity as tolerated after bedrest (with assistance if necessary). ° °- Refrain from any strenuous activity or heavy lifting (greater than 10 lbs.). ° ° Follow up: °- Contact radiology at [phone] or [phone] if any questions/concerns. ° °- A physician assistant from radiology will contact you in approximately 1 week. ° °- If a biopsy was performed at the time of your procedure, your referring physician should receive the results in usually 2-3 days. ° ° ° ° ° ° ° ° °

## 2014-10-20 NOTE — H&P (Signed)
Chief Complaint: Severe low back pain  Referring Physician(s): Tangy Drozdowski K; Dr Donette Larry  History of Present Illness: [name] is a 80 y.o. female  Pt has had low back pain worsening x few months MRI 07/2014 does confirm acute Lumbar 2 compression fx Referred to Dr Corliss Skains per Dr Donette Larry for consult regarding VP/KP Now scheduled for kyphoplasty in IR Last dose Eliquis Sat 12/19   Past Medical History  Diagnosis Date  . Hypertension   . Peripheral neuropathy   . Asthma   . High cholesterol   . Diabetes with neurologic complications   . PAD (peripheral artery disease)   . Enlarged heart   . Anginal pain 05/28/12  . Pneumonia 11/2010  . Sleep apnea     cpap  . Hypothyroidism   . Headache(784.0) 05/29/12    "recently"  . Stroke 05/2005; 08/2010    !mini"  . Personal history of gout   . Hypothyroidism   . DJD (degenerative joint disease)   . Carpal tunnel syndrome, bilateral   . Recurrent labyrinthitis   . GERD (gastroesophageal reflux disease)     S/P esophageal dilation in 1996  . Gallstone     Silent  . Fibrocystic breast changes   . Hemorrhoids     with bleeding  . Hx of adenomatous colonic polyps     Dr Sherin Quarry  . Diverticulitis     left side  . MR (mitral regurgitation)     moderate  . Mild anemia     hemoglobin 11.8 on 06/2009  . Edema of lower extremity     Chronic  . Ischemic colitis 8/13  . Aortic stenosis, moderate   . Chronic diastolic CHF (congestive heart failure)   . Moderate mitral regurgitation   . CKD (chronic kidney disease) stage 3, GFR 30-59 ml/min     "something on labs always say she has some problems"  . Chronic atrial fibrillation   . Peripheral arterial disease     critical limb ischemia, right third toe    Past Surgical History  Procedure Laterality Date  . Tonsillectomy and adenoidectomy  1960's  . Dilation and curettage of uterus    . Vaginal hysterectomy  1970's  . Total knee arthroplasty  1970-~2002   left; right  . Cataract extraction w/ intraocular lens  implant, bilateral  ?1970's    Allergies: Ambien; Aspirin; Nexium; and Penicillins  Medications: Prior to Admission medications   Medication Sig Start Date End Date Taking? Authorizing Provider  acetaminophen (TYLENOL) 500 MG tablet Take 500 mg by mouth every 8 (eight) hours as needed for moderate pain. Give with the Tramadol 50 mg   Yes Historical Provider, MD  allopurinol (ZYLOPRIM) 300 MG tablet Take 1 tablet (300 mg total) by mouth daily. 01/30/14  Yes Daniel J Angiulli, PA-C  apixaban (ELIQUIS) 2.5 MG TABS tablet Take 1 tablet (2.5 mg total) by mouth 2 (two) times daily. 02/08/14  Yes Shanker Levora Dredge, MD  aspirin EC 81 MG tablet Take 81 mg by mouth daily.   Yes Historical Provider, MD  atorvastatin (LIPITOR) 10 MG tablet Take 1 tablet (10 mg total) by mouth daily. 01/30/14  Yes Daniel J Angiulli, PA-C  Calcium Carbonate-Vitamin D (CALCIUM-VITAMIN D) 600-200 MG-UNIT CAPS Take 1 tablet by mouth daily.    Yes Historical Provider, MD  furosemide (LASIX) 40 MG tablet Take 40-80 mg by mouth 2 (two) times daily. Take 40 mg by mouth twice daily, alternating every other day with 80 mg by  mouth in the morning and 40 mg by mouth in the evening. 02/08/14  Yes Shanker Levora Dredge, MD  gabapentin (NEURONTIN) 300 MG capsule Take 1 capsule (300 mg total) by mouth at bedtime. 01/30/14  Yes Daniel J Angiulli, PA-C  levothyroxine (SYNTHROID, LEVOTHROID) 112 MCG tablet Take 112 mcg by mouth daily before breakfast.   Yes Historical Provider, MD  metoprolol succinate (TOPROL-XL) 25 MG 24 hr tablet Take 0.5 tablets (12.5 mg total) by mouth daily. 01/30/14  Yes Daniel J Angiulli, PA-C  Multiple Vitamins-Minerals (MULTIVITAMIN WITH MINERALS) tablet Take 1 tablet by mouth daily.   Yes Historical Provider, MD  potassium chloride (K-DUR,KLOR-CON) 10 MEQ tablet Take 20 mEq by mouth 2 (two) times daily.   Yes Historical Provider, MD  traMADol (ULTRAM) 50 MG tablet Take 50  mg by mouth every 8 (eight) hours as needed for moderate pain. Give with Tylenol 500 mg   Yes Historical Provider, MD  ketotifen (ZADITOR) 0.025 % ophthalmic solution Place 1 drop into both eyes daily as needed (for irritation).    Historical Provider, MD  Lancets (FREESTYLE) lancets 1 each by Other route See admin instructions. Check blood sugar once daily. 05/15/14   Historical Provider, MD  ONE TOUCH ULTRA TEST test strip 1 each by Other route See admin instructions. Check blood sugar once daily. 05/15/14   Historical Provider, MD    Family History  Problem Relation Age of Onset  . Heart disease Mother   . Heart disease Father   . Heart disease Brother     History   Social History  . Marital Status: Widowed    Spouse Name: N/A    Number of Children: 3  . Years of Education: 8th   Occupational History  . RETIRED     [employer]   Social History Main Topics  . Smoking status: Never Smoker   . Smokeless tobacco: Never Used  . Alcohol Use: No  . Drug Use: No  . Sexual Activity: No   Other Topics Concern  . None   Social History Narrative   Retired. Lives alone. 3 adult children. No tobacco use. Nondrinker. No illicit drug use.    Caffeine Use: none     Review of Systems: A 12 point ROS discussed and pertinent positives are indicated in the HPI above.  All other systems are negative.  Review of Systems  Constitutional: Positive for activity change, appetite change and fatigue. Negative for fever.  Respiratory: Negative for cough, chest tightness and shortness of breath.   Cardiovascular: Negative for chest pain.  Gastrointestinal: Negative for nausea and abdominal pain.  Genitourinary: Negative for difficulty urinating.  Musculoskeletal: Positive for back pain and gait problem.  Skin: Negative for color change.  Neurological: Positive for weakness. Negative for numbness.  Psychiatric/Behavioral: Negative for behavioral problems and confusion.    Vital Signs: BP 120/67  mmHg  Pulse 104  Temp(Src) 97.7 F (36.5 C) (Oral)  Resp 16  Ht 5\' 7"  (1.702 m)  Wt 68.947 kg (152 lb)  BMI 23.80 kg/m2  SpO2 94%  Physical Exam  Constitutional: She is oriented to person, place, and time.  Cardiovascular: Normal rate and regular rhythm.   Murmur heard. Pulmonary/Chest: Effort normal and breath sounds normal. She has no wheezes.  Abdominal: Soft. Bowel sounds are normal. She exhibits no distension. There is no tenderness.  Musculoskeletal: Normal range of motion.  Low back pain Rt foot pain  Neurological: She is alert and oriented to person, place, and time.  Skin: Skin is warm and dry.  Psychiatric: She has a normal mood and affect. Her behavior is normal. Judgment and thought content normal.  Nursing note and vitals reviewed.   Imaging: Ir Radiologist Eval & Mgmt  10/14/2014   EXAM: NEW PATIENT OFFICE VISIT  CHIEF COMPLAINT: Severe low back pain secondary to compression fracture at L2.  Current Pain Level: 1-10  HISTORY OF PRESENT ILLNESS: The patient is an 80 year old lady who has been referred by Dr. Donette LarryHusain for evaluation of treatment of a compression fracture at L2 which is severely asymptomatic.  The patient is accompanied by her daughter.  According to both of them, the patient apparently started having this pain sometime in October without any precise history of events leading up to the pain.  However since October, the patient has had significantly severe pain in her low back region. This was subsequently followed up by an MRI of the lumbosacral spine which revealed an L2 compression fracture with mild loss of vertebral body height.  However, the patient at the time also had issues with her diabetic foot necessitating priority. Since that time the patient reports significant amount of pain in the low back region almost constant at an 8-9 out of 10.  The patient is apparently worse while she is moving or shifting or stooping or coughing. The pain is somewhat  relieved by being comfortable in a chair.  She also takes tramadol with Tylenol every 8 hours which also alleviates it to some degree.  Overall, the patient is significantly affected in terms of quality of life with her daily life activities.  There is no radiation of pain in her lower extremities in a radicular manner. No radiation circumferentially. The patient denies any autonomic dysfunction of her bowel or bladder. She denies any symptoms of urinary tract infection namely dysuria, frequency of micturition, hematuria or polyuria.  She claims her appetite has remained essentially unchanged. Her weight has been steady.  Past Medical History: Cerebrovascular accident right cerebral hemisphere. Patent foramen ovale with a left-to-right shunt. Aortic valve stenosis. Hypothyroidism. Diabetes mellitus. Hypercholesterolemia. Colonic polyps. Atrial fibrillation. Peripheral vascular disease.  Medications: Allopurinol for gout. Apixaban for atrial fibrillation. Atorvastatin for high cholesterol. Calcium supplements. Furosemide. Gabapentin for peripheral neuropathy. K-Lor. Levothyroxine. Metoprolol. Multivitamins. Tramadol. Tylenol.  Allergies: Penicillin causes a rash. Aspirin causes indigestion. Nexium causes a rash. Ambien causes mental status changes.  Social History: Patient llives in assisted living fully independent prior to the fracture. Denies drinking alcohol or smoking tobacco. Denies use of illicit chemicals.  Family History: Positive for asthma, diabetes, heart problems, high blood pressure.  REVIEW OF SYSTEMS: History of peripheral extremity burning, feet greater than hands on account of peripheral neuropathy. Otherwise apart from mentioned above, negative for pathologic symptomatology.  PHYSICAL EXAMINATION: Patient in moderate distress on account of the pain. Tearful at times when moving in the chair because of the pain. Affect flat.  Neurologically, awake, alert oriented to time, place, space. Speech and  comprehension within normal limits. No obvious lateralizing cranial nerve abnormalities or motor or sensory symptoms or signs. Station and gait not tested.  On palpation in the upper lumbar region, patient exquisitely tender causing the patient to be tearful.  ASSESSMENT AND PLAN: The patient's MRI scan of the lumbosacral spine from late October was reviewed. This depicted an L2 compression fracture with mild loss of the vertebral body height anteriorly and superiorly. Minimal retropulsion noted at the time. Both the daughter and the patient were informed this was probably  the most likely source of her severe pain. However it was also possible there could have been changes since the MRI scan of October. These could involve further compression deformity of the L2 fracture and/or the presence of new fracture since the time of her MRI scan. The option of vertebral augmentation with kyphoplasty or vertebroplasty was reviewed for pain relief, and also to prevent further collapse of the fracture. This would also improve the patient's quality of life and reduce her need for pain medications which had a secondary effect of slowing the patient mentally also. The procedure, risks, benefits and alternative were reviewed in detail. Questions were answered to their satisfaction.  The patient and the daughter are inclined to proceed with the vertebral body augmentation at L2. This will be scheduled at the earliest possible. Both the patient and her daughter leave with good understanding and agreement with the above management plan.   Electronically Signed   By: Julieanne CottonSanjeev  Kamilia Carollo M.D.   On: 10/13/2014 17:15    Labs:  CBC:  Recent Labs  02/07/14 0525 02/08/14 0612 02/10/14 1207 10/20/14 0723  WBC 10.8* 11.5* 13.3* 9.7  HGB 12.6 13.1 13.0 11.8*  HCT 37.9 39.7 38.1 36.8  PLT 214 210 231 190    COAGS: No results for input(s): INR, APTT in the last 8760 hours.  BMP:  Recent Labs  02/08/14 0612 02/10/14 1207  02/12/14 1421 07/22/14 1213  NA 133* 133* 132* 136*  K 3.8 3.8 3.4* 4.8  CL 87* 86* 81* 95*  CO2 33* 31 33* 29  GLUCOSE 93 111* 137* 76  BUN 32* 51* 56* 27*  CALCIUM 9.4 9.6 9.9 9.5  CREATININE 1.28* 1.50* 1.56* 1.28*  GFRNONAA 36* 30* 29* 36*  GFRAA 42* 35* 33* 42*    LIVER FUNCTION TESTS:  Recent Labs  02/04/14 0008 02/05/14 0645 02/06/14 0526 02/07/14 0525  BILITOT 0.9 0.7 0.8 0.8  AST 62* 81* 61* 92*  ALT 37* 36* 35 42*  ALKPHOS 108 99 104 114  PROT 6.4 5.9* 6.3 6.7  ALBUMIN 2.8* 2.3* 2.4* 2.6*    TUMOR MARKERS: No results for input(s): AFPTM, CEA, CA199, CHROMGRNA in the last 8760 hours.  Assessment and Plan:  L2 acute fx Severe back pain Scheduled for L2 KP Pt and dtr aware of procedure benefits and risks and agreeable to proceed Consent signed andin chart LD Eliquis 10/17/14  Thank you for this interesting consult.  I greatly enjoyed meeting Addisyn V Earl GalaOsborne and look forward to participating in their care.     I spent a total of 20 minutes face to face in clinical consultation, greater than 50% of which was counseling/coordinating care for L2 KP  Signed: TURPIN,PAMELA A 10/20/2014, 8:18 AM

## 2014-10-20 NOTE — Procedures (Signed)
S/P L 2 KP 

## 2014-10-20 NOTE — Sedation Documentation (Signed)
Sterile dressing applied to site. 

## 2014-11-02 ENCOUNTER — Telehealth (HOSPITAL_COMMUNITY): Payer: Self-pay | Admitting: Interventional Radiology

## 2014-11-02 NOTE — Telephone Encounter (Signed)
Called pt's daughter and left VM for her to call me to reschedule f/u consult visit scheduled for 11/03/14 per Deveshwar. JM

## 2014-11-03 ENCOUNTER — Ambulatory Visit (HOSPITAL_COMMUNITY): Admission: RE | Admit: 2014-11-03 | Payer: Medicare Other | Source: Ambulatory Visit

## 2014-11-09 ENCOUNTER — Encounter: Payer: Self-pay | Admitting: Cardiology

## 2014-11-12 ENCOUNTER — Ambulatory Visit (INDEPENDENT_AMBULATORY_CARE_PROVIDER_SITE_OTHER): Payer: Medicare Other | Admitting: Podiatrist

## 2014-11-12 VITALS — BP 126/61 | HR 90 | Temp 97.3°F | Resp 14

## 2014-11-12 DIAGNOSIS — L97511 Non-pressure chronic ulcer of other part of right foot limited to breakdown of skin: Secondary | ICD-10-CM

## 2014-11-12 DIAGNOSIS — L97514 Non-pressure chronic ulcer of other part of right foot with necrosis of bone: Secondary | ICD-10-CM

## 2014-11-12 NOTE — Progress Notes (Signed)
Patient presents today for follow up of ulceration on the tip of the right 3rd toe. She has been applying iodosorb to the ulcerations. She had a vascular study and a follow up appointment with Dr. York RamJonathan Barry who recommended no intervention due to her poor vascular supply and inabilty to do any type of intervention on these blocked vessels.    Objective: Neurovascular status unchanged. Pulses are non palpable and neurological sensation is decreased. the ulceration on the distal tip of the right third toe is unchanged and now has exposed bone at the distal tip. There is some mild redness and swelling to the right third toe itself  It has been unchanged since the ulceration developed.  Assessment: ulcer down to bone distal tip of right 3rd toe, PVD.  Plan:  Recommended conservative care only.  Will continue iodosorb gel.  If she notices any redness, swelling, drainage, darkening discoloration or concerns, she is instructed to call. I will see her back every 6 weeks for evaluation and will trim her toenails at the next visit if needed.

## 2014-11-13 ENCOUNTER — Ambulatory Visit: Payer: Medicare Other | Admitting: Podiatrist

## 2014-11-16 ENCOUNTER — Ambulatory Visit (HOSPITAL_COMMUNITY)
Admission: RE | Admit: 2014-11-16 | Discharge: 2014-11-16 | Disposition: A | Discharge status: Home / Self Care | Payer: Medicare Other | Source: Ambulatory Visit | Attending: Interventional Radiology | Admitting: Interventional Radiology

## 2014-11-16 DIAGNOSIS — M549 Dorsalgia, unspecified: Secondary | ICD-10-CM

## 2014-11-18 ENCOUNTER — Other Ambulatory Visit: Payer: Self-pay | Admitting: Geriatric Medicine

## 2014-11-18 ENCOUNTER — Other Ambulatory Visit (HOSPITAL_COMMUNITY): Payer: Self-pay | Admitting: Cardiology

## 2014-11-18 DIAGNOSIS — N6320 Unspecified lump in the left breast, unspecified quadrant: Secondary | ICD-10-CM

## 2014-11-18 DIAGNOSIS — R921 Mammographic calcification found on diagnostic imaging of breast: Secondary | ICD-10-CM

## 2014-11-18 MED ORDER — POTASSIUM CHLORIDE CRYS ER 10 MEQ PO TBCR: 20.0000 meq | EXTENDED_RELEASE_TABLET | Freq: Two times a day (BID) | ORAL | Status: DC

## 2014-11-19 ENCOUNTER — Telehealth: Payer: Self-pay | Admitting: *Deleted

## 2014-11-19 ENCOUNTER — Other Ambulatory Visit: Payer: Self-pay | Admitting: Podiatrist

## 2014-11-19 MED ORDER — DOXYCYCLINE HYCLATE 100 MG PO TABS: 100.0000 mg | ORAL_TABLET | Freq: Two times a day (BID) | ORAL | Status: DC

## 2014-11-19 NOTE — Telephone Encounter (Signed)
-----   Message from Delories HeinzKathryn P Egerton, DPM sent at 11/19/2014 10:23 AM EST ----- Regarding: appt for toe? Hey Val,  Can you check to see when my next visit is scheduled for Mrs. Earl Galasborne?  I probably need to check the toe next week-- to make sure the antibiotic is working.    Thanks!!

## 2014-11-19 NOTE — Telephone Encounter (Signed)
Pt's dtr, Rosey Batheresa was referred to schedulers and pt is scheduled with Dr. Ardelle AntonWagoner on 11/24/2014.

## 2014-11-19 NOTE — Telephone Encounter (Signed)
Pt's dtr, Teresastates pt has another toe that is red and draining, what to do?  Dr. Irving ShowsEgerton ordered dress toe with Iodosorb, take the antibiotic and make an appt for next week.  Orders to Kasotaeresa, and transferred to schedulers.

## 2014-11-24 ENCOUNTER — Ambulatory Visit (INDEPENDENT_AMBULATORY_CARE_PROVIDER_SITE_OTHER): Payer: Medicare Other | Admitting: Podiatry

## 2014-11-24 ENCOUNTER — Ambulatory Visit (INDEPENDENT_AMBULATORY_CARE_PROVIDER_SITE_OTHER): Payer: Medicare Other

## 2014-11-24 ENCOUNTER — Encounter: Payer: Self-pay | Admitting: Podiatry

## 2014-11-24 VITALS — BP 127/60 | HR 62 | Resp 18

## 2014-11-24 DIAGNOSIS — L97514 Non-pressure chronic ulcer of other part of right foot with necrosis of bone: Secondary | ICD-10-CM

## 2014-11-24 DIAGNOSIS — R52 Pain, unspecified: Secondary | ICD-10-CM

## 2014-11-24 DIAGNOSIS — I739 Peripheral vascular disease, unspecified: Secondary | ICD-10-CM

## 2014-11-24 NOTE — Patient Instructions (Signed)
Continue daily dressing changes, twice a day

## 2014-11-25 NOTE — Progress Notes (Signed)
Patient ID: Moody Bruinslarice V Ladley, female   DOB: 1924/09/27, 81 y.o.   MRN: [id number]  Subjective: 81 year old female returns the office today for a new wound on the right fifth toe which started last Thursday. She also has ulceration to the distal aspect of the right third toe. To both these wounds they have been applying Iodosorb daily followed by a dry dressing. The patient and her family member who, in her today states that there is a small amount of drainage coming from both of the wounds however the denies any purulence. Denies any increase in erythema or any streaking. She is currently taking doxycycline for possible infection. No other complaints at this time. Denies any systemic complaints as fevers, chills, nausea, vomiting.  Objective: Awake, alert, NAD DP/PT pulses are nonpalpable bilaterally, CRT delayed. Protective sensation decreased with Dorann OuSimms Weinstein monofilament Ulceration of the distal aspect of the right third toe with exposed distal phalanx. There is mild edema to the third digit there is no significant increase in erythema or ascending cellulitis. There is no areas of fluctuance or crepitus. Ulceration on the lateral aspect of the fifth digit with fibro-granular wound base which probes very close to bone although does not at this time. There is no undermining or tunneling. No swelling erythema, ascending cellulitis. There is a pre-ulcerative lesion on the medial aspect of the first MTPJ on the right foot. Again no signs of infection to this area this time. Thick, hyperkeratotic tissue along the plantar aspect submetatarsal one with evidence of dried blood on the left foot. Upon debridement of this lesion no definitive underlying ulceration is identified. No other open lesions or pre-ulcerative lesions identified. No pain with calf compression, swelling, warmth, erythema.  Assessment: 81 year old female with ulceration right third, fifth digit, pre-ulcerative lesion medial first  MTPJ; pre-ulcerative lesion left submetatarsal one  Plan: -X-rays were obtained and reviewed with the patient of the right foot. -Treatment options discussed including alternatives, risks, complications. -At this time recommended to continue twice a day digit dressing changes with iodosorb. -As the patient is not a candidate for amputation I do think that cutting out the exposed bone to the third digit would be beneficial in order to help get the wound closed in conjunction with aggressive wound care. Discussed with the patient/caregiver however this seems very hesitant for me to do this at today's appointment. We'll discuss with Dr. Brynda RimEdgington.  -The patient is been wearing a sock which appears to be compressing on the toes. Recommended a looser fitting sock. -Follow-up in 1 week with Dr. Irving ShowsEgerton, or sooner should any problems arise. In the meantime, encouraged to call the office with any questions, concerns, changes symptoms.

## 2014-12-03 ENCOUNTER — Ambulatory Visit (INDEPENDENT_AMBULATORY_CARE_PROVIDER_SITE_OTHER): Payer: Medicare Other | Admitting: Podiatrist

## 2014-12-03 ENCOUNTER — Encounter: Payer: Self-pay | Admitting: Podiatrist

## 2014-12-03 VITALS — BP 122/82 | HR 99 | Temp 97.9°F | Resp 14

## 2014-12-03 DIAGNOSIS — L97514 Non-pressure chronic ulcer of other part of right foot with necrosis of bone: Secondary | ICD-10-CM

## 2014-12-03 DIAGNOSIS — I739 Peripheral vascular disease, unspecified: Secondary | ICD-10-CM

## 2014-12-03 DIAGNOSIS — L89891 Pressure ulcer of other site, stage 1: Secondary | ICD-10-CM | POA: Diagnosis not present

## 2014-12-03 DIAGNOSIS — L97511 Non-pressure chronic ulcer of other part of right foot limited to breakdown of skin: Secondary | ICD-10-CM

## 2014-12-03 NOTE — Patient Instructions (Signed)
You may go back to applying the iodosorb once a day to the ulcer locations on the right foot. -- cover with a gauze dressing-- no bandaids.

## 2014-12-11 ENCOUNTER — Ambulatory Visit
Admission: RE | Admit: 2014-12-11 | Discharge: 2014-12-11 | Disposition: A | Discharge status: Home / Self Care | Payer: Medicare Other | Source: Ambulatory Visit | Attending: Geriatric Medicine | Admitting: Geriatric Medicine

## 2014-12-11 ENCOUNTER — Encounter: Payer: Self-pay | Admitting: Cardiovascular Disease

## 2014-12-11 ENCOUNTER — Ambulatory Visit (INDEPENDENT_AMBULATORY_CARE_PROVIDER_SITE_OTHER): Payer: Medicare Other | Admitting: Cardiovascular Disease

## 2014-12-11 VITALS — BP 123/72 | HR 92 | Ht 66.0 in | Wt 145.0 lb

## 2014-12-11 DIAGNOSIS — N6320 Unspecified lump in the left breast, unspecified quadrant: Secondary | ICD-10-CM

## 2014-12-11 DIAGNOSIS — I739 Peripheral vascular disease, unspecified: Secondary | ICD-10-CM

## 2014-12-11 NOTE — Assessment & Plan Note (Signed)
History of critical limb ischemia with an ulcer and exposed bone on the right third toe and fifth toe. She was initially sent to me for vascular clearance prior to potential amputation however her right toe brachial index was 0.2. She had a high-grade right common femoral artery stenosis is also occluded popliteal and tibial vessels. I do not feel she was a good candidate for amputation and felt that she would not heal her wound. Apparently this has remained stable. She was recently placed on antibiotics. I will see her back as needed.

## 2014-12-11 NOTE — Patient Instructions (Signed)
Please follow up with Dr. Berry as needed. 

## 2014-12-11 NOTE — Progress Notes (Signed)
12/11/2014 Moody BruinsClarice V Goldwire   25-Jan-1924  [id number]  Primary Physician Ginette OttoSTONEKING,HAL THOMAS, MD Primary Cardiologist: Runell GessJonathan J. Krishawna Stiefel MD Roseanne RenoFACP,FACC,FAHA, FSCAI   HPI:  [name] is an 81 year old frail-appearing Caucasian female who is accompanied by her daughter today. She was referred by Dr. Irving ShowsEgerton , podiatrist, for peripheral vascular evaluation prior to elective right third toe amputation. I initially saw her in the office 09/09/14. Her past history is notable for diastolic dysfunction with normal EF, moderate 8 aortic stenosis, moderate mitral regurgitation as well as chronic atrial fibrillation on oral anti-coagulation. Her other problems include a history of hypertension, hyperlipidemia, diabetes and sleep apnea. She is a DO NOT RESUSCITATE. She is primarily wheelchair-bound. She has a right third toe ulcer with exposed bone and has been treated aggressively with local therapy for over 6 months. Lower extremity artery Doppler studies performed in our office 09/03/14 revealed a right brachial index of 0.2 suggesting that she did not have enough perfusion to heal an amputation wound. She had a high-grade right common femoral artery stenosis with an occluded right popliteal artery and tibial vessels as well. She has been treated by Dr. Irving ShowsEgerton aggressively with local care. I do not believe she has had progression of her ischemic ulcers.   Current Outpatient Prescriptions  Medication Sig Dispense Refill  . acetaminophen (TYLENOL) 500 MG tablet Take 500 mg by mouth every 8 (eight) hours as needed for moderate pain. Give with the Tramadol 50 mg    . allopurinol (ZYLOPRIM) 300 MG tablet Take 1 tablet (300 mg total) by mouth daily. 60 tablet 1  . apixaban (ELIQUIS) 2.5 MG TABS tablet Take 1 tablet (2.5 mg total) by mouth 2 (two) times daily. 60 tablet 0  . atorvastatin (LIPITOR) 10 MG tablet Take 1 tablet (10 mg total) by mouth daily. 30 tablet 1  . Calcium Carbonate-Vitamin D  (CALCIUM-VITAMIN D) 600-200 MG-UNIT CAPS Take 1 tablet by mouth daily.     Marland Kitchen. doxycycline (VIBRA-TABS) 100 MG tablet Take 1 tablet (100 mg total) by mouth 2 (two) times daily. 20 tablet 0  . furosemide (LASIX) 40 MG tablet Take 40-80 mg by mouth 2 (two) times daily. Take 40 mg by mouth twice daily, alternating every other day with 80 mg by mouth in the morning and 40 mg by mouth in the evening.    . gabapentin (NEURONTIN) 300 MG capsule Take 1 capsule (300 mg total) by mouth at bedtime. 30 capsule 1  . HYDROcodone-acetaminophen (NORCO/VICODIN) 5-325 MG per tablet Take 1 tablet by mouth 3 (three) times daily as needed.  0  . ketoconazole (NIZORAL) 2 % cream     . ketotifen (ZADITOR) 0.025 % ophthalmic solution Place 1 drop into both eyes daily as needed (for irritation).    . Lancets (FREESTYLE) lancets 1 each by Other route See admin instructions. Check blood sugar once daily.    Marland Kitchen. levothyroxine (SYNTHROID, LEVOTHROID) 112 MCG tablet Take 112 mcg by mouth daily before breakfast.    . metoprolol succinate (TOPROL-XL) 25 MG 24 hr tablet Take 0.5 tablets (12.5 mg total) by mouth daily. 15 tablet 1  . Multiple Vitamins-Minerals (MULTIVITAMIN WITH MINERALS) tablet Take 1 tablet by mouth daily.    . ONE TOUCH ULTRA TEST test strip 1 each by Other route See admin instructions. Check blood sugar once daily.    . potassium chloride (K-DUR,KLOR-CON) 10 MEQ tablet Take 2 tablets (20 mEq total) by mouth 2 (two) times daily. 360 tablet 3  .  traMADol (ULTRAM) 50 MG tablet Take 50 mg by mouth every 8 (eight) hours as needed for moderate pain. Give with Tylenol 500 mg     No current facility-administered medications for this visit.    Allergies  Allergen Reactions  . Ambien [Zolpidem Tartrate] Other (See Comments)    Pt does not wakeup from [place]  . Aspirin Other (See Comments)    Tears my stomach up; "can take coated Aspirin qd without problems"  . Nexium [Esomeprazole Magnesium] Rash  . Penicillins Rash  and Other (See Comments)    Tolerates Rocephin    History   Social History  . Marital Status: Widowed    Spouse Name: N/A  . Number of Children: 3  . Years of Education: 8th   Occupational History  . RETIRED     [employer]   Social History Main Topics  . Smoking status: Never Smoker   . Smokeless tobacco: Never Used  . Alcohol Use: No  . Drug Use: No  . Sexual Activity: No   Other Topics Concern  . Not on file   Social History Narrative   Retired. Lives alone. 3 adult children. No tobacco use. Nondrinker. No illicit drug use.    Caffeine Use: none     Review of Systems: General: negative for chills, fever, night sweats or weight changes.  Cardiovascular: negative for chest pain, dyspnea on exertion, edema, orthopnea, palpitations, paroxysmal nocturnal dyspnea or shortness of breath Dermatological: negative for rash Respiratory: negative for cough or wheezing Urologic: negative for hematuria Abdominal: negative for nausea, vomiting, diarrhea, bright red blood per rectum, melena, or hematemesis Neurologic: negative for visual changes, syncope, or dizziness All other systems reviewed and are otherwise negative except as noted above.    Blood pressure 123/72, pulse 92, height  (1.676 m), weight 145 lb (65.772 kg).  General appearance: alert and no distress Neck: no adenopathy, no carotid bruit, no JVD, supple, symmetrical, trachea midline and thyroid not enlarged, symmetric, no tenderness/mass/nodules Lungs: clear to auscultation bilaterally Heart: soft outflow tract murmur consistent with aortic stenosis Extremities: extremities normal, atraumatic, no cyanosis or edema and gangrenous right third and fifth toes  EKG not performed today  ASSESSMENT AND PLAN:   Peripheral arterial disease History of critical limb ischemia with an ulcer and exposed bone on the right third toe and fifth toe. She was initially sent to me for vascular clearance prior to potential  amputation however her right toe brachial index was 0.2. She had a high-grade right common femoral artery stenosis is also occluded popliteal and tibial vessels. I do not feel she was a good candidate for amputation and felt that she would not heal her wound. Apparently this has remained stable. She was recently placed on antibiotics. I will see her back as needed.       Runell Gess MD FACP,FACC,FAHA, [facility] 12/11/2014 4:54 PM

## 2014-12-18 ENCOUNTER — Ambulatory Visit (INDEPENDENT_AMBULATORY_CARE_PROVIDER_SITE_OTHER): Payer: Medicare Other | Admitting: Podiatrist

## 2014-12-18 DIAGNOSIS — I739 Peripheral vascular disease, unspecified: Secondary | ICD-10-CM | POA: Diagnosis not present

## 2014-12-18 DIAGNOSIS — E1140 Type 2 diabetes mellitus with diabetic neuropathy, unspecified: Secondary | ICD-10-CM

## 2014-12-18 DIAGNOSIS — B351 Tinea unguium: Secondary | ICD-10-CM

## 2014-12-18 DIAGNOSIS — M216X2 Other acquired deformities of left foot: Secondary | ICD-10-CM

## 2014-12-18 DIAGNOSIS — L97514 Non-pressure chronic ulcer of other part of right foot with necrosis of bone: Secondary | ICD-10-CM

## 2014-12-18 DIAGNOSIS — M79673 Pain in unspecified foot: Secondary | ICD-10-CM

## 2014-12-18 DIAGNOSIS — Q828 Other specified congenital malformations of skin: Secondary | ICD-10-CM

## 2014-12-18 MED ORDER — SILVER SULFADIAZINE 1 % EX CREA: 1.0000 "application " | TOPICAL_CREAM | Freq: Every day | CUTANEOUS | Status: DC

## 2014-12-18 NOTE — Progress Notes (Signed)
Patient presents today for follow up of ulceration on the tip of the right 3rd toe, lateral right fifth toe and medial first metatarsal head . She has been applying iodosorb to the ulcerations. She had a vascular study and a follow up appointment with Dr. York RamJonathan Barry who recommended no intervention due to her poor vascular supply and inabilty to do any type of intervention on these blocked vessels.    Objective: Neurovascular status unchanged. Pulses are non palpable with swelling to the right foot noted and neurological sensation is decreased. the ulceration on the distal tip of the right third toe is very slightly improved but there is still an ulcer present with swelling to the toe noted.  Ulceration medial right first metatarsal head present measuring 6mm in diameter with fibrogranular base.  Large callus submet 1 left with hemorrhagic tissue underlying.  Once debrided intact integument is noted.  Toenails are long, thick , discolored and mycotic bilateral  Assessment: ulcer down to bone distal tip of right 3rd toe, ulcer lateral fifth toe, ulcer medial right foot, porokeratosis, PVD.  Plan:  Recommended conservative care only.  Will continue iodosorb gel alternated with silvadene cream.  If she notices any redness, swelling, drainage, darkening discoloration or concerns, she is instructed to call.toenails debrided at today's visit without complication as well as the callus.  She will return in 3 weeks for follow up.

## 2014-12-20 NOTE — Progress Notes (Signed)
Patient presents today for follow up of ulcerations on the right foot.  Primary ulceration on the tip of the right 3rd toe and a newer ulcer lateral fifth toe. She has been applying iodosorb to the ulcerations. She had a vascular study and a follow up appointment with Dr. York RamJonathan Barry who recommended no intervention due to her poor vascular supply and inabilty to do any type of intervention on these blocked vessels.    Objective: Neurovascular status unchanged. Pulses are non palpable and neurological sensation is decreased. the ulceration on the distal tip of the right third toe is unchanged and now has exposed bone at the distal tip  Measures 4x 4x423mm. There is some mild redness and swelling to the right third toe itself right fifth toe has also ulcerated and is superficial  It has been unchanged since the ulceration developed.  Assessment: ulcer down to bone distal tip of right 3rd toe, PVD.  Plan:  Recommended conservative care only.  Will continue iodosorb gel.  If she notices any redness, swelling, drainage, darkening discoloration or concerns, she is instructed to call. I will see her back every 6 weeks for evaluation and will trim her toenails at the next visit if needed.

## 2014-12-26 ENCOUNTER — Encounter (HOSPITAL_COMMUNITY): Payer: Self-pay | Admitting: Emergency Medicine

## 2014-12-26 ENCOUNTER — Inpatient Hospital Stay (HOSPITAL_COMMUNITY): Payer: Medicare Other

## 2014-12-26 ENCOUNTER — Emergency Department (HOSPITAL_COMMUNITY): Payer: Medicare Other

## 2014-12-26 ENCOUNTER — Inpatient Hospital Stay (HOSPITAL_COMMUNITY)
Admission: EM | Admit: 2014-12-26 | Discharge: 2015-01-04 | DRG: 682 | Disposition: A | Discharge status: Home Health | Payer: Medicare Other | Attending: Internal Medicine | Admitting: Internal Medicine

## 2014-12-26 ENCOUNTER — Telehealth: Payer: Self-pay | Admitting: Physician Assistant

## 2014-12-26 ENCOUNTER — Other Ambulatory Visit (HOSPITAL_COMMUNITY): Payer: Self-pay

## 2014-12-26 DIAGNOSIS — I739 Peripheral vascular disease, unspecified: Secondary | ICD-10-CM | POA: Diagnosis present

## 2014-12-26 DIAGNOSIS — Z96653 Presence of artificial knee joint, bilateral: Secondary | ICD-10-CM | POA: Diagnosis present

## 2014-12-26 DIAGNOSIS — I482 Chronic atrial fibrillation: Secondary | ICD-10-CM | POA: Diagnosis present

## 2014-12-26 DIAGNOSIS — M1990 Unspecified osteoarthritis, unspecified site: Secondary | ICD-10-CM | POA: Diagnosis present

## 2014-12-26 DIAGNOSIS — N179 Acute kidney failure, unspecified: Secondary | ICD-10-CM | POA: Diagnosis present

## 2014-12-26 DIAGNOSIS — I272 Other secondary pulmonary hypertension: Secondary | ICD-10-CM | POA: Diagnosis present

## 2014-12-26 DIAGNOSIS — E875 Hyperkalemia: Secondary | ICD-10-CM | POA: Diagnosis present

## 2014-12-26 DIAGNOSIS — Z880 Allergy status to penicillin: Secondary | ICD-10-CM

## 2014-12-26 DIAGNOSIS — I129 Hypertensive chronic kidney disease with stage 1 through stage 4 chronic kidney disease, or unspecified chronic kidney disease: Secondary | ICD-10-CM | POA: Diagnosis present

## 2014-12-26 DIAGNOSIS — S90934A Unspecified superficial injury of right lesser toe(s), initial encounter: Secondary | ICD-10-CM | POA: Diagnosis present

## 2014-12-26 DIAGNOSIS — I5033 Acute on chronic diastolic (congestive) heart failure: Secondary | ICD-10-CM | POA: Diagnosis not present

## 2014-12-26 DIAGNOSIS — N390 Urinary tract infection, site not specified: Secondary | ICD-10-CM

## 2014-12-26 DIAGNOSIS — R7989 Other specified abnormal findings of blood chemistry: Secondary | ICD-10-CM | POA: Diagnosis not present

## 2014-12-26 DIAGNOSIS — I480 Paroxysmal atrial fibrillation: Secondary | ICD-10-CM | POA: Diagnosis present

## 2014-12-26 DIAGNOSIS — F0390 Unspecified dementia without behavioral disturbance: Secondary | ICD-10-CM | POA: Diagnosis present

## 2014-12-26 DIAGNOSIS — Z961 Presence of intraocular lens: Secondary | ICD-10-CM | POA: Diagnosis present

## 2014-12-26 DIAGNOSIS — G8929 Other chronic pain: Secondary | ICD-10-CM | POA: Diagnosis present

## 2014-12-26 DIAGNOSIS — Z886 Allergy status to analgesic agent status: Secondary | ICD-10-CM

## 2014-12-26 DIAGNOSIS — E876 Hypokalemia: Secondary | ICD-10-CM | POA: Diagnosis present

## 2014-12-26 DIAGNOSIS — I452 Bifascicular block: Secondary | ICD-10-CM | POA: Diagnosis present

## 2014-12-26 DIAGNOSIS — Z993 Dependence on wheelchair: Secondary | ICD-10-CM | POA: Diagnosis not present

## 2014-12-26 DIAGNOSIS — E780 Pure hypercholesterolemia: Secondary | ICD-10-CM | POA: Diagnosis present

## 2014-12-26 DIAGNOSIS — Z66 Do not resuscitate: Secondary | ICD-10-CM | POA: Diagnosis present

## 2014-12-26 DIAGNOSIS — Z9841 Cataract extraction status, right eye: Secondary | ICD-10-CM

## 2014-12-26 DIAGNOSIS — Z79899 Other long term (current) drug therapy: Secondary | ICD-10-CM | POA: Diagnosis not present

## 2014-12-26 DIAGNOSIS — L97509 Non-pressure chronic ulcer of other part of unspecified foot with unspecified severity: Secondary | ICD-10-CM | POA: Diagnosis present

## 2014-12-26 DIAGNOSIS — I080 Rheumatic disorders of both mitral and aortic valves: Secondary | ICD-10-CM | POA: Diagnosis present

## 2014-12-26 DIAGNOSIS — J45909 Unspecified asthma, uncomplicated: Secondary | ICD-10-CM | POA: Diagnosis present

## 2014-12-26 DIAGNOSIS — M109 Gout, unspecified: Secondary | ICD-10-CM | POA: Diagnosis present

## 2014-12-26 DIAGNOSIS — R0602 Shortness of breath: Secondary | ICD-10-CM | POA: Insufficient documentation

## 2014-12-26 DIAGNOSIS — Z888 Allergy status to other drugs, medicaments and biological substances status: Secondary | ICD-10-CM

## 2014-12-26 DIAGNOSIS — E871 Hypo-osmolality and hyponatremia: Secondary | ICD-10-CM | POA: Diagnosis present

## 2014-12-26 DIAGNOSIS — S90931A Unspecified superficial injury of right great toe, initial encounter: Secondary | ICD-10-CM | POA: Diagnosis present

## 2014-12-26 DIAGNOSIS — Z9842 Cataract extraction status, left eye: Secondary | ICD-10-CM

## 2014-12-26 DIAGNOSIS — N183 Chronic kidney disease, stage 3 (moderate): Secondary | ICD-10-CM | POA: Diagnosis present

## 2014-12-26 DIAGNOSIS — E1143 Type 2 diabetes mellitus with diabetic autonomic (poly)neuropathy: Secondary | ICD-10-CM | POA: Diagnosis present

## 2014-12-26 DIAGNOSIS — Z7901 Long term (current) use of anticoagulants: Secondary | ICD-10-CM

## 2014-12-26 DIAGNOSIS — Z90710 Acquired absence of both cervix and uterus: Secondary | ICD-10-CM

## 2014-12-26 DIAGNOSIS — I350 Nonrheumatic aortic (valve) stenosis: Secondary | ICD-10-CM

## 2014-12-26 DIAGNOSIS — R531 Weakness: Secondary | ICD-10-CM

## 2014-12-26 DIAGNOSIS — E039 Hypothyroidism, unspecified: Secondary | ICD-10-CM | POA: Diagnosis present

## 2014-12-26 DIAGNOSIS — I4892 Unspecified atrial flutter: Secondary | ICD-10-CM | POA: Diagnosis present

## 2014-12-26 DIAGNOSIS — I509 Heart failure, unspecified: Secondary | ICD-10-CM

## 2014-12-26 DIAGNOSIS — J9601 Acute respiratory failure with hypoxia: Secondary | ICD-10-CM | POA: Diagnosis present

## 2014-12-26 DIAGNOSIS — I248 Other forms of acute ischemic heart disease: Secondary | ICD-10-CM | POA: Diagnosis present

## 2014-12-26 DIAGNOSIS — G4733 Obstructive sleep apnea (adult) (pediatric): Secondary | ICD-10-CM | POA: Diagnosis present

## 2014-12-26 DIAGNOSIS — I5030 Unspecified diastolic (congestive) heart failure: Secondary | ICD-10-CM

## 2014-12-26 DIAGNOSIS — R778 Other specified abnormalities of plasma proteins: Secondary | ICD-10-CM

## 2014-12-26 DIAGNOSIS — R230 Cyanosis: Secondary | ICD-10-CM | POA: Diagnosis not present

## 2014-12-26 LAB — I-STAT ARTERIAL BLOOD GAS, ED
ACID-BASE DEFICIT: 5 mmol/L — AB (ref 0.0–2.0)
Bicarbonate: 18.8 mEq/L — ABNORMAL LOW (ref 20.0–24.0)
O2 Saturation: 100 %
PCO2 ART: 29 mmHg — AB (ref 35.0–45.0)
TCO2: 20 mmol/L (ref 0–100)
pH, Arterial: 7.418 (ref 7.350–7.450)
pO2, Arterial: 220 mmHg — ABNORMAL HIGH (ref 80.0–100.0)

## 2014-12-26 LAB — CBC WITH DIFFERENTIAL/PLATELET
BASOS PCT: 0 % (ref 0–1)
Basophils Absolute: 0 10*3/uL (ref 0.0–0.1)
EOS ABS: 0 10*3/uL (ref 0.0–0.7)
Eosinophils Relative: 0 % (ref 0–5)
HCT: 38 % (ref 36.0–46.0)
Hemoglobin: 12.7 g/dL (ref 12.0–15.0)
LYMPHS PCT: 15 % (ref 12–46)
Lymphs Abs: 1.7 10*3/uL (ref 0.7–4.0)
MCH: 32.7 pg (ref 26.0–34.0)
MCHC: 33.4 g/dL (ref 30.0–36.0)
MCV: 97.9 fL (ref 78.0–100.0)
Monocytes Absolute: 1 10*3/uL (ref 0.1–1.0)
Monocytes Relative: 9 % (ref 3–12)
NEUTROS PCT: 76 % (ref 43–77)
Neutro Abs: 8.6 10*3/uL — ABNORMAL HIGH (ref 1.7–7.7)
PLATELETS: 202 10*3/uL (ref 150–400)
RBC: 3.88 MIL/uL (ref 3.87–5.11)
RDW: 17.4 % — ABNORMAL HIGH (ref 11.5–15.5)
WBC: 11.3 10*3/uL — ABNORMAL HIGH (ref 4.0–10.5)

## 2014-12-26 LAB — URINE MICROSCOPIC-ADD ON

## 2014-12-26 LAB — URINALYSIS, ROUTINE W REFLEX MICROSCOPIC
BILIRUBIN URINE: NEGATIVE
Glucose, UA: NEGATIVE mg/dL
KETONES UR: NEGATIVE mg/dL
NITRITE: POSITIVE — AB
Protein, ur: NEGATIVE mg/dL
Specific Gravity, Urine: 1.01 (ref 1.005–1.030)
Urobilinogen, UA: 0.2 mg/dL (ref 0.0–1.0)
pH: 6 (ref 5.0–8.0)

## 2014-12-26 LAB — TROPONIN I: TROPONIN I: 0.09 ng/mL — AB (ref <0.031)

## 2014-12-26 LAB — COMPREHENSIVE METABOLIC PANEL
ALK PHOS: 110 U/L (ref 39–117)
ALT: 22 U/L (ref 0–35)
AST: 35 U/L (ref 0–37)
Albumin: 3.3 g/dL — ABNORMAL LOW (ref 3.5–5.2)
Anion gap: 14 (ref 5–15)
BUN: 60 mg/dL — AB (ref 6–23)
CALCIUM: 10 mg/dL (ref 8.4–10.5)
CO2: 25 mmol/L (ref 19–32)
CREATININE: 1.87 mg/dL — AB (ref 0.50–1.10)
Chloride: 96 mmol/L (ref 96–112)
GFR calc non Af Amer: 23 mL/min — ABNORMAL LOW (ref >90)
GFR, EST AFRICAN AMERICAN: 27 mL/min — AB (ref >90)
Glucose, Bld: 165 mg/dL — ABNORMAL HIGH (ref 70–99)
Potassium: 6.2 mmol/L (ref 3.5–5.1)
Sodium: 135 mmol/L (ref 135–145)
Total Bilirubin: 0.9 mg/dL (ref 0.3–1.2)
Total Protein: 7.2 g/dL (ref 6.0–8.3)

## 2014-12-26 LAB — CARBOXYHEMOGLOBIN
Carboxyhemoglobin: 0.5 % (ref 0.5–1.5)
Methemoglobin: 0.7 % (ref 0.0–1.5)
O2 SAT: 98.9 %
TOTAL HEMOGLOBIN: 12 g/dL (ref 12.0–16.0)

## 2014-12-26 LAB — BRAIN NATRIURETIC PEPTIDE: B Natriuretic Peptide: 1404.5 pg/mL — ABNORMAL HIGH (ref 0.0–100.0)

## 2014-12-26 MED ORDER — SODIUM CHLORIDE 0.9 % IV BOLUS (SEPSIS)
500.0000 mL | Freq: Once | INTRAVENOUS | Status: AC
  Administered 2014-12-26: 500 mL via INTRAVENOUS

## 2014-12-26 MED ORDER — ALLOPURINOL 300 MG PO TABS
300.0000 mg | ORAL_TABLET | Freq: Every day | ORAL | Status: DC
  Administered 2014-12-27 – 2015-01-04 (×9): 300 mg via ORAL
  Filled 2014-12-26 (×9): qty 1

## 2014-12-26 MED ORDER — FUROSEMIDE 10 MG/ML IJ SOLN
40.0000 mg | Freq: Two times a day (BID) | INTRAMUSCULAR | Status: DC
  Administered 2014-12-27: 40 mg via INTRAVENOUS
  Filled 2014-12-26 (×3): qty 4

## 2014-12-26 MED ORDER — APIXABAN 2.5 MG PO TABS
2.5000 mg | ORAL_TABLET | Freq: Two times a day (BID) | ORAL | Status: DC
  Administered 2014-12-27 – 2015-01-04 (×17): 2.5 mg via ORAL
  Filled 2014-12-26 (×19): qty 1

## 2014-12-26 MED ORDER — CALCIUM CARBONATE-VITAMIN D 500-200 MG-UNIT PO TABS
1.0000 | ORAL_TABLET | Freq: Every day | ORAL | Status: DC
  Administered 2014-12-27 – 2015-01-04 (×9): 1 via ORAL
  Filled 2014-12-26 (×9): qty 1

## 2014-12-26 MED ORDER — GABAPENTIN 300 MG PO CAPS
300.0000 mg | ORAL_CAPSULE | Freq: Every day | ORAL | Status: DC
  Administered 2014-12-27 – 2015-01-03 (×8): 300 mg via ORAL
  Filled 2014-12-26 (×10): qty 1

## 2014-12-26 MED ORDER — SODIUM POLYSTYRENE SULFONATE 15 GM/60ML PO SUSP
15.0000 g | Freq: Once | ORAL | Status: AC
  Administered 2014-12-26: 15 g via ORAL
  Filled 2014-12-26: qty 60

## 2014-12-26 MED ORDER — SILVER SULFADIAZINE 1 % EX CREA
1.0000 "application " | TOPICAL_CREAM | CUTANEOUS | Status: DC
  Filled 2014-12-26: qty 85

## 2014-12-26 MED ORDER — CADEXOMER IODINE 0.9 % EX GEL
1.0000 "application " | CUTANEOUS | Status: DC
  Administered 2014-12-27 – 2015-01-04 (×5): 1 via TOPICAL
  Filled 2014-12-26: qty 40

## 2014-12-26 MED ORDER — SODIUM CHLORIDE 0.9 % IV BOLUS (SEPSIS)
1000.0000 mL | Freq: Once | INTRAVENOUS | Status: AC
  Administered 2014-12-26: 1000 mL via INTRAVENOUS

## 2014-12-26 MED ORDER — SODIUM CHLORIDE 0.9 % IJ SOLN: 3.0000 mL | INTRAMUSCULAR | Status: DC | PRN

## 2014-12-26 MED ORDER — SODIUM CHLORIDE 0.9 % IJ SOLN
3.0000 mL | Freq: Two times a day (BID) | INTRAMUSCULAR | Status: DC
  Administered 2014-12-28 – 2015-01-03 (×12): 3 mL via INTRAVENOUS

## 2014-12-26 MED ORDER — SODIUM CHLORIDE 0.9 % IV SOLN: 250.0000 mL | INTRAVENOUS | Status: DC | PRN

## 2014-12-26 MED ORDER — INSULIN ASPART 100 UNIT/ML ~~LOC~~ SOLN
0.0000 [IU] | Freq: Three times a day (TID) | SUBCUTANEOUS | Status: DC
  Administered 2014-12-27: 1 [IU] via SUBCUTANEOUS
  Administered 2014-12-27 – 2014-12-28 (×2): 2 [IU] via SUBCUTANEOUS
  Administered 2014-12-28: 5 [IU] via SUBCUTANEOUS
  Administered 2014-12-29: 1 [IU] via SUBCUTANEOUS
  Administered 2014-12-29: 2 [IU] via SUBCUTANEOUS
  Administered 2014-12-29: 1 [IU] via SUBCUTANEOUS
  Administered 2014-12-30: 2 [IU] via SUBCUTANEOUS
  Administered 2014-12-30 – 2014-12-31 (×2): 1 [IU] via SUBCUTANEOUS
  Administered 2014-12-31: 2 [IU] via SUBCUTANEOUS
  Administered 2015-01-01 (×2): 1 [IU] via SUBCUTANEOUS
  Administered 2015-01-02 – 2015-01-03 (×3): 2 [IU] via SUBCUTANEOUS
  Administered 2015-01-03 – 2015-01-04 (×2): 1 [IU] via SUBCUTANEOUS

## 2014-12-26 MED ORDER — ACETAMINOPHEN 325 MG PO TABS
650.0000 mg | ORAL_TABLET | Freq: Four times a day (QID) | ORAL | Status: DC | PRN
  Administered 2014-12-29 – 2015-01-03 (×7): 650 mg via ORAL
  Filled 2014-12-26 (×7): qty 2

## 2014-12-26 MED ORDER — SODIUM CHLORIDE 0.9 % IV SOLN: INTRAVENOUS | Status: DC

## 2014-12-26 MED ORDER — DEXTROSE 5 % IV SOLN
1.0000 g | Freq: Once | INTRAVENOUS | Status: AC
  Administered 2014-12-26: 1 g via INTRAVENOUS
  Filled 2014-12-26: qty 10

## 2014-12-26 MED ORDER — ONDANSETRON HCL 4 MG PO TABS: 4.0000 mg | ORAL_TABLET | Freq: Four times a day (QID) | ORAL | Status: DC | PRN

## 2014-12-26 MED ORDER — ONDANSETRON HCL 4 MG/2ML IJ SOLN: 4.0000 mg | Freq: Four times a day (QID) | INTRAMUSCULAR | Status: DC | PRN

## 2014-12-26 MED ORDER — SODIUM CHLORIDE 0.9 % IJ SOLN
3.0000 mL | Freq: Two times a day (BID) | INTRAMUSCULAR | Status: DC
  Administered 2014-12-28 – 2015-01-02 (×3): 3 mL via INTRAVENOUS

## 2014-12-26 MED ORDER — ATORVASTATIN CALCIUM 10 MG PO TABS
10.0000 mg | ORAL_TABLET | Freq: Every day | ORAL | Status: DC
  Administered 2014-12-27: 10 mg via ORAL
  Filled 2014-12-26 (×2): qty 1

## 2014-12-26 MED ORDER — SODIUM POLYSTYRENE SULFONATE 15 GM/60ML PO SUSP
15.0000 g | Freq: Once | ORAL | Status: AC
  Filled 2014-12-26: qty 60

## 2014-12-26 MED ORDER — LEVOTHYROXINE SODIUM 112 MCG PO TABS
112.0000 ug | ORAL_TABLET | Freq: Every day | ORAL | Status: DC
  Administered 2014-12-27 – 2015-01-01 (×6): 112 ug via ORAL
  Filled 2014-12-26 (×8): qty 1

## 2014-12-26 MED ORDER — CEFTRIAXONE SODIUM IN DEXTROSE 20 MG/ML IV SOLN
1.0000 g | INTRAVENOUS | Status: DC
  Administered 2014-12-27 – 2014-12-29 (×3): 1 g via INTRAVENOUS
  Filled 2014-12-26 (×4): qty 50

## 2014-12-26 MED ORDER — ACETAMINOPHEN 650 MG RE SUPP: 650.0000 mg | Freq: Four times a day (QID) | RECTAL | Status: DC | PRN

## 2014-12-26 MED ORDER — METOPROLOL SUCCINATE 12.5 MG HALF TABLET
12.5000 mg | ORAL_TABLET | Freq: Every day | ORAL | Status: DC
Start: 2014-12-27 — End: 2015-01-04
  Administered 2014-12-27 – 2015-01-03 (×7): 12.5 mg via ORAL
  Filled 2014-12-26 (×9): qty 1

## 2014-12-26 MED ORDER — ADULT MULTIVITAMIN W/MINERALS CH
1.0000 | ORAL_TABLET | Freq: Every day | ORAL | Status: DC
  Administered 2014-12-27 – 2015-01-04 (×9): 1 via ORAL
  Filled 2014-12-26 (×9): qty 1

## 2014-12-26 MED ORDER — ONDANSETRON HCL 4 MG/2ML IJ SOLN: 4.0000 mg | Freq: Three times a day (TID) | INTRAMUSCULAR | Status: DC | PRN

## 2014-12-26 NOTE — H&P (Signed)
[facility] History and Physical  [name] ZOX:[id number] DOB: 05-11-1924 DOA: 12/26/2014   PCP: Ginette OttoSTONEKING,HAL THOMAS, MD  Specialists: Armanda Magicraci Turner, Cardiologist  Chief Complaint: Weakness  HPI: Judith BruinsClarice V Curling is a 81 y.o. woman with multiple comorbidities and an extensive cardiac history including chronic diastolic HF, mild aortic and mitral valve stenosis (last echo 12/2013), and atrial fibrillation (chronic anticoagulation with Eliquis) who was in her baseline state of health until 3-4 days ago.  Two daughters are at bedside and they confirm a 3-4 day history of progressive weakness and at least a six pound weight gain.  The patient reports that her legs have been weak, making it difficult to move around her home, even with the assistance of her caregivers (she has someone present 24 hours/day).  The patient denies any LUTS.  "I am cold all the time."  Denies fever, chills, or sweats.  No N/V.  No light-headedness.  No chest pain or shortness of breath.  Her daughter called her cardioloy office this morning to report the weight gain and was advised to give an extra dose of oral lasix (40mg ) and an extra dose of potassium.  The patient ended up in the ED due to weakness and is found to have a UTI and AKI with hyperkalemia.  She also has a borderline troponin level of 0.09, of unclear significance.  Of note, she has a new oxygen requirement of 4L [place].  She does not wear home oxygen, but she uses CPAP qHS for hx of OSA.  Chest xray suggestive of pulmonary edema and BNP elevated to 1400.  She is being admitted to the step down unit for further management.  Cardiology fellow has been consulted from the ED.  Review of Systems: 12 systems reviewed and negative except as stated in the HPI.  Past Medical History  Diagnosis Date  . Hypertension   . Peripheral neuropathy   . Asthma   . High cholesterol   . Diabetes with neurologic complications   . PAD (peripheral artery disease)    . Enlarged heart   . Anginal pain 05/28/12  . Pneumonia 11/2010  . Sleep apnea     cpap  . Hypothyroidism   . Headache(784.0) 05/29/12    "recently"  . Stroke 05/2005; 08/2010    !mini"  . Personal history of gout   . Hypothyroidism   . DJD (degenerative joint disease)   . Carpal tunnel syndrome, bilateral   . Recurrent labyrinthitis   . GERD (gastroesophageal reflux disease)     S/P esophageal dilation in 1996  . Gallstone     Silent  . Fibrocystic breast changes   . Hemorrhoids     with bleeding  . Hx of adenomatous colonic polyps     Dr Sherin QuarryWeissman  . Diverticulitis     left side  . MR (mitral regurgitation)     moderate  . Mild anemia     hemoglobin 11.8 on 06/2009  . Edema of lower extremity     Chronic  . Ischemic colitis 8/13  . Aortic stenosis, moderate   . Chronic diastolic CHF (congestive heart failure)   . Moderate mitral regurgitation   . CKD (chronic kidney disease) stage 3, GFR 30-59 ml/min     "something on labs always say she has some problems"  . Chronic atrial fibrillation   . Peripheral arterial disease     critical limb ischemia, right third toe   Past Surgical History  Procedure Laterality Date  .  Tonsillectomy and adenoidectomy  1960's  . Dilation and curettage of uterus    . Vaginal hysterectomy  1970's  . Total knee arthroplasty  1970-~2002    left; right  . Cataract extraction w/ intraocular lens  implant, bilateral  ?1970's  Recent kyphoplasty for compression fracture, L2 (December 2015)   Social History:  History   Social History Narrative   Retired. Lives alone. 3 adult children. No tobacco use. Nondrinker. No illicit drug use.    Caffeine Use: none   Allergies  Allergen Reactions  . Ambien [Zolpidem Tartrate] Other (See Comments)    Pt does not wakeup from [place]  . Aspirin Other (See Comments)    Tears my stomach up; "can take coated Aspirin qd without problems"  . Nexium [Esomeprazole Magnesium] Rash  . Penicillins Rash and  Other (See Comments)    Tolerates Rocephin    Family History  Problem Relation Age of Onset  . Heart disease Mother   . Heart disease Father   . Heart disease Brother    Prior to Admission medications   Medication Sig Start Date End Date Taking? Authorizing Provider  acetaminophen (TYLENOL) 500 MG tablet Take 500 mg by mouth at bedtime. Give with the Tramadol 50 mg   Yes Historical Provider, MD  allopurinol (ZYLOPRIM) 300 MG tablet Take 1 tablet (300 mg total) by mouth daily. 01/30/14  Yes Daniel J Angiulli, PA-C  apixaban (ELIQUIS) 2.5 MG TABS tablet Take 1 tablet (2.5 mg total) by mouth 2 (two) times daily. 02/08/14  Yes Shanker Levora Dredge, MD  atorvastatin (LIPITOR) 10 MG tablet Take 1 tablet (10 mg total) by mouth daily. Patient taking differently: Take 10 mg by mouth daily at 6 PM.  01/30/14  Yes Daniel J Angiulli, PA-C  Calcium Carbonate-Vitamin D (CALCIUM-VITAMIN D) 600-200 MG-UNIT CAPS Take 1 tablet by mouth daily.    Yes Historical Provider, MD  furosemide (LASIX) 40 MG tablet Take 40-80 mg by mouth 2 (two) times daily. Take 40 mg by mouth twice daily, alternating every other day with 80 mg by mouth in the morning and 40 mg by mouth in the evening. 02/08/14  Yes Shanker Levora Dredge, MD  gabapentin (NEURONTIN) 300 MG capsule Take 1 capsule (300 mg total) by mouth at bedtime. 01/30/14  Yes Daniel J Angiulli, PA-C  ketoconazole (NIZORAL) 2 % cream  10/21/14  Yes Historical Provider, MD  levothyroxine (SYNTHROID, LEVOTHROID) 112 MCG tablet Take 112 mcg by mouth daily before breakfast.   Yes Historical Provider, MD  metoprolol succinate (TOPROL-XL) 25 MG 24 hr tablet Take 0.5 tablets (12.5 mg total) by mouth daily. 01/30/14  Yes Daniel J Angiulli, PA-C  Multiple Vitamins-Minerals (MULTIVITAMIN WITH MINERALS) tablet Take 1 tablet by mouth daily.   Yes Historical Provider, MD  potassium chloride (K-DUR,KLOR-CON) 10 MEQ tablet Take 2 tablets (20 mEq total) by mouth 2 (two) times daily. 11/18/14  Yes  Dolores Patty, MD  PRESCRIPTION MEDICATION Apply 1 application topically every other day. Idosorb   Yes Historical Provider, MD  silver sulfADIAZINE (SILVADENE) 1 % cream Apply 1 application topically daily. Patient taking differently: Apply 1 application topically every other day.  12/18/14  Yes Delories Heinz, DPM  traMADol (ULTRAM) 50 MG tablet Take 50 mg by mouth every 8 (eight) hours as needed for moderate pain. Give with Tylenol 500 mg    Historical Provider, MD   Physical Exam: Filed Vitals:   12/26/14 1900 12/26/14 1930 12/26/14 2000 12/26/14 2030  BP: 118/64 108/69  110/73 115/77  Pulse: 104 101 102 104  Temp:      TempSrc:      Resp: SpO2: 98% 94% 84% 100%  Temp 97.6   General:  Awake and alert.  Oriented to person, place, time and situation.  NAD.  Frail appearing.  Eyes: PERRL bilaterally, conjunctiva are pale.  EOMI.  ENT: Mucous membranes slightly dry.  No nasal drainage.  Neck: Supple.  No carotid bruit. +JVD.  Cardiovascular: NR/RR.  No significant pitting in LE but legs are cool.  Right foot dressing intact.  Respiratory: Diminished bilaterally.  No wheeze.  Abdomen: Soft/NT/ND.  Bowel sounds are present.  No guarding.  Skin: Skin over BLE is waxy.  Musculoskeletal: Moves all four extremities spontaneously.  Psychiatric: Normal affect.  Neurologic: No focal deficits.  Labs on Admission:  Basic Metabolic Panel:  Recent Labs Lab 12/26/14 1800  NA 135  K 6.2*  CL 96  CO2 25  GLUCOSE 165*  BUN 60*  CREATININE 1.87*  CALCIUM 10.0   Liver Function Tests:  Recent Labs Lab 12/26/14 1800  AST 35  ALT 22  ALKPHOS 110  BILITOT 0.9  PROT 7.2  ALBUMIN 3.3*   CBC:  Recent Labs Lab 12/26/14 1800  WBC 11.3*  NEUTROABS 8.6*  HGB 12.7  HCT 38.0  MCV 97.9  PLT 202   Cardiac Enzymes:  Recent Labs Lab 12/26/14 1800  TROPONINI 0.09*    BNP (last 3 results)  Recent Labs  12/26/14 1800  BNP 1404.5*    Radiological Exams on Admission: Dg Chest Port 1 View  12/26/2014   CLINICAL DATA:  Shortness of breath  EXAM: PORTABLE CHEST - 1 VIEW  COMPARISON:  02/10/2014  FINDINGS: There is cardiomegaly. Mild vascular congestion. No overt edema. No confluent opacities or effusions. No acute bony abnormality. Dense mitral valve annular calcifications again noted.  IMPRESSION: Cardiomegaly, vascular congestion.   Electronically Signed   By: Charlett Nose M.D.   On: 12/26/2014 18:08    EKG: Independently reviewed. NSR.  Assessment/Plan Active Problems:   Acute kidney injury   UTI (lower urinary tract infection)   Hyperkalemia   Diastolic CHF, acute on chronic   Elevated troponin I level   1. Acute exacerbation of chronic diastolic heart failure --Admit to stepdown unit --Diuresis with IV lasix --Wean O2 as tolerated --ACE-I/ARB contraindicated due to elevated creatinine and hyperkalemia --1200cc fluid restriction --Strict I/O --Echo in AM --Cardiology consult pending  2.  UTI with AKI --Infection could be contributing but I think patient is pre-renal secondary to CHF.  Small fluid bolus given in the ED but will diurese while awaiting further recommendations from cardiology --IV Rocephin while urine cultures are pending --Watch BMP  3.  Hyperkalemia --No EKGs changes, kayexalate ordered.  Monitor closely on telemetry.  4.  Elevated troponin, nonspecific --Cardiology to address  5.  Hx of DM --Accuchecks with SSI  6. Continue wound care for chronic LE wounds   Code Status: DNR Family Communication: Two daughters and son-in-law at bedside  Time spent: 70 minutes  Jerene Bears [facility]  12/26/2014, 9:06 PM

## 2014-12-26 NOTE — ED Provider Notes (Signed)
CSN: [id number]     Arrival date & time 12/26/14  1653 History   First MD Initiated Contact with Patient 12/26/14 1658     Chief Complaint  Patient presents with  . Weakness     (Consider location/radiation/quality/duration/timing/severity/associated sxs/prior Treatment) HPI Comments: The patient is an 81 year old female, she does have a history of diastolic heart failure, a history of severe peripheral artery disease with right lower extremity arterial obstruction, a poorly healing ulcer to the third toe of the right foot. She has had bilateral total knee arthroplasty in the past. She reports that today while she was walking from her bathroom back to her chair she became acutely weak, her caregiver assisted her, she has been unable to ambulate because of weakness since that time. She is a very poor historian but states that this was not a focal weakness, was not accompanied with chest pain shortness of breath difficulty with vision or speech, no palpitations, no back pain. Her family members do report that she has had increased swelling and has had a 6 pound weight gain prompting her cardiologist to increase her Lasix over the last 24 hours. There has been no dysuria, diarrhea or rectal bleeding. The patient is on a novel oral anticoagulant for chronic atrial fibrillation.  Patient is a 81 y.o. female presenting with weakness. The history is provided by the patient, medical records, the EMS personnel and a relative.  Weakness    Past Medical History  Diagnosis Date  . Hypertension   . Peripheral neuropathy   . Asthma   . High cholesterol   . Diabetes with neurologic complications   . PAD (peripheral artery disease)   . Enlarged heart   . Anginal pain 05/28/12  . Pneumonia 11/2010  . Sleep apnea     cpap  . Hypothyroidism   . Headache(784.0) 05/29/12    "recently"  . Stroke 05/2005; 08/2010    !mini"  . Personal history of gout   . Hypothyroidism   . DJD (degenerative joint disease)    . Carpal tunnel syndrome, bilateral   . Recurrent labyrinthitis   . GERD (gastroesophageal reflux disease)     S/P esophageal dilation in 1996  . Gallstone     Silent  . Fibrocystic breast changes   . Hemorrhoids     with bleeding  . Hx of adenomatous colonic polyps     Dr Sherin Quarry  . Diverticulitis     left side  . MR (mitral regurgitation)     moderate  . Mild anemia     hemoglobin 11.8 on 06/2009  . Edema of lower extremity     Chronic  . Ischemic colitis 8/13  . Aortic stenosis, moderate   . Chronic diastolic CHF (congestive heart failure)   . Moderate mitral regurgitation   . CKD (chronic kidney disease) stage 3, GFR 30-59 ml/min     "something on labs always say she has some problems"  . Chronic atrial fibrillation   . Peripheral arterial disease     critical limb ischemia, right third toe   Past Surgical History  Procedure Laterality Date  . Tonsillectomy and adenoidectomy  1960's  . Dilation and curettage of uterus    . Vaginal hysterectomy  1970's  . Total knee arthroplasty  1970-~2002    left; right  . Cataract extraction w/ intraocular lens  implant, bilateral  ?1970's   Family History  Problem Relation Age of Onset  . Heart disease Mother   . Heart disease  Father   . Heart disease Brother    History  Substance Use Topics  . Smoking status: Never Smoker   . Smokeless tobacco: Never Used  . Alcohol Use: No   OB History    No data available     Review of Systems  Neurological: Positive for weakness.  All other systems reviewed and are negative.     Allergies  Ambien; Aspirin; Nexium; and Penicillins  Home Medications   Prior to Admission medications   Medication Sig Start Date End Date Taking? Authorizing Provider  acetaminophen (TYLENOL) 500 MG tablet Take 500 mg by mouth every 8 (eight) hours as needed for moderate pain. Give with the Tramadol 50 mg    Historical Provider, MD  allopurinol (ZYLOPRIM) 300 MG tablet Take 1 tablet (300 mg  total) by mouth daily. 01/30/14   Mcarthur Rossettianiel J Angiulli, PA-C  apixaban (ELIQUIS) 2.5 MG TABS tablet Take 1 tablet (2.5 mg total) by mouth 2 (two) times daily. 02/08/14   Shanker Levora DredgeM Ghimire, MD  atorvastatin (LIPITOR) 10 MG tablet Take 1 tablet (10 mg total) by mouth daily. 01/30/14   Mcarthur Rossettianiel J Angiulli, PA-C  Calcium Carbonate-Vitamin D (CALCIUM-VITAMIN D) 600-200 MG-UNIT CAPS Take 1 tablet by mouth daily.     Historical Provider, MD  doxycycline (VIBRA-TABS) 100 MG tablet Take 1 tablet (100 mg total) by mouth 2 (two) times daily. 11/19/14   Delories HeinzKathryn P Egerton, DPM  furosemide (LASIX) 40 MG tablet Take 40-80 mg by mouth 2 (two) times daily. Take 40 mg by mouth twice daily, alternating every other day with 80 mg by mouth in the morning and 40 mg by mouth in the evening. 02/08/14   Shanker Levora DredgeM Ghimire, MD  gabapentin (NEURONTIN) 300 MG capsule Take 1 capsule (300 mg total) by mouth at bedtime. 01/30/14   Mcarthur Rossettianiel J Angiulli, PA-C  HYDROcodone-acetaminophen (NORCO/VICODIN) 5-325 MG per tablet Take 1 tablet by mouth 3 (three) times daily as needed. 09/21/14   Historical Provider, MD  ketoconazole (NIZORAL) 2 % cream  10/21/14   Historical Provider, MD  ketotifen (ZADITOR) 0.025 % ophthalmic solution Place 1 drop into both eyes daily as needed (for irritation).    Historical Provider, MD  Lancets (FREESTYLE) lancets 1 each by Other route See admin instructions. Check blood sugar once daily. 05/15/14   Historical Provider, MD  levothyroxine (SYNTHROID, LEVOTHROID) 112 MCG tablet Take 112 mcg by mouth daily before breakfast.    Historical Provider, MD  metoprolol succinate (TOPROL-XL) 25 MG 24 hr tablet Take 0.5 tablets (12.5 mg total) by mouth daily. 01/30/14   Mcarthur Rossettianiel J Angiulli, PA-C  Multiple Vitamins-Minerals (MULTIVITAMIN WITH MINERALS) tablet Take 1 tablet by mouth daily.    Historical Provider, MD  ONE TOUCH ULTRA TEST test strip 1 each by Other route See admin instructions. Check blood sugar once daily. 05/15/14    Historical Provider, MD  potassium chloride (K-DUR,KLOR-CON) 10 MEQ tablet Take 2 tablets (20 mEq total) by mouth 2 (two) times daily. 11/18/14   Dolores Pattyaniel R Bensimhon, MD  silver sulfADIAZINE (SILVADENE) 1 % cream Apply 1 application topically daily. 12/18/14   Delories HeinzKathryn P Egerton, DPM  traMADol (ULTRAM) 50 MG tablet Take 50 mg by mouth every 8 (eight) hours as needed for moderate pain. Give with Tylenol 500 mg    Historical Provider, MD   BP 118/64 mmHg  Pulse 104  Temp(Src) 97.6 F (36.4 C) (Oral)  Resp 18  SpO2 98% Physical Exam  Constitutional: She appears well-developed and well-nourished. No distress.  HENT:  Head: Normocephalic and atraumatic.  Mouth/Throat: Oropharynx is clear and moist. No oropharyngeal exudate.  Eyes: Conjunctivae and EOM are normal. Pupils are equal, round, and reactive to light. Right eye exhibits no discharge. Left eye exhibits no discharge. No scleral icterus.  Neck: Normal range of motion. Neck supple. No JVD present. No thyromegaly present.  Cardiovascular: Normal rate, regular rhythm and intact distal pulses.  Exam reveals no gallop and no friction rub.   Murmur ( Soft systolic) heard. Pulmonary/Chest: Effort normal and breath sounds normal. No respiratory distress. She has no wheezes. She has no rales.  Abdominal: Soft. Bowel sounds are normal. She exhibits no distension and no mass. There is no tenderness.  Minimal suprapubic discomfort on exam, no other tenderness  Musculoskeletal: Normal range of motion. She exhibits edema (bilateral scant to mild symmetrical pitting edema of the lower extremities below the knee). She exhibits no tenderness ( Decreased range of motion of the bilateral knees secondary to pain, inability to straight leg raise on either side secondary to knee pain. No swelling of the joints).  Lymphadenopathy:    She has no cervical adenopathy.  Neurological: She is alert. Coordination normal.  Follows commands, speech is clear, memory is  intact  Skin: Skin is warm and dry. No rash noted. No erythema.  No skin breakdown  Psychiatric: She has a normal mood and affect. Her behavior is normal.  Nursing note and vitals reviewed.   ED Course  Procedures (including critical care time) Labs Review Labs Reviewed  BRAIN NATRIURETIC PEPTIDE - Abnormal; Notable for the following:    B Natriuretic Peptide 1404.5 (*)    All other components within normal limits  TROPONIN I - Abnormal; Notable for the following:    Troponin I 0.09 (*)    All other components within normal limits  CBC WITH DIFFERENTIAL/PLATELET - Abnormal; Notable for the following:    WBC 11.3 (*)    RDW 17.4 (*)    Neutro Abs 8.6 (*)    All other components within normal limits  COMPREHENSIVE METABOLIC PANEL - Abnormal; Notable for the following:    Potassium 6.2 (*)    Glucose, Bld 165 (*)    BUN 60 (*)    Creatinine, Ser 1.87 (*)    Albumin 3.3 (*)    GFR calc non Af Amer 23 (*)    GFR calc Af Amer 27 (*)    All other components within normal limits  URINALYSIS, ROUTINE W REFLEX MICROSCOPIC - Abnormal; Notable for the following:    APPearance CLOUDY (*)    Hgb urine dipstick SMALL (*)    Nitrite POSITIVE (*)    Leukocytes, UA LARGE (*)    All other components within normal limits  URINE MICROSCOPIC-ADD ON - Abnormal; Notable for the following:    Bacteria, UA MANY (*)    All other components within normal limits  URINE CULTURE    Imaging Review Dg Chest Port 1 View  12/26/2014   CLINICAL DATA:  Shortness of breath  EXAM: PORTABLE CHEST - 1 VIEW  COMPARISON:  02/10/2014  FINDINGS: There is cardiomegaly. Mild vascular congestion. No overt edema. No confluent opacities or effusions. No acute bony abnormality. Dense mitral valve annular calcifications again noted.  IMPRESSION: Cardiomegaly, vascular congestion.   Electronically Signed   By: Charlett Nose M.D.   On: 12/26/2014 18:08     EKG Interpretation   Date/Time:  Saturday December 26 2014  17:01:44 EST Ventricular Rate:  94 PR Interval:  237 QRS Duration: 173 QT Interval:  380 QTC Calculation: 475 R Axis:   105 Text Interpretation:  Sinus rhythm Prolonged PR interval RBBB and LPFB  Inferior infarct, recent since last tracing no significant change Abnormal  ekg Confirmed by Caelen Higinbotham  MD, Valin Massie ([id number]) on 12/26/2014 5:21:04 PM      MDM   Final diagnoses:  SOB (shortness of breath)  Hyperkalemia  Acute renal failure, unspecified acute renal failure type  UTI (lower urinary tract infection)    The etiology of the patient's generalized weakness is unclear, she is however hypoxic on my exam, her oxygen levels are between 80 and 85% on room air. She does not appear in distress and has no rales. She did not want to come to the hospital but is here because family members insisted. She does have DO NOT RESUSCITATE orders.  Labs, chest x-ray, cardiac monitoring, supplemental oxygen. Would consider congestive heart failure with pulmonary edema, electrolyte abnormalities, renal failure, anemia.  dw cardiology at 7:46 - they will see pt -   Hyperkalemia mild and treated with kayexalate.  UA ordered - smelled foul, has AKI - BUN up, likely source of weakness.  VS remain stable - mild tachy - fluids, antipicate medical admit.  The patient has an acute urinary tract infection with acute kidney injury, uremia and hyperkalemia, she also has a borderline elevated troponin which is likely secondary to the acute kidney injury or possibly mild congestive heart failure. Chest x-ray does reveal pulmonary vascular congestion. Care was discussed with cardiologist who will see the patient in consultation, recommends medical admission, vital signs have remained stable, internal medicine service hospitalist paged for admission,  Interventions:  Kayexalate  IV fluids  Rocephin  D/w Dr. Montez Morita who will admit  Vida Roller, MD 12/26/14 2033

## 2014-12-26 NOTE — ED Notes (Signed)
Home walking from the bathroom back to chair; felt weak and could not make it back to her chair. Her caregiver in the home assisted her to the ground. She did not fall, no LOC. Denies injury, denies pain. Denies symptoms at this time. Reports she did not want to come, but caregiver called EMS and her family and insisted on evaluation. Change in amount of lasix starting this morning due to a 4lb weight gain this week.

## 2014-12-26 NOTE — Consult Note (Signed)
CARDIOLOGY CONSULT NOTE  Primary cardiologist: Armanda Magic M.D.  Assessment and Plan:  *Elevated troponins:  [name] is an 81 year old female with history of HFpEF, atrial fibrillation, mild AS/MS, OSA on CPAP, severe peripheral arterial disease, type 2 diabetes, hypertension was brought to the emergency department for evaluation of sudden onset loss of energy and weakness. Patient is somewhat of a poor historian but daughter states the patient had no symptoms suggestive of acute coronary syndrome. Patient reportedly had gained 6 pounds over past 4 days; although, on exam patient does not appear to be profoundly volume overloaded. Her elevated troponins are likely due to acute renal failure. Do not suspect the patient's having acute coronary event at this point. In the ED, she was treated with 1.5 L of normal saline   We recommend: -- Defer workup for acute renal failure and hyperkalemia to the primary team -- Cycle cardiac enzymes 3. Monitor on telemetry -- Recommend continuing diuretic therapy like prescribed previously.  -- Continue renally dosed Eliquis for atrial fibrillation.  -- Continue atorvastatin and metoprolol succinate.  -- Please obtain a transthoracic echocardiogram. -- Although orthostatics can be falsely positive in elderly patient population, recommend getting orthostatics.   Thank you very much for this very interesting consult. We'll continue to follow along. Please do not hesitate to call the on-call cardiology patient with any questions.  Chief complaint: Elevated troponins, fall  HPI:  [name] is an 81 year old female with history of HFpEF, atrial fibrillation, mild AS/MS, OSA on CPAP, severe peripheral arterial disease, type 2 diabetes, hypertension was brought to the emergency department for evaluation of sudden onset loss of energy and weakness. Cardiac he was consulted due to elevated troponins. Patient has dementia and her history appears to be  somewhat unreliable. However, daughter states that patient had no history of chest pain, chest pressure during the event. Patient has history of heart failure with preserved ejection fraction and takes 40 mg BID with alternating  in AM. Daughter contacted cardiology Department this morning with concern for 6 pound weight gain in the last 4 days. She was asked to increase the Lasix dose to 80 twice a day today and tomorrow. Unfortunate, later in the day patient became acutely weak while walking from her bathroom back to her chair. She is unable to ambulate since that time. Due to persistent weakness, patient was brought to the emergency department.   In the emergency department, patient was noted to have acute renal failure with BUN 60, creatinine of 1.8 and potassium of 6.2.  Previous cardiac imaging  EKG: Normal sinus rhythm, right bundle branch block, left posterior fascicular block. Unchanged from prior  TTE 12/2013: LVEF 55-65, mild aortic stenosis, mild mitral stenosis, moderate MR  Past Medical History Past Medical History  Diagnosis Date  . Hypertension   . Peripheral neuropathy   . Asthma   . High cholesterol   . Diabetes with neurologic complications   . PAD (peripheral artery disease)   . Enlarged heart   . Anginal pain 05/28/12  . Pneumonia 11/2010  . Sleep apnea     cpap  . Hypothyroidism   . Headache(784.0) 05/29/12    "recently"  . Stroke 05/2005; 08/2010    !mini"  . Personal history of gout   . Hypothyroidism   . DJD (degenerative joint disease)   . Carpal tunnel syndrome, bilateral   . Recurrent labyrinthitis   . GERD (gastroesophageal reflux disease)     S/P esophageal dilation in 1996  .  Gallstone     Silent  . Fibrocystic breast changes   . Hemorrhoids     with bleeding  . Hx of adenomatous colonic polyps     Dr Sherin Quarry  . Diverticulitis     left side  . MR (mitral regurgitation)     moderate  . Mild anemia     hemoglobin 11.8 on 06/2009  . Edema of  lower extremity     Chronic  . Ischemic colitis 8/13  . Aortic stenosis, moderate   . Chronic diastolic CHF (congestive heart failure)   . Moderate mitral regurgitation   . CKD (chronic kidney disease) stage 3, GFR 30-59 ml/min     "something on labs always say she has some problems"  . Chronic atrial fibrillation   . Peripheral arterial disease     critical limb ischemia, right third toe    Allergies: Allergies  Allergen Reactions  . Ambien [Zolpidem Tartrate] Other (See Comments)    Pt does not wakeup from [place]  . Aspirin Other (See Comments)    Tears my stomach up; "can take coated Aspirin qd without problems"  . Nexium [Esomeprazole Magnesium] Rash  . Penicillins Rash and Other (See Comments)    Tolerates Rocephin   Medications: Allopurinol 300 mg daily Eliquis 2.5 mg twice a day Atorvastatin 10 mg daily Furosemide 80 mg in the morning 40 in the evening alternating with 40 mg twice a day Metoprolol succinate 12.5 mg daily  Potassium chloride 20 mEq daily  Social History History   Social History  . Marital Status: Widowed    Spouse Name: N/A  . Number of Children: 3  . Years of Education: 8th   Occupational History  . RETIRED     [employer]   Social History Main Topics  . Smoking status: Never Smoker   . Smokeless tobacco: Never Used  . Alcohol Use: No  . Drug Use: No  . Sexual Activity: No   Other Topics Concern  . Not on file   Social History Narrative   Retired. Lives alone. 3 adult children. No tobacco use. Nondrinker. No illicit drug use.    Caffeine Use: none    Family History Family History  Problem Relation Age of Onset  . Heart disease Mother   . Heart disease Father   . Heart disease Brother     Physical Exam Filed Vitals:   12/26/14 1900  BP: 118/64  Pulse: 104  Temp:   Resp: 18     Gen: Comfortable appearing in no acute distress HEENT: EOMI, moist mucous membranes Neck: Distended EJs, no carotid bruits CV: Normal S1 normal  S2, tachycardic, no obvious gallops Pulm: Bilateral lower lung field wheezes Abdomen: Soft, nontender, nondistended Ext: No peripheral edema  Labs:  Results for orders placed or performed during the hospital encounter of 12/26/14 (from the past 24 hour(s))  Troponin I     Status: Abnormal   Collection Time: 12/26/14  6:00 PM  Result Value Ref Range   Troponin I 0.09 (H) <0.031 ng/mL  CBC with Differential/Platelet     Status: Abnormal   Collection Time: 12/26/14  6:00 PM  Result Value Ref Range   WBC 11.3 (H) 4.0 - 10.5 K/uL   RBC 3.88 3.87 - 5.11 MIL/uL   Hemoglobin 12.7 12.0 - 15.0 g/dL   HCT 16.1 09.6 - 04.5 %   MCV 97.9 78.0 - 100.0 fL   MCH 32.7 26.0 - 34.0 pg   MCHC 33.4 30.0 - 36.0  g/dL   RDW 16.117.4 (H) 09.611.5 - 04.515.5 %   Platelets 202 150 - 400 K/uL   Neutrophils Relative % 76 43 - 77 %   Neutro Abs 8.6 (H) 1.7 - 7.7 K/uL   Lymphocytes Relative 15 12 - 46 %   Lymphs Abs 1.7 0.7 - 4.0 K/uL   Monocytes Relative 9 3 - 12 %   Monocytes Absolute 1.0 0.1 - 1.0 K/uL   Eosinophils Relative 0 0 - 5 %   Eosinophils Absolute 0.0 0.0 - 0.7 K/uL   Basophils Relative 0 0 - 1 %   Basophils Absolute 0.0 0.0 - 0.1 K/uL  Comprehensive metabolic panel     Status: Abnormal   Collection Time: 12/26/14  6:00 PM  Result Value Ref Range   Sodium 135 135 - 145 mmol/L   Potassium 6.2 (HH) 3.5 - 5.1 mmol/L   Chloride 96 96 - 112 mmol/L   CO2 25 19 - 32 mmol/L   Glucose, Bld 165 (H) 70 - 99 mg/dL   BUN 60 (H) 6 - 23 mg/dL   Creatinine, Ser 4.091.87 (H) 0.50 - 1.10 mg/dL   Calcium 81.110.0 8.4 - 91.410.5 mg/dL   Total Protein 7.2 6.0 - 8.3 g/dL   Albumin 3.3 (L) 3.5 - 5.2 g/dL   AST 35 0 - 37 U/L   ALT 22 0 - 35 U/L   Alkaline Phosphatase 110 39 - 117 U/L   Total Bilirubin 0.9 0.3 - 1.2 mg/dL   GFR calc non Af Amer 23 (L) >90 mL/min   GFR calc Af Amer 27 (L) >90 mL/min   Anion gap 14 5 - 15

## 2014-12-26 NOTE — ED Notes (Signed)
CRITICAL VALUE ALERT  Critical value received: Potasium 6.2 no hemolyze

## 2014-12-26 NOTE — ED Notes (Signed)
Carb  Modified diet ordered as order per Dr. Hyacinth MeekerMiller.

## 2014-12-26 NOTE — Telephone Encounter (Signed)
Patient daughter called because she was concerned. [name] weight is up 6 pounds in the last 4 days.  She is not acutely short of breath per the daughter, and is not having PND or orthopnea. There may be some lower extremity edema. She is compliant with medications and may have been drinking a little extra water, but does not think there have been any dietary indiscretions.  Her current dose of Lasix is Lasix 80 mg a.m. alternating with 40 mg and 40 mg p.m. She took 40 mg today.  Plan: We'll increase her Lasix to 80 mg twice a day today and tomorrow.   If her weight is improved by tomorrow morning, she can go back on her usual dose of Lasix, which would be 80 mg a.m. and 40 mg p.m. Tomorrow.  Either way, she will need lab work on Monday and may also need an office visit in the [facility].  Theodore Demarkhonda Barrett, PA-C 12/26/2014 11:47 AM Beeper [phone]

## 2014-12-27 DIAGNOSIS — J9601 Acute respiratory failure with hypoxia: Secondary | ICD-10-CM

## 2014-12-27 LAB — BLOOD GAS, ARTERIAL
Acid-base deficit: 1.9 mmol/L (ref 0.0–2.0)
Bicarbonate: 21.6 mEq/L (ref 20.0–24.0)
DELIVERY SYSTEMS: POSITIVE
DRAWN BY: 362771
Expiratory PAP: 4
FIO2: 1 %
Inspiratory PAP: 12
O2 Saturation: 99.9 %
PH ART: 7.443 (ref 7.350–7.450)
PO2 ART: 395 mmHg — AB (ref 80.0–100.0)
Patient temperature: 98.6
Pressure support: 8 cmH2O
RATE: 10 resp/min
TCO2: 22.6 mmol/L (ref 0–100)
pCO2 arterial: 32.1 mmHg — ABNORMAL LOW (ref 35.0–45.0)

## 2014-12-27 LAB — TROPONIN I
Troponin I: 0.09 ng/mL — ABNORMAL HIGH (ref <0.031)
Troponin I: 0.12 ng/mL — ABNORMAL HIGH (ref <0.031)

## 2014-12-27 LAB — BASIC METABOLIC PANEL
ANION GAP: 10 (ref 5–15)
BUN: 58 mg/dL — ABNORMAL HIGH (ref 6–23)
CO2: 24 mmol/L (ref 19–32)
CREATININE: 1.75 mg/dL — AB (ref 0.50–1.10)
Calcium: 8.8 mg/dL (ref 8.4–10.5)
Chloride: 99 mmol/L (ref 96–112)
GFR calc Af Amer: 29 mL/min — ABNORMAL LOW (ref >90)
GFR calc non Af Amer: 25 mL/min — ABNORMAL LOW (ref >90)
GLUCOSE: 186 mg/dL — AB (ref 70–99)
Potassium: 4.9 mmol/L (ref 3.5–5.1)
SODIUM: 133 mmol/L — AB (ref 135–145)

## 2014-12-27 LAB — GLUCOSE, CAPILLARY
GLUCOSE-CAPILLARY: 135 mg/dL — AB (ref 70–99)
Glucose-Capillary: 192 mg/dL — ABNORMAL HIGH (ref 70–99)
Glucose-Capillary: 206 mg/dL — ABNORMAL HIGH (ref 70–99)

## 2014-12-27 LAB — COMPREHENSIVE METABOLIC PANEL
ALK PHOS: 96 U/L (ref 39–117)
ALT: 21 U/L (ref 0–35)
AST: 33 U/L (ref 0–37)
Albumin: 2.7 g/dL — ABNORMAL LOW (ref 3.5–5.2)
Anion gap: 12 (ref 5–15)
BILIRUBIN TOTAL: 0.7 mg/dL (ref 0.3–1.2)
BUN: 53 mg/dL — AB (ref 6–23)
CO2: 25 mmol/L (ref 19–32)
Calcium: 8.4 mg/dL (ref 8.4–10.5)
Chloride: 100 mmol/L (ref 96–112)
Creatinine, Ser: 1.61 mg/dL — ABNORMAL HIGH (ref 0.50–1.10)
GFR calc Af Amer: 32 mL/min — ABNORMAL LOW (ref >90)
GFR calc non Af Amer: 27 mL/min — ABNORMAL LOW (ref >90)
GLUCOSE: 205 mg/dL — AB (ref 70–99)
Potassium: 3.9 mmol/L (ref 3.5–5.1)
SODIUM: 137 mmol/L (ref 135–145)
Total Protein: 5.9 g/dL — ABNORMAL LOW (ref 6.0–8.3)

## 2014-12-27 LAB — CBC
HCT: 33 % — ABNORMAL LOW (ref 36.0–46.0)
Hemoglobin: 10.7 g/dL — ABNORMAL LOW (ref 12.0–15.0)
MCH: 31.7 pg (ref 26.0–34.0)
MCHC: 32.4 g/dL (ref 30.0–36.0)
MCV: 97.6 fL (ref 78.0–100.0)
PLATELETS: 171 10*3/uL (ref 150–400)
RBC: 3.38 MIL/uL — ABNORMAL LOW (ref 3.87–5.11)
RDW: 17.2 % — ABNORMAL HIGH (ref 11.5–15.5)
WBC: 16.7 10*3/uL — ABNORMAL HIGH (ref 4.0–10.5)

## 2014-12-27 LAB — BRAIN NATRIURETIC PEPTIDE: B Natriuretic Peptide: 1339 pg/mL — ABNORMAL HIGH (ref 0.0–100.0)

## 2014-12-27 MED ORDER — SILVER SULFADIAZINE 1 % EX CREA
TOPICAL_CREAM | Freq: Every day | CUTANEOUS | Status: DC
  Administered 2014-12-27: 1 via TOPICAL
  Administered 2014-12-28: 14:00:00 via TOPICAL
  Administered 2014-12-29 – 2014-12-30 (×2): 1 via TOPICAL
  Administered 2014-12-31 – 2015-01-04 (×5): via TOPICAL
  Filled 2014-12-27: qty 85

## 2014-12-27 MED ORDER — FUROSEMIDE 10 MG/ML IJ SOLN
40.0000 mg | Freq: Every day | INTRAMUSCULAR | Status: DC
  Filled 2014-12-27: qty 4

## 2014-12-27 NOTE — Progress Notes (Signed)
pts came from ed on 100% BiPAP, unable to get oxygen saturations via ear, finger, toes or forehead. Pt.s toes and fingers are purplish blue  As well as her nose and earlobe pt denies shortness of breath, paged Tom callahan who ordered ABG @ 0200 to assess oxygen saturations, will continue to monitor.

## 2014-12-27 NOTE — Consult Note (Signed)
WOC wound consult note Reason for Consult: Two chronic wounds on right foot.  Patietn with PAD.  Followed by podiatrist, [facility]. Next appointment on Monday, 01/04/15 Wound type:Neuropathic vs arterial Pressure Ulcer POA: No Measurement: Distal tip of toe (hammertoe):  0.2cm round x 0.2cm deep with pale pink base.  No exudate. Lateral right great toe: 1cm x 0.4cm x 0.2cm, pale pink base.  Pale pink wound bed, no exudate, no odor.  Healed area on right 5th digit, lateral aspect. Wound bed:As described above. Drainage (amount, consistency, odor) As described above. Periwound:Intact, dry. Dressing procedure/placement/frequency: Patient's daughter is historian and reports that the current treatment is to alternate between silver sulfadiazine and Iodosorb gel, which is a cadexomer iodine product.  Unfortunately, the cadexomer iodine product is non-contract and subsequently not available.  I see no deleterious effects to using silvadene cream daily  Until she is discharged and wound care resumes at home via the podiatrists orders.  She will return to the care of her podiatrist, Dr. Donzetta KohutEdgerton on Monday, 01/04/15. A Prevalon boot is provided for pressure redistribution while here in hospital; patient's daughter in agreement with this as a pressure redistribution method.  She has been advised against elevating her foot with a pillow beneath her mother's posterior calf as it may reduce her mother's already reduced arterial blood flow to her foot. WOC nursing team will not follow, but will remain available to this patient, the nursing and medical team.  Please re-consult if needed. Thanks, Ladona MowLaurie Rayanna Matusik, MSN, RN, GNP, [place], CWON-AP [phone])

## 2014-12-27 NOTE — Progress Notes (Signed)
[facility] PROGRESS NOTE  [name] UJW:[id number] DOB: 1924/10/18 DOA: 12/26/2014 PCP: Ginette OttoSTONEKING,HAL THOMAS, MD  Assessment/Plan: 81 y/o female with PMH of HTN, DM, CKD, OSA/CPAP, Diastolic CHF, mild Aortic Stenosis, A fib (on Eliquis) presented with generalized weakness, fatigue, chills, with sujective fever; patient also reported weight gain, DOE.  -patient is admitted with UTI, CHF with mild pulmonary edema, hyperkalemia   1. Acute on chronic CHF, diastolic HF; ? CHF worse due to AS/MS with PAF; initially CXR showed mild edema, which improved after diuresis/BiPAP; patient clinically looks better, not significant overload  -cont diuresis with lasix/changed 40 QD due to AKI; I/O "-", clinically improving; pend echo; cont BB, not on ACE due to AKI; monitor renal function   2. Mild acute hypoxic respiratory failure on admission likely due to CHF; hypoxia resoled on diuresis/BiPAP; cont as above  3. UTI, started on IV atx, pend cultures 4. Hyperkalemia likely due to PO potassium supplementation+AKI; Hyperkalemia resolved 5. PAF on Eliquis; cont BB; pend echo to re-eval AS/MS  Code Status: DNR Family Communication: d/w patient (indicate person spoken with, relationship, and if by phone, the number) Disposition Plan: home pend clinical improvement    Consultants:  Cardiology   Procedures:  Echo   Antibiotics:  Ceftriaxone 2/27>>>   (indicate start date, and stop date if known)  HPI/Subjective: alert  Objective: Filed Vitals:   12/27/14 0339  BP: 115/73  Pulse: 102  Temp:   Resp: 17    Intake/Output Summary (Last 24 hours) at 12/27/14 0812 Last data filed at 12/27/14 [id number]  Gross per 24 hour  Intake      0 ml  Output    825 ml  Net   -825 ml   Filed Weights   12/26/14 2321 12/27/14 0331  Weight: 71.3 kg (157 lb 3 oz) 71.4 kg (157 lb 6.5 oz)    Exam:   General:  Alert, oriented   Cardiovascular: s1,s2 irregular   Respiratory: LL crackles    Abdomen: soft, nt,nd   Musculoskeletal: no mild leg edema    Data Reviewed: Basic Metabolic Panel:  Recent Labs Lab 12/26/14 1800 12/27/14 0109  NA 135 133*  K 6.2* 4.9  CL 96 99  CO2 25 24  GLUCOSE 165* 186*  BUN 60* 58*  CREATININE 1.87* 1.75*  CALCIUM 10.0 8.8   Liver Function Tests:  Recent Labs Lab 12/26/14 1800  AST 35  ALT 22  ALKPHOS 110  BILITOT 0.9  PROT 7.2  ALBUMIN 3.3*   No results for input(s): LIPASE, AMYLASE in the last 168 hours. No results for input(s): AMMONIA in the last 168 hours. CBC:  Recent Labs Lab 12/26/14 1800  WBC 11.3*  NEUTROABS 8.6*  HGB 12.7  HCT 38.0  MCV 97.9  PLT 202   Cardiac Enzymes:  Recent Labs Lab 12/26/14 1800 12/27/14 0109  TROPONINI 0.09* 0.09*   BNP (last 3 results)  Recent Labs  12/26/14 1800 12/27/14 0344  BNP 1404.5* 1339.0*    ProBNP (last 3 results)  Recent Labs  02/03/14 2158 02/12/14 1421 07/22/14 1213  PROBNP 2950.0* 1542.0* 1209.0*    CBG: No results for input(s): GLUCAP in the last 168 hours.  No results found for this or any previous visit (from the past 240 hour(s)).   Studies: Dg Chest Port 1 View  12/26/2014   CLINICAL DATA:  Weakness and respiratory distress.  EXAM: PORTABLE CHEST - 1 VIEW  COMPARISON:  5 hours prior.  FINDINGS: Stable cardiomegaly and mitral  annulus calcifications. Slight decrease in vascular congestion. Minimal left basilar subsegmental atelectasis. No consolidation to suggest pneumonia. No large pleural effusion or pneumothorax.  IMPRESSION: Stable cardiomegaly.  Decreased vascular congestion.   Electronically Signed   By: Rubye Oaks M.D.   On: 12/26/2014 23:02   Dg Chest Port 1 View  12/26/2014   CLINICAL DATA:  Shortness of breath  EXAM: PORTABLE CHEST - 1 VIEW  COMPARISON:  02/10/2014  FINDINGS: There is cardiomegaly. Mild vascular congestion. No overt edema. No confluent opacities or effusions. No acute bony abnormality. Dense mitral valve  annular calcifications again noted.  IMPRESSION: Cardiomegaly, vascular congestion.   Electronically Signed   By: Charlett Nose M.D.   On: 12/26/2014 18:08    Scheduled Meds: . allopurinol  300 mg Oral Daily  . apixaban  2.5 mg Oral BID  . atorvastatin  10 mg Oral q1800  . cadexomer iodine  1 application Topical QODAY  . calcium-vitamin D  1 tablet Oral Daily  . cefTRIAXone (ROCEPHIN)  IV  1 g Intravenous Q24H  . furosemide  40 mg Intravenous Q12H  . gabapentin  300 mg Oral QHS  . insulin aspart  0-9 Units Subcutaneous TID WC  . levothyroxine  112 mcg Oral QAC breakfast  . metoprolol succinate  12.5 mg Oral Daily  . multivitamin with minerals  1 tablet Oral Daily  . [START ON 12/28/2014] silver sulfADIAZINE  1 application Topical QODAY  . sodium chloride  3 mL Intravenous Q12H  . sodium chloride  3 mL Intravenous Q12H   Continuous Infusions:   Active Problems:   Acute kidney injury   UTI (lower urinary tract infection)   Hyperkalemia   Diastolic CHF, acute on chronic   Elevated troponin I level    Time spent: >35 minutes     Esperanza Sheets  [facility] Pager [phone]. If 7PM-7AM, please contact night-coverage at [email or web address], password [facility] 12/27/2014, 8:12 AM  LOS: 1 day

## 2014-12-27 NOTE — Progress Notes (Signed)
Utilization review completed.  

## 2014-12-28 ENCOUNTER — Inpatient Hospital Stay (HOSPITAL_COMMUNITY): Payer: Medicare Other

## 2014-12-28 DIAGNOSIS — R0602 Shortness of breath: Secondary | ICD-10-CM | POA: Insufficient documentation

## 2014-12-28 DIAGNOSIS — I5033 Acute on chronic diastolic (congestive) heart failure: Secondary | ICD-10-CM

## 2014-12-28 DIAGNOSIS — I509 Heart failure, unspecified: Secondary | ICD-10-CM

## 2014-12-28 DIAGNOSIS — I350 Nonrheumatic aortic (valve) stenosis: Secondary | ICD-10-CM

## 2014-12-28 DIAGNOSIS — R7989 Other specified abnormal findings of blood chemistry: Secondary | ICD-10-CM

## 2014-12-28 LAB — BASIC METABOLIC PANEL
ANION GAP: 8 (ref 5–15)
BUN: 50 mg/dL — ABNORMAL HIGH (ref 6–23)
CHLORIDE: 98 mmol/L (ref 96–112)
CO2: 24 mmol/L (ref 19–32)
Calcium: 8.2 mg/dL — ABNORMAL LOW (ref 8.4–10.5)
Creatinine, Ser: 1.31 mg/dL — ABNORMAL HIGH (ref 0.50–1.10)
GFR, EST AFRICAN AMERICAN: 41 mL/min — AB (ref >90)
GFR, EST NON AFRICAN AMERICAN: 35 mL/min — AB (ref >90)
Glucose, Bld: 110 mg/dL — ABNORMAL HIGH (ref 70–99)
Potassium: 3.3 mmol/L — ABNORMAL LOW (ref 3.5–5.1)
SODIUM: 130 mmol/L — AB (ref 135–145)

## 2014-12-28 LAB — GLUCOSE, CAPILLARY
GLUCOSE-CAPILLARY: 183 mg/dL — AB (ref 70–99)
Glucose-Capillary: 112 mg/dL — ABNORMAL HIGH (ref 70–99)
Glucose-Capillary: 287 mg/dL — ABNORMAL HIGH (ref 70–99)
Glucose-Capillary: 162 mg/dL — ABNORMAL HIGH (ref 70–99)

## 2014-12-28 MED ORDER — FUROSEMIDE 40 MG PO TABS
40.0000 mg | ORAL_TABLET | Freq: Two times a day (BID) | ORAL | Status: DC
  Filled 2014-12-28 (×3): qty 1

## 2014-12-28 MED ORDER — ATORVASTATIN CALCIUM 10 MG PO TABS
10.0000 mg | ORAL_TABLET | Freq: Every day | ORAL | Status: DC
  Administered 2014-12-28 – 2015-01-03 (×7): 10 mg via ORAL
  Filled 2014-12-28 (×8): qty 1

## 2014-12-28 MED ORDER — FUROSEMIDE 10 MG/ML IJ SOLN
40.0000 mg | Freq: Two times a day (BID) | INTRAMUSCULAR | Status: DC
  Administered 2014-12-28 – 2014-12-31 (×6): 40 mg via INTRAVENOUS
  Filled 2014-12-28 (×7): qty 4

## 2014-12-28 MED ORDER — POTASSIUM CHLORIDE CRYS ER 20 MEQ PO TBCR
20.0000 meq | EXTENDED_RELEASE_TABLET | Freq: Two times a day (BID) | ORAL | Status: DC
  Administered 2014-12-28 – 2014-12-30 (×6): 20 meq via ORAL
  Filled 2014-12-28 (×8): qty 1

## 2014-12-28 NOTE — Progress Notes (Signed)
**Note De-Identified  Obfuscation** Patient removed from BIPAP and placed on 3L Rea, nasal massage for redness.  Patient tolerated well. RRT to continue to monitor.

## 2014-12-28 NOTE — Progress Notes (Signed)
  Echocardiogram 2D Echocardiogram has been performed.  Leta JunglingCooper, Florabelle Cardin M 12/28/2014, 10:21 AM

## 2014-12-28 NOTE — Progress Notes (Signed)
Report called to Sandra on 2 west. Pt alert, oriented at baseline, VSS. Elink and CCMD notified, monitor remained on for transfer. Personal belongings with pt.  

## 2014-12-28 NOTE — Progress Notes (Signed)
SUBJECTIVE:  Complains of fatigue and stuffy nose  OBJECTIVE:   Vitals:   Filed Vitals:   12/28/14 0500 12/28/14 0519 12/28/14 0717 12/28/14 0753  BP:      Pulse:    94  Temp:  97.9 F (36.6 C) 97.8 F (36.6 C)   TempSrc:  Oral Axillary   Resp:    21  Height:      Weight: 157 lb 3 oz (71.3 kg)     SpO2:    97%   I&O's:   Intake/Output Summary (Last 24 hours) at 12/28/14 1052 Last data filed at 12/28/14 1610  Gross per 24 hour  Intake    480 ml  Output   1402 ml  Net   -922 ml   TELEMETRY: Reviewed telemetry pt in chronic atrial fibrillation     PHYSICAL EXAM General: Well developed, well nourished, in no acute distress Lungs:   Crackles at bases Heart:   HRRR S1 S2 Pulses are 2+ & equal. Abdomen: Bowel sounds are positive, abdomen soft and non-tender without masses  Extremities:   Trace LE edema  DP +1 Neuro: Alert and oriented X 3. Psych:  Good affect, responds appropriately   LABS: Basic Metabolic Panel:  Recent Labs  96/04/54 1013 12/28/14 0257  NA 137 130*  K 3.9 3.3*  CL 100 98  CO2 25 24  GLUCOSE 205* 110*  BUN 53* 50*  CREATININE 1.61* 1.31*  CALCIUM 8.4 8.2*   Liver Function Tests:  Recent Labs  12/26/14 1800 12/27/14 1013  AST 35 33  ALT 22 21  ALKPHOS 110 96  BILITOT 0.9 0.7  PROT 7.2 5.9*  ALBUMIN 3.3* 2.7*   No results for input(s): LIPASE, AMYLASE in the last 72 hours. CBC:  Recent Labs  12/26/14 1800 12/27/14 1013  WBC 11.3* 16.7*  NEUTROABS 8.6*  --   HGB 12.7 10.7*  HCT 38.0 33.0*  MCV 97.9 97.6  PLT 202 171   Cardiac Enzymes:  Recent Labs  12/26/14 1800 12/27/14 0109 12/27/14 1013  TROPONINI 0.09* 0.09* 0.12*   BNP: Invalid input(s): POCBNP D-Dimer: No results for input(s): DDIMER in the last 72 hours. Hemoglobin A1C: No results for input(s): HGBA1C in the last 72 hours. Fasting Lipid Panel: No results for input(s): CHOL, HDL, LDLCALC, TRIG, CHOLHDL, LDLDIRECT in the last 72 hours. Thyroid  Function Tests: No results for input(s): TSH, T4TOTAL, T3FREE, THYROIDAB in the last 72 hours.  Invalid input(s): FREET3 Anemia Panel: No results for input(s): VITAMINB12, FOLATE, FERRITIN, TIBC, IRON, RETICCTPCT in the last 72 hours. Coag Panel:   Lab Results  Component Value Date   INR 1.13 10/20/2014   INR 1.30 04/08/2013   INR 1.43 05/29/2012    RADIOLOGY: Dg Chest Port 1 View  12/26/2014   CLINICAL DATA:  Weakness and respiratory distress.  EXAM: PORTABLE CHEST - 1 VIEW  COMPARISON:  5 hours prior.  FINDINGS: Stable cardiomegaly and mitral annulus calcifications. Slight decrease in vascular congestion. Minimal left basilar subsegmental atelectasis. No consolidation to suggest pneumonia. No large pleural effusion or pneumothorax.  IMPRESSION: Stable cardiomegaly.  Decreased vascular congestion.   Electronically Signed   By: Rubye Oaks M.D.   On: 12/26/2014 23:02   Dg Chest Port 1 View  12/26/2014   CLINICAL DATA:  Shortness of breath  EXAM: PORTABLE CHEST - 1 VIEW  COMPARISON:  02/10/2014  FINDINGS: There is cardiomegaly. Mild vascular congestion. No overt edema. No confluent opacities or effusions. No acute bony abnormality. Dense  mitral valve annular calcifications again noted.  IMPRESSION: Cardiomegaly, vascular congestion.   Electronically Signed   By: Charlett NoseKevin  Dover M.D.   On: 12/26/2014 18:08   Mm Digital Diagnostic Unilat L  12/11/2014   CLINICAL DATA:  Six month followup evaluation of a benign concordant left breast ultrasound-guided core biopsy. Patient is currently wheelchair-bound and unable to stand for the examination.  EXAM: DIGITAL DIAGNOSTIC left breast MAMMOGRAM WITH CAD  COMPARISON:  06/05/2014, 05/22/2014, 05/16/2013.  ACR Breast Density Category b: There are scattered areas of fibroglandular density.  FINDINGS: The small focal density located medially within the left breast has decreased and is compatible with evolving fat necrosis. There are no findings worrisome for  developing malignancy within the left breast. Limited evaluation in the left MLO projection due to the patient's physical status.  Mammographic images were processed with CAD.  IMPRESSION: No findings worrisome for developing malignancy. If felt to be clinically indicated given the patient's medical status, recommend bilateral screening mammography in July, 2016.  RECOMMENDATION: Screening mammography in July, 2016 if felt to be clinically indicated due to the patient's age and medical status.  I have discussed the findings and recommendations with the patient. Results were also provided in writing at the conclusion of the visit. If applicable, a reminder letter will be sent to the patient regarding the next appointment.  BI-RADS CATEGORY  2: Benign.   Electronically Signed   By: Rolla Plateandolph  Jackson M.D.   On: 12/11/2014 10:56      ASSESSMENT/PLAN:  1.  Acute on chronic diastolic CHF - had 6lb weight gain over 4 days.  Up 12 pounds from admission (?accuracy of weights).  No CP.  BNP elevated at 1339 and vascular congestion on chest xray.  Repeat echo pending.  Change Lasix to 40mg  IV BID and follow renal function closely.  Recheck chest xray 2.  Chronic atrial fibrillation borderline rate controlled.  Cannot increase BB further due to soft BP.  Continue renally dosed Apixaban/BB 3.  Mild AS - repeat 2D echo pending 4.  Mild MS 5.  OSA on CPAP 6.  Severe PVD 7.  Type II DM - per IM 8.  HTN - controlled on BB 9.  Hypokalemia - replete 10.  Elevated Troponin - minimally elevated with flat trend most likely secondary to demand ischemia with acute CHF.   11.  Dementia 12.  Hyponatremia secondary to CHF 13.  UTI on antibiotics   Quintella ReichertURNER,TRACI R, MD  12/28/2014  10:52 AM

## 2014-12-28 NOTE — Progress Notes (Signed)
RT set up CPAP of 6 cmH2O with nasal mask. Pt tolerating well at this time. RT will continue to monitor.

## 2014-12-28 NOTE — Progress Notes (Signed)
[facility] PROGRESS NOTE  [name]:[id number]:[id number] DOB: 10/07/1924 DOA: 12/26/2014 PCP: Ginette OttoSTONEKING,HAL THOMAS, MD  Assessment/Plan: 81 y/o female with PMH of HTN, DM, CKD, OSA/CPAP, Diastolic CHF, mild Aortic Stenosis, A fib (on Eliquis) presented with generalized weakness, fatigue, chills, with sujective fever; patient also reported weight gain, DOE.  -patient is admitted with UTI, CHF with mild pulmonary edema, hyperkalemia   1. Acute on chronic CHF, diastolic HF; ? CHF worse due to AS/MS with PAF; initially CXR showed mild edema, which improved after diuresis/BiPAP; patient clinically looks better, not significant overload  -cont diuresis with lasix/changed to PO;  I/O "-", clinically improving; pend echo; cont BB, not on ACE due to AKI; monitor renal function   2. Mild acute hypoxic respiratory failure on admission likely due to CHF; hypoxia resoling on diuresis/BiPAP at night for OSA; cont as above  3. UTI, started on IV atx, pend cultures 4. Initial Hyperkalemia on admission; but currently hypokalemia with diuresis; will start potassium supplements; monitor lytes with hyponatremia   5. PAF on Eliquis; cont BB; pend echo to re-eval AS/MS 6. Two chronic wounds on right foot. Patietn with PAD. Follow[facility]. Next appointment on Monday, 01/04/15; appreciate wound care evaluation    Tf to tele on 2/29  D/w patient, Called updated her [name]  [name],[name] [phone]     Code Status: DNR Family Communication: d/w patient (indicate person spoken with, relationship, and if by phone, the number) Disposition Plan: home pend clinical improvement; obtain PT    Consultants:  Cardiology   Procedures:  Echo   Antibiotics:  Ceftriaxone 2/27>>>   (indicate start date, and stop date if known)  HPI/Subjective: alert  Objective: Filed Vitals:   12/28/14 0753  BP:   Pulse: 94  Temp:    Resp: 21    Intake/Output Summary (Last 24 hours) at 12/28/14 0848 Last data filed at 12/28/14 0718  Gross per 24 hour  Intake    480 ml  Output   1402 ml  Net   -922 ml   Filed Weights   12/26/14 2321 12/27/14 0331 12/28/14 0500  Weight: 71.3 kg (157 lb 3 oz) 71.4 kg (157 lb 6.5 oz) 71.3 kg (157 lb 3 oz)    Exam:   General:  Alert, oriented   Cardiovascular: s1,s2 irregular   Respiratory: LL crackles   Abdomen: soft, nt,nd   Musculoskeletal: no mild leg edema    Data Reviewed: Basic Metabolic Panel:  Recent Labs Lab 12/26/14 1800 12/27/14 0109 12/27/14 1013 12/28/14 0257  NA 135 133* 137 130*  K 6.2* 4.9 3.9 3.3*  CL 96 99 100 98  CO2 25 24 25 24   GLUCOSE 165* 186* 205* 110*  BUN 60* 58* 53* 50*  CREATININE 1.87* 1.75* 1.61* 1.31*  CALCIUM 10.0 8.8 8.4 8.2*   Liver Function Tests:  Recent Labs Lab 12/26/14 1800 12/27/14 1013  AST 35 33  ALT 22 21  ALKPHOS 110 96  BILITOT 0.9 0.7  PROT 7.2 5.9*  ALBUMIN 3.3* 2.7*   No results for input(s): LIPASE, AMYLASE in the last 168 hours. No results for input(s): AMMONIA in the last 168 hours. CBC:  Recent Labs Lab 12/26/14 1800 12/27/14 1013  WBC 11.3* 16.7*  NEUTROABS 8.6*  --   HGB 12.7 10.7*  HCT 38.0 33.0*  MCV 97.9 97.6  PLT 202 171   Cardiac Enzymes:  Recent Labs Lab 12/26/14 1800 12/27/14 0109 12/27/14 1013  TROPONINI 0.09* 0.09* 0.12*   BNP (last 3 results)  Recent Labs  12/26/14 1800 12/27/14 0344  BNP 1404.5* 1339.0*    ProBNP (last 3 results)  Recent Labs  02/03/14 2158 02/12/14 1421 07/22/14 1213  PROBNP 2950.0* 1542.0* 1209.0*    CBG:  Recent Labs Lab 12/27/14 0823 12/27/14 1134 12/27/14 2131  GLUCAP 135* 192* 206*    No results found for this or any previous visit (from the past 240 hour(s)).   Studies: Dg Chest Port 1 View  12/26/2014   CLINICAL DATA:  Weakness and respiratory distress.  EXAM: PORTABLE CHEST - 1 VIEW  COMPARISON:  5 hours  prior.  FINDINGS: Stable cardiomegaly and mitral annulus calcifications. Slight decrease in vascular congestion. Minimal left basilar subsegmental atelectasis. No consolidation to suggest pneumonia. No large pleural effusion or pneumothorax.  IMPRESSION: Stable cardiomegaly.  Decreased vascular congestion.   Electronically Signed   By: Rubye Oaks M.D.   On: 12/26/2014 23:02   Dg Chest Port 1 View  12/26/2014   CLINICAL DATA:  Shortness of breath  EXAM: PORTABLE CHEST - 1 VIEW  COMPARISON:  02/10/2014  FINDINGS: There is cardiomegaly. Mild vascular congestion. No overt edema. No confluent opacities or effusions. No acute bony abnormality. Dense mitral valve annular calcifications again noted.  IMPRESSION: Cardiomegaly, vascular congestion.   Electronically Signed   By: Charlett Nose M.D.   On: 12/26/2014 18:08    Scheduled Meds: . allopurinol  300 mg Oral Daily  . apixaban  2.5 mg Oral BID  . atorvastatin  10 mg Oral q1800  . cadexomer iodine  1 application Topical QODAY  . calcium-vitamin D  1 tablet Oral Daily  . cefTRIAXone (ROCEPHIN)  IV  1 g Intravenous Q24H  . furosemide  40 mg Intravenous Daily  . gabapentin  300 mg Oral QHS  . insulin aspart  0-9 Units Subcutaneous TID WC  . levothyroxine  112 mcg Oral QAC breakfast  . metoprolol succinate  12.5 mg Oral Daily  . multivitamin with minerals  1 tablet Oral Daily  . silver sulfADIAZINE   Topical Daily  . sodium chloride  3 mL Intravenous Q12H  . sodium chloride  3 mL Intravenous Q12H   Continuous Infusions:   Active Problems:   Acute kidney injury   UTI (lower urinary tract infection)   Hyperkalemia   Diastolic CHF, acute on chronic   Elevated troponin I level    Time spent: >35 minutes     Esperanza Sheets  [facility] Pager [phone]. If 7PM-7AM, please contact night-coverage at [email or web address], password [facility] 12/28/2014, 8:48 AM  LOS: 2 days

## 2014-12-29 LAB — BASIC METABOLIC PANEL
Anion gap: 12 (ref 5–15)
BUN: 48 mg/dL — ABNORMAL HIGH (ref 6–23)
CO2: 26 mmol/L (ref 19–32)
Calcium: 8.5 mg/dL (ref 8.4–10.5)
Chloride: 97 mmol/L (ref 96–112)
Creatinine, Ser: 1.36 mg/dL — ABNORMAL HIGH (ref 0.50–1.10)
GFR calc Af Amer: 39 mL/min — ABNORMAL LOW (ref >90)
GFR, EST NON AFRICAN AMERICAN: 34 mL/min — AB (ref >90)
Glucose, Bld: 150 mg/dL — ABNORMAL HIGH (ref 70–99)
Potassium: 4.7 mmol/L (ref 3.5–5.1)
SODIUM: 135 mmol/L (ref 135–145)

## 2014-12-29 LAB — URINE CULTURE

## 2014-12-29 LAB — CBC
HEMATOCRIT: 33.9 % — AB (ref 36.0–46.0)
HEMOGLOBIN: 10.8 g/dL — AB (ref 12.0–15.0)
MCH: 32.1 pg (ref 26.0–34.0)
MCHC: 31.9 g/dL (ref 30.0–36.0)
MCV: 100.9 fL — ABNORMAL HIGH (ref 78.0–100.0)
Platelets: 158 10*3/uL (ref 150–400)
RBC: 3.36 MIL/uL — ABNORMAL LOW (ref 3.87–5.11)
RDW: 17.5 % — ABNORMAL HIGH (ref 11.5–15.5)
WBC: 12.3 10*3/uL — AB (ref 4.0–10.5)

## 2014-12-29 LAB — GLUCOSE, CAPILLARY
GLUCOSE-CAPILLARY: 139 mg/dL — AB (ref 70–99)
GLUCOSE-CAPILLARY: 122 mg/dL — AB (ref 70–99)
GLUCOSE-CAPILLARY: 161 mg/dL — AB (ref 70–99)
Glucose-Capillary: 176 mg/dL — ABNORMAL HIGH (ref 70–99)
Glucose-Capillary: 128 mg/dL — ABNORMAL HIGH (ref 70–99)

## 2014-12-29 MED ORDER — AMIODARONE HCL 200 MG PO TABS
200.0000 mg | ORAL_TABLET | Freq: Two times a day (BID) | ORAL | Status: DC
Start: 2014-12-29 — End: 2015-01-04
  Administered 2014-12-29 – 2015-01-04 (×13): 200 mg via ORAL
  Filled 2014-12-29 (×14): qty 1

## 2014-12-29 NOTE — Progress Notes (Addendum)
Medicare Important Message given? YES (If response is "NO", the following Medicare IM given date fields will be blank) Date Medicare IM given:12/29/2014 Medicare IM given by: Benedetto Ryder 

## 2014-12-29 NOTE — Progress Notes (Signed)
Placed patient on CPAP 6cmH20. Patient is tolerating well at this time.

## 2014-12-29 NOTE — Progress Notes (Signed)
[facility] PROGRESS NOTE  [name] ZOX:[id number] DOB: 01/30/1924 DOA: 12/26/2014 PCP: Ginette Otto, MD  Assessment/Plan: 81 y/o female with PMH of HTN, DM, CKD, OSA/CPAP, Diastolic CHF, mild Aortic Stenosis, A fib (on Eliquis) presented with generalized weakness, fatigue, chills, with sujective fever; patient also reported weight gain, DOE.  -patient is admitted with UTI, CHF with mild pulmonary edema, hyperkalemia, AKI   1. Acute on chronic CHF, diastolic HF, pulmonary HTN; probable CHF worse due to AS/MS with PAF; initially CXR showed mild edema, which has improved after diuresis/BiPAP; patient is on diuresis with lasix IV 40 BID;  I/O "-", clinically improving on IV diuresis, appreciate cardiology input; will cont BB, not on ACE due to AKI; monitor renal function   2. Mild acute hypoxic respiratory failure on admission likely due to CHF; hypoxia resolving on diuresis/BiPAP at night for OSA; cont as above  3. UTI started on IV atx, prelim urine cultures: GNR 4. Initial Hyperkalemia on admission; but currently hypokalemia with diuresis; started potassium supplements; monitor lytes with hyponatremia   5. PAF on Eliquis; cont BB; added amio per cards; AS/MS: caper cardiology, patient/family discussions: patient is not interested in surgical intervention; management per cardiology  6. Two chronic wounds on right foot. Patietn with PAD. Followed by podiatrist, Dr. Donzetta Kohut at [facility]. Next appointment on Monday, 01/04/15; appreciate wound care evaluation  7. DM diet controlled; cont ISS while inpatient    D/w patient, recently called updated her daughter  Marcelino Duster [phone]     Code Status: DNR Family Communication: d/w patient (indicate person spoken with, relationship, and if by phone, the number) Disposition Plan: home pend clinical improvement;  Echo Impressions:  - The patient was in atrial fibrillation.  Normal LV size with mild LV hypertrophy. Moderately dilated RV with mildly decreased systolic function. The aortic valve was severely calcified. Mean gradient was only 15 mmHg suggesting mild AS but calculated valve area 0.73 cm^2, suggesting severe. Visually at least moderate aortic stenosis. This may need further characterization by TEE. The mitral valve was heavily calcified. There appeared to be moderate mitral stenosis with mean gradient 8 mmHg. There was at least moderate MR but shadowing from calcified valve makes this difficult to fully evaluate. Severe TR. Moderate pulmonary hypertension. Dilated IVC. Overall, TEE may be helpful to assess the valves more closely.  Consultants:  Cardiology   Procedures:  Echo   Antibiotics:  Ceftriaxone 2/27>>>   (indicate start date, and stop date if known)  HPI/Subjective: alert  Objective: Filed Vitals:   12/29/14 1118  BP: 101/69  Pulse: 109  Temp:   Resp: 20    Intake/Output Summary (Last 24 hours) at 12/29/14 1129 Last data filed at 12/29/14 0905  Gross per 24 hour  Intake      0 ml  Output      0 ml  Net      0 ml   Filed Weights   12/27/14 0331 12/28/14 0500 12/29/14 0443  Weight: 71.4 kg (157 lb 6.5 oz) 71.3 kg (157 lb 3 oz) 72.8 kg (160 lb 7.9 oz)    Exam:   General:  Alert, oriented   Cardiovascular: s1,s2 irregular   Respiratory: LL crackles   Abdomen: soft, nt,nd   Musculoskeletal: no mild leg edema    Data Reviewed: Basic Metabolic Panel:  Recent Labs Lab 12/26/14 1800 12/27/14 0109 12/27/14 1013 12/28/14 0257 12/29/14 0436  NA 135 133* 137 130* 135  K 6.2* 4.9 3.9  3.3* 4.7  CL 96 99 100 98 97  CO2 25 24 25 24 26   GLUCOSE 165* 186* 205* 110* 150*  BUN 60* 58* 53* 50* 48*  CREATININE 1.87* 1.75* 1.61* 1.31* 1.36*  CALCIUM 10.0 8.8 8.4 8.2* 8.5   Liver Function Tests:  Recent Labs Lab 12/26/14 1800 12/27/14 1013  AST 35 33  ALT 22 21  ALKPHOS 110 96   BILITOT 0.9 0.7  PROT 7.2 5.9*  ALBUMIN 3.3* 2.7*   No results for input(s): LIPASE, AMYLASE in the last 168 hours. No results for input(s): AMMONIA in the last 168 hours. CBC:  Recent Labs Lab 12/26/14 1800 12/27/14 1013 12/29/14 0436  WBC 11.3* 16.7* 12.3*  NEUTROABS 8.6*  --   --   HGB 12.7 10.7* 10.8*  HCT 38.0 33.0* 33.9*  MCV 97.9 97.6 100.9*  PLT 202 171 158   Cardiac Enzymes:  Recent Labs Lab 12/26/14 1800 12/27/14 0109 12/27/14 1013  TROPONINI 0.09* 0.09* 0.12*   BNP (last 3 results)  Recent Labs  12/26/14 1800 12/27/14 0344  BNP 1404.5* 1339.0*    ProBNP (last 3 results)  Recent Labs  02/03/14 2158 02/12/14 1421 07/22/14 1213  PROBNP 2950.0* 1542.0* 1209.0*    CBG:  Recent Labs Lab 12/28/14 0719 12/28/14 1242 12/28/14 1641 12/28/14 2113 12/29/14 0607  GLUCAP 112* 287* 183* 162* 139*    Recent Results (from the past 240 hour(s))  Urine culture     Status: None (Preliminary result)   Collection Time: 12/26/14  7:14 PM  Result Value Ref Range Status   Specimen Description URINE, CLEAN CATCH  Final   Special Requests ADDED [id number] 12/27/14  Final   Colony Count   Final    >=100,000 COLONIES/ML Performed at [facility]    Culture   Final    GRAM NEGATIVE RODS Performed at [facility]    Report Status PENDING  Incomplete     Studies: Dg Chest 2 View  12/28/2014   CLINICAL DATA:  Atrial fibrillation, peripheral arterial disease, hypertension, CHF  EXAM: CHEST  2 VIEW  COMPARISON:  12/26/2014  FINDINGS: Mild bilateral interstitial thickening. No pleural effusion, focal consolidation or pneumothorax. Stable cardiomegaly. Thoracic aortic atherosclerosis. Prominence of the central pulmonary vasculature.  No acute osseous abnormality.  IMPRESSION: Cardiomegaly with mild interstitial edema.   Electronically Signed   By: Elige KoHetal  Patel   On: 12/28/2014 12:34    Scheduled Meds: . allopurinol  300 mg Oral Daily  . amiodarone   200 mg Oral BID  . apixaban  2.5 mg Oral BID  . atorvastatin  10 mg Oral QHS  . cadexomer iodine  1 application Topical QODAY  . calcium-vitamin D  1 tablet Oral Daily  . cefTRIAXone (ROCEPHIN)  IV  1 g Intravenous Q24H  . furosemide  40 mg Intravenous BID  . gabapentin  300 mg Oral QHS  . insulin aspart  0-9 Units Subcutaneous TID WC  . levothyroxine  112 mcg Oral QAC breakfast  . metoprolol succinate  12.5 mg Oral Daily  . multivitamin with minerals  1 tablet Oral Daily  . potassium chloride  20 mEq Oral BID  . silver sulfADIAZINE   Topical Daily  . sodium chloride  3 mL Intravenous Q12H  . sodium chloride  3 mL Intravenous Q12H   Continuous Infusions:   Active Problems:   Acute kidney injury   UTI (lower urinary tract infection)   Hyperkalemia   Diastolic CHF, acute on chronic  Elevated troponin I level   SOB (shortness of breath)    Time spent: >35 minutes     Esperanza Sheets  [facility] Pager [phone]. If 7PM-7AM, please contact night-coverage at [email or web address], password [facility] 12/29/2014, 11:29 AM  LOS: 3 days

## 2014-12-29 NOTE — Evaluation (Signed)
Physical Therapy Evaluation Patient Details Name: [name] MRN: [id number] DOB: April 25, 1924 Today's Date: 12/29/2014   History of Present Illness  [name] is a 81 y.o. woman with multiple comorbidities and an extensive cardiac history including chronic diastolic HF, mild aortic and mitral valve stenosis (last echo 12/2013), and atrial fibrillation (chronic anticoagulation with Eliquis) who was in her baseline state of health until 2/26. Pt with acute kidney injury, UTI, weakness and demand ischemia   Clinical Impression  Pt with posterior lean, decreased strength and mobility as well as all below deficits who will benefit from acute therapy to maximize mobility, function, balance and activity to decrease burden of care and fall risk. Pt [facility] as pt incontinent of urine on arrival and BM during session. Will continue to follow.     Follow Up Recommendations Home health PT;Supervision/Assistance - 24 hour    Equipment Recommendations  None recommended by PT    Recommendations for Other Services       Precautions / Restrictions Precautions Precautions: Fall      Mobility  Bed Mobility Overal bed mobility: Needs Assistance Bed Mobility: Supine to Sit     Supine to sit: Mod assist     General bed mobility comments: pt able to pivot legs to EOB with mod assist to elevate trunk from surface  Transfers Overall transfer level: Needs assistance   Transfers: Sit to/from Stand Sit to Stand: Mod assist         General transfer comment: attempted squat pivot bed to bSC but pt with excessive posterior lean without RW present and could not safely pivot after 3 attempts. Pt with RW present able to stand from bed, BSC and chair x 4 with cues for hand placement, anterior translation and safety with assist for weight shift and elevation  Ambulation/Gait Ambulation/Gait assistance: Min assist Ambulation Distance  (Feet): 5 Feet Assistive device: Rolling walker (2 wheeled) Gait Pattern/deviations: Shuffle;Trunk flexed   Gait velocity interpretation: Below normal speed for age/gender General Gait Details: cues for posture and position in RW  Stairs            Wheelchair Mobility    Modified Rankin (Stroke Patients Only)       Balance Overall balance assessment: Needs assistance   Sitting balance-Leahy Scale: Fair       Standing balance-Leahy Scale: Poor                               Pertinent Vitals/Pain Pain Assessment: 0-10 Pain Score: 4  Pain Location: chronic back pain Pain Descriptors / Indicators: Aching Pain Intervention(s): Limited activity within patient's tolerance;Repositioned  HR 109 sats 96% on RA despite wheezing    Home Living Family/patient expects to be discharged to:: Private residence Living Arrangements: Non-relatives/Friends Available Help at Discharge: Personal care attendant;Available 24 hours/day Type of Home: House Home Access: Ramped entrance     Home Layout: One level Home Equipment: Walker - 2 wheels;Walker - 4 wheels;Grab bars - toilet;Grab bars - tub/shower;Bedside commode;Shower seat;Wheelchair - manual      Prior Function Level of Independence: Needs assistance   Gait / Transfers Assistance Needed: assist for standing, transfers and all ADLS, pt uses RW in house and WC outside           Hand Dominance        Extremity/Trunk Assessment   Upper Extremity Assessment: Generalized  weakness           Lower Extremity Assessment: Generalized weakness      Cervical / Trunk Assessment: Kyphotic  Communication   Communication: No difficulties  Cognition Arousal/Alertness: Awake/alert Behavior During Therapy: WFL for tasks assessed/performed Overall Cognitive Status: Impaired/Different from baseline Area of Impairment: Problem solving;Safety/judgement         Safety/Judgement: Decreased awareness of  safety;Decreased awareness of deficits   Problem Solving: Slow processing General Comments: pt with fear of falling and maintained posterior lean despite cues and assist    General Comments      Exercises        Assessment/Plan    PT Assessment Patient needs continued PT services  PT Diagnosis Difficulty walking;Generalized weakness   PT Problem List Decreased strength;Decreased cognition;Decreased activity tolerance;Decreased balance;Decreased mobility;Decreased knowledge of use of DME;Decreased safety awareness;Decreased skin integrity;Obesity  PT Treatment Interventions Gait training;Functional mobility training;DME instruction;Therapeutic activities;Therapeutic exercise;Balance training;Patient/family education   PT Goals (Current goals can be found in the Care Plan section) Acute Rehab PT Goals Patient Stated Goal: return home able to walk PT Goal Formulation: With patient Time For Goal Achievement: 01/12/15 Potential to Achieve Goals: Fair    Frequency Min 3X/week   Barriers to discharge        Co-evaluation               End of Session Equipment Utilized During Treatment: Gait belt Activity Tolerance: Patient limited by fatigue Patient left: in chair;with call bell/phone within reach;with chair alarm set;with nursing/sitter in room Nurse Communication: Mobility status;Precautions         Time: 1610-9604 PT Time Calculation (min) (ACUTE ONLY): 30 min   Charges:   PT Evaluation $Initial PT Evaluation Tier I: 1 Procedure PT Treatments $Therapeutic Activity: 8-22 mins   PT G CodesDelorse Lek 12/29/2014, 11:20 AM Delaney Meigs, PT [phone]

## 2014-12-29 NOTE — Progress Notes (Signed)
SUBJECTIVE:  Feels better today but tired  OBJECTIVE:   Vitals:   Filed Vitals:   12/28/14 1500 12/28/14 1614 12/28/14 2133 12/29/14 0443  BP:  119/83 105/72 101/68  Pulse:  108 52 109  Temp: 97.9 F (36.6 C) 97.8 F (36.6 C) 97.5 F (36.4 C) 98.4 F (36.9 C)  TempSrc: Oral Oral Oral Oral  Resp:  20 20 21   Height:      Weight:    160 lb 7.9 oz (72.8 kg)  SpO2:   100% 96%   I&O's:   Intake/Output Summary (Last 24 hours) at 12/29/14 16100909 Last data filed at 12/29/14 [id number]  Gross per 24 hour  Intake      0 ml  Output      0 ml  Net      0 ml   TELEMETRY: Reviewed telemetry pt in atrial fibrillation with mildly elevated HR:     PHYSICAL EXAM General: Well developed, well nourished, in no acute distress Head: Eyes PERRLA, No xanthomas.   Normal cephalic and atramatic  Lungs:   Clear bilaterally to auscultation and percussion. Heart:   tachy S1 S2 Pulses are 2+ & equal. Abdomen: Bowel sounds are positive, abdomen soft and non-tender without masses Extremities:   No clubbing, cyanosis or edema.  DP +1 Neuro: Alert and oriented X 3. Psych:  Good affect, responds appropriately   LABS: Basic Metabolic Panel:  Recent Labs  54/06/8101/29/16 0257 12/29/14 0436  NA 130* 135  K 3.3* 4.7  CL 98 97  CO2 24 26  GLUCOSE 110* 150*  BUN 50* 48*  CREATININE 1.31* 1.36*  CALCIUM 8.2* 8.5   Liver Function Tests:  Recent Labs  12/26/14 1800 12/27/14 1013  AST 35 33  ALT 22 21  ALKPHOS 110 96  BILITOT 0.9 0.7  PROT 7.2 5.9*  ALBUMIN 3.3* 2.7*   No results for input(s): LIPASE, AMYLASE in the last 72 hours. CBC:  Recent Labs  12/26/14 1800 12/27/14 1013 12/29/14 0436  WBC 11.3* 16.7* 12.3*  NEUTROABS 8.6*  --   --   HGB 12.7 10.7* 10.8*  HCT 38.0 33.0* 33.9*  MCV 97.9 97.6 100.9*  PLT 202 171 158   Cardiac Enzymes:  Recent Labs  12/26/14 1800 12/27/14 0109 12/27/14 1013  TROPONINI 0.09* 0.09* 0.12*   BNP: Invalid input(s): POCBNP D-Dimer: No results  for input(s): DDIMER in the last 72 hours. Hemoglobin A1C: No results for input(s): HGBA1C in the last 72 hours. Fasting Lipid Panel: No results for input(s): CHOL, HDL, LDLCALC, TRIG, CHOLHDL, LDLDIRECT in the last 72 hours. Thyroid Function Tests: No results for input(s): TSH, T4TOTAL, T3FREE, THYROIDAB in the last 72 hours.  Invalid input(s): FREET3 Anemia Panel: No results for input(s): VITAMINB12, FOLATE, FERRITIN, TIBC, IRON, RETICCTPCT in the last 72 hours. Coag Panel:   Lab Results  Component Value Date   INR 1.13 10/20/2014   INR 1.30 04/08/2013   INR 1.43 05/29/2012    RADIOLOGY: Dg Chest 2 View  12/28/2014   CLINICAL DATA:  Atrial fibrillation, peripheral arterial disease, hypertension, CHF  EXAM: CHEST  2 VIEW  COMPARISON:  12/26/2014  FINDINGS: Mild bilateral interstitial thickening. No pleural effusion, focal consolidation or pneumothorax. Stable cardiomegaly. Thoracic aortic atherosclerosis. Prominence of the central pulmonary vasculature.  No acute osseous abnormality.  IMPRESSION: Cardiomegaly with mild interstitial edema.   Electronically Signed   By: Elige KoHetal  Patel   On: 12/28/2014 12:34   Dg Chest Port 1 View  12/26/2014  CLINICAL DATA:  Weakness and respiratory distress.  EXAM: PORTABLE CHEST - 1 VIEW  COMPARISON:  5 hours prior.  FINDINGS: Stable cardiomegaly and mitral annulus calcifications. Slight decrease in vascular congestion. Minimal left basilar subsegmental atelectasis. No consolidation to suggest pneumonia. No large pleural effusion or pneumothorax.  IMPRESSION: Stable cardiomegaly.  Decreased vascular congestion.   Electronically Signed   By: Rubye Oaks M.D.   On: 12/26/2014 23:02   Dg Chest Port 1 View  12/26/2014   CLINICAL DATA:  Shortness of breath  EXAM: PORTABLE CHEST - 1 VIEW  COMPARISON:  02/10/2014  FINDINGS: There is cardiomegaly. Mild vascular congestion. No overt edema. No confluent opacities or effusions. No acute bony abnormality. Dense  mitral valve annular calcifications again noted.  IMPRESSION: Cardiomegaly, vascular congestion.   Electronically Signed   By: Charlett Nose M.D.   On: 12/26/2014 18:08   Mm Digital Diagnostic Unilat L  12/11/2014   CLINICAL DATA:  Six month followup evaluation of a benign concordant left breast ultrasound-guided core biopsy. Patient is currently wheelchair-bound and unable to stand for the examination.  EXAM: DIGITAL DIAGNOSTIC left breast MAMMOGRAM WITH CAD  COMPARISON:  06/05/2014, 05/22/2014, 05/16/2013.  ACR Breast Density Category b: There are scattered areas of fibroglandular density.  FINDINGS: The small focal density located medially within the left breast has decreased and is compatible with evolving fat necrosis. There are no findings worrisome for developing malignancy within the left breast. Limited evaluation in the left MLO projection due to the patient's physical status.  Mammographic images were processed with CAD.  IMPRESSION: No findings worrisome for developing malignancy. If felt to be clinically indicated given the patient's medical status, recommend bilateral screening mammography in July, 2016.  RECOMMENDATION: Screening mammography in July, 2016 if felt to be clinically indicated due to the patient's age and medical status.  I have discussed the findings and recommendations with the patient. Results were also provided in writing at the conclusion of the visit. If applicable, a reminder letter will be sent to the patient regarding the next appointment.  BI-RADS CATEGORY  2: Benign.   Electronically Signed   By: Rolla Plate M.D.   On: 12/11/2014 10:56    ASSESSMENT/PLAN:  1. Acute on chronic diastolic CHF - had 6lb weight gain over 4 days. Up 12 pounds from admission (?accuracy of weights). No CP. BNP elevated at 1339 and vascular congestion on chest xray. Repeat echo pending. Continue Lasix   IV BID and follow renal function closely. Repeat chest xray with mild  pulmonary congestion.  Lungs are clear today and no LE edema.  She is 1.5L net neg but  weight is up (?accuracy of scales - all other weights were in ICU).  Recheck BNP to see trend.   2. Chronic atrial fibrillation/flutter borderline rate controlled. Cannot increase BB further due to soft BP. Continue renally dosed Apixaban/BB.  Will add Amio  BID for better rate control.  She had been on this in the past but due to controlled rates it was stopped.   3. Mild AS - repeat 2D echo with at least moderate AS - I discussed the findings of the echo with Peytin and the natural history of AS/MS and MR.  This is probably contributing to her SOB and CHF.  She is not interested in any surgical intervention if further testing with TEE would indicate severe AS or MS/MR so will not proceed with further workup and continue to treat medically.   4. Moderate MS/MR  with moderate pulmonary HTN with PASP 5. OSA on CPAP 6. Severe PVD 7. Type II DM - per IM 8. HTN - controlled on BB 9. Hypokalemia - repleted 10. Elevated Troponin - minimally elevated with flat trend most likely secondary to demand ischemia with acute CHF.  11. Dementia 12. Hyponatremia secondary to CHF- resolved 13. UTI on antibiotics   Quintella Reichert, MD  12/29/2014  9:09 AM

## 2014-12-30 DIAGNOSIS — N179 Acute kidney failure, unspecified: Secondary | ICD-10-CM | POA: Insufficient documentation

## 2014-12-30 LAB — GLUCOSE, CAPILLARY
GLUCOSE-CAPILLARY: 140 mg/dL — AB (ref 70–99)
Glucose-Capillary: 109 mg/dL — ABNORMAL HIGH (ref 70–99)
Glucose-Capillary: 186 mg/dL — ABNORMAL HIGH (ref 70–99)
Glucose-Capillary: 129 mg/dL — ABNORMAL HIGH (ref 70–99)

## 2014-12-30 LAB — BASIC METABOLIC PANEL
ANION GAP: 13 (ref 5–15)
BUN: 45 mg/dL — ABNORMAL HIGH (ref 6–23)
CALCIUM: 8.3 mg/dL — AB (ref 8.4–10.5)
CHLORIDE: 98 mmol/L (ref 96–112)
CO2: 22 mmol/L (ref 19–32)
Creatinine, Ser: 1.14 mg/dL — ABNORMAL HIGH (ref 0.50–1.10)
GFR calc Af Amer: 48 mL/min — ABNORMAL LOW (ref >90)
GFR calc non Af Amer: 42 mL/min — ABNORMAL LOW (ref >90)
Glucose, Bld: 110 mg/dL — ABNORMAL HIGH (ref 70–99)
POTASSIUM: 4.2 mmol/L (ref 3.5–5.1)
SODIUM: 133 mmol/L — AB (ref 135–145)

## 2014-12-30 LAB — BRAIN NATRIURETIC PEPTIDE: B NATRIURETIC PEPTIDE 5: 1176.8 pg/mL — AB (ref 0.0–100.0)

## 2014-12-30 MED ORDER — CEPHALEXIN 500 MG PO CAPS
500.0000 mg | ORAL_CAPSULE | Freq: Two times a day (BID) | ORAL | Status: DC
  Administered 2014-12-30 – 2015-01-03 (×9): 500 mg via ORAL
  Filled 2014-12-30 (×10): qty 1

## 2014-12-30 NOTE — Progress Notes (Signed)
Utilization review completed.  

## 2014-12-30 NOTE — Progress Notes (Signed)
Right foot dressing changed. Silver cream, 2X2 wet gauze, and 2X2 dry gauze applied. Foot wrapped in Kerlex. Will cont to monitor.

## 2014-12-30 NOTE — Progress Notes (Signed)
[facility] PROGRESS NOTE  [name] ONG:[id number] DOB: [birth year] DOA: 12/26/2014 PCP: Ginette OttoSTONEKING,HAL THOMAS, MD  Assessment/Plan: 81 y/o female with PMH of HTN, DM, CKD, OSA/CPAP, Diastolic CHF, mild Aortic Stenosis, A fib (on Eliquis) presented with generalized weakness, fatigue, chills, with sujective fever; patient also reported weight gain, DOE.  -patient is admitted with UTI, CHF with mild pulmonary edema, hyperkalemia, AKI   1. Acute on chronic CHF, diastolic HF, pulmonary HTN; probable CHF worse due to AS/MS with PAF; initially CXR showed mild edema, which has improved after diuresis/BiPAP;  patient is on diuresis with lasix IV 40 BID;  clinically improving on IV diuresis.  appreciate cardiology input; will cont BB, not on ACE due to AKI; monitor renal function  Unclear if weight is accurate,   2. Mild acute hypoxic respiratory failure on admission likely due to CHF; hypoxia resolving on diuresis/BiPAP at night for OSA; cont as above   3. UTI started on IV atx, prelim urine cultures: E coli, sensitive to ceftriaxone and cefazolin. Will change antibiotics to keflex. Day 4 of antibiotics.   4. Initial Hyperkalemia on admission; but currently hypokalemia with diuresis; started potassium supplements; monitor lytes with hyponatremia    5. PAF on Eliquis; cont BB; added amio per cards;  AS/MS: per cardiology, patient/family discussions: patient is not interested in surgical intervention;  6. Two chronic wounds on right foot. Patietn with PAD. Followed by podiatrist, Dr. Donzetta KohutEdgerton at [facility]. Next appointment on Monday, 01/04/15; appreciate wound care evaluation. Continue with wound care.   7. DM diet controlled; cont ISS while inpatient    D/w patient, recently called updated her daughter  Marcelino DusterSellers,Teresa Daughter [phone]     Code Status: DNR Family Communication: d/w patient (indicate person spoken with, relationship, and if  by phone, the number) Disposition Plan: home pend clinical improvement;  Echo Impressions:  - The patient was in atrial fibrillation. Normal LV size with mild LV hypertrophy. Moderately dilated RV with mildly decreased systolic function. The aortic valve was severely calcified. Mean gradient was only 15 mmHg suggesting mild AS but calculated valve area 0.73 cm^2, suggesting severe. Visually at least moderate aortic stenosis. This may need further characterization by TEE. The mitral valve was heavily calcified. There appeared to be moderate mitral stenosis with mean gradient 8 mmHg. There was at least moderate MR but shadowing from calcified valve makes this difficult to fully evaluate. Severe TR. Moderate pulmonary hypertension. Dilated IVC. Overall, TEE may be helpful to assess the valves more closely.  Consultants:  Cardiology   Procedures:  Echo   Antibiotics:  Ceftriaxone 2/27>>>   (indicate start date, and stop date if known)  HPI/Subjective: Alert, eating ice.  Breathing better.    Objective: Filed Vitals:   12/30/14 1340  BP: 104/68  Pulse: 101  Temp: 98.7 F (37.1 C)  Resp: 18    Intake/Output Summary (Last 24 hours) at 12/30/14 1444 Last data filed at 12/30/14 1300  Gross per 24 hour  Intake    510 ml  Output    300 ml  Net    210 ml   Filed Weights   12/28/14 0500 12/29/14 0443 12/30/14 0508  Weight: 71.3 kg (157 lb 3 oz) 72.8 kg (160 lb 7.9 oz) 73.256 kg (161 lb 8 oz)    Exam:   General:  Alert, oriented   Cardiovascular: s1,s2 irregular   Respiratory: LL crackles   Abdomen: soft, nt,nd   Musculoskeletal: no mild leg edema  Data Reviewed: Basic Metabolic Panel:  Recent Labs Lab 12/27/14 0109 12/27/14 1013 12/28/14 0257 12/29/14 0436 12/30/14 0446  NA 133* 137 130* 135 133*  K 4.9 3.9 3.3* 4.7 4.2  CL 99 100 98 97 98  CO2 GLUCOSE 186* 205* 110* 150* 110*  BUN 58* 53* 50* 48* 45*   CREATININE 1.75* 1.61* 1.31* 1.36* 1.14*  CALCIUM 8.8 8.4 8.2* 8.5 8.3*   Liver Function Tests:  Recent Labs Lab 12/26/14 1800 12/27/14 1013  AST 35 33  ALT 22 21  ALKPHOS 110 96  BILITOT 0.9 0.7  PROT 7.2 5.9*  ALBUMIN 3.3* 2.7*   No results for input(s): LIPASE, AMYLASE in the last 168 hours. No results for input(s): AMMONIA in the last 168 hours. CBC:  Recent Labs Lab 12/26/14 1800 12/27/14 1013 12/29/14 0436  WBC 11.3* 16.7* 12.3*  NEUTROABS 8.6*  --   --   HGB 12.7 10.7* 10.8*  HCT 38.0 33.0* 33.9*  MCV 97.9 97.6 100.9*  PLT 202 171 158   Cardiac Enzymes:  Recent Labs Lab 12/26/14 1800 12/27/14 0109 12/27/14 1013  TROPONINI 0.09* 0.09* 0.12*   BNP (last 3 results)  Recent Labs  12/26/14 1800 12/27/14 0344 12/30/14 0924  BNP 1404.5* 1339.0* 1176.8*    ProBNP (last 3 results)  Recent Labs  02/03/14 2158 02/12/14 1421 07/22/14 1213  PROBNP 2950.0* 1542.0* 1209.0*    CBG:  Recent Labs Lab 12/29/14 1224 12/29/14 1646 12/29/14 2152 12/30/14 0622 12/30/14 1143  GLUCAP 176* 128* 161* 109* 186*    Recent Results (from the past 240 hour(s))  Urine culture     Status: None   Collection Time: 12/26/14  7:14 PM  Result Value Ref Range Status   Specimen Description URINE, CLEAN CATCH  Final   Special Requests ADDED 4098 12/27/14  Final   Colony Count   Final    >=100,000 COLONIES/ML Performed at [facility]    Culture   Final    ESCHERICHIA COLI Performed at [facility]    Report Status 12/29/2014 FINAL  Final   Organism ID, Bacteria ESCHERICHIA COLI  Final      Susceptibility   Escherichia coli - MIC*    AMPICILLIN >=32 RESISTANT Resistant     CEFAZOLIN <=4 SENSITIVE Sensitive     CEFTRIAXONE <=1 SENSITIVE Sensitive     CIPROFLOXACIN >=4 RESISTANT Resistant     GENTAMICIN <=1 SENSITIVE Sensitive     LEVOFLOXACIN >=8 RESISTANT Resistant     NITROFURANTOIN <=16 SENSITIVE Sensitive     TOBRAMYCIN <=1 SENSITIVE  Sensitive     TRIMETH/SULFA <=20 SENSITIVE Sensitive     PIP/TAZO <=4 SENSITIVE Sensitive     * ESCHERICHIA COLI     Studies: No results found.  Scheduled Meds: . allopurinol  300 mg Oral Daily  . amiodarone  200 mg Oral BID  . apixaban  2.5 mg Oral BID  . atorvastatin  10 mg Oral QHS  . cadexomer iodine  1 application Topical QODAY  . calcium-vitamin D  1 tablet Oral Daily  . cefTRIAXone (ROCEPHIN)  IV  1 g Intravenous Q24H  . furosemide  40 mg Intravenous BID  . gabapentin  300 mg Oral QHS  . insulin aspart  0-9 Units Subcutaneous TID WC  . levothyroxine  112 mcg Oral QAC breakfast  . metoprolol succinate  12.5 mg Oral Daily  . multivitamin with minerals  1 tablet Oral Daily  . potassium chloride  20 mEq Oral BID  . silver sulfADIAZINE   Topical Daily  . sodium chloride  3 mL Intravenous Q12H  . sodium chloride  3 mL Intravenous Q12H   Continuous Infusions:   Active Problems:   Acute kidney injury   UTI (lower urinary tract infection)   Hyperkalemia   Diastolic CHF, acute on chronic   Elevated troponin I level   SOB (shortness of breath)    Time spent: >35 minutes     Jahlia Omura A  [facility] Pager [phone]. If 7PM-7AM, please contact night-coverage at [email or web address], password [facility] 12/30/2014, 2:44 PM  LOS: 4 days

## 2014-12-30 NOTE — Progress Notes (Signed)
SUBJECTIVE:  Sitting up in chair  OBJECTIVE:   Vitals:   Filed Vitals:   12/29/14 1415 12/29/14 2007 12/29/14 2012 12/30/14 0508  BP: 97/62  106/66 91/57  Pulse: 106 106 106 102  Temp: 97.8 F (36.6 C)  97.7 F (36.5 C) 97.5 F (36.4 C)  TempSrc: Oral  Oral Oral  Resp:  20 21 18   Height:      Weight:    161 lb 8 oz (73.256 kg)  SpO2: 100% 97% 98% 98%   I&O's:   Intake/Output Summary (Last 24 hours) at 12/30/14 0919 Last data filed at 12/30/14 0400  Gross per 24 hour  Intake    240 ml  Output    450 ml  Net   -210 ml   TELEMETRY: Reviewed telemetry pt in atrial fibrillation:     PHYSICAL EXAM General: Well developed, well nourished, in no acute distress Head: Eyes PERRLA, No xanthomas.   Normal cephalic and atramatic  Lungs:   Clear bilaterally to auscultation and percussion. Heart:   Irregularly irregularS1 S2 Pulses are 2+ & equal. Abdomen: Bowel sounds are positive, abdomen soft and non-tender without masses Extremities:   No clubbing, cyanosis or edema.  DP +1 Neuro: Alert and oriented X 3. Psych:  Good affect, responds appropriately   LABS: Basic Metabolic Panel:  Recent Labs  29/56/2101/11/14 0436 12/30/14 0446  NA 135 133*  K 4.7 4.2  CL 97 98  CO2 26 22  GLUCOSE 150* 110*  BUN 48* 45*  CREATININE 1.36* 1.14*  CALCIUM 8.5 8.3*   Liver Function Tests:  Recent Labs  12/27/14 1013  AST 33  ALT 21  ALKPHOS 96  BILITOT 0.7  PROT 5.9*  ALBUMIN 2.7*   No results for input(s): LIPASE, AMYLASE in the last 72 hours. CBC:  Recent Labs  12/27/14 1013 12/29/14 0436  WBC 16.7* 12.3*  HGB 10.7* 10.8*  HCT 33.0* 33.9*  MCV 97.6 100.9*  PLT 171 158   Cardiac Enzymes:  Recent Labs  12/27/14 1013  TROPONINI 0.12*   BNP: Invalid input(s): POCBNP D-Dimer: No results for input(s): DDIMER in the last 72 hours. Hemoglobin A1C: No results for input(s): HGBA1C in the last 72 hours. Fasting Lipid Panel: No results for input(s): CHOL, HDL,  LDLCALC, TRIG, CHOLHDL, LDLDIRECT in the last 72 hours. Thyroid Function Tests: No results for input(s): TSH, T4TOTAL, T3FREE, THYROIDAB in the last 72 hours.  Invalid input(s): FREET3 Anemia Panel: No results for input(s): VITAMINB12, FOLATE, FERRITIN, TIBC, IRON, RETICCTPCT in the last 72 hours. Coag Panel:   Lab Results  Component Value Date   INR 1.13 10/20/2014   INR 1.30 04/08/2013   INR 1.43 05/29/2012    RADIOLOGY: Dg Chest 2 View  12/28/2014   CLINICAL DATA:  Atrial fibrillation, peripheral arterial disease, hypertension, CHF  EXAM: CHEST  2 VIEW  COMPARISON:  12/26/2014  FINDINGS: Mild bilateral interstitial thickening. No pleural effusion, focal consolidation or pneumothorax. Stable cardiomegaly. Thoracic aortic atherosclerosis. Prominence of the central pulmonary vasculature.  No acute osseous abnormality.  IMPRESSION: Cardiomegaly with mild interstitial edema.   Electronically Signed   By: Elige KoHetal  Patel   On: 12/28/2014 12:34   Dg Chest Port 1 View  12/26/2014   CLINICAL DATA:  Weakness and respiratory distress.  EXAM: PORTABLE CHEST - 1 VIEW  COMPARISON:  5 hours prior.  FINDINGS: Stable cardiomegaly and mitral annulus calcifications. Slight decrease in vascular congestion. Minimal left basilar subsegmental atelectasis. No consolidation to suggest pneumonia. No  large pleural effusion or pneumothorax.  IMPRESSION: Stable cardiomegaly.  Decreased vascular congestion.   Electronically Signed   By: Rubye Oaks M.D.   On: 12/26/2014 23:02   Dg Chest Port 1 View  12/26/2014   CLINICAL DATA:  Shortness of breath  EXAM: PORTABLE CHEST - 1 VIEW  COMPARISON:  02/10/2014  FINDINGS: There is cardiomegaly. Mild vascular congestion. No overt edema. No confluent opacities or effusions. No acute bony abnormality. Dense mitral valve annular calcifications again noted.  IMPRESSION: Cardiomegaly, vascular congestion.   Electronically Signed   By: Charlett Nose M.D.   On: 12/26/2014 18:08   Mm  Digital Diagnostic Unilat L  12/11/2014   CLINICAL DATA:  Six month followup evaluation of a benign concordant left breast ultrasound-guided core biopsy. Patient is currently wheelchair-bound and unable to stand for the examination.  EXAM: DIGITAL DIAGNOSTIC left breast MAMMOGRAM WITH CAD  COMPARISON:  06/05/2014, 05/22/2014, 05/16/2013.  ACR Breast Density Category b: There are scattered areas of fibroglandular density.  FINDINGS: The small focal density located medially within the left breast has decreased and is compatible with evolving fat necrosis. There are no findings worrisome for developing malignancy within the left breast. Limited evaluation in the left MLO projection due to the patient's physical status.  Mammographic images were processed with CAD.  IMPRESSION: No findings worrisome for developing malignancy. If felt to be clinically indicated given the patient's medical status, recommend bilateral screening mammography in July, 2016.  RECOMMENDATION: Screening mammography in July, 2016 if felt to be clinically indicated due to the patient's age and medical status.  I have discussed the findings and recommendations with the patient. Results were also provided in writing at the conclusion of the visit. If applicable, a reminder letter will be sent to the patient regarding the next appointment.  BI-RADS CATEGORY  2: Benign.   Electronically Signed   By: Rolla Plate M.D.   On: 12/11/2014 10:56   ASSESSMENT/PLAN:  1. Acute on chronic diastolic CHF - had 6lb weight gain over 4 days. Up 12 pounds from admission (?accuracy of weights). No CP. BNP elevated at 1339 and vascular congestion on chest xray. Repeat echo pending. Continue Lasix  IV BID and follow renal function closely. Repeat chest xray with mild pulmonary congestion. Lungs are clear today and no LE edema. She is 1.7L net neg but weight is up (?accuracy of scales - all other weights were in ICU). Recheck BNP to see trend.   2. Chronic atrial fibrillation/flutter borderline rate controlled. Cannot increase BB further due to soft BP. Continue renally dosed Apixaban/BB.  Amio  BID added for better rate control. She had been on this in the past but due to controlled rates it was stopped.  3. Mild AS - repeat 2D echo with at least moderate AS - I discussed the findings of the echo with Lettie and the natural history of AS/MS and MR. This is probably contributing to her SOB and CHF. She is not interested in any surgical intervention if further testing with TEE would indicate severe AS or MS/MR so will not proceed with further workup and continue to treat medically.  4. Moderate MS/MR with moderate pulmonary HTN with PASP 5. OSA on CPAP 6. Severe PVD 7. Type II DM - per IM 8. HTN - controlled on BB 9. Hypokalemia - repleted 10. Elevated Troponin - minimally elevated with flat trend most likely secondary to demand ischemia with acute CHF.  11. Dementia 12. Hyponatremia secondary to CHF-  resolved 13. UTI on antibiotics      Quintella Reichert, MD  12/30/2014  9:19 AM

## 2014-12-31 ENCOUNTER — Inpatient Hospital Stay (HOSPITAL_COMMUNITY): Payer: Medicare Other

## 2014-12-31 LAB — BASIC METABOLIC PANEL
Anion gap: 11 (ref 5–15)
BUN: 49 mg/dL — ABNORMAL HIGH (ref 6–23)
CALCIUM: 8.3 mg/dL — AB (ref 8.4–10.5)
CHLORIDE: 97 mmol/L (ref 96–112)
CO2: 24 mmol/L (ref 19–32)
Creatinine, Ser: 1.31 mg/dL — ABNORMAL HIGH (ref 0.50–1.10)
GFR calc Af Amer: 41 mL/min — ABNORMAL LOW (ref >90)
GFR calc non Af Amer: 35 mL/min — ABNORMAL LOW (ref >90)
GLUCOSE: 116 mg/dL — AB (ref 70–99)
Potassium: 5.1 mmol/L (ref 3.5–5.1)
Sodium: 132 mmol/L — ABNORMAL LOW (ref 135–145)

## 2014-12-31 LAB — CBC
HCT: 33.8 % — ABNORMAL LOW (ref 36.0–46.0)
Hemoglobin: 11 g/dL — ABNORMAL LOW (ref 12.0–15.0)
MCH: 31.3 pg (ref 26.0–34.0)
MCHC: 32.5 g/dL (ref 30.0–36.0)
MCV: 96.3 fL (ref 78.0–100.0)
PLATELETS: 168 10*3/uL (ref 150–400)
RBC: 3.51 MIL/uL — ABNORMAL LOW (ref 3.87–5.11)
RDW: 17.1 % — AB (ref 11.5–15.5)
WBC: 9.1 10*3/uL (ref 4.0–10.5)

## 2014-12-31 LAB — GLUCOSE, CAPILLARY
GLUCOSE-CAPILLARY: 168 mg/dL — AB (ref 70–99)
Glucose-Capillary: 106 mg/dL — ABNORMAL HIGH (ref 70–99)
Glucose-Capillary: 141 mg/dL — ABNORMAL HIGH (ref 70–99)
Glucose-Capillary: 138 mg/dL — ABNORMAL HIGH (ref 70–99)

## 2014-12-31 MED ORDER — LEVALBUTEROL HCL 0.63 MG/3ML IN NEBU: 0.6300 mg | INHALATION_SOLUTION | Freq: Four times a day (QID) | RESPIRATORY_TRACT | Status: DC | PRN

## 2014-12-31 MED ORDER — FUROSEMIDE 10 MG/ML IJ SOLN
40.0000 mg | Freq: Three times a day (TID) | INTRAMUSCULAR | Status: DC
  Administered 2014-12-31 – 2015-01-02 (×6): 40 mg via INTRAVENOUS
  Filled 2014-12-31 (×6): qty 4

## 2014-12-31 NOTE — Progress Notes (Signed)
Physical Therapy Treatment Patient Details Name: [name] MRN: [id number] DOB: 07/25/1924 Today's Date: 12/31/2014    History of Present Illness [name] is a 81 y.o. woman with multiple comorbidities and an extensive cardiac history including chronic diastolic HF, mild aortic and mitral valve stenosis (last echo 12/2013), and atrial fibrillation (chronic anticoagulation with Eliquis) who was in her baseline state of health until 2/26. Pt with acute kidney injury, UTI, weakness and demand ischemia     PT Comments    Pt with increased mobility today able to ambulate in room, perform bil LE HEP, and increase transfers. Pt encouraged to be OOB daily as she states she did not get up yesterday and to continue activity. Will continue to follow.   Follow Up Recommendations  Home health PT;Supervision/Assistance - 24 hour     Equipment Recommendations       Recommendations for Other Services       Precautions / Restrictions Precautions Precautions: Fall    Mobility  Bed Mobility Overal bed mobility: Needs Assistance Bed Mobility: Supine to Sit     Supine to sit: Min assist     General bed mobility comments: pt able to pivot legs to EOB with min assist to elevate trunk from surface, increased time and cues  Transfers Overall transfer level: Needs assistance     Sit to Stand: Min assist         General transfer comment: cues for hand placement, anterior translation with assist for elevation from surface and maintained anterior translation  Ambulation/Gait Ambulation/Gait assistance: Min assist Ambulation Distance (Feet): 30 Feet Assistive device: Rolling walker (2 wheeled) Gait Pattern/deviations: Step-through pattern;Decreased stride length;Trunk flexed   Gait velocity interpretation: Below normal speed for age/gender General Gait Details: cues for posture and position in RW   Stairs            Wheelchair Mobility    Modified Rankin (Stroke  Patients Only)       Balance                                    Cognition Arousal/Alertness: Awake/alert Behavior During Therapy: WFL for tasks assessed/performed Overall Cognitive Status: Impaired/Different from baseline               Problem Solving: Slow processing      Exercises General Exercises - Lower Extremity Long Arc Quad: AROM;Seated;Both;20 reps Hip ABduction/ADduction: AROM;Seated;Both;20 reps Hip Flexion/Marching: AROM;Seated;Both;20 reps Toe Raises: AROM;Seated;Both;20 reps Heel Raises: AROM;Seated;Both;20 reps    General Comments        Pertinent Vitals/Pain Pain Assessment: No/denies pain    Home Living                      Prior Function            PT Goals (current goals can now be found in the care plan section) Progress towards PT goals: Progressing toward goals    Frequency       PT Plan Current plan remains appropriate    Co-evaluation             End of Session Equipment Utilized During Treatment: Gait belt Activity Tolerance: Patient tolerated treatment well Patient left: in chair;with call bell/phone within reach;with chair alarm set     Time: 1135-1153 PT Time Calculation (min) (ACUTE ONLY): 18 min  Charges:  $Gait Training: 8-22 mins  G CodesDelorse Lek 01-19-15, 2:27 PM Delaney Meigs, PT [phone]

## 2014-12-31 NOTE — Progress Notes (Signed)
[facility] PROGRESS NOTE  [name] ZOX:[id number] DOB: 1924-09-11 DOA: 12/26/2014 PCP: Ginette Otto, MD  Assessment/Plan: 81 y/o female with PMH of HTN, DM, CKD, OSA/CPAP, Diastolic CHF, mild Aortic Stenosis, A fib (on Eliquis) presented with generalized weakness, fatigue, chills, with sujective fever; patient also reported weight gain, DOE.  -patient is admitted with UTI, CHF with mild pulmonary edema, hyperkalemia, AKI   1. Acute on chronic CHF, diastolic HF, pulmonary HTN; probable CHF worse due to AS/MS with PAF; initially CXR showed mild edema, which has improved after diuresis/BiPAP;  patient is on diuresis with lasix IV 40 TID will cont BB, not on ACE due to AKI; monitor renal function  Unclear if weight is accurate,   2. Mild acute hypoxic respiratory failure on admission likely due to CHF; hypoxia resolving on diuresis/BiPAP at night for OSA; cont as above   3. UTI started on IV atx, prelim urine cultures: E coli, sensitive to ceftriaxone and cefazolin. Will change antibiotics to keflex. Day 5-7 of antibiotics.   4. Initial Hyperkalemia on admission; but currently hypokalemia with diuresis; started potassium supplements; monitor lytes with hyponatremia . Will discontinue potasium supplemenet  5. PAF on Eliquis; cont BB; added amio per cards;  AS/MS: per cardiology, patient/family discussions: patient is not interested in surgical intervention;  6. Two chronic wounds on right foot. Patietn with PAD. Followed by podiatrist, Dr. Donzetta Kohut at [facility]. Next appointment on Monday, 01/04/15; appreciate wound care evaluation. Continue with wound care.   7. DM diet controlled; cont ISS while inpatient      Code Status: DNR Family Communication: d/w patient  And son who was at bedside.  Disposition Plan: home pend clinical improvement;  Echo Impressions:  - The patient was in atrial fibrillation. Normal LV size with mild LV hypertrophy.  Moderately dilated RV with mildly decreased systolic function. The aortic valve was severely calcified. Mean gradient was only 15 mmHg suggesting mild AS but calculated valve area 0.73 cm^2, suggesting severe. Visually at least moderate aortic stenosis. This may need further characterization by TEE. The mitral valve was heavily calcified. There appeared to be moderate mitral stenosis with mean gradient 8 mmHg. There was at least moderate MR but shadowing from calcified valve makes this difficult to fully evaluate. Severe TR. Moderate pulmonary hypertension. Dilated IVC. Overall, TEE may be helpful to assess the valves more closely.  Consultants:  Cardiology   Procedures:  Echo   Antibiotics:  Ceftriaxone 2/27>>>   (indicate start date, and stop date if known)  HPI/Subjective: Breathing ok, had SOB just walking to the door.    Objective: Filed Vitals:   12/31/14 0835  BP: 110/73  Pulse: 100  Temp:   Resp:     Intake/Output Summary (Last 24 hours) at 12/31/14 1509 Last data filed at 12/31/14 1502  Gross per 24 hour  Intake    120 ml  Output    341 ml  Net   -221 ml   Filed Weights   12/29/14 0443 12/30/14 0508 12/31/14 0518  Weight: 72.8 kg (160 lb 7.9 oz) 73.256 kg (161 lb 8 oz) 73.9 kg (162 lb 14.7 oz)    Exam:   General:  Alert, oriented   Cardiovascular: s1,s2 irregular   Respiratory: LL crackles   Abdomen: soft, nt,nd   Musculoskeletal: no mild leg edema    Data Reviewed: Basic Metabolic Panel:  Recent Labs Lab 12/27/14 1013 12/28/14 0257 12/29/14 0436 12/30/14 0446 12/31/14 0345  NA 137 130* 135 133*  132*  K 3.9 3.3* 4.7 4.2 5.1  CL 100 98 97 98 97  CO2 GLUCOSE 205* 110* 150* 110* 116*  BUN 53* 50* 48* 45* 49*  CREATININE 1.61* 1.31* 1.36* 1.14* 1.31*  CALCIUM 8.4 8.2* 8.5 8.3* 8.3*   Liver Function Tests:  Recent Labs Lab 12/26/14 1800 12/27/14 1013  AST 35 33  ALT 22 21  ALKPHOS 110 96   BILITOT 0.9 0.7  PROT 7.2 5.9*  ALBUMIN 3.3* 2.7*   No results for input(s): LIPASE, AMYLASE in the last 168 hours. No results for input(s): AMMONIA in the last 168 hours. CBC:  Recent Labs Lab 12/26/14 1800 12/27/14 1013 12/29/14 0436 12/31/14 0345  WBC 11.3* 16.7* 12.3* 9.1  NEUTROABS 8.6*  --   --   --   HGB 12.7 10.7* 10.8* 11.0*  HCT 38.0 33.0* 33.9* 33.8*  MCV 97.9 97.6 100.9* 96.3  PLT 202 171 158 168   Cardiac Enzymes:  Recent Labs Lab 12/26/14 1800 12/27/14 0109 12/27/14 1013  TROPONINI 0.09* 0.09* 0.12*   BNP (last 3 results)  Recent Labs  12/26/14 1800 12/27/14 0344 12/30/14 0924  BNP 1404.5* 1339.0* 1176.8*    ProBNP (last 3 results)  Recent Labs  02/03/14 2158 02/12/14 1421 07/22/14 1213  PROBNP 2950.0* 1542.0* 1209.0*    CBG:  Recent Labs Lab 12/30/14 1143 12/30/14 1633 12/30/14 2113 12/31/14 0648 12/31/14 1140  GLUCAP 186* 140* 129* 106* 168*    Recent Results (from the past 240 hour(s))  Urine culture     Status: None   Collection Time: 12/26/14  7:14 PM  Result Value Ref Range Status   Specimen Description URINE, CLEAN CATCH  Final   Special Requests ADDED 1610 12/27/14  Final   Colony Count   Final    >=100,000 COLONIES/ML Performed at [facility]    Culture   Final    ESCHERICHIA COLI Performed at [facility]    Report Status 12/29/2014 FINAL  Final   Organism ID, Bacteria ESCHERICHIA COLI  Final      Susceptibility   Escherichia coli - MIC*    AMPICILLIN >=32 RESISTANT Resistant     CEFAZOLIN <=4 SENSITIVE Sensitive     CEFTRIAXONE <=1 SENSITIVE Sensitive     CIPROFLOXACIN >=4 RESISTANT Resistant     GENTAMICIN <=1 SENSITIVE Sensitive     LEVOFLOXACIN >=8 RESISTANT Resistant     NITROFURANTOIN <=16 SENSITIVE Sensitive     TOBRAMYCIN <=1 SENSITIVE Sensitive     TRIMETH/SULFA <=20 SENSITIVE Sensitive     PIP/TAZO <=4 SENSITIVE Sensitive     * ESCHERICHIA COLI     Studies: Dg Chest 2  View  12/31/2014   CLINICAL DATA:  CHF. Short of breath. Chronic back pain. Initial encounter.  EXAM: CHEST  2 VIEW  COMPARISON:  12/28/2014.  FINDINGS: Cardiomegaly is present. There is pulmonary vascular congestion. Basilar airspace disease is present. Probable small bilateral pleural effusions. Monitoring leads project over the chest. Aortic arch tortuosity and atherosclerosis.  IMPRESSION: Findings compatible with mild CHF.   Electronically Signed   By: Andreas Newport M.D.   On: 12/31/2014 13:43    Scheduled Meds: . allopurinol  300 mg Oral Daily  . amiodarone  200 mg Oral BID  . apixaban  2.5 mg Oral BID  . atorvastatin  10 mg Oral QHS  . cadexomer iodine  1 application Topical QODAY  . calcium-vitamin D  1 tablet Oral Daily  .  cephALEXin  500 mg Oral Q12H  . furosemide  40 mg Intravenous Q8H  . gabapentin  300 mg Oral QHS  . insulin aspart  0-9 Units Subcutaneous TID WC  . levothyroxine  112 mcg Oral QAC breakfast  . metoprolol succinate  12.5 mg Oral Daily  . multivitamin with minerals  1 tablet Oral Daily  . silver sulfADIAZINE   Topical Daily  . sodium chloride  3 mL Intravenous Q12H  . sodium chloride  3 mL Intravenous Q12H   Continuous Infusions:   Active Problems:   Acute kidney injury   UTI (lower urinary tract infection)   Hyperkalemia   Diastolic CHF, acute on chronic   Elevated troponin I level   SOB (shortness of breath)   Acute renal failure syndrome    Time spent: >35 minutes     Janzen Sacks A  [facility] Pager [phone]. If 7PM-7AM, please contact night-coverage at [email or web address], password [email or web address] 12/31/2014, 3:09 PM  LOS: 5 days

## 2014-12-31 NOTE — Progress Notes (Signed)
Chest xray showed mild CHF.  Will increase Lasix to 40mg  IV TID

## 2014-12-31 NOTE — Clinical Social Work Note (Signed)
CSW spoke with patient after Dr stating family is concerned about patient returning directly to home.  CSW spoke with pt son at bedside- family is interested in CIR but is not agreeable to SNF.  CSW talked with PT- will see if pt is appropriate.  CSW will signing off.  Merlyn LotJenna Holoman, LCSWA Clinical Social Worker [phone]

## 2014-12-31 NOTE — Progress Notes (Signed)
Patient Name: Judith BruinsClarice V Heick Date of Encounter: 12/31/2014  Active Problems:   Acute kidney injury   UTI (lower urinary tract infection)   Hyperkalemia   Diastolic CHF, acute on chronic   Elevated troponin I level   SOB (shortness of breath)   Acute renal failure syndrome   Primary Cardiologist: Dr Mayford Knifeurner  Patient Profile: 81 yo female w/ hx HFpEF, atrial fib, mild AS/MS, OSA on CPAP, PAD, DM, HTN, was admitted 02/27 w/ SOB. Cards following for CHF and afib.  SUBJECTIVE: Breathing better, no chest pain  OBJECTIVE Filed Vitals:   12/30/14 2031 12/31/14 0518 12/31/14 0519 12/31/14 0835  BP: 110/72  114/74 110/73  Pulse: 103  101 100  Temp: 97.5 F (36.4 C)  97.5 F (36.4 C)   TempSrc: Oral  Oral   Resp: 21  20   Height:      Weight:  162 lb 14.7 oz (73.9 kg)    SpO2: 99%  100%     Intake/Output Summary (Last 24 hours) at 12/31/14 1053 Last data filed at 12/31/14 0926  Gross per 24 hour  Intake    240 ml  Output    140 ml  Net    100 ml   Filed Weights   12/29/14 0443 12/30/14 0508 12/31/14 0518  Weight: 160 lb 7.9 oz (72.8 kg) 161 lb 8 oz (73.256 kg) 162 lb 14.7 oz (73.9 kg)    PHYSICAL EXAM General: Well developed, well nourished, female in no acute distress. Head: Normocephalic, atraumatic.  Neck: Supple without bruits, JVD minimal elevation.  Lungs:  Resp regular and unlabored, rales bases, improved air exchange. Heart: RRR, S1, S2, no S3, S4, 2/6 murmur; no rub. Abdomen: Soft, non-tender, non-distended, BS + x 4.  Extremities: No clubbing, cyanosis, no edema.  Neuro: Alert and oriented X 3. Moves all extremities spontaneously. Psych: Normal affect.  LABS: CBC: Recent Labs  12/29/14 0436 12/31/14 0345  WBC 12.3* 9.1  HGB 10.8* 11.0*  HCT 33.9* 33.8*  MCV 100.9* 96.3  PLT 158 168   Basic Metabolic Panel: Recent Labs  12/30/14 0446 12/31/14 0345  NA 133* 132*  K 4.2 5.1  CL 98 97  CO2 22 24  GLUCOSE 110* 116*  BUN 45* 49*    CREATININE 1.14* 1.31*  CALCIUM 8.3* 8.3*   BNP:  B NATRIURETIC PEPTIDE  Date/Time Value Ref Range Status  12/30/2014 09:24 AM 1176.8* 0.0 - 100.0 pg/mL Final  12/27/2014 03:44 AM 1339.0* 0.0 - 100.0 pg/mL Final    TELE:  Atrial fib >> ? SR and atrial flutter      ECHO: 12/28/2014 Study Conclusions - Left ventricle: The cavity size was normal. Wall thickness was increased in a pattern of mild LVH. Systolic function was normal. The estimated ejection fraction was in the range of 60% to 65%. Indeterminant diastolic function (atrial fibrillation). Wall motion was normal; there were no regional wall motion abnormalities. - Ventricular septum: D-shaped interventricular septum suggestiveof RV pressure/volume overload. - Aortic valve: Trileaflet; severely calcified leaflets. There was trivial regurgitation. Mild AS by mean gradient, severe by valve area. Visually, the valve looks at least moderate stenotic. Mean gradient (S): 15 mm Hg. Valve area (VTI): 0.73 cm^2. - Mitral valve: Moderately to severely calcified annulus. Moderately calcified leaflets . The findings are consistent with moderate stenosis. There is at least moderate regurgitation, this may not be fully characterized as there is a lot of shadowing from the calcified mitral valve. Mean gradient (D):  8 mm Hg. Valve area by continuity equation (using LVOT flow): 1.15 cm^2. - Left atrium: The atrium was severely dilated. - Right ventricle: The cavity size was moderately dilated. Systolic function was mildly reduced. - Right atrium: The atrium was severely dilated. - Tricuspid valve: The leaflets do not fully coapt. There was severe regurgitation with hepatic vein systolic doppler flow reversal. Peak RV-RA gradient (S): 46 mm Hg. - Pulmonary arteries: PA peak pressure: 61 mm Hg (S). - Systemic veins: IVC measured 2.7 cm with < 50% respirophasicvariation, suggesting RA pressure 15  mmHg. Impressions: - The patient was in atrial fibrillation. Normal LV size with mild LV hypertrophy. Moderately dilated RV with mildly decreased systolic function. The aortic valve was severely calcified. Mean gradient was only 15 mmHg suggesting mild AS but calculated valvearea 0.73 cm^2, suggesting severe. Visually at least moderateaortic stenosis. This may need further characterization by TEE. The mitral valve was heavily calcified. There appeared to be moderate mitral stenosis with mean gradient 8 mmHg. There was at least moderate MR but shadowing from calcified valve makes thisdifficult to fully evaluate. Severe TR. Moderate pulmonaryhypertension. Dilated IVC. Overall, TEE may be helpful to assessthe valves more closely.  Current Medications:  . allopurinol  300 mg Oral Daily  . amiodarone  200 mg Oral BID  . apixaban  2.5 mg Oral BID  . atorvastatin  10 mg Oral QHS  . cadexomer iodine  1 application Topical QODAY  . calcium-vitamin D  1 tablet Oral Daily  . cephALEXin  500 mg Oral Q12H  . furosemide  40 mg Intravenous BID  . gabapentin  300 mg Oral QHS  . insulin aspart  0-9 Units Subcutaneous TID WC  . levothyroxine  112 mcg Oral QAC breakfast  . metoprolol succinate  12.5 mg Oral Daily  . multivitamin with minerals  1 tablet Oral Daily  . silver sulfADIAZINE   Topical Daily  . sodium chloride  3 mL Intravenous Q12H  . sodium chloride  3 mL Intravenous Q12H      ASSESSMENT AND PLAN: ASSESSMENT/PLAN:  1. Acute on chronic diastolic CHF - had 6lb weight gain over 4 days. Up 5 pounds from admission (?accuracy of weights). No CP. BNP elevated at 1339 and vascular congestion on chest xray.   Repeat echo results above.Her weight is above previous dry weights, and CVP pressure was 15.  Continue Lasix  IV BID and continue to follow renal function closely.   Repeat chest xray 03/02 with mild pulmonary congestion. Lungs are clear today and no LE edema. She  is 1.4L net neg but weight is up. BNP trending down slightly.  2. Chronic atrial fibrillation/flutter borderline rate controlled. Cannot increase BB further due to soft BP. Continue renally dosed Apixaban/BB. Amio  BID added for better rate control. She had been on this in the past but due to controlled rates it was stopped.   3. Mild AS - repeat 2D echo with at least moderate AS - I discussed the findings of the echo with Katha and the natural history of AS/MS and MR. This is probably contributing to her SOB and CHF. She is not interested in any surgical intervention if further testing with TEE would indicate severe AS or MS/MR so will not proceed with further workup and continue to treat medically.   4. Moderate MS/MR with moderate pulmonary HTN with PASP  5. OSA on CPAP- per IM 6. Severe PVD - per IM 7. Type II DM - per IM 8.  HTN - controlled on BB 9. Hypokalemia - repleted 10. Elevated Troponin - minimally elevated with flat trend most likely secondary to demand ischemia with acute CHF.  11. Dementia - per IM 12. Hyponatremia secondary to CHF- resolved 13. UTI on antibiotics  Melida Quitter , PA-C 10:53 AM 12/31/2014

## 2015-01-01 LAB — BASIC METABOLIC PANEL
Anion gap: 6 (ref 5–15)
BUN: 46 mg/dL — ABNORMAL HIGH (ref 6–23)
CO2: 27 mmol/L (ref 19–32)
Calcium: 8.3 mg/dL — ABNORMAL LOW (ref 8.4–10.5)
Chloride: 99 mmol/L (ref 96–112)
Creatinine, Ser: 1.38 mg/dL — ABNORMAL HIGH (ref 0.50–1.10)
GFR, EST AFRICAN AMERICAN: 38 mL/min — AB (ref >90)
GFR, EST NON AFRICAN AMERICAN: 33 mL/min — AB (ref >90)
GLUCOSE: 118 mg/dL — AB (ref 70–99)
Potassium: 4 mmol/L (ref 3.5–5.1)
Sodium: 132 mmol/L — ABNORMAL LOW (ref 135–145)

## 2015-01-01 LAB — GLUCOSE, CAPILLARY
GLUCOSE-CAPILLARY: 100 mg/dL — AB (ref 70–99)
GLUCOSE-CAPILLARY: 179 mg/dL — AB (ref 70–99)
GLUCOSE-CAPILLARY: 163 mg/dL — AB (ref 70–99)
Glucose-Capillary: 137 mg/dL — ABNORMAL HIGH (ref 70–99)

## 2015-01-01 LAB — CBC
HEMATOCRIT: 31.3 % — AB (ref 36.0–46.0)
HEMOGLOBIN: 10.3 g/dL — AB (ref 12.0–15.0)
MCH: 31.9 pg (ref 26.0–34.0)
MCHC: 32.9 g/dL (ref 30.0–36.0)
MCV: 96.9 fL (ref 78.0–100.0)
Platelets: 178 10*3/uL (ref 150–400)
RBC: 3.23 MIL/uL — ABNORMAL LOW (ref 3.87–5.11)
RDW: 17 % — AB (ref 11.5–15.5)
WBC: 10.9 10*3/uL — ABNORMAL HIGH (ref 4.0–10.5)

## 2015-01-01 NOTE — Progress Notes (Signed)
[facility] PROGRESS NOTE  [name] AVW:[id number] DOB: 17-Oct-1924 DOA: 12/26/2014 PCP: Ginette OttoSTONEKING,HAL THOMAS, MD  Assessment/Plan: 81 y/o female with PMH of HTN, DM, CKD, OSA/CPAP, Diastolic CHF, mild Aortic Stenosis, A fib (on Eliquis) presented with generalized weakness, fatigue, chills, with sujective fever; patient also reported weight gain, DOE.  -patient is admitted with UTI, CHF with mild pulmonary edema, hyperkalemia, AKI   1. Acute on chronic CHF, diastolic HF, pulmonary HTN; probable CHF worse due to AS/MS with PAF; initially CXR showed mild edema, which has improved after diuresis/BiPAP;  patient is on diuresis with lasix IV 40 TID will cont BB, not on ACE due to AKI; monitor renal function  Unclear if weight is accurate,   2. Mild acute hypoxic respiratory failure on admission likely due to CHF; hypoxia resolving on diuresis/BiPAP at night for OSA; cont as above   3. UTI started on IV atx, prelim urine cultures: E coli, sensitive to ceftriaxone and cefazolin. Will change antibiotics to keflex. Day 6-7 of antibiotics.   4. Initial Hyperkalemia on admission; resolved.   5. PAF on Eliquis; cont BB; added amio per cards;  AS/MS: per cardiology, patient/family discussions: patient is not interested in surgical intervention;  6. Two chronic wounds on right foot. Patietn with PAD. [facility] Next appointment on Monday, 01/04/15; appreciate wound care evaluation. Continue with wound care.   7. DM diet controlled; cont ISS while inpatient  8-hand with cyanosis: patient notice to have bluish discolorations of hands, her right hand was cold. Pulse difficult to palpate. Dr Arnette FeltsBhrabam evaluated patient, she had good pulse by doppler. No need for arteriogram. Monitor for now.     Code Status: DNR Family Communication: d/w daughter Disposition Plan: home pend clinical improvement;  Echo Impressions:  - The patient was in  atrial fibrillation. Normal LV size with mild LV hypertrophy. Moderately dilated RV with mildly decreased systolic function. The aortic valve was severely calcified. Mean gradient was only 15 mmHg suggesting mild AS but calculated valve area 0.73 cm^2, suggesting severe. Visually at least moderate aortic stenosis. This may need further characterization by TEE. The mitral valve was heavily calcified. There appeared to be moderate mitral stenosis with mean gradient 8 mmHg. There was at least moderate MR but shadowing from calcified valve makes this difficult to fully evaluate. Severe TR. Moderate pulmonary hypertension. Dilated IVC. Overall, TEE may be helpful to assess the valves more closely.  Consultants:  Cardiology   Procedures:  Echo   Antibiotics:  Ceftriaxone 2/27>>>   (indicate start date, and stop date if known)  HPI/Subjective: She is sleepy, taking a nap. Wake up and answer questions. She was able to walk today in the hall. She is breathing better,    Objective: Filed Vitals:   01/01/15 1126  BP: 96/69  Pulse: 98  Temp:   Resp:     Intake/Output Summary (Last 24 hours) at 01/01/15 1344 Last data filed at 01/01/15 0900  Gross per 24 hour  Intake    483 ml  Output      6 ml  Net    477 ml   Filed Weights   12/30/14 0508 12/31/14 0518 01/01/15 0500  Weight: 73.256 kg (161 lb 8 oz) 73.9 kg (162 lb 14.7 oz) 71.9 kg (158 lb 8.2 oz)    Exam:   General:  Alert, oriented   Cardiovascular: s1,s2 irregular   Respiratory: LL crackles   Abdomen: soft, nt,nd   Musculoskeletal:  no mild leg edema    Data Reviewed: Basic Metabolic Panel:  Recent Labs Lab 12/28/14 0257 12/29/14 0436 12/30/14 0446 12/31/14 0345 01/01/15 0345  NA 130* 135 133* 132* 132*  K 3.3* 4.7 4.2 5.1 4.0  CL 98 97 98 97 99  CO2 GLUCOSE 110* 150* 110* 116* 118*  BUN 50* 48* 45* 49* 46*  CREATININE 1.31* 1.36* 1.14* 1.31* 1.38*  CALCIUM  8.2* 8.5 8.3* 8.3* 8.3*   Liver Function Tests:  Recent Labs Lab 12/26/14 1800 12/27/14 1013  AST 35 33  ALT 22 21  ALKPHOS 110 96  BILITOT 0.9 0.7  PROT 7.2 5.9*  ALBUMIN 3.3* 2.7*   No results for input(s): LIPASE, AMYLASE in the last 168 hours. No results for input(s): AMMONIA in the last 168 hours. CBC:  Recent Labs Lab 12/26/14 1800 12/27/14 1013 12/29/14 0436 12/31/14 0345 01/01/15 0345  WBC 11.3* 16.7* 12.3* 9.1 10.9*  NEUTROABS 8.6*  --   --   --   --   HGB 12.7 10.7* 10.8* 11.0* 10.3*  HCT 38.0 33.0* 33.9* 33.8* 31.3*  MCV 97.9 97.6 100.9* 96.3 96.9  PLT 202 171 158 168 178   Cardiac Enzymes:  Recent Labs Lab 12/26/14 1800 12/27/14 0109 12/27/14 1013  TROPONINI 0.09* 0.09* 0.12*   BNP (last 3 results)  Recent Labs  12/26/14 1800 12/27/14 0344 12/30/14 0924  BNP 1404.5* 1339.0* 1176.8*    ProBNP (last 3 results)  Recent Labs  02/03/14 2158 02/12/14 1421 07/22/14 1213  PROBNP 2950.0* 1542.0* 1209.0*    CBG:  Recent Labs Lab 12/31/14 1140 12/31/14 1624 12/31/14 2055 01/01/15 0631 01/01/15 1126  GLUCAP 168* 141* 138* 100* 179*    Recent Results (from the past 240 hour(s))  Urine culture     Status: None   Collection Time: 12/26/14  7:14 PM  Result Value Ref Range Status   Specimen Description URINE, CLEAN CATCH  Final   Special Requests ADDED 1610 12/27/14  Final   Colony Count   Final    >=100,000 COLONIES/ML Performed at [facility]    Culture   Final    ESCHERICHIA COLI Performed at [facility]    Report Status 12/29/2014 FINAL  Final   Organism ID, Bacteria ESCHERICHIA COLI  Final      Susceptibility   Escherichia coli - MIC*    AMPICILLIN >=32 RESISTANT Resistant     CEFAZOLIN <=4 SENSITIVE Sensitive     CEFTRIAXONE <=1 SENSITIVE Sensitive     CIPROFLOXACIN >=4 RESISTANT Resistant     GENTAMICIN <=1 SENSITIVE Sensitive     LEVOFLOXACIN >=8 RESISTANT Resistant     NITROFURANTOIN <=16 SENSITIVE  Sensitive     TOBRAMYCIN <=1 SENSITIVE Sensitive     TRIMETH/SULFA <=20 SENSITIVE Sensitive     PIP/TAZO <=4 SENSITIVE Sensitive     * ESCHERICHIA COLI     Studies: Dg Chest 2 View  12/31/2014   CLINICAL DATA:  CHF. Short of breath. Chronic back pain. Initial encounter.  EXAM: CHEST  2 VIEW  COMPARISON:  12/28/2014.  FINDINGS: Cardiomegaly is present. There is pulmonary vascular congestion. Basilar airspace disease is present. Probable small bilateral pleural effusions. Monitoring leads project over the chest. Aortic arch tortuosity and atherosclerosis.  IMPRESSION: Findings compatible with mild CHF.   Electronically Signed   By: Andreas Newport M.D.   On: 12/31/2014 13:43    Scheduled Meds: . allopurinol  300 mg Oral Daily  .  amiodarone  200 mg Oral BID  . apixaban  2.5 mg Oral BID  . atorvastatin  10 mg Oral QHS  . cadexomer iodine  1 application Topical QODAY  . calcium-vitamin D  1 tablet Oral Daily  . cephALEXin  500 mg Oral Q12H  . furosemide  40 mg Intravenous Q8H  . gabapentin  300 mg Oral QHS  . insulin aspart  0-9 Units Subcutaneous TID WC  . levothyroxine  112 mcg Oral QAC breakfast  . metoprolol succinate  12.5 mg Oral Daily  . multivitamin with minerals  1 tablet Oral Daily  . silver sulfADIAZINE   Topical Daily  . sodium chloride  3 mL Intravenous Q12H  . sodium chloride  3 mL Intravenous Q12H   Continuous Infusions:   Active Problems:   Acute kidney injury   UTI (lower urinary tract infection)   Hyperkalemia   Diastolic CHF, acute on chronic   Elevated troponin I level   SOB (shortness of breath)   Acute renal failure syndrome    Time spent: >35 minutes     Tonnette Zwiebel A  [facility] Pager [phone]. If 7PM-7AM, please contact night-coverage at [email or web address], password [facility] 01/01/2015, 1:44 PM  LOS: 6 days

## 2015-01-01 NOTE — Progress Notes (Signed)
RT placed patient on CPAP. Patient setting is 6 cmH20. Sterile water added to water chamber for humidification. Patient is tolerating well. RT will assess and monitor as needed.

## 2015-01-01 NOTE — Progress Notes (Signed)
Physical Therapy Treatment Patient Details Name: [name] MRN: [id number] DOB: 1924/04/10 Today's Date: 01/01/2015    History of Present Illness [name] is a 81 y.o. woman with multiple comorbidities and an extensive cardiac history including chronic diastolic HF, mild aortic and mitral valve stenosis (last echo 12/2013), and atrial fibrillation (chronic anticoagulation with Eliquis) who was in her baseline state of health until 2/26. Pt with acute kidney injury, UTI, weakness and demand ischemia     PT Comments    Pt with increased ambulation today but continues to fatigue quickly and require assist for anterior translation with all transfers. sats 99% on RA with HR 73. Pt encouraged to continue HEP and increase mobility with staff assist. Discussed D/C options with pt who states she is adamant about return home and has 24hr care but desires HHPT. Will continue to follow. Total assist for pericare after toileting.   Follow Up Recommendations  Home health PT;Supervision/Assistance - 24 hour     Equipment Recommendations  None recommended by PT    Recommendations for Other Services       Precautions / Restrictions Precautions Precautions: Fall Precaution Comments: incontinent    Mobility  Bed Mobility Overal bed mobility: Needs Assistance Bed Mobility: Rolling;Sidelying to Sit Rolling: Supervision Sidelying to sit: Min assist       General bed mobility comments: cues for sequence with assist to elevate trunk from surface with increased time and assist for anterior translation of trunk once sitting  Transfers Overall transfer level: Needs assistance   Transfers: Stand Pivot Transfers Sit to Stand: Min assist Stand pivot transfers: Min assist       General transfer comment: cues for hand placement, anterior translation with assist for elevation from surface and maintained anterior translation. Pt pulls up on RW despite cues with 2 trials from bed adn  [facility]  Ambulation/Gait Ambulation/Gait assistance: Min assist Ambulation Distance (Feet): 37 Feet Assistive device: Rolling walker (2 wheeled) Gait Pattern/deviations: Shuffle;Trunk flexed   Gait velocity interpretation: Below normal speed for age/gender General Gait Details: cues for posture and position in RW, very short shuffle steps that decrease with faitgue, chair pulled to pt as incontinent of BM   Stairs            Wheelchair Mobility    Modified Rankin (Stroke Patients Only)       Balance Overall balance assessment: Needs assistance   Sitting balance-Leahy Scale: Fair       Standing balance-Leahy Scale: Poor                      Cognition Arousal/Alertness: Awake/alert Behavior During Therapy: WFL for tasks assessed/performed Overall Cognitive Status: Impaired/Different from baseline Area of Impairment: Problem solving;Safety/judgement             Problem Solving: Slow processing General Comments: fear of falling and difficult to achieve anterior translation, unaware of incontinent BM    Exercises General Exercises - Lower Extremity Long Arc Quad: AROM;Seated;Both;15 reps    General Comments        Pertinent Vitals/Pain Pain Assessment: No/denies pain    Home Living                      Prior Function            PT Goals (current goals can now be found in the care plan section) Progress towards PT goals: Progressing toward goals (slowly)    Frequency  PT Plan Current plan remains appropriate    Co-evaluation             End of Session Equipment Utilized During Treatment: Gait belt Activity Tolerance: Patient limited by fatigue Patient left: in chair;with call bell/phone within reach;with chair alarm set     Time: 4098-1191 PT Time Calculation (min) (ACUTE ONLY): 32 min  Charges:  $Gait Training: 8-22 mins $Therapeutic Activity: 8-22 mins                    G Codes:      [name] 01/01/2015, 9:36 AM [name], PT [phone]

## 2015-01-01 NOTE — Progress Notes (Signed)
Medicare Important Message given? YES (If response is "NO", the following Medicare IM given date fields will be blank) Date Medicare IM given:01/01/2015 Medicare IM given by: Cher Egnor 

## 2015-01-01 NOTE — Care Management Note (Signed)
    Page 1 of 1   01/01/2015     4:42:24 PM CARE MANAGEMENT NOTE 01/01/2015  Patient:  [name],[name]   Account Number:  [id number]  Date Initiated:  12/29/2014  Documentation initiated by:  Gae GallopOLE,ANGELA  Subjective/Objective Assessment:   From home alone admitted with dyspnea     Action/Plan:   Return to home when medically stable. NCM to follow for potential d/c needs   Anticipated DC Date:  12/31/2014   Anticipated DC Plan:  HOME W HOME HEALTH SERVICES         Choice offered to / List presented to:             Status of service:  In process, will continue to follow Medicare Important Message given?  YES (If response is "NO", the following Medicare IM given date fields will be blank) Date Medicare IM given:  12/29/2014 Medicare IM given by:  Gae GallopOLE,ANGELA Date Additional Medicare IM given:  01/01/2015 Additional Medicare IM given by:  Gae GallopANGELA COLE  Discharge Disposition:    Per UR Regulation:  Reviewed for med. necessity/level of care/duration of stay  If discussed at Long Length of Stay Meetings, dates discussed:   12/31/2014    Comments:  01/01/15- 1630- Donn PieriniKristi Araya Roel RN, BSN [phone] Referral received for potential CIR- per PT notes from today pt [facility]. [facility] spoke with pt and daughter at bedside- pt has 24/7 caregivers in the home that they pay out of pocket for. Pt has had HH in the past with multiple HH agencies. Pt also has been to CIR before- daughter has concerns with pt going home if she can not walk that far. (ambulated 37 ft per PT note) Pt states that she is not interested in going to STSNF/rehab she wants to return [facility] Pt and family to continue to talk about rehab option and what is going to be safest plan for pt- will need PT to see pt again to help assist in most appropriate plan HH vs SNF vs CIR.

## 2015-01-01 NOTE — Progress Notes (Signed)
SUBJECTIVE:   No complaints  OBJECTIVE:   Vitals:   Filed Vitals:   12/31/14 0835 12/31/14 1518 12/31/14 1942 01/01/15 0500  BP: 110/73 100/65 109/64 90/55  Pulse: 100 98 100 94  Temp:  97.5 F (36.4 C) 97.7 F (36.5 C) 97.5 F (36.4 C)  TempSrc:  Oral Oral Oral  Resp:  19 18 18   Height:      Weight:    158 lb 8.2 oz (71.9 kg)  SpO2:  93% 98% 100%   I&O's:   Intake/Output Summary (Last 24 hours) at 01/01/15 0733 Last data filed at 01/01/15 0500  Gross per 24 hour  Intake    483 ml  Output    205 ml  Net    278 ml   TELEMETRY: Reviewed telemetry pt in atrial fibrillation     PHYSICAL EXAM General: Well developed, well nourished, in no acute distress Head: Eyes PERRLA, No xanthomas.   Normal cephalic and atramatic  Lungs:   Clear bilaterally to auscultation and percussion. Heart:   Irregularly irregular S1 S2 Pulses are 2+ & equal. Abdomen: Bowel sounds are positive, abdomen soft and non-tender without masses  Extremities:   No clubbing, cyanosis or edema.  DP +1 Neuro: Alert and oriented X 3. Psych:  Good affect, responds appropriately   LABS: Basic Metabolic Panel:  Recent Labs  29/56/2102/01/12 0345 01/01/15 0345  NA 132* 132*  K 5.1 4.0  CL 97 99  CO2 24 27  GLUCOSE 116* 118*  BUN 49* 46*  CREATININE 1.31* 1.38*  CALCIUM 8.3* 8.3*   Liver Function Tests: No results for input(s): AST, ALT, ALKPHOS, BILITOT, PROT, ALBUMIN in the last 72 hours. No results for input(s): LIPASE, AMYLASE in the last 72 hours. CBC:  Recent Labs  12/31/14 0345 01/01/15 0345  WBC 9.1 10.9*  HGB 11.0* 10.3*  HCT 33.8* 31.3*  MCV 96.3 96.9  PLT 168 178   Cardiac Enzymes: No results for input(s): CKTOTAL, CKMB, CKMBINDEX, TROPONINI in the last 72 hours. BNP: Invalid input(s): POCBNP D-Dimer: No results for input(s): DDIMER in the last 72 hours. Hemoglobin A1C: No results for input(s): HGBA1C in the last 72 hours. Fasting Lipid Panel: No results for input(s): CHOL,  HDL, LDLCALC, TRIG, CHOLHDL, LDLDIRECT in the last 72 hours. Thyroid Function Tests: No results for input(s): TSH, T4TOTAL, T3FREE, THYROIDAB in the last 72 hours.  Invalid input(s): FREET3 Anemia Panel: No results for input(s): VITAMINB12, FOLATE, FERRITIN, TIBC, IRON, RETICCTPCT in the last 72 hours. Coag Panel:   Lab Results  Component Value Date   INR 1.13 10/20/2014   INR 1.30 04/08/2013   INR 1.43 05/29/2012    RADIOLOGY: Dg Chest 2 View  12/31/2014   CLINICAL DATA:  CHF. Short of breath. Chronic back pain. Initial encounter.  EXAM: CHEST  2 VIEW  COMPARISON:  12/28/2014.  FINDINGS: Cardiomegaly is present. There is pulmonary vascular congestion. Basilar airspace disease is present. Probable small bilateral pleural effusions. Monitoring leads project over the chest. Aortic arch tortuosity and atherosclerosis.  IMPRESSION: Findings compatible with mild CHF.   Electronically Signed   By: Andreas NewportGeoffrey  Lamke M.D.   On: 12/31/2014 13:43   Dg Chest 2 View  12/28/2014   CLINICAL DATA:  Atrial fibrillation, peripheral arterial disease, hypertension, CHF  EXAM: CHEST  2 VIEW  COMPARISON:  12/26/2014  FINDINGS: Mild bilateral interstitial thickening. No pleural effusion, focal consolidation or pneumothorax. Stable cardiomegaly. Thoracic aortic atherosclerosis. Prominence of the central pulmonary vasculature.  No acute  osseous abnormality.  IMPRESSION: Cardiomegaly with mild interstitial edema.   Electronically Signed   By: Elige Ko   On: 12/28/2014 12:34   Dg Chest Port 1 View  12/26/2014   CLINICAL DATA:  Weakness and respiratory distress.  EXAM: PORTABLE CHEST - 1 VIEW  COMPARISON:  5 hours prior.  FINDINGS: Stable cardiomegaly and mitral annulus calcifications. Slight decrease in vascular congestion. Minimal left basilar subsegmental atelectasis. No consolidation to suggest pneumonia. No large pleural effusion or pneumothorax.  IMPRESSION: Stable cardiomegaly.  Decreased vascular congestion.    Electronically Signed   By: Rubye Oaks M.D.   On: 12/26/2014 23:02   Dg Chest Port 1 View  12/26/2014   CLINICAL DATA:  Shortness of breath  EXAM: PORTABLE CHEST - 1 VIEW  COMPARISON:  02/10/2014  FINDINGS: There is cardiomegaly. Mild vascular congestion. No overt edema. No confluent opacities or effusions. No acute bony abnormality. Dense mitral valve annular calcifications again noted.  IMPRESSION: Cardiomegaly, vascular congestion.   Electronically Signed   By: Charlett Nose M.D.   On: 12/26/2014 18:08   Mm Digital Diagnostic Unilat L  12/11/2014   CLINICAL DATA:  Six month followup evaluation of a benign concordant left breast ultrasound-guided core biopsy. Patient is currently wheelchair-bound and unable to stand for the examination.  EXAM: DIGITAL DIAGNOSTIC left breast MAMMOGRAM WITH CAD  COMPARISON:  06/05/2014, 05/22/2014, 05/16/2013.  ACR Breast Density Category b: There are scattered areas of fibroglandular density.  FINDINGS: The small focal density located medially within the left breast has decreased and is compatible with evolving fat necrosis. There are no findings worrisome for developing malignancy within the left breast. Limited evaluation in the left MLO projection due to the patient's physical status.  Mammographic images were processed with CAD.  IMPRESSION: No findings worrisome for developing malignancy. If felt to be clinically indicated given the patient's medical status, recommend bilateral screening mammography in July, 2016.  RECOMMENDATION: Screening mammography in July, 2016 if felt to be clinically indicated due to the patient's age and medical status.  I have discussed the findings and recommendations with the patient. Results were also provided in writing at the conclusion of the visit. If applicable, a reminder letter will be sent to the patient regarding the next appointment.  BI-RADS CATEGORY  2: Benign.   Electronically Signed   By: Rolla Plate M.D.   On:  12/11/2014 10:56   ASSESSMENT/PLAN:  1. Acute on chronic diastolic CHF - had 6lb weight gain over 4 days. Down 4 pounds from yesterday. No CP. BNP elevated at 1339 and vascular congestion on chest xray.Repeat echo results above.Her weight is above previous dry weights, and CVP pressure was 15.  Repeat chest xray 03/02 with mild pulmonary congestion. Lungs are clear today and no LE edema. She is 1L net neg and weight is now down. BNP trending down slightly. Her lungs are clear this am with no LE edema.   Continue Lasix  IV TID today and continue to follow renal function closely.  May be able to switch to PO Lasix tomorrow.  2. Chronic atrial fibrillation/flutter with rate improved. Cannot increase BB further due to soft BP. Continue renally dosed Apixaban/BB. Amio  BID added for better rate control. She had been on this in the past but due to controlled rates it was stopped.   3. Mild AS - repeat 2D echo with at least moderate AS - I discussed the findings of the echo with Saul and the natural history of  AS/MS and MR. This is probably contributing to her SOB and CHF. She is not interested in any surgical intervention if further testing with TEE would indicate severe AS or MS/MR so will not proceed with further workup and continue to treat medically.   4. Moderate MS/MR with moderate pulmonary HTN with PASP  5. OSA on CPAP- per IM 6. Severe PVD - per IM 7. Type II DM - per IM 8. HTN - controlled on BB 9. Hypokalemia - repleted 10. Elevated Troponin - minimally elevated with flat trend most likely secondary to demand ischemia with acute CHF.  11. Dementia - per IM 12. Hyponatremia secondary to CHF- resolved 13. UTI on antibiotics     Quintella Reichert, MD  01/01/2015  7:33 AM

## 2015-01-02 DIAGNOSIS — R230 Cyanosis: Secondary | ICD-10-CM

## 2015-01-02 LAB — CBC
HCT: 33.3 % — ABNORMAL LOW (ref 36.0–46.0)
HEMOGLOBIN: 10.7 g/dL — AB (ref 12.0–15.0)
MCH: 30.9 pg (ref 26.0–34.0)
MCHC: 32.1 g/dL (ref 30.0–36.0)
MCV: 96.2 fL (ref 78.0–100.0)
PLATELETS: 184 10*3/uL (ref 150–400)
RBC: 3.46 MIL/uL — AB (ref 3.87–5.11)
RDW: 17 % — ABNORMAL HIGH (ref 11.5–15.5)
WBC: 11.1 10*3/uL — AB (ref 4.0–10.5)

## 2015-01-02 LAB — BASIC METABOLIC PANEL
Anion gap: 8 (ref 5–15)
BUN: 40 mg/dL — ABNORMAL HIGH (ref 6–23)
CO2: 30 mmol/L (ref 19–32)
Calcium: 8.5 mg/dL (ref 8.4–10.5)
Chloride: 95 mmol/L — ABNORMAL LOW (ref 96–112)
Creatinine, Ser: 1.27 mg/dL — ABNORMAL HIGH (ref 0.50–1.10)
GFR calc non Af Amer: 37 mL/min — ABNORMAL LOW (ref >90)
GFR, EST AFRICAN AMERICAN: 42 mL/min — AB (ref >90)
Glucose, Bld: 104 mg/dL — ABNORMAL HIGH (ref 70–99)
POTASSIUM: 3.7 mmol/L (ref 3.5–5.1)
SODIUM: 133 mmol/L — AB (ref 135–145)

## 2015-01-02 LAB — GLUCOSE, CAPILLARY
GLUCOSE-CAPILLARY: 153 mg/dL — AB (ref 70–99)
Glucose-Capillary: 104 mg/dL — ABNORMAL HIGH (ref 70–99)
Glucose-Capillary: 155 mg/dL — ABNORMAL HIGH (ref 70–99)
Glucose-Capillary: 121 mg/dL — ABNORMAL HIGH (ref 70–99)

## 2015-01-02 MED ORDER — FUROSEMIDE 10 MG/ML IJ SOLN
80.0000 mg | Freq: Two times a day (BID) | INTRAMUSCULAR | Status: DC
  Administered 2015-01-02: 80 mg via INTRAVENOUS
  Filled 2015-01-02 (×2): qty 8

## 2015-01-02 MED ORDER — LEVOTHYROXINE SODIUM 112 MCG PO TABS
112.0000 ug | ORAL_TABLET | Freq: Every day | ORAL | Status: DC
  Administered 2015-01-02 – 2015-01-04 (×3): 112 ug via ORAL
  Filled 2015-01-02 (×3): qty 1

## 2015-01-02 NOTE — Progress Notes (Signed)
[facility] PROGRESS NOTE  [name] YNW:[id number] DOB: 1924-09-19 DOA: 12/26/2014 PCP: Judith OttoSTONEKING,HAL THOMAS, MD  Assessment/Plan: 81 y/o female with PMH of HTN, DM, CKD, OSA/CPAP, Diastolic CHF, mild Aortic Stenosis, A fib (on Eliquis) presented with generalized weakness, fatigue, chills, with sujective fever; patient also reported weight gain, DOE.  -patient is admitted with UTI, CHF with mild pulmonary edema, hyperkalemia, AKI   1. Acute on chronic CHF, diastolic HF, pulmonary HTN; probable CHF worse due to AS/MS with PAF; initially CXR showed mild edema, which has improved after diuresis/BiPAP;  Lasix change to 80 mg VI BID.  will cont BB, not on ACE due to AKI; monitor renal function   2. Mild acute hypoxic respiratory failure on admission likely due to CHF; hypoxia resolving on diuresis/BiPAP at night for OSA; cont as above   3. UTI started on IV atx, prelim urine cultures: E coli, sensitive to ceftriaxone and cefazolin. Will change antibiotics to keflex. Day 7-7 of antibiotics.  WBC trending up.   4. Initial Hyperkalemia on admission; resolved.   5. PAF on Eliquis; cont BB; added amio per cards;  AS/MS: per cardiology, patient/family discussions: patient is not interested in surgical intervention;  6. Two chronic wounds on right foot. Patietn with PAD. Followed by podiatrist, [facility] Donzetta [facility]Edgerton [facility]. Next appointment on Monday, 01/04/15; appreciate wound care evaluation. Continue with wound care.   7. DM diet controlled; cont ISS while inpatient  8-hand with cyanosis: patient notice to have bluish discolorations of hands, her right hand was cold. Pulse difficult to palpate. Dr Arnette FeltsBhrabam evaluated patient, she had good pulse by doppler. No need for arteriogram. Monitor for now.  9-leukocytosis; monitor. Might need to repeat ua.     Code Status: DNR Family Communication: d/w daughter Disposition Plan: home pend clinical improvement;  Echo  Impressions:  - The patient was in atrial fibrillation. Normal LV size with mild LV hypertrophy. Moderately dilated RV with mildly decreased systolic function. The aortic valve was severely calcified. Mean gradient was only 15 mmHg suggesting mild AS but calculated valve area 0.73 cm^2, suggesting severe. Visually at least moderate aortic stenosis. This may need further characterization by TEE. The mitral valve was heavily calcified. There appeared to be moderate mitral stenosis with mean gradient 8 mmHg. There was at least moderate MR but shadowing from calcified valve makes this difficult to fully evaluate. Severe TR. Moderate pulmonary hypertension. Dilated IVC. Overall, TEE may be helpful to assess the valves more closely.  Consultants:  Cardiology   Procedures:  Echo   Antibiotics:  Ceftriaxone 2/27>>>   (indicate start date, and stop date if known)  HPI/Subjective: Sitting in the chair. No complaints.   Objective: Filed Vitals:   01/02/15 0445  BP: 104/69  Pulse: 100  Temp: 97.6 F (36.4 C)  Resp: 18    Intake/Output Summary (Last 24 hours) at 01/02/15 1331 Last data filed at 01/02/15 0450  Gross per 24 hour  Intake    120 ml  Output    902 ml  Net   -782 ml   Filed Weights   12/31/14 0518 01/01/15 0500 01/02/15 0445  Weight: 73.9 kg (162 lb 14.7 oz) 71.9 kg (158 lb 8.2 oz) 73.8 kg (162 lb 11.2 oz)    Exam:   General:  Alert, oriented   Cardiovascular: s1,s2 irregular   Respiratory: LL crackles   Abdomen: soft, nt,nd   Musculoskeletal: no mild leg edema    Data Reviewed: Basic Metabolic Panel:  Recent  Labs Lab 12/29/14 0436 12/30/14 0446 12/31/14 0345 01/01/15 0345 01/02/15 0821  NA 135 133* 132* 132* 133*  K 4.7 4.2 5.1 4.0 3.7  CL 97 98 97 99 95*  CO2 GLUCOSE 150* 110* 116* 118* 104*  BUN 48* 45* 49* 46* 40*  CREATININE 1.36* 1.14* 1.31* 1.38* 1.27*  CALCIUM 8.5 8.3* 8.3* 8.3* 8.5    Liver Function Tests:  Recent Labs Lab 12/26/14 1800 12/27/14 1013  AST 35 33  ALT 22 21  ALKPHOS 110 96  BILITOT 0.9 0.7  PROT 7.2 5.9*  ALBUMIN 3.3* 2.7*   No results for input(s): LIPASE, AMYLASE in the last 168 hours. No results for input(s): AMMONIA in the last 168 hours. CBC:  Recent Labs Lab 12/26/14 1800 12/27/14 1013 12/29/14 0436 12/31/14 0345 01/01/15 0345 01/02/15 0821  WBC 11.3* 16.7* 12.3* 9.1 10.9* 11.1*  NEUTROABS 8.6*  --   --   --   --   --   HGB 12.7 10.7* 10.8* 11.0* 10.3* 10.7*  HCT 38.0 33.0* 33.9* 33.8* 31.3* 33.3*  MCV 97.9 97.6 100.9* 96.3 96.9 96.2  PLT 202 171 158 168 178 184   Cardiac Enzymes:  Recent Labs Lab 12/26/14 1800 12/27/14 0109 12/27/14 1013  TROPONINI 0.09* 0.09* 0.12*   BNP (last 3 results)  Recent Labs  12/26/14 1800 12/27/14 0344 12/30/14 0924  BNP 1404.5* 1339.0* 1176.8*    ProBNP (last 3 results)  Recent Labs  02/03/14 2158 02/12/14 1421 07/22/14 1213  PROBNP 2950.0* 1542.0* 1209.0*    CBG:  Recent Labs Lab 01/01/15 1126 01/01/15 1623 01/01/15 2108 01/02/15 0602 01/02/15 1146  GLUCAP 179* 163* 137* 104* 155*    Recent Results (from the past 240 hour(s))  Urine culture     Status: None   Collection Time: 12/26/14  7:14 PM  Result Value Ref Range Status   Specimen Description URINE, CLEAN CATCH  Final   Special Requests ADDED 1610 12/27/14  Final   Colony Count   Final    >=100,000 COLONIES/ML Performed at [facility]    Culture   Final    ESCHERICHIA COLI Performed at [facility]    Report Status 12/29/2014 FINAL  Final   Organism ID, Bacteria ESCHERICHIA COLI  Final      Susceptibility   Escherichia coli - MIC*    AMPICILLIN >=32 RESISTANT Resistant     CEFAZOLIN <=4 SENSITIVE Sensitive     CEFTRIAXONE <=1 SENSITIVE Sensitive     CIPROFLOXACIN >=4 RESISTANT Resistant     GENTAMICIN <=1 SENSITIVE Sensitive     LEVOFLOXACIN >=8 RESISTANT Resistant      NITROFURANTOIN <=16 SENSITIVE Sensitive     TOBRAMYCIN <=1 SENSITIVE Sensitive     TRIMETH/SULFA <=20 SENSITIVE Sensitive     PIP/TAZO <=4 SENSITIVE Sensitive     * ESCHERICHIA COLI     Studies: Dg Chest 2 View  12/31/2014   CLINICAL DATA:  CHF. Short of breath. Chronic back pain. Initial encounter.  EXAM: CHEST  2 VIEW  COMPARISON:  12/28/2014.  FINDINGS: Cardiomegaly is present. There is pulmonary vascular congestion. Basilar airspace disease is present. Probable small bilateral pleural effusions. Monitoring leads project over the chest. Aortic arch tortuosity and atherosclerosis.  IMPRESSION: Findings compatible with mild CHF.   Electronically Signed   By: Andreas Newport M.D.   On: 12/31/2014 13:43    Scheduled Meds: . allopurinol  300 mg Oral Daily  . amiodarone  200 mg  Oral BID  . apixaban  2.5 mg Oral BID  . atorvastatin  10 mg Oral QHS  . cadexomer iodine  1 application Topical QODAY  . calcium-vitamin D  1 tablet Oral Daily  . cephALEXin  500 mg Oral Q12H  . furosemide  80 mg Intravenous BID  . gabapentin  300 mg Oral QHS  . insulin aspart  0-9 Units Subcutaneous TID WC  . [START ON 01/03/2015] levothyroxine  112 mcg Oral QAC breakfast  . metoprolol succinate  12.5 mg Oral Daily  . multivitamin with minerals  1 tablet Oral Daily  . silver sulfADIAZINE   Topical Daily  . sodium chloride  3 mL Intravenous Q12H  . sodium chloride  3 mL Intravenous Q12H   Continuous Infusions:   Principal Problem:   Diastolic CHF, acute on chronic Active Problems:   Acute kidney injury   UTI (lower urinary tract infection)   Hyperkalemia   Elevated troponin I level   SOB (shortness of breath)   Acute renal failure syndrome    Time spent: >35 minutes     Kashawn Manzano A  [facility] Pager [phone]. If 7PM-7AM, please contact night-coverage at [email or web address], password [facility] 01/02/2015, 1:31 PM  LOS: 7 days

## 2015-01-02 NOTE — Progress Notes (Signed)
SUBJECTIVE:   No complaints  OBJECTIVE:   Vitals:   Filed Vitals:   01/01/15 1126 01/01/15 1441 01/01/15 1950 01/02/15 0445  BP: 96/69 101/61 107/57 104/69  Pulse: 98 98 97 100  Temp:  98.8 F (37.1 C) 97.7 F (36.5 C) 97.6 F (36.4 C)  TempSrc:  Oral Oral Oral  Resp:  16 18 18   Height:      Weight:    162 lb 11.2 oz (73.8 kg)  SpO2:  100% 100% 96%   I&O's:    Intake/Output Summary (Last 24 hours) at 01/02/15 1145 Last data filed at 01/02/15 0450  Gross per 24 hour  Intake    240 ml  Output    904 ml  Net   -664 ml   TELEMETRY: Reviewed telemetry pt in atrial fibrillation     PHYSICAL EXAM General: Well developed, well nourished, in no acute distress Head: Eyes PERRLA, No xanthomas.   Normal cephalic and atramatic  Lungs:   Clear bilaterally to auscultation and percussion. Heart:   Irregularly irregular S1 S2 Pulses are 2+ & equal. Abdomen: Bowel sounds are positive, abdomen soft and non-tender without masses  Extremities:   No clubbing, cyanosis or edema.  DP +1 Neuro: Alert and oriented X 3. Psych:  Good affect, responds appropriately   LABS: Basic Metabolic Panel:  Recent Labs  16/07/9602/04/16 0345 01/02/15 0821  NA 132* 133*  K 4.0 3.7  CL 99 95*  CO2 27 30  GLUCOSE 118* 104*  BUN 46* 40*  CREATININE 1.38* 1.27*  CALCIUM 8.3* 8.5   CBC:  Recent Labs  01/01/15 0345 01/02/15 0821  WBC 10.9* 11.1*  HGB 10.3* 10.7*  HCT 31.3* 33.3*  MCV 96.9 96.2  PLT 178 184   Coag Panel:   Lab Results  Component Value Date   INR 1.13 10/20/2014   INR 1.30 04/08/2013   INR 1.43 05/29/2012    RADIOLOGY: Dg Chest 2 View  12/31/2014   CLINICAL DATA:  CHF. Short of breath. Chronic back pain. Initial encounter.  EXAM: CHEST  2 VIEW  COMPARISON:  12/28/2014.  FINDINGS: Cardiomegaly is present. There is pulmonary vascular congestion. Basilar airspace disease is present. Probable small bilateral pleural effusions. Monitoring leads project over the chest. Aortic  arch tortuosity and atherosclerosis.  IMPRESSION: Findings compatible with mild CHF.   Electronically Signed   By: Andreas NewportGeoffrey  Lamke M.D.   On: 12/31/2014 13:43   Dg Chest 2 View  12/28/2014   CLINICAL DATA:  Atrial fibrillation, peripheral arterial disease, hypertension, CHF  EXAM: CHEST  2 VIEW  COMPARISON:  12/26/2014  FINDINGS: Mild bilateral interstitial thickening. No pleural effusion, focal consolidation or pneumothorax. Stable cardiomegaly. Thoracic aortic atherosclerosis. Prominence of the central pulmonary vasculature.  No acute osseous abnormality.  IMPRESSION: Cardiomegaly with mild interstitial edema.   Electronically Signed   By: Elige KoHetal  Patel   On: 12/28/2014 12:34   Dg Chest Port 1 View  12/26/2014   CLINICAL DATA:  Weakness and respiratory distress.  EXAM: PORTABLE CHEST - 1 VIEW  COMPARISON:  5 hours prior.  FINDINGS: Stable cardiomegaly and mitral annulus calcifications. Slight decrease in vascular congestion. Minimal left basilar subsegmental atelectasis. No consolidation to suggest pneumonia. No large pleural effusion or pneumothorax.  IMPRESSION: Stable cardiomegaly.  Decreased vascular congestion.   Electronically Signed   By: Rubye OaksMelanie  Ehinger M.D.   On: 12/26/2014 23:02   Dg Chest Port 1 View  12/26/2014   CLINICAL DATA:  Shortness of breath  EXAM: PORTABLE CHEST - 1 VIEW  COMPARISON:  02/10/2014  FINDINGS: There is cardiomegaly. Mild vascular congestion. No overt edema. No confluent opacities or effusions. No acute bony abnormality. Dense mitral valve annular calcifications again noted.  IMPRESSION: Cardiomegaly, vascular congestion.   Electronically Signed   By: Charlett Nose M.D.   On: 12/26/2014 18:08   Mm Digital Diagnostic Unilat L  12/11/2014   CLINICAL DATA:  Six month followup evaluation of a benign concordant left breast ultrasound-guided core biopsy. Patient is currently wheelchair-bound and unable to stand for the examination.  EXAM: DIGITAL DIAGNOSTIC left breast  MAMMOGRAM WITH CAD  COMPARISON:  06/05/2014, 05/22/2014, 05/16/2013.  ACR Breast Density Category b: There are scattered areas of fibroglandular density.  FINDINGS: The small focal density located medially within the left breast has decreased and is compatible with evolving fat necrosis. There are no findings worrisome for developing malignancy within the left breast. Limited evaluation in the left MLO projection due to the patient's physical status.  Mammographic images were processed with CAD.  IMPRESSION: No findings worrisome for developing malignancy. If felt to be clinically indicated given the patient's medical status, recommend bilateral screening mammography in July, 2016.  RECOMMENDATION: Screening mammography in July, 2016 if felt to be clinically indicated due to the patient's age and medical status.  I have discussed the findings and recommendations with the patient. Results were also provided in writing at the conclusion of the visit. If applicable, a reminder letter will be sent to the patient regarding the next appointment.  BI-RADS CATEGORY  2: Benign.   Electronically Signed   By: Rolla Plate M.D.   On: 12/11/2014 10:56   ASSESSMENT/PLAN:  1. Acute on chronic diastolic CHF - still mildly volume overloaded. Change Lasix to 80 mg IV twice a day. Follow renal function closely.  2. Chronic atrial fibrillation/flutter-rate is reasonably well controlled. Continue amiodarone and metoprolol. Continue apixaban.  3. AS - repeat 2D echo with at least moderate AS - She is not interested in any surgical intervention or further testing; continue to treat medically.   4. Moderate MS/MR with moderate pulmonary HTN with PASP  5. OSA on CPAP- per IM 6. Severe PVD - per IM 7. Type II DM - per IM 8. HTN - controlled on BB 9. Hypokalemia - repleted 10. Elevated Troponin -no further eval 11. Dementia - per IM 12. Hyponatremia secondary to CHF 13. UTI on  antibiotics     Olga Millers, MD  01/02/2015  11:45 AM

## 2015-01-02 NOTE — Consult Note (Signed)
Consult Note  Patient name: [name] MRN: [id number] DOB: 1924/02/18 Sex: female  Consulting Physician:  Hospitalist service  Reason for Consult:  Chief Complaint  Patient presents with  . Weakness    HISTORY OF PRESENT ILLNESS: This is an 81 year old female that I was asked to evaluate for bluish discoloration on both of her hands.  The hands have been noticed to be discolored by her daughter.  The patient does not endorse any pain or motor dysfunction.  She states that they did feel cold.  The patient has multiple comorbidities including an extensive cardiac history with chronic heart failure and atrial fibrillation for which she is anticoagulated.  She was admitted with progressive weakness and a 6 pound weight gain.  She also had an increase in oxygen requirement.  She is medically managed for hypercholesterolemia with managed for hypercholesterolemia with a statin.  She is a diabetic.  Her last hemoglobin A1c was 6.2   Past Medical History  Diagnosis Date  . Hypertension   . Peripheral neuropathy   . Asthma   . High cholesterol   . Diabetes with neurologic complications   . PAD (peripheral artery disease)   . Enlarged heart   . Anginal pain 05/28/12  . Pneumonia 11/2010  . Sleep apnea     cpap  . Hypothyroidism   . Headache(784.0) 05/29/12    "recently"  . Stroke 05/2005; 08/2010    !mini"  . Personal history of gout   . Hypothyroidism   . DJD (degenerative joint disease)   . Carpal tunnel syndrome, bilateral   . Recurrent labyrinthitis   . GERD (gastroesophageal reflux disease)     S/P esophageal dilation in 1996  . Gallstone     Silent  . Fibrocystic breast changes   . Hemorrhoids     with bleeding  . Hx of adenomatous colonic polyps     Dr Sherin Quarry  . Diverticulitis     left side  . MR (mitral regurgitation)     moderate  . Mild anemia     hemoglobin 11.8 on 06/2009  . Edema of lower extremity     Chronic  . Ischemic colitis 8/13  .  Aortic stenosis, moderate   . Chronic diastolic CHF (congestive heart failure)   . Moderate mitral regurgitation   . CKD (chronic kidney disease) stage 3, GFR 30-59 ml/min     "something on labs always say she has some problems"  . Chronic atrial fibrillation   . Peripheral arterial disease     critical limb ischemia, right third toe    Past Surgical History  Procedure Laterality Date  . Tonsillectomy and adenoidectomy  1960's  . Dilation and curettage of uterus    . Vaginal hysterectomy  1970's  . Total knee arthroplasty  1970-~2002    left; right  . Cataract extraction w/ intraocular lens  implant, bilateral  ?1970's  . Kyphoplasty N/A     L2    History   Social History  . Marital Status: Widowed    Spouse Name: N/A  . Number of Children: 3  . Years of Education: 8th   Occupational History  . RETIRED     [employer]   Social History Main Topics  . Smoking status: Never Smoker   . Smokeless tobacco: Never Used  . Alcohol Use: No  . Drug Use: No  . Sexual Activity: No   Other Topics Concern  . Not on file  Social History Narrative   Retired. Lives alone. 3 adult children. No tobacco use. Nondrinker. No illicit drug use.    Caffeine Use: none    Family History  Problem Relation Age of Onset  . Heart disease Mother   . Heart disease Father   . Heart disease Brother     Allergies as of 12/26/2014 - Review Complete 12/26/2014  Allergen Reaction Noted  . Ambien [zolpidem tartrate] Other (See Comments) 02/05/2014  . Aspirin Other (See Comments) 05/29/2012  . Nexium [esomeprazole magnesium] Rash 05/29/2012  . Penicillins Rash and Other (See Comments) 05/29/2012    No current facility-administered medications on file prior to encounter.   Current Outpatient Prescriptions on File Prior to Encounter  Medication Sig Dispense Refill  . acetaminophen (TYLENOL) 500 MG tablet Take 500 mg by mouth at bedtime. Give with the Tramadol 50 mg    . allopurinol (ZYLOPRIM)  300 MG tablet Take 1 tablet (300 mg total) by mouth daily. 60 tablet 1  . apixaban (ELIQUIS) 2.5 MG TABS tablet Take 1 tablet (2.5 mg total) by mouth 2 (two) times daily. 60 tablet 0  . atorvastatin (LIPITOR) 10 MG tablet Take 1 tablet (10 mg total) by mouth daily. (Patient taking differently: Take 10 mg by mouth daily at 6 PM. ) 30 tablet 1  . Calcium Carbonate-Vitamin D (CALCIUM-VITAMIN D) 600-200 MG-UNIT CAPS Take 1 tablet by mouth daily.     . furosemide (LASIX) 40 MG tablet Take 40-80 mg by mouth 2 (two) times daily. Take 40 mg by mouth twice daily, alternating every other day with 80 mg by mouth in the morning and 40 mg by mouth in the evening.    . gabapentin (NEURONTIN) 300 MG capsule Take 1 capsule (300 mg total) by mouth at bedtime. 30 capsule 1  . ketoconazole (NIZORAL) 2 % cream     . levothyroxine (SYNTHROID, LEVOTHROID) 112 MCG tablet Take 112 mcg by mouth daily before breakfast.    . metoprolol succinate (TOPROL-XL) 25 MG 24 hr tablet Take 0.5 tablets (12.5 mg total) by mouth daily. 15 tablet 1  . Multiple Vitamins-Minerals (MULTIVITAMIN WITH MINERALS) tablet Take 1 tablet by mouth daily.    . potassium chloride (K-DUR,KLOR-CON) 10 MEQ tablet Take 2 tablets (20 mEq total) by mouth 2 (two) times daily. 360 tablet 3  . silver sulfADIAZINE (SILVADENE) 1 % cream Apply 1 application topically daily. (Patient taking differently: Apply 1 application topically every other day. ) 50 g 0  . ONE TOUCH ULTRA TEST test strip 1 each by Other route See admin instructions. Check blood sugar once daily.    . traMADol (ULTRAM) 50 MG tablet Take 50 mg by mouth every 8 (eight) hours as needed for moderate pain. Give with Tylenol 500 mg       REVIEW OF SYSTEMS: Cardiovascular: No chest pain, chest pressure, palpitations,  No claudication or rest pain,  No history of DVT or phlebitis. Pulmonary: increased oxygen requirement  Neurologic:  generalized weakness Hematologic: No bleeding problems or  clotting disorders. Musculoskeletal: No joint pain or joint swelling. Gastrointestinal: No blood in stool or hematemesis Genitourinary: No dysuria or hematuria. Psychiatric:: No history of major depression. Integumentary: No rashes or ulcers. Constitutional: No fever or chills.  PHYSICAL EXAMINATION: General: The patient appears their stated age.  Vital signs are BP 104/69 mmHg  Pulse 100  Temp(Src) 97.6 F (36.4 C) (Oral)  Resp 18  Ht 5\' 6"  (1.676 m)  Wt 162 lb 11.2 oz (73.8 kg)  BMI 26.27 kg/m2  SpO2 96% Pulmonary: Respirations are non-labored HEENT:  No gross abnormalities Abdomen: Soft and non-tender  Musculoskeletal: There are no major deformities.   Neurologic: No focal weakness or paresthesias are detected, Skin: There are no ulcer or rashes noted. Psychiatric: The patient has normal affect. Cardiovascular: Heart rate.  She has a faintly palpable left radial pulse.  I was able to get a Doppler signal in bilateral radial and ulnar arteries as well as bilateral palmar arches.    Diagnostic Studies: none    Assessment:  Bluish hand discoloration Plan: Audible Doppler signals through her Hallmark arches bilaterally.  She does not endorse pain or motor dysfunction.  I do not think that she is having an ischemic event to her hands.  I recommend keeping her hands covered in warm.  Hopefully the discoloration will improve as her medical issues resolve.  Please contact me for further questions.     Jorge Ny, M.D. [facility] Office: [phone] Pager:  [phone]

## 2015-01-03 LAB — BASIC METABOLIC PANEL
Anion gap: 9 (ref 5–15)
BUN: 45 mg/dL — ABNORMAL HIGH (ref 6–23)
CALCIUM: 8.1 mg/dL — AB (ref 8.4–10.5)
CO2: 24 mmol/L (ref 19–32)
CREATININE: 1.3 mg/dL — AB (ref 0.50–1.10)
Chloride: 95 mmol/L — ABNORMAL LOW (ref 96–112)
GFR calc Af Amer: 41 mL/min — ABNORMAL LOW (ref >90)
GFR calc non Af Amer: 36 mL/min — ABNORMAL LOW (ref >90)
GLUCOSE: 104 mg/dL — AB (ref 70–99)
Potassium: 3.6 mmol/L (ref 3.5–5.1)
Sodium: 128 mmol/L — ABNORMAL LOW (ref 135–145)

## 2015-01-03 LAB — GLUCOSE, CAPILLARY
GLUCOSE-CAPILLARY: 134 mg/dL — AB (ref 70–99)
Glucose-Capillary: 124 mg/dL — ABNORMAL HIGH (ref 70–99)
Glucose-Capillary: 180 mg/dL — ABNORMAL HIGH (ref 70–99)
Glucose-Capillary: 167 mg/dL — ABNORMAL HIGH (ref 70–99)

## 2015-01-03 LAB — CBC
HCT: 32.2 % — ABNORMAL LOW (ref 36.0–46.0)
Hemoglobin: 10.4 g/dL — ABNORMAL LOW (ref 12.0–15.0)
MCH: 31.1 pg (ref 26.0–34.0)
MCHC: 32.3 g/dL (ref 30.0–36.0)
MCV: 96.4 fL (ref 78.0–100.0)
Platelets: 177 10*3/uL (ref 150–400)
RBC: 3.34 MIL/uL — ABNORMAL LOW (ref 3.87–5.11)
RDW: 17 % — AB (ref 11.5–15.5)
WBC: 10.2 10*3/uL (ref 4.0–10.5)

## 2015-01-03 MED ORDER — FUROSEMIDE 80 MG PO TABS
80.0000 mg | ORAL_TABLET | Freq: Two times a day (BID) | ORAL | Status: DC
  Administered 2015-01-03 – 2015-01-04 (×3): 80 mg via ORAL
  Filled 2015-01-03 (×6): qty 1

## 2015-01-03 NOTE — Progress Notes (Signed)
[facility] PROGRESS NOTE  Moody BruinsClarice V Noa JXB:[id number] DOB: 1923-12-08 DOA: 12/26/2014 PCP: Ginette OttoSTONEKING,HAL THOMAS, MD  Assessment/Plan: 81 y/o female with PMH of HTN, DM, CKD, OSA/CPAP, Diastolic CHF, mild Aortic Stenosis, A fib (on Eliquis) presented with generalized weakness, fatigue, chills, with sujective fever; patient also reported weight gain, DOE.  -patient is admitted with UTI, CHF with mild pulmonary edema, hyperkalemia, AKI   1. Acute on chronic CHF, diastolic HF, pulmonary HTN; probable CHF worse due to AS/MS with PAF; initially CXR showed mild edema, which has improved after diuresis/BiPAP;  Lasix change to 80 mg PO BID.  will cont BB, not on ACE due to AKI; monitor renal function   2. Mild acute hypoxic respiratory failure on admission likely due to CHF; hypoxia resolving on diuresis/BiPAP at night for OSA; cont as above   3. UTI started on IV atx, prelim urine cultures: E coli, sensitive to ceftriaxone and cefazolin. Will change antibiotics to keflex. Day 7-7 of antibiotics.  WBC normalized.   4. Initial Hyperkalemia on admission; resolved.   5. PAF on Eliquis; cont BB; added amio per cards;  AS/MS: per cardiology, patient/family discussions: patient is not interested in surgical intervention;  6. Two chronic wounds on right foot. Patietn with PAD. Followed by podiatrist, Dr. Donzetta KohutEdgerton [facility]. Next appointment on Monday, 01/04/15; appreciate wound care evaluation. Continue with wound care.   7. DM diet controlled; cont ISS while inpatient  8-hand with cyanosis: patient notice to have bluish discolorations of hands, her right hand was cold. Pulse difficult to palpate. Dr Arnette FeltsBhrabam evaluated patient, she had good pulse by doppler. No need for arteriogram. Monitor for now.  9-Hyponatremia; related to HF. Repeat lab in am.     Code Status: DNR Family Communication: d/w daughter Disposition Plan: home pend clinical improvement;  Echo Impressions:  -  The patient was in atrial fibrillation. Normal LV size with mild LV hypertrophy. Moderately dilated RV with mildly decreased systolic function. The aortic valve was severely calcified. Mean gradient was only 15 mmHg suggesting mild AS but calculated valve area 0.73 cm^2, suggesting severe. Visually at least moderate aortic stenosis. This may need further characterization by TEE. The mitral valve was heavily calcified. There appeared to be moderate mitral stenosis with mean gradient 8 mmHg. There was at least moderate MR but shadowing from calcified valve makes this difficult to fully evaluate. Severe TR. Moderate pulmonary hypertension. Dilated IVC. Overall, TEE may be helpful to assess the valves more closely.  Consultants:  Cardiology   Procedures:  Echo   Antibiotics:  Ceftriaxone 2/27>>>   (indicate start date, and stop date if known)  HPI/Subjective: Sitting in the chair. She is tired today.  .   Objective: Filed Vitals:   01/03/15 0358  BP: 98/70  Pulse: 82  Temp: 97.5 F (36.4 C)  Resp: 18    Intake/Output Summary (Last 24 hours) at 01/03/15 1428 Last data filed at 01/03/15 1300  Gross per 24 hour  Intake    720 ml  Output      0 ml  Net    720 ml   Filed Weights   01/01/15 0500 01/02/15 0445 01/03/15 0358  Weight: 71.9 kg (158 lb 8.2 oz) 73.8 kg (162 lb 11.2 oz) 73.3 kg (161 lb 9.6 oz)    Exam:   General:  Alert, oriented   Cardiovascular: s1,s2 irregular   Respiratory: LL crackles   Abdomen: soft, nt,nd   Musculoskeletal: no mild leg edema  Data Reviewed: Basic Metabolic Panel:  Recent Labs Lab 12/30/14 0446 12/31/14 0345 01/01/15 0345 01/02/15 0821 01/03/15 0436  NA 133* 132* 132* 133* 128*  K 4.2 5.1 4.0 3.7 3.6  CL 98 97 99 95* 95*  CO2 GLUCOSE 110* 116* 118* 104* 104*  BUN 45* 49* 46* 40* 45*  CREATININE 1.14* 1.31* 1.38* 1.27* 1.30*  CALCIUM 8.3* 8.3* 8.3* 8.5 8.1*   Liver  Function Tests: No results for input(s): AST, ALT, ALKPHOS, BILITOT, PROT, ALBUMIN in the last 168 hours. No results for input(s): LIPASE, AMYLASE in the last 168 hours. No results for input(s): AMMONIA in the last 168 hours. CBC:  Recent Labs Lab 12/29/14 0436 12/31/14 0345 01/01/15 0345 01/02/15 0821 01/03/15 0436  WBC 12.3* 9.1 10.9* 11.1* 10.2  HGB 10.8* 11.0* 10.3* 10.7* 10.4*  HCT 33.9* 33.8* 31.3* 33.3* 32.2*  MCV 100.9* 96.3 96.9 96.2 96.4  PLT 158 168 178 184 177   Cardiac Enzymes: No results for input(s): CKTOTAL, CKMB, CKMBINDEX, TROPONINI in the last 168 hours. BNP (last 3 results)  Recent Labs  12/26/14 1800 12/27/14 0344 12/30/14 0924  BNP 1404.5* 1339.0* 1176.8*    ProBNP (last 3 results)  Recent Labs  02/03/14 2158 02/12/14 1421 07/22/14 1213  PROBNP 2950.0* 1542.0* 1209.0*    CBG:  Recent Labs Lab 01/02/15 1146 01/02/15 1700 01/02/15 2112 01/03/15 0557 01/03/15 1212  GLUCAP 155* 153* 121* 124* 134*    Recent Results (from the past 240 hour(s))  Urine culture     Status: None   Collection Time: 12/26/14  7:14 PM  Result Value Ref Range Status   Specimen Description URINE, CLEAN CATCH  Final   Special Requests ADDED [id number] 12/27/14  Final   Colony Count   Final    >=100,000 COLONIES/ML Performed at [facility]    Culture   Final    ESCHERICHIA COLI Performed at [facility]    Report Status 12/29/2014 FINAL  Final   Organism ID, Bacteria ESCHERICHIA COLI  Final      Susceptibility   Escherichia coli - MIC*    AMPICILLIN >=32 RESISTANT Resistant     CEFAZOLIN <=4 SENSITIVE Sensitive     CEFTRIAXONE <=1 SENSITIVE Sensitive     CIPROFLOXACIN >=4 RESISTANT Resistant     GENTAMICIN <=1 SENSITIVE Sensitive     LEVOFLOXACIN >=8 RESISTANT Resistant     NITROFURANTOIN <=16 SENSITIVE Sensitive     TOBRAMYCIN <=1 SENSITIVE Sensitive     TRIMETH/SULFA <=20 SENSITIVE Sensitive     PIP/TAZO <=4 SENSITIVE Sensitive     *  ESCHERICHIA COLI     Studies: No results found.  Scheduled Meds: . allopurinol  300 mg Oral Daily  . amiodarone  200 mg Oral BID  . apixaban  2.5 mg Oral BID  . atorvastatin  10 mg Oral QHS  . cadexomer iodine  1 application Topical QODAY  . calcium-vitamin D  1 tablet Oral Daily  . cephALEXin  500 mg Oral Q12H  . furosemide  80 mg Oral BID  . gabapentin  300 mg Oral QHS  . insulin aspart  0-9 Units Subcutaneous TID WC  . levothyroxine  112 mcg Oral QAC breakfast  . metoprolol succinate  12.5 mg Oral Daily  . multivitamin with minerals  1 tablet Oral Daily  . silver sulfADIAZINE   Topical Daily  . sodium chloride  3 mL Intravenous Q12H  . sodium chloride  3 mL Intravenous Q12H  Continuous Infusions:   Principal Problem:   Diastolic CHF, acute on chronic Active Problems:   Acute kidney injury   UTI (lower urinary tract infection)   Hyperkalemia   Elevated troponin I level   SOB (shortness of breath)   Acute renal failure syndrome    Time spent: >35 minutes     Shyanna Klingel A  [facility] Pager [phone]. If 7PM-7AM, please contact night-coverage at [email or web address], password [facility] 01/03/2015, 2:28 PM  LOS: 8 days

## 2015-01-03 NOTE — Progress Notes (Signed)
SUBJECTIVE:  Denies dyspnea or chest pain  OBJECTIVE:   Vitals:   Filed Vitals:   01/02/15 1353 01/02/15 2101 01/02/15 2108 01/03/15 0358  BP: 101/66 105/73  98/70  Pulse: 98 95 92 82  Temp: 97.9 F (36.6 C) 97.6 F (36.4 C)  97.5 F (36.4 C)  TempSrc: Oral Oral  Oral  Resp: Height:      Weight:    161 lb 9.6 oz (73.3 kg)  SpO2: 100% 95% 95% 95%   I&O's:    Intake/Output Summary (Last 24 hours) at 01/03/15 0816 Last data filed at 01/02/15 1700  Gross per 24 hour  Intake    480 ml  Output      0 ml  Net    480 ml       PHYSICAL EXAM General: Well developed, well nourished, in no acute distress Head: Normal  Lungs:   CTA Heart:   Irregular Abdomen:  soft and non-tender without masses  Extremities:   Trace to 1+edema.   Neuro: Alert and oriented X 3. Psych:  Good affect, responds appropriately   LABS: Basic Metabolic Panel:  Recent Labs  04/54/09 0821 01/03/15 0436  NA 133* 128*  K 3.7 3.6  CL 95* 95*  CO2 30 24  GLUCOSE 104* 104*  BUN 40* 45*  CREATININE 1.27* 1.30*  CALCIUM 8.5 8.1*   CBC:  Recent Labs  01/02/15 0821 01/03/15 0436  WBC 11.1* 10.2  HGB 10.7* 10.4*  HCT 33.3* 32.2*  MCV 96.2 96.4  PLT 184 177   Coag Panel:   Lab Results  Component Value Date   INR 1.13 10/20/2014   INR 1.30 04/08/2013   INR 1.43 05/29/2012    RADIOLOGY: Dg Chest 2 View  12/31/2014   CLINICAL DATA:  CHF. Short of breath. Chronic back pain. Initial encounter.  EXAM: CHEST  2 VIEW  COMPARISON:  12/28/2014.  FINDINGS: Cardiomegaly is present. There is pulmonary vascular congestion. Basilar airspace disease is present. Probable small bilateral pleural effusions. Monitoring leads project over the chest. Aortic arch tortuosity and atherosclerosis.  IMPRESSION: Findings compatible with mild CHF.   Electronically Signed   By: Andreas Newport M.D.   On: 12/31/2014 13:43   Dg Chest 2 View  12/28/2014   CLINICAL DATA:  Atrial fibrillation, peripheral  arterial disease, hypertension, CHF  EXAM: CHEST  2 VIEW  COMPARISON:  12/26/2014  FINDINGS: Mild bilateral interstitial thickening. No pleural effusion, focal consolidation or pneumothorax. Stable cardiomegaly. Thoracic aortic atherosclerosis. Prominence of the central pulmonary vasculature.  No acute osseous abnormality.  IMPRESSION: Cardiomegaly with mild interstitial edema.   Electronically Signed   By: Elige Ko   On: 12/28/2014 12:34   Dg Chest Port 1 View  12/26/2014   CLINICAL DATA:  Weakness and respiratory distress.  EXAM: PORTABLE CHEST - 1 VIEW  COMPARISON:  5 hours prior.  FINDINGS: Stable cardiomegaly and mitral annulus calcifications. Slight decrease in vascular congestion. Minimal left basilar subsegmental atelectasis. No consolidation to suggest pneumonia. No large pleural effusion or pneumothorax.  IMPRESSION: Stable cardiomegaly.  Decreased vascular congestion.   Electronically Signed   By: Rubye Oaks M.D.   On: 12/26/2014 23:02   Dg Chest Port 1 View  12/26/2014   CLINICAL DATA:  Shortness of breath  EXAM: PORTABLE CHEST - 1 VIEW  COMPARISON:  02/10/2014  FINDINGS: There is cardiomegaly. Mild vascular congestion. No overt edema. No confluent opacities or effusions. No acute bony abnormality. Dense  mitral valve annular calcifications again noted.  IMPRESSION: Cardiomegaly, vascular congestion.   Electronically Signed   By: Charlett NoseKevin  Dover M.D.   On: 12/26/2014 18:08   Mm Digital Diagnostic Unilat L  12/11/2014   CLINICAL DATA:  Six month followup evaluation of a benign concordant left breast ultrasound-guided core biopsy. Patient is currently wheelchair-bound and unable to stand for the examination.  EXAM: DIGITAL DIAGNOSTIC left breast MAMMOGRAM WITH CAD  COMPARISON:  06/05/2014, 05/22/2014, 05/16/2013.  ACR Breast Density Category b: There are scattered areas of fibroglandular density.  FINDINGS: The small focal density located medially within the left breast has decreased and is  compatible with evolving fat necrosis. There are no findings worrisome for developing malignancy within the left breast. Limited evaluation in the left MLO projection due to the patient's physical status.  Mammographic images were processed with CAD.  IMPRESSION: No findings worrisome for developing malignancy. If felt to be clinically indicated given the patient's medical status, recommend bilateral screening mammography in July, 2016.  RECOMMENDATION: Screening mammography in July, 2016 if felt to be clinically indicated due to the patient's age and medical status.  I have discussed the findings and recommendations with the patient. Results were also provided in writing at the conclusion of the visit. If applicable, a reminder letter will be sent to the patient regarding the next appointment.  BI-RADS CATEGORY  2: Benign.   Electronically Signed   By: Rolla Plateandolph  Jackson M.D.   On: 12/11/2014 10:56   ASSESSMENT/PLAN:  1. Acute on chronic diastolic CHF - patient symptomatically improved; change lasix to 80 mg po BID; follow NA and renal function. Would fluid restrict to 1.5 liters per day 2. Chronic atrial fibrillation/flutter-rate is reasonably well controlled. Continue amiodarone and metoprolol. Continue apixaban.  3. AS - repeat 2D echo with at least moderate AS - She is not interested in any surgical intervention or further testing; continue to treat medically.   4. Moderate MS/MR with moderate pulmonary HTN with PASP 61mmHg  5. OSA on CPAP- per IM 6. Severe PVD - per IM 7. Type II DM - per IM 8. HTN - controlled on BB 9. Hypokalemia - repleted 10. Elevated Troponin -no further eval 11. Dementia - per IM 12. Hyponatremia secondary to CHF-fluid restrict 13. [facility]    Olga MillersBrian Crenshaw, MD  01/03/2015  8:16 AM

## 2015-01-04 ENCOUNTER — Telehealth: Payer: Self-pay | Admitting: Physician Assistant

## 2015-01-04 LAB — BASIC METABOLIC PANEL
ANION GAP: 6 (ref 5–15)
BUN: 53 mg/dL — AB (ref 6–23)
CO2: 27 mmol/L (ref 19–32)
CREATININE: 1.58 mg/dL — AB (ref 0.50–1.10)
Calcium: 8.2 mg/dL — ABNORMAL LOW (ref 8.4–10.5)
Chloride: 95 mmol/L — ABNORMAL LOW (ref 96–112)
GFR, EST AFRICAN AMERICAN: 33 mL/min — AB (ref >90)
GFR, EST NON AFRICAN AMERICAN: 28 mL/min — AB (ref >90)
Glucose, Bld: 101 mg/dL — ABNORMAL HIGH (ref 70–99)
Potassium: 4.4 mmol/L (ref 3.5–5.1)
Sodium: 128 mmol/L — ABNORMAL LOW (ref 135–145)

## 2015-01-04 LAB — GLUCOSE, CAPILLARY
GLUCOSE-CAPILLARY: 106 mg/dL — AB (ref 70–99)
Glucose-Capillary: 144 mg/dL — ABNORMAL HIGH (ref 70–99)

## 2015-01-04 MED ORDER — AMIODARONE HCL 200 MG PO TABS: 200.0000 mg | ORAL_TABLET | Freq: Two times a day (BID) | ORAL | Status: AC

## 2015-01-04 MED ORDER — FUROSEMIDE 40 MG PO TABS
40.0000 mg | ORAL_TABLET | Freq: Two times a day (BID) | ORAL | Status: DC
  Filled 2015-01-04 (×2): qty 1

## 2015-01-04 MED ORDER — FUROSEMIDE 40 MG PO TABS: 40.0000 mg | ORAL_TABLET | Freq: Two times a day (BID) | ORAL | Status: DC

## 2015-01-04 NOTE — Progress Notes (Signed)
Physical Therapy Treatment Patient Details Name: [name] MRN: [id number] DOB: 1923-11-10 Today's Date: 01/04/2015    History of Present Illness [name] is a 81 y.o. woman with multiple comorbidities and an extensive cardiac history including chronic diastolic HF, mild aortic and mitral valve stenosis (last echo 12/2013), and atrial fibrillation (chronic anticoagulation with Eliquis) who was in her baseline state of health until 2/26. Pt with acute kidney injury, UTI, weakness and demand ischemia     PT Comments    Pt with decreased activity tolerance with gait and mobility today. Pt states she has not been getting up to [facility] and hasn't tried walking other than with P.T. Pt educated for transfers, RW use, HEP and again encouraged OOB to chair throughout the day and continued HEP. Will continue to follow.     Follow Up Recommendations  SNF;Home health PT;Supervision/Assistance - 24 hour (Pt would benefit from SNF but remains adamant that she wants to return home)     Equipment Recommendations  None recommended by PT    Recommendations for Other Services       Precautions / Restrictions Precautions Precautions: Fall Precaution Comments: incontinent Restrictions Weight Bearing Restrictions: No    Mobility  Bed Mobility               General bed mobility comments: in chair on arrival  Transfers Overall transfer level: Needs assistance   Transfers: Stand Pivot Transfers Sit to Stand: Min assist Stand pivot transfers: Min assist       General transfer comment: cues for hand placement, anterior translation with assist for elevation from surface with use of one hand on RW and one and surface. Sit to stand x 4 during session, pivot chair to BSC x 1 with pt having continued incontinence of BM and total assist for pericare  Ambulation/Gait Ambulation/Gait assistance: Min assist Ambulation Distance (Feet): 12 Feet (6', 12' with seated rest and chair pulled  to her x 2. ) Assistive device: Rolling walker (2 wheeled) Gait Pattern/deviations: Shuffle;Trunk flexed   Gait velocity interpretation: Below normal speed for age/gender General Gait Details: cues for posture and position in RW, very short shuffle steps that decrease with fatigue, chair pulled to pt  Decreased endurance from last session with max cues for stepping into RW. Pt limited by fear of falling   Stairs            Wheelchair Mobility    Modified Rankin (Stroke Patients Only)       Balance Overall balance assessment: Needs assistance   Sitting balance-Leahy Scale: Fair       Standing balance-Leahy Scale: Poor                      Cognition Arousal/Alertness: Awake/alert Behavior During Therapy: WFL for tasks assessed/performed             Safety/Judgement: Decreased awareness of safety;Decreased awareness of deficits   Problem Solving: Slow processing General Comments: fear of falling and difficult to achieve anterior translation, unaware of incontinent BM    Exercises General Exercises - Lower Extremity Long Arc Quad: AROM;Seated;Both;15 reps Hip Flexion/Marching: AROM;Seated;Both;15 reps    General Comments        Pertinent Vitals/Pain Pain Score: 5  Pain Location: chronic back pain Pain Descriptors / Indicators: Aching Pain Intervention(s): Repositioned    Home Living                      Prior  Function            PT Goals (current goals can now be found in the care plan section) Progress towards PT goals: Progressing toward goals    Frequency  Min 3X/week    PT Plan Current plan remains appropriate    Co-evaluation             End of Session Equipment Utilized During Treatment: Gait belt Activity Tolerance: Patient limited by fatigue Patient left: in chair;with call bell/phone within reach;with chair alarm set     Time: [id number] PT Time Calculation (min) (ACUTE ONLY): 25 min  Charges:  $Gait  Training: 8-22 mins $Therapeutic Activity: 8-22 mins                    G Codes:      Delorse Lekabor, Merina Behrendt Beth 01/04/2015, 11:37 AM Delaney MeigsMaija Tabor Aliena Ghrist, PT [phone]

## 2015-01-04 NOTE — Telephone Encounter (Signed)
    Ms Woessner's son called tonight to ask about her lasix dosing. [facility]  [facility]. He stated that she had LE edema and he was worried this wasn't enough. Her LE edema is unchanged from earlier this AM. She doesn't have any new or worsening sx since discharge and is feeling okay. I assured him that Dr. Judd GaudierSkain's likely chose appropriate lasix dosing considering her recent admission for A/C diastolic CHF, AKI, hyperkalemia, UTI and dehydration. I encouraged him to watch out for s/s CHF like SOB, orthopnea or PND and to follow up with Dr. Mayford Knifeurner as scheduled.    Cline CrockKathryn Thompson PA-C  [facility]

## 2015-01-04 NOTE — Progress Notes (Signed)
CARE MANAGEMENT NOTE 01/04/2015  Patient:  [name],[name]   Account Number:  [id number]  Date Initiated:  12/29/2014  Documentation initiated by:  COLE,ANGELA  Subjective/Objective Assessment:   From home alone admitted with dyspnea     Action/Plan:   Return to home when medically stable. NCM to follow for potential d/c needs   Anticipated DC Date:  12/31/2014   Anticipated DC Plan:  HOME W HOME HEALTH SERVICES      DC Planning Services  CM consult      [facility]AC Choice  HOME HEALTH   Choice offered to / List presented to:  C-4 Adult Children        HH arranged  HH-2 PT  HH-1 RN      [facility]   Status of service:  Completed, signed off Medicare Important Message given?  YES (If response is "NO", the following Medicare IM given date fields will be blank) Date Medicare IM given:  12/29/2014 Medicare IM given by:  Gae GallopOLE,ANGELA Date Additional Medicare IM given:  01/01/2015 Additional Medicare IM given by:  Gae GallopANGELA COLE  [facility]  [facility]  Per UR Regulation:  Reviewed for med. necessity/level of care/duration of stay  If discussed at Long Length of Stay Meetings, dates discussed:   12/31/2014    Comments:  01/04/2015 1220 NCM spoke to pt and gave permission to speak to son, Clide CliffDonald [name] [phone]. [name]. [facility] and are [facility]. Contacted [facility] with new referral. Pt has CPAP, [name]. No DME needed. Pt sat 99% on RA. Isidoro DonningAlesia Dartha Rozzell RN [facility] Case Mgmt phone [phone]  01/01/15- 1630- Donn PieriniKristi Webster RN, BSN [phone] Referral received for potential CIR- per PT notes from today pt min. assist [facility]- spoke with pt and daughter at bedside- pt has 24/7 caregivers in the home that they pay out of pocket for. Pt has had HH in the past with multiple HH agencies. Pt also has been to CIR before-  daughter has concerns with pt going home if she can not walk that far. (ambulated 37 ft per PT note) Pt states that she is not interested in going to ST[facility]NF/rehab she wants to return [facility] caregivers [facility]. Pt and family to continue to talk about rehab option and what is going to be safest plan for pt- will need PT to see pt again to help assist in most appropriate plan HH vs SNF vs CIR.

## 2015-01-04 NOTE — Discharge Summary (Signed)
Physician Discharge Summary  [name] IWP:[id number] DOB: 31-Jul-1924 DOA: 12/26/2014  PCP: Mathews Argyle, MD  Admit date: 12/26/2014 Discharge date: 01/04/2015  Time spent: 35 minutes  Recommendations for Outpatient Follow-up:  B-met to be drawn 3-9, to follow renal function and sodium level. Lasix to be adjusted depending on results.  Follow up with Dr Radford Pax for further treatment of HF.   Discharge Diagnoses:    Diastolic CHF, acute on chronic   Acute kidney injury   UTI (lower urinary tract infection)   Hyperkalemia   Elevated troponin I level   SOB (shortness of breath)   Acute renal failure syndrome   Discharge Condition: Stable   Diet recommendation: Heart healthy  Filed Weights   01/02/15 0445 01/03/15 0358 01/04/15 0438  Weight: 73.8 kg (162 lb 11.2 oz) 73.3 kg (161 lb 9.6 oz) 73.6 kg (162 lb 4.1 oz)    History of present illness:  [name] is a 81 y.o. woman with multiple comorbidities and an extensive cardiac history including chronic diastolic HF, mild aortic and mitral valve stenosis (last echo 12/2013), and atrial fibrillation (chronic anticoagulation with Eliquis) who was in her baseline state of health until 3-4 days ago. Two daughters are at bedside and they confirm a 3-4 day history of progressive weakness and at least a six pound weight gain. The patient reports that her legs have been weak, making it difficult to move around her home, even with the assistance of her caregivers (she has someone present 24 hours/day). The patient denies any LUTS. "I am cold all the time." Denies fever, chills, or sweats. No N/V. No light-headedness. No chest pain or shortness of breath. Her daughter called her cardioloy office this morning to report the weight gain and was advised to give an extra dose of oral lasix (80m) and an extra dose of potassium. The patient ended up in the ED due to weakness and is found to have a UTI and AKI with hyperkalemia.  She also has a borderline troponin level of 0.09, of unclear significance.  Of note, she has a new oxygen requirement of 4L [place]. She does not wear home oxygen, but she uses CPAP qHS for hx of OSA. Chest xray suggestive of pulmonary edema and BNP elevated to 1400. She is being admitted to the step down unit for further management.  Hospital Course:  81y/o female with PMH of HTN, DM, CKD, OSA/CPAP, Diastolic CHF, mild Aortic Stenosis, A fib (on Eliquis) presented with generalized weakness, fatigue, chills, with sujective fever; patient also reported weight gain, DOE.  -patient is admitted with UTI, CHF with mild pulmonary edema, hyperkalemia, AKI   1. Acute on chronic CHF, diastolic HF, pulmonary HTN; probable CHF worse due to AS/MS with PAF; initially CXR showed mild edema, which has improved after diuresis/BiPAP;  Lasix change to 40 mg PO BID the day of discharge from 80 mg PO BID.  will cont BB, not on ACE due to AKI; monitor renal function  Need Bmet to follow cr and sodium level.  Suspect renal function will improved with decrease dose of lasix.   2. Mild acute hypoxic respiratory failure on admission likely due to CHF; hypoxia resolving on diuresis/BiPAP at night for OSA; cont as above   3. UTI started on IV atx, prelim urine cultures: E coli, sensitive to ceftriaxone and cefazolin. Received 7 days  of antibiotics.  WBC normalized.   4. Initial Hyperkalemia on admission; resolved.   5. PAF on Eliquis; cont  BB; added amio per cards;  AS/MS: per cardiology, patient/family discussions: patient is not interested in surgical intervention;  6. Two chronic wounds on right foot. Patietn with PAD. Followed by podiatrist, Dr. Rolley Sims at [facility] Next appointment on Monday, 01/04/15; appreciate wound care evaluation. Continue with wound care.   7. DM diet controlled; cont ISS while inpatient   8-Hands with cyanosis: patient notice to have bluish discolorations of hands, her  right hand was cold. Pulse difficult to palpate. Dr Saunders Revel evaluated patient, she had good pulse by doppler. No need for arteriogram. Monitor for now.   9-Hyponatremia; related to HF. Will arrange B-met to be drawn on 3-9  Procedures: Echo Impressions:  - The patient was in atrial fibrillation. Normal LV size with mild LV hypertrophy. Moderately dilated RV with mildly decreased systolic function. The aortic valve was severely calcified. Mean gradient was only 15 mmHg suggesting mild AS but calculated valve area 0.73 cm^2, suggesting severe. Visually at least moderate aortic stenosis. This may need further characterization by TEE. The mitral valve was heavily calcified. There appeared to be moderate mitral stenosis with mean gradient 8 mmHg. There was at least moderate MR but shadowing from calcified valve makes this difficult to fully evaluate. Severe TR. Moderate pulmonary hypertension. Dilated IVC. Overall, TEE may be helpful to assess the valves more closely.  Consultations:  Cardiology  Discharge Exam: Filed Vitals:   01/04/15 1000  BP: 98/58  Pulse:   Temp:   Resp:     General: Alert in no distress.  Cardiovascular: S 1, S 2 RRR Respiratory: CTA LE with edema.   Discharge Instructions   Discharge Instructions    Diet - low sodium heart healthy    Complete by:  As directed      Increase activity slowly    Complete by:  As directed           Current Discharge Medication List    START taking these medications   Details  amiodarone (PACERONE) 200 MG tablet Take 1 tablet (200 mg total) by mouth 2 (two) times daily. Qty: 60 tablet, Refills: 0      CONTINUE these medications which have CHANGED   Details  furosemide (LASIX) 40 MG tablet Take 1 tablet (40 mg total) by mouth 2 (two) times daily. Qty: 30 tablet, Refills: 0      CONTINUE these medications which have NOT CHANGED   Details  acetaminophen (TYLENOL) 500 MG tablet Take 500  mg by mouth at bedtime. Give with the Tramadol 50 mg    allopurinol (ZYLOPRIM) 300 MG tablet Take 1 tablet (300 mg total) by mouth daily. Qty: 60 tablet, Refills: 1    apixaban (ELIQUIS) 2.5 MG TABS tablet Take 1 tablet (2.5 mg total) by mouth 2 (two) times daily. Qty: 60 tablet, Refills: 0    atorvastatin (LIPITOR) 10 MG tablet Take 1 tablet (10 mg total) by mouth daily. Qty: 30 tablet, Refills: 1    Calcium Carbonate-Vitamin D (CALCIUM-VITAMIN D) 600-200 MG-UNIT CAPS Take 1 tablet by mouth daily.     gabapentin (NEURONTIN) 300 MG capsule Take 1 capsule (300 mg total) by mouth at bedtime. Qty: 30 capsule, Refills: 1    ketoconazole (NIZORAL) 2 % cream     levothyroxine (SYNTHROID, LEVOTHROID) 112 MCG tablet Take 112 mcg by mouth daily before breakfast.    metoprolol succinate (TOPROL-XL) 25 MG 24 hr tablet Take 0.5 tablets (12.5 mg total) by mouth daily. Qty: 15 tablet, Refills: 1  Multiple Vitamins-Minerals (MULTIVITAMIN WITH MINERALS) tablet Take 1 tablet by mouth daily.    PRESCRIPTION MEDICATION Apply 1 application topically every other day. Idosorb    silver sulfADIAZINE (SILVADENE) 1 % cream Apply 1 application topically daily. Qty: 50 g, Refills: 0    ONE TOUCH ULTRA TEST test strip 1 each by Other route See admin instructions. Check blood sugar once daily.    traMADol (ULTRAM) 50 MG tablet Take 50 mg by mouth every 8 (eight) hours as needed for moderate pain. Give with Tylenol 500 mg      STOP taking these medications     potassium chloride (K-DUR,KLOR-CON) 10 MEQ tablet        Allergies  Allergen Reactions  . Ambien [Zolpidem Tartrate] Other (See Comments)    Pt does not wakeup from [place]  . Aspirin Other (See Comments)    Tears my stomach up; "can take coated Aspirin qd without problems"  . Nexium [Esomeprazole Magnesium] Rash  . Penicillins Rash and Other (See Comments)    Tolerates Rocephin   Follow-up Information    Follow up with Sueanne Margarita,  MD In 3 days.   Specialty:  Cardiology   Contact information:   [address] [place] [address] [phone]        The results of significant diagnostics from this hospitalization (including imaging, microbiology, ancillary and laboratory) are listed below for reference.    Significant Diagnostic Studies: Dg Chest 2 View  12/31/2014   CLINICAL DATA:  CHF. Short of breath. Chronic back pain. Initial encounter.  EXAM: CHEST  2 VIEW  COMPARISON:  12/28/2014.  FINDINGS: Cardiomegaly is present. There is pulmonary vascular congestion. Basilar airspace disease is present. Probable small bilateral pleural effusions. Monitoring leads project over the chest. Aortic arch tortuosity and atherosclerosis.  IMPRESSION: Findings compatible with mild CHF.   Electronically Signed   By: Dereck Ligas M.D.   On: 12/31/2014 13:43   Dg Chest 2 View  12/28/2014   CLINICAL DATA:  Atrial fibrillation, peripheral arterial disease, hypertension, CHF  EXAM: CHEST  2 VIEW  COMPARISON:  12/26/2014  FINDINGS: Mild bilateral interstitial thickening. No pleural effusion, focal consolidation or pneumothorax. Stable cardiomegaly. Thoracic aortic atherosclerosis. Prominence of the central pulmonary vasculature.  No acute osseous abnormality.  IMPRESSION: Cardiomegaly with mild interstitial edema.   Electronically Signed   By: Kathreen Devoid   On: 12/28/2014 12:34   Dg Chest Port 1 View  12/26/2014   CLINICAL DATA:  Weakness and respiratory distress.  EXAM: PORTABLE CHEST - 1 VIEW  COMPARISON:  5 hours prior.  FINDINGS: Stable cardiomegaly and mitral annulus calcifications. Slight decrease in vascular congestion. Minimal left basilar subsegmental atelectasis. No consolidation to suggest pneumonia. No large pleural effusion or pneumothorax.  IMPRESSION: Stable cardiomegaly.  Decreased vascular congestion.   Electronically Signed   By: Jeb Levering M.D.   On: 12/26/2014 23:02   Dg Chest Port 1  View  12/26/2014   CLINICAL DATA:  Shortness of breath  EXAM: PORTABLE CHEST - 1 VIEW  COMPARISON:  02/10/2014  FINDINGS: There is cardiomegaly. Mild vascular congestion. No overt edema. No confluent opacities or effusions. No acute bony abnormality. Dense mitral valve annular calcifications again noted.  IMPRESSION: Cardiomegaly, vascular congestion.   Electronically Signed   By: Rolm Baptise M.D.   On: 12/26/2014 18:08   Mm Digital Diagnostic Unilat L  12/11/2014   CLINICAL DATA:  Six month followup evaluation of a benign concordant left breast ultrasound-guided  core biopsy. Patient is currently wheelchair-bound and unable to stand for the examination.  EXAM: DIGITAL DIAGNOSTIC left breast MAMMOGRAM WITH CAD  COMPARISON:  06/05/2014, 05/22/2014, 05/16/2013.  ACR Breast Density Category b: There are scattered areas of fibroglandular density.  FINDINGS: The small focal density located medially within the left breast has decreased and is compatible with evolving fat necrosis. There are no findings worrisome for developing malignancy within the left breast. Limited evaluation in the left MLO projection due to the patient's physical status.  Mammographic images were processed with CAD.  IMPRESSION: No findings worrisome for developing malignancy. If felt to be clinically indicated given the patient's medical status, recommend bilateral screening mammography in July, 2016.  RECOMMENDATION: Screening mammography in July, 2016 if felt to be clinically indicated due to the patient's age and medical status.  I have discussed the findings and recommendations with the patient. Results were also provided in writing at the conclusion of the visit. If applicable, a reminder letter will be sent to the patient regarding the next appointment.  BI-RADS CATEGORY  2: Benign.   Electronically Signed   By: Altamese Cabal M.D.   On: 12/11/2014 10:56    Microbiology: Recent Results (from the past 240 hour(s))  Urine culture      Status: None   Collection Time: 12/26/14  7:14 PM  Result Value Ref Range Status   Specimen Description URINE, CLEAN CATCH  Final   Special Requests ADDED [id number] 12/27/14  Final   Colony Count   Final    >=100,000 COLONIES/ML Performed at [facility]    Culture   Final    ESCHERICHIA COLI Performed at [facility]    Report Status 12/29/2014 FINAL  Final   Organism ID, Bacteria ESCHERICHIA COLI  Final      Susceptibility   Escherichia coli - MIC*    AMPICILLIN >=32 RESISTANT Resistant     CEFAZOLIN <=4 SENSITIVE Sensitive     CEFTRIAXONE <=1 SENSITIVE Sensitive     CIPROFLOXACIN >=4 RESISTANT Resistant     GENTAMICIN <=1 SENSITIVE Sensitive     LEVOFLOXACIN >=8 RESISTANT Resistant     NITROFURANTOIN <=16 SENSITIVE Sensitive     TOBRAMYCIN <=1 SENSITIVE Sensitive     TRIMETH/SULFA <=20 SENSITIVE Sensitive     PIP/TAZO <=4 SENSITIVE Sensitive     * ESCHERICHIA COLI     Labs: Basic Metabolic Panel:  Recent Labs Lab 12/31/14 0345 01/01/15 0345 01/02/15 0821 01/03/15 0436 01/04/15 0458  NA 132* 132* 133* 128* 128*  K 5.1 4.0 3.7 3.6 4.4  CL 97 99 95* 95* 95*  CO2 _0 GLUCOSE 116* 118* 104* 104* 101*  BUN 49* 46* 40* 45* 53*  CREATININE 1.31* 1.38* 1.27* 1.30* 1.58*  CALCIUM 8.3* 8.3* 8.5 8.1* 8.2*   Liver Function Tests: No results for input(s): AST, ALT, ALKPHOS, BILITOT, PROT, ALBUMIN in the last 168 hours. No results for input(s): LIPASE, AMYLASE in the last 168 hours. No results for input(s): AMMONIA in the last 168 hours. CBC:  Recent Labs Lab 12/29/14 0436 12/31/14 0345 01/01/15 0345 01/02/15 0821 01/03/15 0436  WBC 12.3* 9.1 10.9* 11.1* 10.2  HGB 10.8* 11.0* 10.3* 10.7* 10.4*  HCT 33.9* 33.8* 31.3* 33.3* 32.2*  MCV 100.9* 96.3 96.9 96.2 96.4  PLT 158 168 178 184 177   Cardiac Enzymes: No results for input(s): CKTOTAL, CKMB, CKMBINDEX, TROPONINI in the last 168 hours. BNP: BNP (last 3 results)  Recent Labs  12/26/14 1800 12/27/14 0344 12/30/14 0924  BNP 1404.5* 1339.0* 1176.8*    ProBNP (last 3 results)  Recent Labs  02/03/14 2158 02/12/14 1421 07/22/14 1213  PROBNP 2950.0* 1542.0* 1209.0*    CBG:  Recent Labs Lab 01/03/15 1212 01/03/15 1701 01/03/15 2103 01/04/15 0547 01/04/15 1116  GLUCAP 134* 180* 167* 106* 144*       Signed:  Townsend Cudworth A  [facility] 01/04/2015, 11:51 AM

## 2015-01-04 NOTE — Progress Notes (Signed)
   SUBJECTIVE:  Denies dyspnea or chest pain, smiling sitting in chair. Feels good.   OBJECTIVE:   Vitals:   Filed Vitals:   01/03/15 1500 01/03/15 2057 01/04/15 0438 01/04/15 0448  BP: 93/55 115/49 93/62 94/60   Pulse: 91 86 88   Temp: 97.6 F (36.4 C) 97.7 F (36.5 C) 97.9 F (36.6 C)   TempSrc: Oral Oral Oral   Resp: 18 18 18    Height:      Weight:   162 lb 4.1 oz (73.6 kg)   SpO2: 99% 98% 94%    I&O's:    Intake/Output Summary (Last 24 hours) at 01/04/15 0932 Last data filed at 01/03/15 1700  Gross per 24 hour  Intake    720 ml  Output      0 ml  Net    720 ml       PHYSICAL EXAM General: Well developed, well nourished, in no acute distress Head: Normal  Lungs:   CTA Heart:   Irregular Abdomen:  soft and non-tender without masses  Extremities:   No significant edema.   Neuro: Alert and oriented X 3. Psych:  Good affect, responds appropriately   LABS: Basic Metabolic Panel:  Recent Labs  40/98/1102/04/14 0436 01/04/15 0458  NA 128* 128*  K 3.6 4.4  CL 95* 95*  CO2 24 27  GLUCOSE 104* 101*  BUN 45* 53*  CREATININE 1.30* 1.58*  CALCIUM 8.1* 8.2*   CBC:  Recent Labs  01/02/15 0821 01/03/15 0436  WBC 11.1* 10.2  HGB 10.7* 10.4*  HCT 33.3* 32.2*  MCV 96.2 96.4  PLT 184 177   Coag Panel:   Lab Results  Component Value Date   INR 1.13 10/20/2014   INR 1.30 04/08/2013   INR 1.43 05/29/2012     ASSESSMENT/PLAN:  1. Acute on chronic diastolic CHF - patient symptomatically improved; change lasix to 40 mg po BID from 80. She was on 40 BID at home with occasional 80 PRN. Creat went up.; follow NA and renal function - continue as outpatient. Would fluid restrict to 1.5 liters per day  2. Chronic atrial fibrillation/flutter-rate is reasonably well controlled. Continue amiodarone and metoprolol. Continue apixaban.  3. AS - repeat 2D echo with at least moderate AS - She is not interested in any surgical intervention or further testing; continue to  treat medically.   4. Moderate MS/MR with moderate pulmonary HTN with PASP 61mmHg  5. OSA on CPAP- per IM 6. Severe PVD - per IM 7. Type II DM - per IM 8. HTN - controlled on BB 9. Hypokalemia - repleted 10. Elevated Troponin -no further eval 11. Dementia - per IM 12. Hyponatremia secondary to CHF-fluid restrict 13. UTI on antibiotics  FU Dr Mayford Knifeurner at DC. OK for DC with close follow up. Check BMET in 3-7days.   Donato SchultzSKAINS, MARK, MD  01/04/2015  9:32 AM

## 2015-01-04 NOTE — Progress Notes (Signed)
DC IV and tele per MD orders and protocol; DC instructions reviewed and signed by family; Paper prescriptions given to family; no further questions; pt [facility].  Hermina BartersBOWMAN, Hikaru Delorenzo M, RN

## 2015-01-05 ENCOUNTER — Telehealth: Payer: Self-pay | Admitting: Cardiology

## 2015-01-05 NOTE — Telephone Encounter (Signed)
Patient's husband called stating that patient has developed severe bilateral LE edema. [facility] diastolic CHF on 3/7 and acute kidney injury and Lasix was decreased to 40mg  BID from 80mg  in the am and 40mg  in the pm.  This am she weighed 168lbs and this evening she weigh 171lbs. She went home yesterday but she did not weigh herself.  Her weight on discharge was 162lbs but scales may not be accurate.  Instructed patient to take another 40mg  of lasix tonight as she has already taken her evening dose of 40mg  lasix.  I have also instructed the patient to call to be seen in the office tomorrow.

## 2015-01-06 ENCOUNTER — Telehealth: Payer: Self-pay | Admitting: Cardiology

## 2015-01-06 ENCOUNTER — Ambulatory Visit: Payer: Medicare Other | Admitting: Podiatrist

## 2015-01-06 ENCOUNTER — Encounter: Payer: Self-pay | Admitting: Physician Assistant

## 2015-01-06 ENCOUNTER — Ambulatory Visit (INDEPENDENT_AMBULATORY_CARE_PROVIDER_SITE_OTHER): Payer: Medicare Other | Admitting: Physician Assistant

## 2015-01-06 VITALS — BP 98/78 | HR 66 | Ht 66.0 in

## 2015-01-06 DIAGNOSIS — I350 Nonrheumatic aortic (valve) stenosis: Secondary | ICD-10-CM

## 2015-01-06 DIAGNOSIS — I482 Chronic atrial fibrillation, unspecified: Secondary | ICD-10-CM

## 2015-01-06 DIAGNOSIS — I952 Hypotension due to drugs: Secondary | ICD-10-CM

## 2015-01-06 DIAGNOSIS — N289 Disorder of kidney and ureter, unspecified: Secondary | ICD-10-CM

## 2015-01-06 DIAGNOSIS — I959 Hypotension, unspecified: Secondary | ICD-10-CM | POA: Insufficient documentation

## 2015-01-06 DIAGNOSIS — I5033 Acute on chronic diastolic (congestive) heart failure: Secondary | ICD-10-CM | POA: Diagnosis not present

## 2015-01-06 DIAGNOSIS — R339 Retention of urine, unspecified: Secondary | ICD-10-CM | POA: Diagnosis not present

## 2015-01-06 DIAGNOSIS — G4733 Obstructive sleep apnea (adult) (pediatric): Secondary | ICD-10-CM

## 2015-01-06 DIAGNOSIS — I340 Nonrheumatic mitral (valve) insufficiency: Secondary | ICD-10-CM

## 2015-01-06 DIAGNOSIS — R531 Weakness: Secondary | ICD-10-CM

## 2015-01-06 LAB — BASIC METABOLIC PANEL
BUN: 63 mg/dL — ABNORMAL HIGH (ref 6–23)
CALCIUM: 9 mg/dL (ref 8.4–10.5)
CO2: 28 meq/L (ref 19–32)
CREATININE: 1.63 mg/dL — AB (ref 0.40–1.20)
Chloride: 90 mEq/L — ABNORMAL LOW (ref 96–112)
GFR: 31.62 mL/min — ABNORMAL LOW (ref >60.00)
Glucose, Bld: 186 mg/dL — ABNORMAL HIGH (ref 70–99)
POTASSIUM: 4.3 meq/L (ref 3.5–5.1)
SODIUM: 124 meq/L — AB (ref 135–145)

## 2015-01-06 LAB — BRAIN NATRIURETIC PEPTIDE: Pro B Natriuretic peptide (BNP): 1412 pg/mL — ABNORMAL HIGH (ref 0.0–100.0)

## 2015-01-06 NOTE — Telephone Encounter (Signed)
Quintella Reichertraci R Turner, MD at 01/05/2015 9:20 PM     Status: Signed       Expand All Collapse All   Patient's husband called stating that patient has developed severe bilateral LE edema. [facility] with acute diastolic CHF on 3/7 and acute kidney injury and Lasix was decreased to 40mg  BID from 80mg  in the am and 40mg  in the pm. This am she weighed 168lbs and this evening she weigh 171lbs. She went home yesterday but she did not weigh herself. Her weight on discharge was 162lbs but scales may not be accurate. Instructed patient to take another 40mg  of lasix tonight as she has already taken her evening dose of 40mg  lasix. I have also instructed the patient to call to be seen in the office tomorrow.        [facility] am.   Per son, patient's weight this am is improved after extra lasix dose yesterday, she is back to 168.   Son states yesterday with weight increase patient was wheezing.  She is tired today.  Still very edematous.

## 2015-01-06 NOTE — Addendum Note (Signed)
Addended by: Arcola JanskyOOK, BETHANY M on: 01/06/2015 02:04 PM   Modules accepted: Orders

## 2015-01-06 NOTE — Assessment & Plan Note (Signed)
We will cotinue to monitor.

## 2015-01-06 NOTE — Addendum Note (Signed)
Addended by: Arcola JanskyOOK, BETHANY M on: 01/06/2015 01:02 PM   Modules accepted: Orders

## 2015-01-06 NOTE — Patient Instructions (Addendum)
Your physician recommends that you have lab work today: BNP/ BMP/ urinalysis  Your physician has recommended you make the following change in your medication:  1) Increase lasix (furosemide) to 80 mg one tablet by mouth twice daily  Your physician recommends that you schedule a follow-up appointment: Monday- 3/14 (flex PA/ NP)

## 2015-01-06 NOTE — Progress Notes (Signed)
Patient ID: [name], female   DOB: 09-15-1924, 81 y.o.   MRN: [id number]    Date:  01/06/2015   ID:  [name], DOB [birth year], MRN [id number]  PCP:  Ginette Otto, MD  Primary Cardiologist:   Mayford Knife   Chief Complaint  Patient presents with  . Labs Only     History of Present Illness:  [name] is a 81 y.o. female  With a history of hypertension, atrial flutter/ relation , hyperlipidemia , diabetes, peripheral artery disease , sleep apnea hypothyroidism , stroke , moderate aortic stenosis, severe tricuspid regurgitation, pulmonary hypertension.    She was brought to the emergency department for evaluation of sudden onset loss of energy and weakness.  2-D echocardiogram on 02/26/2015 showed an ejection fraction of 60-65% with normal wall motion.   Was mild aortic stenosis by mean gradient severe bivalve area. Moderate mitral valve regurgitation. Moderate pulmonary hypertension pressures of 61 mmHg. Severe tricuspid regurgitation.   She was just discharged on March 7.   She presents to the clinic with severe lower extremity edema and weight gain.   The son states that he weighed her yesterday morning she was 167 pounds anywhere later that evening and she was 171.8. She was given an extra 40 mg of Lasix last night her weight this morning is 166.   She denies any shortness of breath orthopnea or PND.   She is supposed to have home health PT  Started on Friday,   However, her son is inquiring about skilled nursing facility.   It is difficult to get her standing up.   He otherwise feels well.  The patient currently denies nausea, vomiting, fever, chest pain, dizziness, PND, cough, congestion, abdominal pain, hematochezia, melena.  Wt Readings from Last 3 Encounters:  12/11/14 145 lb (65.772 kg)  09/09/14 149 lb (67.586 kg)  07/22/14 156 lb (70.761 kg)     Past Medical History  Diagnosis Date  . Hypertension   . Peripheral neuropathy   . Asthma   . High  cholesterol   . Diabetes with neurologic complications   . PAD (peripheral artery disease)   . Enlarged heart   . Anginal pain 05/28/12  . Pneumonia 11/2010  . Sleep apnea     cpap  . Hypothyroidism   . Headache(784.0) 05/29/12    "recently"  . Stroke 05/2005; 08/2010    !mini"  . Personal history of gout   . Hypothyroidism   . DJD (degenerative joint disease)   . Carpal tunnel syndrome, bilateral   . Recurrent labyrinthitis   . GERD (gastroesophageal reflux disease)     S/P esophageal dilation in 1996  . Gallstone     Silent  . Fibrocystic breast changes   . Hemorrhoids     with bleeding  . Hx of adenomatous colonic polyps     Dr Sherin Quarry  . Diverticulitis     left side  . MR (mitral regurgitation)     moderate  . Mild anemia     hemoglobin 11.8 on 06/2009  . Edema of lower extremity     Chronic  . Ischemic colitis 8/13  . Aortic stenosis, moderate   . Chronic diastolic CHF (congestive heart failure)   . Moderate mitral regurgitation   . CKD (chronic kidney disease) stage 3, GFR 30-59 ml/min     "something on labs always say she has some problems"  . Chronic atrial fibrillation   . Peripheral arterial disease  critical limb ischemia, right third toe    Current Outpatient Prescriptions  Medication Sig Dispense Refill  . acetaminophen (TYLENOL) 500 MG tablet Take 500 mg by mouth at bedtime. Give with the Tramadol 50 mg    . allopurinol (ZYLOPRIM) 300 MG tablet Take 1 tablet (300 mg total) by mouth daily. 60 tablet 1  . amiodarone (PACERONE) 200 MG tablet Take 1 tablet (200 mg total) by mouth 2 (two) times daily. 60 tablet 0  . apixaban (ELIQUIS) 2.5 MG TABS tablet Take 1 tablet (2.5 mg total) by mouth 2 (two) times daily. 60 tablet 0  . atorvastatin (LIPITOR) 10 MG tablet Take 1 tablet (10 mg total) by mouth daily. (Patient taking differently: Take 10 mg by mouth daily at 6 PM. ) 30 tablet 1  . Calcium Carbonate-Vitamin D (CALCIUM-VITAMIN D) 600-200 MG-UNIT CAPS Take  1 tablet by mouth daily.     . furosemide (LASIX) 40 MG tablet Take two tablets (80 mg) by mouth twice daily    . gabapentin (NEURONTIN) 300 MG capsule Take 1 capsule (300 mg total) by mouth at bedtime. 30 capsule 1  . ketoconazole (NIZORAL) 2 % cream     . levothyroxine (SYNTHROID, LEVOTHROID) 112 MCG tablet Take 112 mcg by mouth daily before breakfast.    . metoprolol succinate (TOPROL-XL) 25 MG 24 hr tablet Take 0.5 tablets (12.5 mg total) by mouth daily. 15 tablet 1  . Multiple Vitamins-Minerals (MULTIVITAMIN WITH MINERALS) tablet Take 1 tablet by mouth daily.    . ONE TOUCH ULTRA TEST test strip 1 each by Other route See admin instructions. Check blood sugar once daily.    Marland Kitchen. PRESCRIPTION MEDICATION Apply 1 application topically every other day. Idosorb    . silver sulfADIAZINE (SILVADENE) 1 % cream Apply 1 application topically daily. (Patient taking differently: Apply 1 application topically every other day. ) 50 g 0   No current facility-administered medications for this visit.    Allergies:    Allergies  Allergen Reactions  . Ambien [Zolpidem Tartrate] Other (See Comments)    Pt [place] from [place]  . Aspirin Other (See Comments)    Tears my stomach up; "can take coated Aspirin qd without problems"  . Nexium [Esomeprazole Magnesium] Rash  . Penicillins Rash and Other (See Comments)    Tolerates Rocephin    Social History:  The patient  reports that she has never smoked. She has never used smokeless tobacco. She reports that she does not drink alcohol or use illicit drugs.   Family history:   Family History  Problem Relation Age of Onset  . Heart disease Mother   . Heart disease Father   . Heart disease Brother     ROS:  Please see the history of present illness.  All other systems reviewed and negative.   PHYSICAL EXAM: VS:  BP 98/78 mmHg  Pulse 66  Ht 5\' 6"  (1.676 m)  Wt  Well nourished, well developed, in no acute distress HEENT: Pupils are equal round  react to light accommodation extraocular movements are intact.  Neck:  elevated JVDNo cervical lymphadenopathy. Cardiac:  Irregular rate and rhythm with a 1/6 systolc mumr Lungs:  clear to auscultation bilaterally, no wheezing, rhonchi or rales Abd: soft, nontender, positive bowel sounds all quadrants Ext: s lower extremity edema.  2+ radial and dorsalis pedis pulses. Skin: warm and dry Neuro:  Grossly normal  EKG:   Atrial flutter rate 60 bpm  ASSESSMENT AND PLAN:  Problem List Items  Addressed This Visit    Weakness     Patient may need to be transferred to skilled nursing facility where she can around-the-clock care. Her son says her mobility is extremely limited.   Home health PT is scheduled to start on Friday      OSA (obstructive sleep apnea)   Moderate mitral regurgitation     Patient is not a surgical candidate.      Relevant Medications   furosemide (LASIX) tablet   Hypotension     Stop Toprol      Relevant Medications   furosemide (LASIX) tablet   Diastolic CHF, acute on chronic    Patient is significantly volume overloaded. She has 3+ pitting edema in her lower extremities. This is multifactorial volume overload in the setting of mitral valve disease pulmonary hypertension tricuspid regurgitation.   She is not overly symptomatic. She has no orthopnea or PND.   We'll continue diuresis with 80 mg of Lasix twice daily. Stopped her Toprol effort to help her blood pressure. Her heart rate is well controlled. Check a BNP, basic metabolic panel and a urinalysis -due to decreased urinary output. We'll have her come back in on Monday for follow-up.   I think she just needs a very slow diuresis, if this is possible.   I will review labs this afternoon.      Relevant Medications   furosemide (LASIX) tablet   Other Relevant Orders   EKG 12-Lead   B Nat Peptide   Basic Metabolic Panel (BMET)   Chronic atrial fibrillation - Primary     Rate well controlled.  I am going to stop  her Toprol  Secondary to hypotension.      Relevant Medications   furosemide (LASIX) tablet   Other Relevant Orders   EKG 12-Lead   Aortic stenosis, moderate     We will cotinue to monitor.      Relevant Medications   furosemide (LASIX) tablet    Other Visit Diagnoses    Renal insufficiency        Relevant Orders    Basic Metabolic Panel (BMET)    Urinary retention        Relevant Orders    Urinalysis

## 2015-01-06 NOTE — Assessment & Plan Note (Signed)
Rate well controlled.  I am going to stop her Toprol  Secondary to hypotension.

## 2015-01-06 NOTE — Assessment & Plan Note (Signed)
Patient may need to be transferred to skilled nursing facility where she can around-the-clock care. Her son says her mobility is extremely limited.   Home health PT is scheduled to start on Friday

## 2015-01-06 NOTE — Assessment & Plan Note (Signed)
Stop Toprol.   

## 2015-01-06 NOTE — Addendum Note (Signed)
Addended by: Arcola JanskyOOK, Reis Pienta M on: 01/06/2015 02:03 PM   Modules accepted: Orders

## 2015-01-06 NOTE — Telephone Encounter (Signed)
New Message        Pt's son calling stating that he spoke to Dr. Mayford Knifeurner last night and she wants pt to be seen today. Please see note in chart from Dr. Mayford Knifeurner. Please call back and advise.

## 2015-01-06 NOTE — Assessment & Plan Note (Signed)
Patient is not a surgical candidate.

## 2015-01-06 NOTE — Assessment & Plan Note (Signed)
Patient is significantly volume overloaded. She has 3+ pitting edema in her lower extremities. This is multifactorial volume overload in the setting of mitral valve disease pulmonary hypertension tricuspid regurgitation.   She is not overly symptomatic. She has no orthopnea or PND.   We'll continue diuresis with 80 mg of Lasix twice daily. Stopped her Toprol effort to help her blood pressure. Her heart rate is well controlled. Check a BNP, basic metabolic panel and a urinalysis -due to decreased urinary output. We'll have her come back in on Monday for follow-up.   I think she just needs a very slow diuresis, if this is possible.   I will review labs this afternoon.

## 2015-01-07 ENCOUNTER — Encounter (HOSPITAL_COMMUNITY): Payer: Self-pay | Admitting: *Deleted

## 2015-01-07 ENCOUNTER — Other Ambulatory Visit (HOSPITAL_COMMUNITY): Payer: Self-pay

## 2015-01-07 ENCOUNTER — Other Ambulatory Visit: Payer: Self-pay

## 2015-01-07 ENCOUNTER — Inpatient Hospital Stay (HOSPITAL_COMMUNITY)
Admission: EM | Admit: 2015-01-07 | Discharge: 2015-01-11 | DRG: 291 | Disposition: A | Discharge status: Skilled Nursing Facility | Payer: Medicare Other | Attending: Internal Medicine | Admitting: Internal Medicine

## 2015-01-07 ENCOUNTER — Encounter: Payer: 59 | Admitting: Physician Assistant

## 2015-01-07 DIAGNOSIS — G4730 Sleep apnea, unspecified: Secondary | ICD-10-CM | POA: Diagnosis present

## 2015-01-07 DIAGNOSIS — Z66 Do not resuscitate: Secondary | ICD-10-CM | POA: Diagnosis present

## 2015-01-07 DIAGNOSIS — Z880 Allergy status to penicillin: Secondary | ICD-10-CM | POA: Diagnosis not present

## 2015-01-07 DIAGNOSIS — M109 Gout, unspecified: Secondary | ICD-10-CM | POA: Diagnosis present

## 2015-01-07 DIAGNOSIS — E871 Hypo-osmolality and hyponatremia: Secondary | ICD-10-CM | POA: Diagnosis present

## 2015-01-07 DIAGNOSIS — I083 Combined rheumatic disorders of mitral, aortic and tricuspid valves: Secondary | ICD-10-CM | POA: Diagnosis present

## 2015-01-07 DIAGNOSIS — G629 Polyneuropathy, unspecified: Secondary | ICD-10-CM | POA: Diagnosis present

## 2015-01-07 DIAGNOSIS — I340 Nonrheumatic mitral (valve) insufficiency: Secondary | ICD-10-CM | POA: Diagnosis present

## 2015-01-07 DIAGNOSIS — Z888 Allergy status to other drugs, medicaments and biological substances status: Secondary | ICD-10-CM | POA: Diagnosis not present

## 2015-01-07 DIAGNOSIS — I482 Chronic atrial fibrillation, unspecified: Secondary | ICD-10-CM | POA: Diagnosis present

## 2015-01-07 DIAGNOSIS — D631 Anemia in chronic kidney disease: Secondary | ICD-10-CM | POA: Diagnosis present

## 2015-01-07 DIAGNOSIS — E785 Hyperlipidemia, unspecified: Secondary | ICD-10-CM | POA: Diagnosis present

## 2015-01-07 DIAGNOSIS — Z515 Encounter for palliative care: Secondary | ICD-10-CM

## 2015-01-07 DIAGNOSIS — I471 Supraventricular tachycardia: Secondary | ICD-10-CM

## 2015-01-07 DIAGNOSIS — Z79899 Other long term (current) drug therapy: Secondary | ICD-10-CM

## 2015-01-07 DIAGNOSIS — N179 Acute kidney failure, unspecified: Secondary | ICD-10-CM | POA: Diagnosis not present

## 2015-01-07 DIAGNOSIS — Z993 Dependence on wheelchair: Secondary | ICD-10-CM

## 2015-01-07 DIAGNOSIS — I739 Peripheral vascular disease, unspecified: Secondary | ICD-10-CM | POA: Diagnosis present

## 2015-01-07 DIAGNOSIS — E1149 Type 2 diabetes mellitus with other diabetic neurological complication: Secondary | ICD-10-CM | POA: Diagnosis present

## 2015-01-07 DIAGNOSIS — I1310 Hypertensive heart and chronic kidney disease without heart failure, with stage 1 through stage 4 chronic kidney disease, or unspecified chronic kidney disease: Secondary | ICD-10-CM | POA: Diagnosis not present

## 2015-01-07 DIAGNOSIS — I130 Hypertensive heart and chronic kidney disease with heart failure and stage 1 through stage 4 chronic kidney disease, or unspecified chronic kidney disease: Secondary | ICD-10-CM | POA: Diagnosis not present

## 2015-01-07 DIAGNOSIS — I5030 Unspecified diastolic (congestive) heart failure: Secondary | ICD-10-CM | POA: Diagnosis not present

## 2015-01-07 DIAGNOSIS — R531 Weakness: Secondary | ICD-10-CM | POA: Diagnosis not present

## 2015-01-07 DIAGNOSIS — I10 Essential (primary) hypertension: Secondary | ICD-10-CM

## 2015-01-07 DIAGNOSIS — K219 Gastro-esophageal reflux disease without esophagitis: Secondary | ICD-10-CM | POA: Diagnosis present

## 2015-01-07 DIAGNOSIS — N183 Chronic kidney disease, stage 3 unspecified: Secondary | ICD-10-CM

## 2015-01-07 DIAGNOSIS — E1140 Type 2 diabetes mellitus with diabetic neuropathy, unspecified: Secondary | ICD-10-CM | POA: Diagnosis present

## 2015-01-07 DIAGNOSIS — Z96651 Presence of right artificial knee joint: Secondary | ICD-10-CM | POA: Diagnosis present

## 2015-01-07 DIAGNOSIS — N184 Chronic kidney disease, stage 4 (severe): Secondary | ICD-10-CM | POA: Diagnosis present

## 2015-01-07 DIAGNOSIS — I071 Rheumatic tricuspid insufficiency: Secondary | ICD-10-CM

## 2015-01-07 DIAGNOSIS — Z7901 Long term (current) use of anticoagulants: Secondary | ICD-10-CM

## 2015-01-07 DIAGNOSIS — Z8673 Personal history of transient ischemic attack (TIA), and cerebral infarction without residual deficits: Secondary | ICD-10-CM | POA: Diagnosis not present

## 2015-01-07 DIAGNOSIS — Z8249 Family history of ischemic heart disease and other diseases of the circulatory system: Secondary | ICD-10-CM | POA: Diagnosis not present

## 2015-01-07 DIAGNOSIS — I50810 Right heart failure, unspecified: Secondary | ICD-10-CM

## 2015-01-07 DIAGNOSIS — I5033 Acute on chronic diastolic (congestive) heart failure: Secondary | ICD-10-CM

## 2015-01-07 DIAGNOSIS — R339 Retention of urine, unspecified: Secondary | ICD-10-CM

## 2015-01-07 DIAGNOSIS — N19 Unspecified kidney failure: Secondary | ICD-10-CM

## 2015-01-07 DIAGNOSIS — E039 Hypothyroidism, unspecified: Secondary | ICD-10-CM | POA: Diagnosis present

## 2015-01-07 DIAGNOSIS — I480 Paroxysmal atrial fibrillation: Secondary | ICD-10-CM | POA: Diagnosis present

## 2015-01-07 DIAGNOSIS — I050 Rheumatic mitral stenosis: Secondary | ICD-10-CM

## 2015-01-07 DIAGNOSIS — I509 Heart failure, unspecified: Secondary | ICD-10-CM

## 2015-01-07 LAB — BASIC METABOLIC PANEL
Anion gap: 11 (ref 5–15)
BUN: 64 mg/dL — ABNORMAL HIGH (ref 6–23)
CO2: 25 mmol/L (ref 19–32)
Calcium: 9.2 mg/dL (ref 8.4–10.5)
Chloride: 88 mmol/L — ABNORMAL LOW (ref 96–112)
Creatinine, Ser: 1.75 mg/dL — ABNORMAL HIGH (ref 0.50–1.10)
GFR calc non Af Amer: 25 mL/min — ABNORMAL LOW (ref >90)
GFR, EST AFRICAN AMERICAN: 29 mL/min — AB (ref >90)
Glucose, Bld: 128 mg/dL — ABNORMAL HIGH (ref 70–99)
Potassium: 4.4 mmol/L (ref 3.5–5.1)
SODIUM: 124 mmol/L — AB (ref 135–145)

## 2015-01-07 LAB — GLUCOSE, CAPILLARY
GLUCOSE-CAPILLARY: 184 mg/dL — AB (ref 70–99)
GLUCOSE-CAPILLARY: 170 mg/dL — AB (ref 70–99)
GLUCOSE-CAPILLARY: 157 mg/dL — AB (ref 70–99)

## 2015-01-07 LAB — OSMOLALITY: Osmolality: 286 mOsm/kg (ref 275–300)

## 2015-01-07 LAB — URINALYSIS, ROUTINE W REFLEX MICROSCOPIC
BILIRUBIN URINE: NEGATIVE
GLUCOSE, UA: NEGATIVE mg/dL
Hgb urine dipstick: NEGATIVE
KETONES UR: NEGATIVE mg/dL
Leukocytes, UA: NEGATIVE
Nitrite: NEGATIVE
PH: 5 (ref 5.0–8.0)
PROTEIN: NEGATIVE mg/dL
SPECIFIC GRAVITY, URINE: 1.015 (ref 1.005–1.030)
Urobilinogen, UA: 1 mg/dL (ref 0.0–1.0)

## 2015-01-07 LAB — CBC WITH DIFFERENTIAL/PLATELET
BASOS ABS: 0 10*3/uL (ref 0.0–0.1)
Basophils Relative: 0 % (ref 0–1)
Eosinophils Absolute: 0.1 10*3/uL (ref 0.0–0.7)
Eosinophils Relative: 1 % (ref 0–5)
HCT: 34.7 % — ABNORMAL LOW (ref 36.0–46.0)
Hemoglobin: 11.3 g/dL — ABNORMAL LOW (ref 12.0–15.0)
Lymphocytes Relative: 16 % (ref 12–46)
Lymphs Abs: 1.8 10*3/uL (ref 0.7–4.0)
MCH: 31 pg (ref 26.0–34.0)
MCHC: 32.6 g/dL (ref 30.0–36.0)
MCV: 95.3 fL (ref 78.0–100.0)
Monocytes Absolute: 1.1 10*3/uL — ABNORMAL HIGH (ref 0.1–1.0)
Monocytes Relative: 10 % (ref 3–12)
NEUTROS ABS: 8.1 10*3/uL — AB (ref 1.7–7.7)
NEUTROS PCT: 73 % (ref 43–77)
PLATELETS: 191 10*3/uL (ref 150–400)
RBC: 3.64 MIL/uL — AB (ref 3.87–5.11)
RDW: 16.7 % — ABNORMAL HIGH (ref 11.5–15.5)
WBC: 11.1 10*3/uL — AB (ref 4.0–10.5)

## 2015-01-07 LAB — SODIUM, URINE, RANDOM

## 2015-01-07 LAB — CBG MONITORING, ED: Glucose-Capillary: 110 mg/dL — ABNORMAL HIGH (ref 70–99)

## 2015-01-07 LAB — OSMOLALITY, URINE: Osmolality, Ur: 261 mOsm/kg — ABNORMAL LOW (ref 390–1090)

## 2015-01-07 LAB — BRAIN NATRIURETIC PEPTIDE: B Natriuretic Peptide: 1342.9 pg/mL — ABNORMAL HIGH (ref 0.0–100.0)

## 2015-01-07 MED ORDER — HYDROMORPHONE HCL 1 MG/ML IJ SOLN: 0.5000 mg | INTRAMUSCULAR | Status: DC | PRN

## 2015-01-07 MED ORDER — FUROSEMIDE 10 MG/ML IJ SOLN
80.0000 mg | Freq: Once | INTRAMUSCULAR | Status: AC
  Administered 2015-01-07: 80 mg via INTRAVENOUS
  Filled 2015-01-07: qty 8

## 2015-01-07 MED ORDER — ADULT MULTIVITAMIN W/MINERALS CH
1.0000 | ORAL_TABLET | Freq: Every day | ORAL | Status: DC
  Administered 2015-01-07 – 2015-01-11 (×5): 1 via ORAL
  Filled 2015-01-07 (×5): qty 1

## 2015-01-07 MED ORDER — ALUM & MAG HYDROXIDE-SIMETH 200-200-20 MG/5ML PO SUSP
30.0000 mL | Freq: Four times a day (QID) | ORAL | Status: DC | PRN
Start: 2015-01-07 — End: 2015-01-11

## 2015-01-07 MED ORDER — SODIUM CHLORIDE 0.9 % IJ SOLN
3.0000 mL | Freq: Two times a day (BID) | INTRAMUSCULAR | Status: DC
  Administered 2015-01-07 – 2015-01-09 (×3): 3 mL via INTRAVENOUS

## 2015-01-07 MED ORDER — FUROSEMIDE 10 MG/ML IJ SOLN: 40.0000 mg | Freq: Two times a day (BID) | INTRAMUSCULAR | Status: DC

## 2015-01-07 MED ORDER — CADEXOMER IODINE 0.9 % EX GEL
1.0000 "application " | CUTANEOUS | Status: DC
  Administered 2015-01-08 – 2015-01-10 (×2): 1 via TOPICAL

## 2015-01-07 MED ORDER — ONDANSETRON HCL 4 MG/2ML IJ SOLN: 4.0000 mg | Freq: Four times a day (QID) | INTRAMUSCULAR | Status: DC | PRN

## 2015-01-07 MED ORDER — MILRINONE IN DEXTROSE 20 MG/100ML IV SOLN
0.2500 ug/kg/min | INTRAVENOUS | Status: DC
  Administered 2015-01-07 – 2015-01-08 (×2): 0.25 ug/kg/min via INTRAVENOUS
  Filled 2015-01-07 (×2): qty 100

## 2015-01-07 MED ORDER — FUROSEMIDE 10 MG/ML IJ SOLN
80.0000 mg | Freq: Two times a day (BID) | INTRAMUSCULAR | Status: DC
  Administered 2015-01-07 – 2015-01-11 (×8): 80 mg via INTRAVENOUS
  Filled 2015-01-07 (×12): qty 8

## 2015-01-07 MED ORDER — OXYCODONE HCL 5 MG PO TABS
5.0000 mg | ORAL_TABLET | ORAL | Status: DC | PRN
  Administered 2015-01-11: 5 mg via ORAL
  Filled 2015-01-07: qty 1

## 2015-01-07 MED ORDER — CALCIUM CARBONATE-VITAMIN D 500-200 MG-UNIT PO TABS
1.0000 | ORAL_TABLET | Freq: Every day | ORAL | Status: DC
  Administered 2015-01-07 – 2015-01-11 (×5): 1 via ORAL
  Filled 2015-01-07 (×7): qty 1

## 2015-01-07 MED ORDER — ACETAMINOPHEN 325 MG PO TABS
650.0000 mg | ORAL_TABLET | Freq: Four times a day (QID) | ORAL | Status: DC | PRN
  Administered 2015-01-07 – 2015-01-10 (×4): 650 mg via ORAL
  Filled 2015-01-07 (×4): qty 2

## 2015-01-07 MED ORDER — SODIUM CHLORIDE 0.9 % IJ SOLN
3.0000 mL | Freq: Two times a day (BID) | INTRAMUSCULAR | Status: DC
  Administered 2015-01-07 – 2015-01-11 (×7): 3 mL via INTRAVENOUS

## 2015-01-07 MED ORDER — SILVER SULFADIAZINE 1 % EX CREA
1.0000 "application " | TOPICAL_CREAM | CUTANEOUS | Status: DC
  Administered 2015-01-07 – 2015-01-11 (×3): 1 via TOPICAL
  Filled 2015-01-07: qty 85

## 2015-01-07 MED ORDER — LEVOTHYROXINE SODIUM 112 MCG PO TABS
112.0000 ug | ORAL_TABLET | Freq: Every day | ORAL | Status: DC
  Administered 2015-01-07 – 2015-01-11 (×5): 112 ug via ORAL
  Filled 2015-01-07 (×6): qty 1

## 2015-01-07 MED ORDER — ONDANSETRON HCL 4 MG PO TABS: 4.0000 mg | ORAL_TABLET | Freq: Four times a day (QID) | ORAL | Status: DC | PRN

## 2015-01-07 MED ORDER — ALLOPURINOL 300 MG PO TABS
300.0000 mg | ORAL_TABLET | Freq: Every day | ORAL | Status: DC
  Administered 2015-01-07 – 2015-01-11 (×5): 300 mg via ORAL
  Filled 2015-01-07 (×5): qty 1

## 2015-01-07 MED ORDER — ATORVASTATIN CALCIUM 10 MG PO TABS
10.0000 mg | ORAL_TABLET | Freq: Every day | ORAL | Status: DC
  Administered 2015-01-07 – 2015-01-10 (×4): 10 mg via ORAL
  Filled 2015-01-07 (×5): qty 1

## 2015-01-07 MED ORDER — AMIODARONE HCL 200 MG PO TABS
200.0000 mg | ORAL_TABLET | Freq: Two times a day (BID) | ORAL | Status: DC
  Administered 2015-01-07 (×2): 200 mg via ORAL
  Filled 2015-01-07 (×2): qty 1

## 2015-01-07 MED ORDER — SODIUM CHLORIDE 0.9 % IJ SOLN: 3.0000 mL | INTRAMUSCULAR | Status: DC | PRN

## 2015-01-07 MED ORDER — SODIUM CHLORIDE 0.9 % IV SOLN: 250.0000 mL | INTRAVENOUS | Status: DC | PRN

## 2015-01-07 MED ORDER — APIXABAN 2.5 MG PO TABS
2.5000 mg | ORAL_TABLET | Freq: Two times a day (BID) | ORAL | Status: DC
  Administered 2015-01-07 – 2015-01-11 (×9): 2.5 mg via ORAL
  Filled 2015-01-07 (×10): qty 1

## 2015-01-07 MED ORDER — GABAPENTIN 300 MG PO CAPS
300.0000 mg | ORAL_CAPSULE | Freq: Every day | ORAL | Status: DC
  Administered 2015-01-07 – 2015-01-10 (×4): 300 mg via ORAL
  Filled 2015-01-07 (×5): qty 1

## 2015-01-07 MED ORDER — ACETAMINOPHEN 650 MG RE SUPP: 650.0000 mg | Freq: Four times a day (QID) | RECTAL | Status: DC | PRN

## 2015-01-07 NOTE — H&P (Addendum)
[facility] Admission History and Physical       [name] ZOX:[id number] DOB: 1924-10-11 DOA: 01/07/2015  Referring physician: EDP PCP: Ginette Otto, MD  Specialists:   Chief Complaint: Weakness  HPI: [name] is a 81 y.o. female with a history of Diastolic CHF, Atrial Fibrillation, Aortic Stenosis, HTN, DM2, Hyperlipidemia, PVD who was brought to the ED due to complaints of increased weakness and fatigue and edema of both lower legs.  She denies having any chest pain or SOB, she has had increased weakness and difficulty walking due to her increased edema.   She was discharged from the hospital 2 days ago for CHF,  and  Had follow up with her cardiologist today and her lasix rx was increased to 80 mg but she reports that she had very little urine output from the dose.  In the ED this AM,  she was found to have a sodium level of 124.      Review of Systems:  Constitutional: No Weight Loss, No Weight Gain, Night Sweats, Fevers, Chills, Dizziness, Light Headedness, Fatigue, +Generalized Weakness HEENT: No Headaches, Difficulty Swallowing,Tooth/Dental Problems,Sore Throat,  No Sneezing, Rhinitis, Ear Ache, Nasal Congestion, or Post Nasal Drip,  Cardio-vascular:  No Chest pain, Orthopnea, PND, +Edema in Lower Extremities, Anasarca, Dizziness, Palpitations  Resp: No Dyspnea, No DOE, No Productive Cough, No Non-Productive Cough, No Hemoptysis, No Wheezing.    GI: No Heartburn, Indigestion, Abdominal Pain, Nausea, Vomiting, Diarrhea, Constipation, Hematemesis, Hematochezia, Melena, Change in Bowel Habits,  Loss of Appetite  GU: No Dysuria, No Change in Color of Urine, No Urgency or Urinary Frequency, No Flank pain.  Musculoskeletal: No Joint Pain or Swelling, No Decreased Range of Motion, No Back Pain.  Neurologic: No Syncope, No Seizures, Muscle Weakness, Paresthesia, Vision Disturbance or Loss, No Diplopia, No Vertigo, No Difficulty Walking,  Skin: No Rash or  Lesions. Psych: No Change in Mood or Affect, No Depression or Anxiety, No Memory loss, No Confusion, or Hallucinations   Past Medical History  Diagnosis Date  . Hypertension   . Peripheral neuropathy   . Asthma   . High cholesterol   . Diabetes with neurologic complications   . PAD (peripheral artery disease)   . Enlarged heart   . Anginal pain 05/28/12  . Pneumonia 11/2010  . Sleep apnea     cpap  . Hypothyroidism   . Headache(784.0) 05/29/12    "recently"  . Stroke 05/2005; 08/2010    !mini"  . Personal history of gout   . Hypothyroidism   . DJD (degenerative joint disease)   . Carpal tunnel syndrome, bilateral   . Recurrent labyrinthitis   . GERD (gastroesophageal reflux disease)     S/P esophageal dilation in 1996  . Gallstone     Silent  . Fibrocystic breast changes   . Hemorrhoids     with bleeding  . Hx of adenomatous colonic polyps     Dr Sherin Quarry  . Diverticulitis     left side  . MR (mitral regurgitation)     moderate  . Mild anemia     hemoglobin 11.8 on 06/2009  . Edema of lower extremity     Chronic  . Ischemic colitis 8/13  . Aortic stenosis, moderate   . Chronic diastolic CHF (congestive heart failure)   . Moderate mitral regurgitation   . CKD (chronic kidney disease) stage 3, GFR 30-59 ml/min     "something on labs always say she has some problems"  .  Chronic atrial fibrillation   . Peripheral arterial disease     critical limb ischemia, right third toe     Past Surgical History  Procedure Laterality Date  . Tonsillectomy and adenoidectomy  1960's  . Dilation and curettage of uterus    . Vaginal hysterectomy  1970's  . Total knee arthroplasty  1970-~2002    left; right  . Cataract extraction w/ intraocular lens  implant, bilateral  ?1970's  . Kyphoplasty N/A     L2      Prior to Admission medications   Medication Sig Start Date End Date Taking? Authorizing Provider  acetaminophen (TYLENOL) 500 MG tablet Take 500 mg by mouth at bedtime.  Give with the Tramadol 50 mg   Yes Historical Provider, MD  allopurinol (ZYLOPRIM) 300 MG tablet Take 1 tablet (300 mg total) by mouth daily. 01/30/14  Yes Daniel J Angiulli, PA-C  amiodarone (PACERONE) 200 MG tablet Take 1 tablet (200 mg total) by mouth 2 (two) times daily. 01/04/15  Yes Belkys A Regalado, MD  apixaban (ELIQUIS) 2.5 MG TABS tablet Take 1 tablet (2.5 mg total) by mouth 2 (two) times daily. 02/08/14  Yes Shanker Levora DredgeM Ghimire, MD  atorvastatin (LIPITOR) 10 MG tablet Take 1 tablet (10 mg total) by mouth daily. Patient taking differently: Take 10 mg by mouth daily at 6 PM.  01/30/14  Yes Mcarthur Rossettianiel J Angiulli, PA-C  cadexomer iodine (IODOSORB) 0.9 % gel Apply 1 application topically daily. For wound dressing on toe. Alternate days used with silver sulfadiazine cream.   Yes Historical Provider, MD  Calcium Carbonate-Vitamin D (CALCIUM-VITAMIN D) 600-200 MG-UNIT CAPS Take 1 tablet by mouth daily.    Yes Historical Provider, MD  furosemide (LASIX) 40 MG tablet Take two tablets (80 mg) by mouth twice daily 01/06/15  Yes Dwana MelenaBryan W Hager, PA-C  gabapentin (NEURONTIN) 300 MG capsule Take 1 capsule (300 mg total) by mouth at bedtime. 01/30/14  Yes Daniel J Angiulli, PA-C  ketoconazole (NIZORAL) 2 % cream  10/21/14  Yes Historical Provider, MD  levothyroxine (SYNTHROID, LEVOTHROID) 112 MCG tablet Take 112 mcg by mouth daily before breakfast.   Yes Historical Provider, MD  metoprolol succinate (TOPROL-XL) 25 MG 24 hr tablet Take 0.5 tablets (12.5 mg total) by mouth daily. 01/30/14  Yes Daniel J Angiulli, PA-C  Multiple Vitamins-Minerals (MULTIVITAMIN WITH MINERALS) tablet Take 1 tablet by mouth daily.   Yes Historical Provider, MD  silver sulfADIAZINE (SILVADENE) 1 % cream Apply 1 application topically daily. Patient taking differently: Apply 1 application topically every other day.  12/18/14  Yes Delories HeinzKathryn P Egerton, DPM  traMADol (ULTRAM) 50 MG tablet Take 1 tablet by mouth every 8 (eight) hours as needed. For pain  11/10/14  Yes Historical Provider, MD  ONE TOUCH ULTRA TEST test strip 1 each by Other route See admin instructions. Check blood sugar once daily. 05/15/14   Historical Provider, MD     Allergies  Allergen Reactions  . Ambien [Zolpidem Tartrate] Other (See Comments)    Pt does not wakeup from [place]  . Aspirin Other (See Comments)    Tears my stomach up; "can take coated Aspirin qd without problems"  . Nexium [Esomeprazole Magnesium] Rash  . Penicillins Rash and Other (See Comments)    Tolerates Rocephin    Social History:  reports that she has never smoked. She has never used smokeless tobacco. She reports that she does not drink alcohol or use illicit drugs.    Family History  Problem Relation Age of  Onset  . Heart disease Mother   . Heart disease Father   . Heart disease Brother        Physical Exam:  GEN:  Pleasant Elderly Obese 81 y.o. Caucasian female examined and in no acute distress; cooperative with exam Filed Vitals:   01/07/15 0400 01/07/15 0430 01/07/15 0445 01/07/15 0500  BP:  105/57 87/57 98/64   Pulse:   78 77  Temp:      TempSrc:      Resp: Height:      Weight:      SpO2: 100%  100% 99%   Blood pressure 98/64, pulse 77, temperature 97.3 F (36.3 C), temperature source Oral, resp. rate 13, height  (1.702 m), weight 73.483 kg (162 lb), SpO2 99 %. PSYCH: She is alert and oriented x4; does not appear anxious does not appear depressed; affect is normal HEENT: Normocephalic and Atraumatic, Mucous membranes pink; PERRLA; EOM intact; Fundi:  Benign;  No scleral icterus, Nares: Patent, Oropharynx: Clear, Fair Dentition,    Neck:  FROM, No Cervical Lymphadenopathy nor Thyromegaly or Carotid Bruit; No JVD; Breasts:: Not examined CHEST WALL: No tenderness CHEST: Normal respiration, clear to auscultation bilaterally HEART: Regular rate and rhythm; no murmurs rubs or gallops BACK: No kyphosis or scoliosis; No CVA tenderness ABDOMEN: Positive Bowel  Sounds,  Obese, Soft Non-Tender, No Rebound or Guarding; No Masses, No Organomegaly. Rectal Exam: Not done EXTREMITIES: No Cyanosis, Clubbing, 2+Edema to BLEs; +Ulcerations of Right 3rd Toe. Genitalia: not examined PULSES: 2+ and symmetric SKIN: Normal hydration no rash or ulceration  CNS:  Alert and Oriented x 4, No Focal Deficits Vascular: pulses palpable throughout    Labs on Admission:  Basic Metabolic Panel:  Recent Labs Lab 01/02/15 0821 01/03/15 0436 01/04/15 0458 01/06/15 1404 01/07/15 0240  NA 133* 128* 128* 124* 124*  K 3.7 3.6 4.4 4.3 4.4  CL 95* 95* 95* 90* 88*  CO2 GLUCOSE 104* 104* 101* 186* 128*  BUN 40* 45* 53* 63* 64*  CREATININE 1.27* 1.30* 1.58* 1.63* 1.75*  CALCIUM 8.5 8.1* 8.2* 9.0 9.2   Liver Function Tests: No results for input(s): AST, ALT, ALKPHOS, BILITOT, PROT, ALBUMIN in the last 168 hours. No results for input(s): LIPASE, AMYLASE in the last 168 hours. No results for input(s): AMMONIA in the last 168 hours. CBC:  Recent Labs Lab 01/01/15 0345 01/02/15 0821 01/03/15 0436 01/07/15 0240  WBC 10.9* 11.1* 10.2 11.1*  NEUTROABS  --   --   --  8.1*  HGB 10.3* 10.7* 10.4* 11.3*  HCT 31.3* 33.3* 32.2* 34.7*  MCV 96.9 96.2 96.4 95.3  PLT 178 184 177 191   Cardiac Enzymes: No results for input(s): CKTOTAL, CKMB, CKMBINDEX, TROPONINI in the last 168 hours.  BNP (last 3 results)  Recent Labs  12/27/14 0344 12/30/14 0924 01/07/15 0240  BNP 1339.0* 1176.8* 1342.9*    ProBNP (last 3 results)  Recent Labs  02/12/14 1421 07/22/14 1213 01/06/15 1404  PROBNP 1542.0* 1209.0* 1412.0*    CBG:  Recent Labs Lab 01/03/15 1701 01/03/15 2103 01/04/15 0547 01/04/15 1116 01/07/15 0144  GLUCAP 180* 167* 106* 144* 110*    Radiological Exams on Admission: No results found.   EKG: Independently reviewed. Normal sinus rhythm rate 77 +RBBB       Assessment/Plan:     81 y.o. female with  Principal Problem:   1.    Hyponatremia with excess extracellular fluid volume  IV Lasix given    Add Metolazone Rx   Monitor Na+ levels   Active Problems:     2.   Diastolic heart failure, NYHA class 3   Acute CHF Protocol    And diurese with Lasix     3.   Chronic atrial fibrillation   Continue Amiodarone Rx   Continue Eliquis Rx     4.   Hypothyroidism   Continue Levothyroxine   Check TSH     5.   Diabetes with neurologic complications   SSI coverage PRN   Check HbA1C     6.  HTN (hypertension)   Continue Metoprolol, and Lasix Rx       7.  Urinary retention   Foley Temporarily     8.  CKD (chronic kidney disease) stage 3, GFR 30-59 ml/min     9.   PAD  Nonhealing Wound Right 3rd Toe   Continue Wound Care   10.   Other-   Social work or Case Corporate investment banker for ALF or SNF placement    11.  DVT Prophylaxis   On Eliquis Rx      Code Status:    DO NOT RESUSCITATE (DNR)      Family Communication:   Family at Bedside      Disposition Plan:    Inpatient  Status        Time spent:  45 Minutes      Ron Parker [facility] Pager [phone]   If 7AM -7PM Please Contact the Day Rounding Team MD for [facility]  If 7PM-7AM, Please Contact Night-Floor Coverage  [email or web address] Password [email or web address] 01/07/2015, 5:31 AM     ADDENDUM:   Patient was seen and examined on 01/07/2015

## 2015-01-07 NOTE — ED Provider Notes (Signed)
CSN: [id number]     Arrival date & time 01/07/15  0124 History  This chart was scribed for Judith Severinlga Kamyrah Feeser, MD by Annye AsaAnna Dorsett, ED Scribe. This patient was seen in room B15C/B15C and the patient's care was started at 2:19 AM.    Chief Complaint  Patient presents with  . Urinary Retention   The history is provided by the patient and a relative. No language interpreter was used.     HPI Comments: Moody BruinsClarice V Mertens is a 81 y.o. female with past medical history of HTN, HLD, DM, PAD, CHF, enlarged heart, mitral regurgitation, aortic stenosis, a-fib, anginal pain, CKD stage III who presents to the Emergency Department complaining of urinary retention. Family reports that last full urine stream was this morning, with only a small amount released tonight, described as "several drops" that were "thicker" or more viscous than normal. They also note wheezing and decreased circulation to the hands and feet. She denies fever, chills, chest pain, abdominal pain.   [facility]; she was seen by cardiology today, who was concerned about fluid overload. She is not on antibiotics at this time. Patient regularly takes Eliquis. She ambulates at home with a walker or wheelchair.   Past Medical History  Diagnosis Date  . Hypertension   . Peripheral neuropathy   . Asthma   . High cholesterol   . Diabetes with neurologic complications   . PAD (peripheral artery disease)   . Enlarged heart   . Anginal pain 05/28/12  . Pneumonia 11/2010  . Sleep apnea     cpap  . Hypothyroidism   . Headache(784.0) 05/29/12    "recently"  . Stroke 05/2005; 08/2010    !mini"  . Personal history of gout   . Hypothyroidism   . DJD (degenerative joint disease)   . Carpal tunnel syndrome, bilateral   . Recurrent labyrinthitis   . GERD (gastroesophageal reflux disease)     S/P esophageal dilation in 1996  . Gallstone     Silent  . Fibrocystic breast changes   . Hemorrhoids     with bleeding  . Hx of  adenomatous colonic polyps     Dr Sherin QuarryWeissman  . Diverticulitis     left side  . MR (mitral regurgitation)     moderate  . Mild anemia     hemoglobin 11.8 on 06/2009  . Edema of lower extremity     Chronic  . Ischemic colitis 8/13  . Aortic stenosis, moderate   . Chronic diastolic CHF (congestive heart failure)   . Moderate mitral regurgitation   . CKD (chronic kidney disease) stage 3, GFR 30-59 ml/min     "something on labs always say she has some problems"  . Chronic atrial fibrillation   . Peripheral arterial disease     critical limb ischemia, right third toe   Past Surgical History  Procedure Laterality Date  . Tonsillectomy and adenoidectomy  1960's  . Dilation and curettage of uterus    . Vaginal hysterectomy  1970's  . Total knee arthroplasty  1970-~2002    left; right  . Cataract extraction w/ intraocular lens  implant, bilateral  ?1970's  . Kyphoplasty N/A     L2   Family History  Problem Relation Age of Onset  . Heart disease Mother   . Heart disease Father   . Heart disease Brother    History  Substance Use Topics  . Smoking status: Never Smoker   . Smokeless tobacco:  Never Used  . Alcohol Use: No   OB History    No data available     Review of Systems  Constitutional: Negative for fever and chills.  Respiratory: Positive for wheezing.   Cardiovascular: Negative for chest pain.  Gastrointestinal: Negative for abdominal pain.  Genitourinary: Positive for decreased urine volume and difficulty urinating.  All other systems reviewed and are negative.  Allergies  Ambien; Aspirin; Nexium; and Penicillins  Home Medications   Prior to Admission medications   Medication Sig Start Date End Date Taking? Authorizing Provider  acetaminophen (TYLENOL) 500 MG tablet Take 500 mg by mouth at bedtime. Give with the Tramadol 50 mg    Historical Provider, MD  allopurinol (ZYLOPRIM) 300 MG tablet Take 1 tablet (300 mg total) by mouth daily. 01/30/14   Mcarthur Rossetti  Angiulli, PA-C  amiodarone (PACERONE) 200 MG tablet Take 1 tablet (200 mg total) by mouth 2 (two) times daily. 01/04/15   Belkys A Regalado, MD  apixaban (ELIQUIS) 2.5 MG TABS tablet Take 1 tablet (2.5 mg total) by mouth 2 (two) times daily. 02/08/14   Shanker Levora Dredge, MD  atorvastatin (LIPITOR) 10 MG tablet Take 1 tablet (10 mg total) by mouth daily. Patient taking differently: Take 10 mg by mouth daily at 6 PM.  01/30/14   Mcarthur Rossetti Angiulli, PA-C  Calcium Carbonate-Vitamin D (CALCIUM-VITAMIN D) 600-200 MG-UNIT CAPS Take 1 tablet by mouth daily.     Historical Provider, MD  furosemide (LASIX) 40 MG tablet Take two tablets (80 mg) by mouth twice daily 01/06/15   Dwana Melena, PA-C  gabapentin (NEURONTIN) 300 MG capsule Take 1 capsule (300 mg total) by mouth at bedtime. 01/30/14   Mcarthur Rossetti Angiulli, PA-C  ketoconazole (NIZORAL) 2 % cream  10/21/14   Historical Provider, MD  levothyroxine (SYNTHROID, LEVOTHROID) 112 MCG tablet Take 112 mcg by mouth daily before breakfast.    Historical Provider, MD  metoprolol succinate (TOPROL-XL) 25 MG 24 hr tablet Take 0.5 tablets (12.5 mg total) by mouth daily. 01/30/14   Mcarthur Rossetti Angiulli, PA-C  Multiple Vitamins-Minerals (MULTIVITAMIN WITH MINERALS) tablet Take 1 tablet by mouth daily.    Historical Provider, MD  ONE TOUCH ULTRA TEST test strip 1 each by Other route See admin instructions. Check blood sugar once daily. 05/15/14   Historical Provider, MD  PRESCRIPTION MEDICATION Apply 1 application topically every other day. Idosorb    Historical Provider, MD  silver sulfADIAZINE (SILVADENE) 1 % cream Apply 1 application topically daily. Patient taking differently: Apply 1 application topically every other day.  12/18/14   Orlie Pollen Egerton, DPM   BP 110/68 mmHg  Pulse 69  Temp(Src) 97.3 F (36.3 C) (Oral)  Resp 18  Ht  (1.702 m)  Wt 162 lb (73.483 kg)  BMI 25.37 kg/m2  SpO2 93% Physical Exam  Constitutional: She is oriented to person, place, and time. She  appears well-developed and well-nourished. No distress.  HENT:  Head: Normocephalic and atraumatic.  Mouth/Throat: Oropharynx is clear and moist. No oropharyngeal exudate.  Moist mucous membranes  Eyes: EOM are normal. Pupils are equal, round, and reactive to light.  Neck: Normal range of motion. Neck supple. No JVD present.  Cardiovascular: Normal rate and regular rhythm.  Exam reveals no gallop and no friction rub.   Murmur heard. Pulmonary/Chest: Effort normal. No respiratory distress. She has no wheezes. She has rales (In bases).  Abdominal: Soft. Bowel sounds are normal. She exhibits no mass. There is no tenderness. There  is no rebound and no guarding.  Musculoskeletal: Normal range of motion. She exhibits edema (3+ pitting edema to above the knee).  Moves all extremities normally.   Lymphadenopathy:    She has no cervical adenopathy.  Neurological: She is alert and oriented to person, place, and time. She displays normal reflexes.  Skin: Skin is warm and dry. No rash noted.  Psychiatric: She has a normal mood and affect. Her behavior is normal.  Nursing note and vitals reviewed.   ED Course  Procedures   DIAGNOSTIC STUDIES: Oxygen Saturation is 93% on RA, low by my interpretation.    COORDINATION OF CARE: 2:27 AM Discussed treatment plan with pt at bedside and pt agreed to plan.  Labs Review Labs Reviewed  CBC WITH DIFFERENTIAL/PLATELET - Abnormal; Notable for the following:    WBC 11.1 (*)    RBC 3.64 (*)    Hemoglobin 11.3 (*)    HCT 34.7 (*)    RDW 16.7 (*)    Neutro Abs 8.1 (*)    Monocytes Absolute 1.1 (*)    All other components within normal limits  BASIC METABOLIC PANEL - Abnormal; Notable for the following:    Sodium 124 (*)    Chloride 88 (*)    Glucose, Bld 128 (*)    BUN 64 (*)    Creatinine, Ser 1.75 (*)    GFR calc non Af Amer 25 (*)    GFR calc Af Amer 29 (*)    All other components within normal limits  BRAIN NATRIURETIC PEPTIDE - Abnormal;  Notable for the following:    B Natriuretic Peptide 1342.9 (*)    All other components within normal limits  CBG MONITORING, ED - Abnormal; Notable for the following:    Glucose-Capillary 110 (*)    All other components within normal limits  URINALYSIS, ROUTINE W REFLEX MICROSCOPIC    Imaging Review No results found.   EKG Interpretation None      MDM   Final diagnoses:  Diastolic CHF, acute on chronic  Urinary retention  Hyponatremia with excess extracellular fluid volume   81 year old female with urinary retention since this morning, fluid overload on exam.  Recent discharge for acute on chronic CHF exacerbation.  Family is also concerned as she is persistently weak.  It is having difficulties getting around, it may need skilled nursing care.  Bladder scan with around 2 50 cc.  Foley catheter placed with about 200 cc of urine.  Patient has had not much more urine despite 80 mg of Lasix twice today.  Hyponatremia noted, most likely pseudohyponatremia due to fluid overload, also with rising BNP.  Plan for IV Lasix and admission to the hospital.  Plan was discussed with patient and family who are agreeable.  Patient does not have any respiratory complaints at this time, no signs of pulmonary edema.  I personally performed the services described in this documentation, which was scribed in my presence. The recorded information has been reviewed and is accurate.       Judith Severin, MD 01/07/15 [phone]

## 2015-01-07 NOTE — Consult Note (Signed)
Advanced Heart Failure Team Consult Note  Referring Physician: Dr Randol KernElgergawy Primary Physician:   Reason for Consultation: Heart Failure   HPI:   Ms [name] is an 81 year old with history of of hypertension, chronic A fib on eliquis , hyperlipidemia , diabetes, peripheral artery disease , sleep apnea hypothyroidism , stroke , moderate aortic stenosis, severe tricuspid regurgitation, pulmonary hypertension admitted with increased dyspnea and weight gain.   Discharged a couple of days ago after being treated for UTI and volume overload. Weight was unchanged on discharge, 162 pounds. Home lasix 80 mg twice a day.  Evaluated yesterday by cardiology she was continued on lasix 80 mg twice a day and toprol was stopped. She was noted to have 3+ lower extremity edema. Early this am she admitted with increased dyspnea and lower extremity edema. Weight at home had gone up to 167 pounds.   ECHO 12/28/2014 EF 60-65% Moderate AS/MS/MR.  RV dilated and hypokinetic. Severe TR. RVSP 61mmHG  Pertinent admission labs include: Pro BNP 1342 K 4.4 Creatinine 1.75 WBC 11 Hgb 11.3   SH: lives at home and has 24 hour caregivers.    Review of Systems: [y] = yes, [ ]  = no   General: Weight gain [ ] ; Weight loss [ ] ; Anorexia [ ] ; Fatigue [Y; Fever [ ] ; Chills [ ] ; Weakness [Y  ]  Cardiac: Chest pain/pressure [ ] ; Resting SOB [Y ]; Exertional SOB [ Y]; Orthopnea [ ] ; Pedal Edema [Y ]; Palpitations [ ] ; Syncope [ ] ; Presyncope [ ] ; Paroxysmal nocturnal dyspnea[ ]   Pulmonary: Cough [ ] ; Wheezing[ ] ; Hemoptysis[ ] ; Sputum [ ] ; Snoring [ ]   GI: Vomiting[ ] ; Dysphagia[ ] ; Melena[ ] ; Hematochezia [ ] ; Heartburn[ ] ; Abdominal pain [ ] ; Constipation [ ] ; Diarrhea [ ] ; BRBPR [ ]   GU: Hematuria[ ] ; Dysuria [ ] ; Nocturia[ ]   Vascular: Pain in legs with walking [ ] ; Pain in feet with lying flat [ ] ; Non-healing sores [ ] ; Stroke [ ] ; TIA [ ] ; Slurred speech [ ] ;  Neuro: Headaches[ ] ; Vertigo[ ] ; Seizures[ ] ; Paresthesias[  ];Blurred vision [ ] ; Diplopia [ ] ; Vision changes [ ]   Ortho/Skin: Arthritis [ ] ; Joint pain [Y  ]; Muscle pain [ ] ; Joint swelling [ ] ; Back Pain [ ] ; Rash [ ]   Psych: Depression[ ] ; Anxiety[ ]   Heme: Bleeding problems [ ] ; Clotting disorders [ ] ; Anemia [ ]   Endocrine: Diabetes [Y ]; Thyroid dysfunction[ ]   Home Medications Prior to Admission medications   Medication Sig Start Date End Date Taking? Authorizing Provider  acetaminophen (TYLENOL) 500 MG tablet Take 500 mg by mouth at bedtime. Give with the Tramadol 50 mg   Yes Historical Provider, MD  allopurinol (ZYLOPRIM) 300 MG tablet Take 1 tablet (300 mg total) by mouth daily. 01/30/14  Yes Kasch Borquez J Angiulli, PA-C  amiodarone (PACERONE) 200 MG tablet Take 1 tablet (200 mg total) by mouth 2 (two) times daily. 01/04/15  Yes Belkys A Regalado, MD  apixaban (ELIQUIS) 2.5 MG TABS tablet Take 1 tablet (2.5 mg total) by mouth 2 (two) times daily. 02/08/14  Yes Shanker Levora DredgeM Ghimire, MD  atorvastatin (LIPITOR) 10 MG tablet Take 1 tablet (10 mg total) by mouth daily. Patient taking differently: Take 10 mg by mouth daily at 6 PM.  01/30/14  Yes Mcarthur Rossettianiel J Angiulli, PA-C  cadexomer iodine (IODOSORB) 0.9 % gel Apply 1 application topically daily. For wound dressing on toe. Alternate days used with silver sulfadiazine cream.   Yes Historical  Provider, MD  Calcium Carbonate-Vitamin D (CALCIUM-VITAMIN D) 600-200 MG-UNIT CAPS Take 1 tablet by mouth daily.    Yes Historical Provider, MD  furosemide (LASIX) 40 MG tablet Take two tablets (80 mg) by mouth twice daily 01/06/15  Yes Dwana Melena, PA-C  gabapentin (NEURONTIN) 300 MG capsule Take 1 capsule (300 mg total) by mouth at bedtime. 01/30/14  Yes Vergie Zahm J Angiulli, PA-C  ketoconazole (NIZORAL) 2 % cream  10/21/14  Yes Historical Provider, MD  levothyroxine (SYNTHROID, LEVOTHROID) 112 MCG tablet Take 112 mcg by mouth daily before breakfast.   Yes Historical Provider, MD  metoprolol succinate (TOPROL-XL) 25 MG 24 hr tablet  Take 0.5 tablets (12.5 mg total) by mouth daily. 01/30/14  Yes Nelida Mandarino J Angiulli, PA-C  Multiple Vitamins-Minerals (MULTIVITAMIN WITH MINERALS) tablet Take 1 tablet by mouth daily.   Yes Historical Provider, MD  silver sulfADIAZINE (SILVADENE) 1 % cream Apply 1 application topically daily. Patient taking differently: Apply 1 application topically every other day.  12/18/14  Yes Delories Heinz, DPM  traMADol (ULTRAM) 50 MG tablet Take 1 tablet by mouth every 8 (eight) hours as needed. For pain 11/10/14  Yes Historical Provider, MD  ONE TOUCH ULTRA TEST test strip 1 each by Other route See admin instructions. Check blood sugar once daily. 05/15/14   Historical Provider, MD    Past Medical History: Past Medical History  Diagnosis Date  . Hypertension   . Peripheral neuropathy   . Asthma   . High cholesterol   . Diabetes with neurologic complications   . PAD (peripheral artery disease)   . Enlarged heart   . Anginal pain 05/28/12  . Pneumonia 11/2010  . Sleep apnea     cpap  . Hypothyroidism   . Headache(784.0) 05/29/12    "recently"  . Stroke 05/2005; 08/2010    !mini"  . Personal history of gout   . Hypothyroidism   . DJD (degenerative joint disease)   . Carpal tunnel syndrome, bilateral   . Recurrent labyrinthitis   . GERD (gastroesophageal reflux disease)     S/P esophageal dilation in 1996  . Gallstone     Silent  . Fibrocystic breast changes   . Hemorrhoids     with bleeding  . Hx of adenomatous colonic polyps     Dr Sherin Quarry  . Diverticulitis     left side  . MR (mitral regurgitation)     moderate  . Mild anemia     hemoglobin 11.8 on 06/2009  . Edema of lower extremity     Chronic  . Ischemic colitis 8/13  . Aortic stenosis, moderate   . Chronic diastolic CHF (congestive heart failure)   . Moderate mitral regurgitation   . CKD (chronic kidney disease) stage 3, GFR 30-59 ml/min     "something on labs always say she has some problems"  . Chronic atrial fibrillation    . Peripheral arterial disease     critical limb ischemia, right third toe    Past Surgical History: Past Surgical History  Procedure Laterality Date  . Tonsillectomy and adenoidectomy  1960's  . Dilation and curettage of uterus    . Vaginal hysterectomy  1970's  . Total knee arthroplasty  1970-~2002    left; right  . Cataract extraction w/ intraocular lens  implant, bilateral  ?1970's  . Kyphoplasty N/A     L2    Family History: Family History  Problem Relation Age of Onset  . Heart disease Mother   .  Heart disease Father   . Heart disease Brother     Social History: History   Social History  . Marital Status: Widowed    Spouse Name: N/A  . Number of Children: 3  . Years of Education: 8th   Occupational History  . RETIRED     [employer]   Social History Main Topics  . Smoking status: Never Smoker   . Smokeless tobacco: Never Used  . Alcohol Use: No  . Drug Use: No  . Sexual Activity: No   Other Topics Concern  . None   Social History Narrative   Retired. Lives alone. 3 adult children. No tobacco use. Nondrinker. No illicit drug use.    Caffeine Use: none    Allergies:  Allergies  Allergen Reactions  . Ambien [Zolpidem Tartrate] Other (See Comments)    Pt does not wakeup from [place]  . Aspirin Other (See Comments)    Tears my stomach up; "can take coated Aspirin qd without problems"  . Nexium [Esomeprazole Magnesium] Rash  . Penicillins Rash and Other (See Comments)    Tolerates Rocephin    Objective:    Vital Signs:   Temp:  [95 F (35 C)-97.3 F (36.3 C)] 95 F (35 C) (03/10 0550) Pulse Rate:  [62-81] 81 (03/10 0550) Resp:  [11-18] 16 (03/10 0550) BP: (87-110)/(55-68) 100/55 mmHg (03/10 0550) SpO2:  [93 %-100 %] 99 % (03/10 0550) Weight:  [162 lb (73.483 kg)-170 lb 6.7 oz (77.3 kg)] 170 lb 6.7 oz (77.3 kg) (03/10 0550) Last BM Date: 01/07/15  Weight change: Filed Weights   01/07/15 0130 01/07/15 0550  Weight: 162 lb (73.483 kg) 170 lb  6.7 oz (77.3 kg)    Intake/Output:   Intake/Output Summary (Last 24 hours) at 01/07/15 1325 Last data filed at 01/07/15 0944  Gross per 24 hour  Intake    600 ml  Output    500 ml  Net    100 ml     Physical Exam: General:  Elderly. In bed.  No resp difficulty HEENT: normal Neck: supple. JVP to ear . Carotids 2+ bilat; no bruits. No lymphadenopathy or thryomegaly appreciated. Cor: PMI nondisplaced. Regular rate & rhythm. + RV lift. 2/6 AS/MR/TR Lungs: crackles at bases Abdomen: soft, nontender, nondistended. No hepatosplenomegaly. No bruits or masses. Good bowel sounds. Extremities: no cyanosis, clubbing, rash, R and LLE 3+ edema.  Neuro: alert & orientedx3, cranial nerves grossly intact. moves all 4 extremities w/o difficulty. Affect pleasant  Telemetry: A fib   Labs: Basic Metabolic Panel:  Recent Labs Lab 01/02/15 0821 01/03/15 0436 01/04/15 0458 01/06/15 1404 01/07/15 0240  NA 133* 128* 128* 124* 124*  K 3.7 3.6 4.4 4.3 4.4  CL 95* 95* 95* 90* 88*  CO2 30 24 27 28 25   GLUCOSE 104* 104* 101* 186* 128*  BUN 40* 45* 53* 63* 64*  CREATININE 1.27* 1.30* 1.58* 1.63* 1.75*  CALCIUM 8.5 8.1* 8.2* 9.0 9.2    Liver Function Tests: No results for input(s): AST, ALT, ALKPHOS, BILITOT, PROT, ALBUMIN in the last 168 hours. No results for input(s): LIPASE, AMYLASE in the last 168 hours. No results for input(s): AMMONIA in the last 168 hours.  CBC:  Recent Labs Lab 01/01/15 0345 01/02/15 0821 01/03/15 0436 01/07/15 0240  WBC 10.9* 11.1* 10.2 11.1*  NEUTROABS  --   --   --  8.1*  HGB 10.3* 10.7* 10.4* 11.3*  HCT 31.3* 33.3* 32.2* 34.7*  MCV 96.9 96.2 96.4 95.3  PLT 178  184 177 191    Cardiac Enzymes: No results for input(s): CKTOTAL, CKMB, CKMBINDEX, TROPONINI in the last 168 hours.  BNP: BNP (last 3 results)  Recent Labs  12/27/14 0344 12/30/14 0924 01/07/15 0240  BNP 1339.0* 1176.8* 1342.9*    ProBNP (last 3 results)  Recent Labs  02/12/14 1421  07/22/14 1213 01/06/15 1404  PROBNP 1542.0* 1209.0* 1412.0*     CBG:  Recent Labs Lab 01/03/15 2103 01/04/15 0547 01/04/15 1116 01/07/15 0144 01/07/15 1228  GLUCAP 167* 106* 144* 110* 184*    Coagulation Studies: No results for input(s): LABPROT, INR in the last 72 hours.  Other results: EKG: Atrial tach with variable block   Imaging:  No results found.   Medications:     Current Medications: . allopurinol  300 mg Oral Daily  . amiodarone  200 mg Oral BID  . apixaban  2.5 mg Oral BID  . atorvastatin  10 mg Oral q1800  . [START ON 01/08/2015] cadexomer iodine  1 application Topical QODAY  . calcium-vitamin D  1 tablet Oral Q breakfast  . furosemide  40 mg Intravenous Q12H  . gabapentin  300 mg Oral QHS  . levothyroxine  112 mcg Oral QAC breakfast  . multivitamin with minerals  1 tablet Oral Daily  . silver sulfADIAZINE  1 application Topical QODAY  . sodium chloride  3 mL Intravenous Q12H  . sodium chloride  3 mL Intravenous Q12H     Infusions:      Assessment:   1. A/C Diastolic Heart Failure  2. Right heart failure 3. Severe valvular disease 4. A/C renal failure due to cardiorenal syndrome 5. Chronic A tach/fibrillation off BB. On eliquis  6. DNR/DNI   Plan/Discussion:    Mrs Bannister is 81 year old admitted with recurrent volume overload. Increase lasix 80 mg twice a day. Renal function worsening. Add 0.125 mcg milrinone to see if this helps diuresis.    Will likely need to transition to torsemide when volume status is optimized.   Dr Gala Romney to follow.    Length of Stay: 0  CLEGG,AMY NP-C  01/07/2015, 1:25 PM  Advanced Heart Failure Team Pager [phone] (M-F; 7a - 4p)  Please contact [facility] for night-coverage after hours (4p -7a ) and weekends on [email or web address]  Patient seen and examined with Tonye Becket, NP. We discussed all aspects of the encounter. I agree with the assessment and plan as stated above.   She has severe  biventricular HF in the setting of severe valvular heart disease. She has volume overload with cardiorenal syndrome. Agree with inotropes and IV lasix. Continue apixaban. She remains on amiodarone but is still in atrial tach. Can consider stopping.   I talked with her about goals of care and she is clear that she would not want intubation or CPR. Pressors ok.   Jaylaa Gallion,MD 5:25 PM

## 2015-01-07 NOTE — ED Notes (Signed)
MD notified that of Bladder scan results and is requesting foley placement to monitor I/O

## 2015-01-07 NOTE — Discharge Instructions (Signed)

## 2015-01-07 NOTE — Progress Notes (Signed)
Heart Failure Navigator Consult Note  Presentation: [name] is a 81 y.o. female with a history of Diastolic CHF, Atrial Fibrillation, Aortic Stenosis, HTN, DM2, Hyperlipidemia, PVD who was brought to the ED due to complaints of increased weakness and fatigue and edema of both lower legs. She denies having any chest pain or SOB, she has had increased weakness and difficulty walking due to her increased edema. She was discharged from the hospital 2 days ago for CHF, and Had follow up with her cardiologist today and her lasix rx was increased to 80 mg but she reports that she had very little urine output from the dose. In the ED this AM, she was found to have a sodium level of 124.    Past Medical History  Diagnosis Date  . Hypertension   . Peripheral neuropathy   . Asthma   . High cholesterol   . Diabetes with neurologic complications   . PAD (peripheral artery disease)   . Enlarged heart   . Anginal pain 05/28/12  . Pneumonia 11/2010  . Sleep apnea     cpap  . Hypothyroidism   . Headache(784.0) 05/29/12    "recently"  . Stroke 05/2005; 08/2010    !mini"  . Personal history of gout   . Hypothyroidism   . DJD (degenerative joint disease)   . Carpal tunnel syndrome, bilateral   . Recurrent labyrinthitis   . GERD (gastroesophageal reflux disease)     S/P esophageal dilation in 1996  . Gallstone     Silent  . Fibrocystic breast changes   . Hemorrhoids     with bleeding  . Hx of adenomatous colonic polyps     Dr Sherin Quarry  . Diverticulitis     left side  . MR (mitral regurgitation)     moderate  . Mild anemia     hemoglobin 11.8 on 06/2009  . Edema of lower extremity     Chronic  . Ischemic colitis 8/13  . Aortic stenosis, moderate   . Chronic diastolic CHF (congestive heart failure)   . Moderate mitral regurgitation   . CKD (chronic kidney disease) stage 3, GFR 30-59 ml/min     "something on labs always say she has some problems"  . Chronic atrial  fibrillation   . Peripheral arterial disease     critical limb ischemia, right third toe    History   Social History  . Marital Status: Widowed    Spouse Name: N/A  . Number of Children: 3  . Years of Education: 8th   Occupational History  . RETIRED     [employer]   Social History Main Topics  . Smoking status: Never Smoker   . Smokeless tobacco: Never Used  . Alcohol Use: No  . Drug Use: No  . Sexual Activity: No   Other Topics Concern  . None   Social History Narrative   Retired. Lives alone. 3 adult children. No tobacco use. Nondrinker. No illicit drug use.    Caffeine Use: none    ECHO: 12/28/14  Study Conclusions  - Left ventricle: The cavity size was normal. Wall thickness was increased in a pattern of mild LVH. Systolic function was normal. The estimated ejection fraction was in the range of 60% to 65%. Indeterminant diastolic function (atrial fibrillation). Wall motion was normal; there were no regional wall motion abnormalities. - Ventricular septum: D-shaped interventricular septum suggestive of RV pressure/volume overload. - Aortic valve: Trileaflet; severely calcified leaflets. There was trivial regurgitation. Mild  AS by mean gradient, severe by valve area. Visually, the valve looks at least moderate stenotic. Mean gradient (S): 15 mm Hg. Valve area (VTI): 0.73 cm^2. - Mitral valve: Moderately to severely calcified annulus. Moderately calcified leaflets . The findings are consistent with moderate stenosis. There is at least moderate regurgitation, this may not be fully characterized as there is a lot of shadowing from the calcified mitral valve. Mean gradient (D): 8 mm Hg. Valve area by continuity equation (using LVOT flow): 1.15 cm^2. - Left atrium: The atrium was severely dilated. - Right ventricle: The cavity size was moderately dilated. Systolic function was mildly reduced. - Right atrium: The atrium was severely  dilated. - Tricuspid valve: The leaflets do not fully coapt. There was severe regurgitation with hepatic vein systolic doppler flow reversal. Peak RV-RA gradient (S): 46 mm Hg. - Pulmonary arteries: PA peak pressure: 61 mm Hg (S). - Systemic veins: IVC measured 2.7 cm with < 50% respirophasic variation, suggesting RA pressure 15 mmHg.  Impressions:  - The patient was in atrial fibrillation. Normal LV size with mild LV hypertrophy. Moderately dilated RV with mildly decreased systolic function. The aortic valve was severely calcified. Mean gradient was only 15 mmHg suggesting mild AS but calculated valve area 0.73 cm^2, suggesting severe. Visually at least moderate aortic stenosis. This may need further characterization by TEE. The mitral valve was heavily calcified. There appeared to be moderate mitral stenosis with mean gradient 8 mmHg. There was at least moderate MR but shadowing from calcified valve makes this difficult to fully evaluate. Severe TR. Moderate pulmonary hypertension. Dilated IVC. Overall, TEE may be helpful to assess the valves more closely.  Transthoracic echocardiography. M-mode, complete 2D, spectral Doppler, and color Doppler. Birthdate: Patient birthdate: Dec 26, 1923. Age: Patient is 81 yr old. Sex: Gender: female. BMI: 25.3 kg/m^2. Blood pressure:   111/72 Patient status: Inpatient. Study date: Study date: 12/28/2014. Study time: 09:11 AM. Location: ICU/CCU  BNP    Component Value Date/Time   BNP 1342.9* 01/07/2015 0240    ProBNP    Component Value Date/Time   PROBNP 1412.0* 01/06/2015 1404     Education Assessment and Provision:  Detailed education and instructions provided on heart failure disease management including the following:  Signs and symptoms of Heart Failure When to call the physician Importance of daily weights Low sodium diet Fluid restriction Medication management Anticipated future  follow-up appointments  Patient education given on each of the above topics.  Patient acknowledges understanding and acceptance of all instructions.  I spoke with patients daughter regarding her HF.  She was seen outpatient by the HF clinic last year and then resumed care with Carolanne Grumbling.  She has 24/7 caregivers at home.  Her daughter can teach back all topics listed above and says that her caregivers are very careful with her diet.  She weighs daily--her ideal dry weight has been between 145-150 lbs and until recently she was very successful at maintaining that.  She was hospitalized several days ago and not done well since discharge to home--most recently with very little urine output at home.  I will stop back in to see if any further education is needed later in hospital stay.   Education Materials:  "Living Better With Heart Failure" Booklet, Daily Weight Tracker Tool .   High Risk Criteria for Readmission and/or Poor Patient Outcomes:   EF <30%-No 60-65%  2 or more admissions in 6 months-Yes  Difficult social situation- No  Demonstrates medication noncompliance-No  Discharge Planning:  Plans at this time to return home with 24/7 caregivers.

## 2015-01-07 NOTE — ED Notes (Signed)
Phlebotomy at bedside.

## 2015-01-07 NOTE — Care Management Note (Signed)
    Page 1 of 1   01/11/2015     4:41:02 PM CARE MANAGEMENT NOTE 01/11/2015  Patient:  [name],[name]   Account Number:  [id number]  Date Initiated:  01/07/2015  Documentation initiated by:  Mikahla Wisor  Subjective/Objective Assessment:   Pt adm on 01/07/15 with CHF, hyponatremia.  PTA, pt resides at home with 24h caregiver.  [facility]     Action/Plan:   Will follow for dc needs as pt progresses.  Will need PT/OT consults.   Anticipated DC Date:  01/11/2015   Anticipated DC Plan:  SKILLED NURSING FACILITY  In-house referral  Clinical Social Worker      DC Planning Services  CM consult      Choice offered to / List presented to:             Status of service:  Completed, signed off Medicare Important Message given?  YES (If response is "NO", the following Medicare IM given date fields will be blank) Date Medicare IM given:  01/11/2015 Medicare IM given by:  Schyler Butikofer Date Additional Medicare IM given:   Additional Medicare IM given by:    Discharge Disposition:  SKILLED NURSING FACILITY  Per UR Regulation:  Reviewed for med. necessity/level of care/duration of stay  If discussed at Long Length of Stay Meetings, dates discussed:    Comments:  01/11/15 Sidney AceJulie Fleda Pagel, RN, BSN  [phone] Pt discharging to SNF today, per CSW arrangements.

## 2015-01-07 NOTE — ED Notes (Signed)
Pt arrives from home via [facility]. Pt c/o urinary urge but unable to void. Pt also c/o lower Abd pain 8/10. Pt was just released from the hospital Monday due to similar complaint.

## 2015-01-07 NOTE — Progress Notes (Signed)
Pt admitted to floor from ED via stretcher, oriented to room, call bell placed within reach, admission assessment done, pt denied pain. Pt's temp=95.0 rectally, Donnamarie PoagK. Kirby NP on call made aware, warm blankets placed on pt, we'll recheck temp in an hour,  Initial orders carried out. Family at bedside. Will continue to monitor.   01/07/15 0550  Vitals  Temp (!) 95 F (35 C) (nurse notified )  Temp Source Rectal  BP (!) 100/55 mmHg  BP Location Left Arm  BP Method Manual  Patient Position (if appropriate) Lying  Pulse Rate 81  Resp 16  Oxygen Therapy  SpO2 99 %  O2 Device Room Air  Height and Weight  Height 5\' 7"  (1.702 m)  Weight 77.3 kg (170 lb 6.7 oz) (bed)  Type of Scale Used Bed  BSA (Calculated - sq m) 1.91 sq meters  BMI (Calculated) 26.7  Weight in (lb) to have BMI = 25 159.3

## 2015-01-07 NOTE — Progress Notes (Signed)
Patient Demographics  [name], is a 81 y.o. female, DOB - 12-Feb-1924, [id number]  Admit date - 01/07/2015   Admitting Physician Ron Parker, MD  Outpatient Primary MD for the patient is Ginette Otto, MD  LOS - 0   Chief Complaint  Patient presents with  . Urinary Retention      Admission history of present illness/brief narrative: [name] is a 81 y.o. female with a history of Diastolic CHF, Atrial Fibrillation, Aortic Stenosis, HTN, DM2, Hyperlipidemia, PVD who was brought to the ED due to complaints of increased weakness and fatigue and edema of both lower legs. She denies having any chest pain or SOB, she has had increased weakness and difficulty walking due to her increased edema. She was discharged from the hospital 2 days ago for CHF, and Had follow up with her cardiologist as an outpatient at day of admission, and her lasix rx was increased to 80 mg but she reports that she had very little urine output from the dose  she was found to have a sodium level of 124.    Subjective:   [name] today has, No headache, No chest pain, No abdominal pain - No Nausea, No new weakness tingling or numbness, No Cough ,   Assessment & Plan    Principal Problem:   Hyponatremia with excess extracellular fluid volume Active Problems:   Chronic atrial fibrillation   Diastolic heart failure, NYHA class 3   Hypothyroidism   Diabetes with neurologic complications   HTN (hypertension)   Urinary retention   CKD (chronic kidney disease) stage 3, GFR 30-59 ml/min  Acute on chronic diastolic CHF - Recent echo 12/28/14 showing EF 60-65%, with intermittent diastolic dysfunction(atrial fibrillation), moderate aortic stenosis, pulmonary hypertension,Moderate MS/MR . - Start on IV Lasix 40 mg twice a day ( as blood pressure allows) -  Daily weights, strict ins and out. - Cardiology consulted  Paroxysmal atrial fibrillation - ChadsVasc 6 , on Eliquis - Continue with  Amiodarone, BB on hold given soft blood pressure.  Hyponatremia - Most likely related to volume overload, will check urine osmolality, serum osmolality, urine sodium.  Hypothyroidism - Continue with Synthroid  Diabetes mellitus - Continue with insulin sliding scale - Follow on hemoglobin A1c  Acute on chronic kidney disease - Monitor closely as an IV diuresis.  Urinary retention - 250 mL after Foley catheter insertion, Foley inserted, voiding trial in 24 hours.  Peripheral arterial disease - Nonhealing wound on the right third toe - Continue with wound care  Code Status: DO NOT RESUSCITATE  Family Communication: Daughter at bedside  Disposition Plan: PT evaluation pending   Procedures  none   Consults   Cardiology   Medications  Scheduled Meds: . allopurinol  300 mg Oral Daily  . amiodarone  200 mg Oral BID  . apixaban  2.5 mg Oral BID  . atorvastatin  10 mg Oral q1800  . [START ON 01/08/2015] cadexomer iodine  1 application Topical QODAY  . calcium-vitamin D  1 tablet Oral Q breakfast  . gabapentin  300 mg Oral QHS  . levothyroxine  112 mcg Oral QAC breakfast  . multivitamin with minerals  1 tablet Oral Daily  . silver sulfADIAZINE  1 application  Topical QODAY  . sodium chloride  3 mL Intravenous Q12H  . sodium chloride  3 mL Intravenous Q12H   Continuous Infusions:  PRN Meds:.sodium chloride, acetaminophen **OR** acetaminophen, alum & mag hydroxide-simeth, HYDROmorphone (DILAUDID) injection, ondansetron **OR** ondansetron (ZOFRAN) IV, oxyCODONE, sodium chloride  DVT Prophylaxis  on Eliquis  Lab Results  Component Value Date   PLT 191 01/07/2015    Antibiotics   Anti-infectives    None          Objective:   Filed Vitals:   01/07/15 0430 01/07/15 0445 01/07/15 0500 01/07/15 0550  BP: 105/57 87/57 98/64   100/55  Pulse:  78 77 81  Temp:    95 F (35 C)  TempSrc:    Rectal  Resp: Height:     (1.702 m)  Weight:    77.3 kg (170 lb 6.7 oz)  SpO2:  100% 99% 99%    Wt Readings from Last 3 Encounters:  01/07/15 77.3 kg (170 lb 6.7 oz)  12/11/14 65.772 kg (145 lb)  09/09/14 67.586 kg (149 lb)     Intake/Output Summary (Last 24 hours) at 01/07/15 1145 Last data filed at 01/07/15 0944  Gross per 24 hour  Intake    600 ml  Output    500 ml  Net    100 ml     Physical Exam  Awake Alert, Oriented X 3, No new F.N deficits, Normal affect [place].AT,PERRAL Supple Neck, Symmetrical Chest wall movement, Good air movement bilaterally,  ,No Gallops,Rubs  No Parasternal Heave +ve B.Sounds, Abd Soft, No tenderness, No organomegaly appriciated, No rebound - guarding or rigidity. No Cyanosis, Clubbing ,+2 edema, No new Rash or bruise , with ulceration of right third toe   Data Review   Micro Results No results found for this or any previous visit (from the past 240 hour(s)).  Radiology Reports No results found.  CBC  Recent Labs Lab 01/01/15 0345 01/02/15 0821 01/03/15 0436 01/07/15 0240  WBC 10.9* 11.1* 10.2 11.1*  HGB 10.3* 10.7* 10.4* 11.3*  HCT 31.3* 33.3* 32.2* 34.7*  PLT 178 184 177 191  MCV 96.9 96.2 96.4 95.3  MCH 31.9 30.9 31.1 31.0  MCHC 32.9 32.1 32.3 32.6  RDW 17.0* 17.0* 17.0* 16.7*  LYMPHSABS  --   --   --  1.8  MONOABS  --   --   --  1.1*  EOSABS  --   --   --  0.1  BASOSABS  --   --   --  0.0    Chemistries   Recent Labs Lab 01/02/15 0821 01/03/15 0436 01/04/15 0458 01/06/15 1404 01/07/15 0240  NA 133* 128* 128* 124* 124*  K 3.7 3.6 4.4 4.3 4.4  CL 95* 95* 95* 90* 88*  CO2 GLUCOSE 104* 104* 101* 186* 128*  BUN 40* 45* 53* 63* 64*  CREATININE 1.27* 1.30* 1.58* 1.63* 1.75*  CALCIUM 8.5 8.1* 8.2* 9.0 9.2    ------------------------------------------------------------------------------------------------------------------ estimated creatinine clearance is 23.8 mL/min (by C-G formula based on Cr of 1.75). ------------------------------------------------------------------------------------------------------------------ No results for input(s): HGBA1C in the last 72 hours. ------------------------------------------------------------------------------------------------------------------ No results for input(s): CHOL, HDL, LDLCALC, TRIG, CHOLHDL, LDLDIRECT in the last 72 hours. ------------------------------------------------------------------------------------------------------------------ No results for input(s): TSH, T4TOTAL, T3FREE, THYROIDAB in the last 72 hours.  Invalid input(s): FREET3 ------------------------------------------------------------------------------------------------------------------ No results for input(s): VITAMINB12, FOLATE, FERRITIN, TIBC, IRON, RETICCTPCT in the last 72 hours.  Coagulation profile No results for input(s): INR,  PROTIME in the last 168 hours.  No results for input(s): DDIMER in the last 72 hours.  Cardiac Enzymes No results for input(s): CKMB, TROPONINI, MYOGLOBIN in the last 168 hours.  Invalid input(s): CK ------------------------------------------------------------------------------------------------------------------ Invalid input(s): POCBNP     Time Spent in minutes   30 minutes   [name] M.D on 01/07/2015 at 11:45 AM  Between 7am to 7pm - Pager - [phone]  After 7pm go to [email or web address] - password [email or web address]  And look for the night coverage person covering for me after hours  [facility] Office  [phone]   **Disclaimer: This note may have been dictated with voice recognition software. Similar sounding words can inadvertently be transcribed and this note may contain transcription errors which may not have been  corrected upon publication of note.**

## 2015-01-08 DIAGNOSIS — Z515 Encounter for palliative care: Secondary | ICD-10-CM

## 2015-01-08 DIAGNOSIS — R531 Weakness: Secondary | ICD-10-CM

## 2015-01-08 LAB — AMMONIA: AMMONIA: 24 umol/L (ref 11–32)

## 2015-01-08 LAB — CBC
HCT: 30.3 % — ABNORMAL LOW (ref 36.0–46.0)
Hemoglobin: 9.9 g/dL — ABNORMAL LOW (ref 12.0–15.0)
MCH: 30.7 pg (ref 26.0–34.0)
MCHC: 32.7 g/dL (ref 30.0–36.0)
MCV: 94.1 fL (ref 78.0–100.0)
Platelets: 181 10*3/uL (ref 150–400)
RBC: 3.22 MIL/uL — ABNORMAL LOW (ref 3.87–5.11)
RDW: 16.6 % — AB (ref 11.5–15.5)
WBC: 10.5 10*3/uL (ref 4.0–10.5)

## 2015-01-08 LAB — GLUCOSE, CAPILLARY
GLUCOSE-CAPILLARY: 153 mg/dL — AB (ref 70–99)
Glucose-Capillary: 137 mg/dL — ABNORMAL HIGH (ref 70–99)
Glucose-Capillary: 185 mg/dL — ABNORMAL HIGH (ref 70–99)
Glucose-Capillary: 139 mg/dL — ABNORMAL HIGH (ref 70–99)

## 2015-01-08 LAB — BASIC METABOLIC PANEL
ANION GAP: 9 (ref 5–15)
BUN: 65 mg/dL — AB (ref 6–23)
CALCIUM: 8.4 mg/dL (ref 8.4–10.5)
CO2: 26 mmol/L (ref 19–32)
CREATININE: 1.74 mg/dL — AB (ref 0.50–1.10)
Chloride: 92 mmol/L — ABNORMAL LOW (ref 96–112)
GFR calc non Af Amer: 25 mL/min — ABNORMAL LOW (ref >90)
GFR, EST AFRICAN AMERICAN: 29 mL/min — AB (ref >90)
Glucose, Bld: 123 mg/dL — ABNORMAL HIGH (ref 70–99)
Potassium: 4.4 mmol/L (ref 3.5–5.1)
Sodium: 127 mmol/L — ABNORMAL LOW (ref 135–145)

## 2015-01-08 MED ORDER — MORPHINE SULFATE (CONCENTRATE) 10 MG/0.5ML PO SOLN: 5.0000 mg | ORAL | Status: DC | PRN

## 2015-01-08 NOTE — Consult Note (Signed)
Patient [name]      DOB: [birth year]      CZY:[id number]     Consult Note from the Palliative Medicine Team at [facility] Requested by: Dr. Haroldine Laws    PCP: Mathews Argyle, MD Reason for Consultation: [place]     Phone [phone]  Assessment of patients Current state:  I met today with [name] (she fell asleep during conversation) and her 2 daughters at bedside. We discussed natural progression of disease with heart failure and renal failure. [name] seems a little confused (she is not remembering past conversations and is unable to answer some of my questions - just staring blankly) today and she falls asleep during conversation. I continue discussing with her daughters SNF with transition to hospice facility. When discussing options her daughters felt like they would want her to try PT if she can and they understand this will not be provided in hospice facility. She is still eating 100% of meals and is not having any symptoms at this time so I fear hospice facility may not take her but I recommend transition to hospice facility OR with any changes in current condition (shortness of breath, worsening renal failure/urine output) may consult hospice facility. They understand that she likely will not be able to participate in therapy and at that time she will likely be a candidate for hospice facility. We discussed plan for low dose morphine in case of shortness of breath. MOST complete: DNR, comfort measures (no hospital readmission), consider limits/benefits of antibiotics, no IVF, no feeding tube.    Goals of Care: 1.  Code Status: DNR   2. Disposition: SNF with transition to hospice care.    3. Symptom Management:   1. Weakness: Continue medical management.   2. Shortness of breath: Roxanol 5 mg every 2 hours prn - this is ONLY for shortness of breath. 3. Chronic lower back pain (h/o kyphoplasty): Family says that she takes Tylenol at home and  this helps - continue prn. Educated that she/they will need to ask nurse for this as it is not scheduled.   4. Psychosocial: Emotional support provided to patient and to family at bedside.    Brief HPI: 81 yo female with a h/o Diastolic CHF, Atrial Fibrillation, Aortic Stenosis, HTN, DM2, Hyperlipidemia, PVD who was brought to the ED due to complaints of increased weakness and fatigue and edema of both lower legs. She denies having any chest pain or SOB, she has had increased weakness and difficulty walking due to her increased edema. She was discharged from the hospital 2 days ago for CHF andhad follow up with her cardiologist today and her lasix was increased to 80 mg but she reports that she had very little urine output from the dose.Has had worsening renal function and weakness during hospitalization. She was originally placed on milrinone but this has been d/c. Now to discuss more options based on comfort. PMH reviewed below.    ROS: + weakness    PMH:  Past Medical History  Diagnosis Date  . Hypertension   . Peripheral neuropathy   . Asthma   . High cholesterol   . Diabetes with neurologic complications   . PAD (peripheral artery disease)   . Enlarged heart   . Anginal pain 05/28/12  . Pneumonia 11/2010  . Sleep apnea     cpap  . Hypothyroidism   . Headache(784.0) 05/29/12    "recently"  . Stroke 05/2005; 08/2010    !mini"  .  Personal history of gout   . Hypothyroidism   . DJD (degenerative joint disease)   . Carpal tunnel syndrome, bilateral   . Recurrent labyrinthitis   . GERD (gastroesophageal reflux disease)     S/P esophageal dilation in 1996  . Gallstone     Silent  . Fibrocystic breast changes   . Hemorrhoids     with bleeding  . Hx of adenomatous colonic polyps     Dr Sammuel Cooper  . Diverticulitis     left side  . MR (mitral regurgitation)     moderate  . Mild anemia     hemoglobin 11.8 on 06/2009  . Edema of lower extremity     Chronic  . Ischemic  colitis 8/13  . Aortic stenosis, moderate   . Chronic diastolic CHF (congestive heart failure)   . Moderate mitral regurgitation   . CKD (chronic kidney disease) stage 3, GFR 30-59 ml/min     "something on labs always say she has some problems"  . Chronic atrial fibrillation   . Peripheral arterial disease     critical limb ischemia, right third toe     PSH: Past Surgical History  Procedure Laterality Date  . Tonsillectomy and adenoidectomy  1960's  . Dilation and curettage of uterus    . Vaginal hysterectomy  1970's  . Total knee arthroplasty  1970-~2002    left; right  . Cataract extraction w/ intraocular lens  implant, bilateral  ?1970's  . Kyphoplasty N/A     L2   I have reviewed the [place] and SH and  If appropriate update it with new information. Allergies  Allergen Reactions  . Ambien [Zolpidem Tartrate] Other (See Comments)    Pt does not wakeup from [place]  . Aspirin Other (See Comments)    Tears my stomach up; "can take coated Aspirin qd without problems"  . Nexium [Esomeprazole Magnesium] Rash  . Penicillins Rash and Other (See Comments)    Tolerates Rocephin   Scheduled Meds: . allopurinol  300 mg Oral Daily  . apixaban  2.5 mg Oral BID  . atorvastatin  10 mg Oral q1800  . cadexomer iodine  1 application Topical QODAY  . calcium-vitamin D  1 tablet Oral Q breakfast  . furosemide  80 mg Intravenous Q12H  . gabapentin  300 mg Oral QHS  . levothyroxine  112 mcg Oral QAC breakfast  . multivitamin with minerals  1 tablet Oral Daily  . silver sulfADIAZINE  1 application Topical QODAY  . sodium chloride  3 mL Intravenous Q12H  . sodium chloride  3 mL Intravenous Q12H   Continuous Infusions:  PRN Meds:.sodium chloride, acetaminophen **OR** acetaminophen, alum & mag hydroxide-simeth, HYDROmorphone (DILAUDID) injection, ondansetron **OR** ondansetron (ZOFRAN) IV, oxyCODONE, sodium chloride    BP 95/46 mmHg  Pulse 89  Temp(Src) 97.6 F (36.4 C) (Oral)  Resp 16   Ht $R'5\' 7"'Hn$  (1.702 m)  Wt 75.4 kg (166 lb 3.6 oz)  BMI 26.03 kg/m2  SpO2 96%   PPS: 30% at best   Intake/Output Summary (Last 24 hours) at 01/08/15 1258 Last data filed at 01/08/15 0947  Gross per 24 hour  Intake 1008.35 ml  Output   1050 ml  Net -41.65 ml   LBM: 3/11  Physical Exam:  General: NAD, sitting up in bed, pleasant, elderly HEENT: /AT, +JVD, moist mucous membranes Chest:  Bibasilar crackles CVS: RRR, S1 S2 Abdomen: Soft, NT, ND, +BS Ext: MAE, BLE 2+ edema, warm to touch Neuro: Awake, alert,  oriented x 3  Labs: CBC    Component Value Date/Time   WBC 10.5 01/08/2015 0437   RBC 3.22* 01/08/2015 0437   HGB 9.9* 01/08/2015 0437   HCT 30.3* 01/08/2015 0437   PLT 181 01/08/2015 0437   MCV 94.1 01/08/2015 0437   MCH 30.7 01/08/2015 0437   MCHC 32.7 01/08/2015 0437   RDW 16.6* 01/08/2015 0437   LYMPHSABS 1.8 01/07/2015 0240   MONOABS 1.1* 01/07/2015 0240   EOSABS 0.1 01/07/2015 0240   BASOSABS 0.0 01/07/2015 0240    BMET    Component Value Date/Time   NA 127* 01/08/2015 0437   K 4.4 01/08/2015 0437   CL 92* 01/08/2015 0437   CO2 26 01/08/2015 0437   GLUCOSE 123* 01/08/2015 0437   BUN 65* 01/08/2015 0437   CREATININE 1.74* 01/08/2015 0437   CALCIUM 8.4 01/08/2015 0437   GFRNONAA 25* 01/08/2015 0437   GFRAA 29* 01/08/2015 0437    CMP     Component Value Date/Time   NA 127* 01/08/2015 0437   K 4.4 01/08/2015 0437   CL 92* 01/08/2015 0437   CO2 26 01/08/2015 0437   GLUCOSE 123* 01/08/2015 0437   BUN 65* 01/08/2015 0437   CREATININE 1.74* 01/08/2015 0437   CALCIUM 8.4 01/08/2015 0437   PROT 5.9* 12/27/2014 1013   ALBUMIN 2.7* 12/27/2014 1013   AST 33 12/27/2014 1013   ALT 21 12/27/2014 1013   ALKPHOS 96 12/27/2014 1013   BILITOT 0.7 12/27/2014 1013   GFRNONAA 25* 01/08/2015 0437   GFRAA 29* 01/08/2015 0437     Time In Time Out Total Time Spent with Patient Total Overall Time  1300 1430 35min 40min    Greater than 50%  of this time was  spent counseling and coordinating care related to the above assessment and plan.  Vinie Sill, NP Palliative Medicine Team Pager # [phone] (M-F 8a-5p) Team Phone # [phone] (Nights/Weekends)

## 2015-01-08 NOTE — Progress Notes (Signed)
Advanced Heart Failure Rounding Note   Subjective:    [name] is an 81 year old with history of of hypertension, chronic A fib on eliquis , hyperlipidemia , diabetes, peripheral artery disease , sleep apnea, hypothyroidism, stroke , moderate aortic stenosis, severe tricuspid regurgitation, pulmonary hypertension admitted with recurrent diastolic HF and RHF. She is wheelchair bound at home.  Started on milrinone for cardiorenal syndrome on 3/10. Weight down 4 pounds. Renal function starting to stabilize. However she remains very lethargic and weak.    Objective:   Weight Range:  Vital Signs:   Temp:  [95 F (35 C)-97.6 F (36.4 C)] 97.6 F (36.4 C) (03/10 2113) Pulse Rate:  [62-89] 89 (03/10 2113) Resp:  [11-18] 17 (03/10 2113) BP: (87-110)/(53-68) 99/64 mmHg (03/10 2139) SpO2:  [93 %-100 %] 97 % (03/10 2113) Weight:  [73.483 kg (162 lb)-77.3 kg (170 lb 6.7 oz)] 77.3 kg (170 lb 6.7 oz) (03/10 0550) Last BM Date: 01/07/15  Weight change: Filed Weights   01/07/15 0130 01/07/15 0550  Weight: 73.483 kg (162 lb) 77.3 kg (170 lb 6.7 oz)    Intake/Output:   Intake/Output Summary (Last 24 hours) at 01/08/15 0055 Last data filed at 01/07/15 2200  Gross per 24 hour  Intake 1316.15 ml  Output   1050 ml  Net 266.15 ml     Physical Exam: General: Elderly. In bed. No resp difficulty. Falls asleep during conversation HEENT: normal Neck: supple. JVP to ear . Carotids 2+ bilat; no bruits. No lymphadenopathy or thryomegaly appreciated. Cor: PMI nondisplaced. Regular rate & rhythm. + RV lift. 2/6 AS/MR/TR Lungs: crackles at bases Abdomen: soft, nontender, nondistended. No hepatosplenomegaly. No bruits or masses. Good bowel sounds. Extremities: no cyanosis, clubbing, rash, R and LLE 3+ edema.  Neuro: alert & orientedx3, cranial nerves grossly intact. moves all 4 extremities w/o difficulty. Affect pleasant  Telemetry: A fib   Labs: Basic Metabolic Panel:  Recent Labs Lab  01/02/15 0821 01/03/15 0436 01/04/15 0458 01/06/15 1404 01/07/15 0240  NA 133* 128* 128* 124* 124*  K 3.7 3.6 4.4 4.3 4.4  CL 95* 95* 95* 90* 88*  CO2 30 24 27 28 25   GLUCOSE 104* 104* 101* 186* 128*  BUN 40* 45* 53* 63* 64*  CREATININE 1.27* 1.30* 1.58* 1.63* 1.75*  CALCIUM 8.5 8.1* 8.2* 9.0 9.2    Liver Function Tests: No results for input(s): AST, ALT, ALKPHOS, BILITOT, PROT, ALBUMIN in the last 168 hours. No results for input(s): LIPASE, AMYLASE in the last 168 hours. No results for input(s): AMMONIA in the last 168 hours.  CBC:  Recent Labs Lab 01/01/15 0345 01/02/15 0821 01/03/15 0436 01/07/15 0240  WBC 10.9* 11.1* 10.2 11.1*  NEUTROABS  --   --   --  8.1*  HGB 10.3* 10.7* 10.4* 11.3*  HCT 31.3* 33.3* 32.2* 34.7*  MCV 96.9 96.2 96.4 95.3  PLT 178 184 177 191    Cardiac Enzymes: No results for input(s): CKTOTAL, CKMB, CKMBINDEX, TROPONINI in the last 168 hours.  BNP: BNP (last 3 results)  Recent Labs  12/27/14 0344 12/30/14 0924 01/07/15 0240  BNP 1339.0* 1176.8* 1342.9*    ProBNP (last 3 results)  Recent Labs  02/12/14 1421 07/22/14 1213 01/06/15 1404  PROBNP 1542.0* 1209.0* 1412.0*      Other results:  Imaging:  No results found.   Medications:     Scheduled Medications: . allopurinol  300 mg Oral Daily  . amiodarone  200 mg Oral BID  . apixaban  2.5 mg Oral BID  . atorvastatin  10 mg Oral q1800  . cadexomer iodine  1 application Topical QODAY  . calcium-vitamin D  1 tablet Oral Q breakfast  . furosemide  80 mg Intravenous Q12H  . gabapentin  300 mg Oral QHS  . levothyroxine  112 mcg Oral QAC breakfast  . multivitamin with minerals  1 tablet Oral Daily  . silver sulfADIAZINE  1 application Topical QODAY  . sodium chloride  3 mL Intravenous Q12H  . sodium chloride  3 mL Intravenous Q12H     Infusions: . milrinone 0.25 mcg/kg/min (01/07/15 1546)     PRN Medications:  sodium chloride, acetaminophen **OR**  acetaminophen, alum & mag hydroxide-simeth, HYDROmorphone (DILAUDID) injection, ondansetron **OR** ondansetron (ZOFRAN) IV, oxyCODONE, sodium chloride   Assessment:   1. A/C Diastolic Heart Failure  2. Right heart failure 3. Severe valvular disease - moderate AS. Moderate MR/Judith. Severe TR.  4. A/C renal failure due to cardiorenal syndrome, stage IV 5. Chronic A tach/fibrillation on amio. Failed previous DC-CVs. On eliquis  6. DNR/DNI  Plan/Discussion:    She has severe biventricular HF in the setting of severe valvular heart disease. Milrinone added yesterday due to cardiorenal syndrome and severe RHF however she remains very tenuous.   I had long talk with her daughter Lynden Ang and it is clear that Judith. Teagarden has really declined over past few months. She is now 2-person assist with any activity. She can no longer live at home. We discussed SNF vs. Palliative Care and we favor the latter. Have consulted Palliative Care. Will stop milrinone.    Length of Stay: 1 Arvilla Meres MD 01/08/2015, 12:55 AM  Advanced Heart Failure Team Pager [phone] (M-F; 7a - 4p)  Please contact [facility] for night-coverage after hours (4p -7a ) and weekends on [email or web address]

## 2015-01-08 NOTE — Progress Notes (Signed)
Patient requested to be off her CPAP.  Patient verbalized that she was uncomfortable.  Offered [place], pt declined.  Will monitor and evaluate at intervals.

## 2015-01-08 NOTE — Progress Notes (Signed)
Patient Demographics  [name], is a 81 y.o. female, DOB - August 06, 1924, ZOX:[id number]  Admit date - 01/07/2015   Admitting Physician Ron Parker, MD  Outpatient Primary MD for the patient is Ginette Otto, MD  LOS - 1   Chief Complaint  Patient presents with  . Urinary Retention      Admission history of present illness/brief narrative: [name] is a 81 y.o. female with a history of Diastolic CHF, Atrial Fibrillation, Aortic Stenosis, HTN, DM2, Hyperlipidemia, PVD who was brought to the ED due to complaints of increased weakness and fatigue and edema of both lower legs. She denies having any chest pain or SOB, she has had increased weakness and difficulty walking due to her increased edema. She was discharged from the hospital 2 days ago for CHF, and Had follow up with her cardiologist as an outpatient at day of admission, and her lasix rx was increased to 80 mg but she reports that she had very little urine output from the dose  she was found to have a sodium level of 124. Patient was seen by audiology, started initially on milrinone drip, Dr. Milas Kocher discussed with daughter, at this point family started to pursue aggressive measures, they want to continue with palliative care measures .    Subjective:   [name] today has, No headache, No chest pain, No abdominal pain - No Nausea, still complains of just weakness, and baseline shortness of breath .  Assessment & Plan    Principal Problem:   Hyponatremia with excess extracellular fluid volume Active Problems:   Chronic atrial fibrillation   Diastolic heart failure, NYHA class 3   Hypothyroidism   Diabetes with neurologic complications   HTN (hypertension)   Weakness   Urinary retention   CKD (chronic kidney disease) stage 3, GFR 30-59 ml/min   Acute on chronic  diastolic congestive heart failure   Atrial tachycardia   Cardiorenal syndrome with renal failure   RVF (right ventricular failure)   Mitral stenosis   Severe tricuspid regurgitation   Palliative care encounter  Acute on chronic diastolic CHF - Recent echo 12/28/14 showing EF 60-65%, with intermittent diastolic dysfunction(atrial fibrillation), moderate aortic stenosis, pulmonary hypertension,Moderate MS/MR . -  On IV lasix - Daily weights, strict ins and out. - Cardiology consult pretreated. - Patient was started on milrinone drip, after cardiologist discussion with the family, patient has been made palliative care.  Paroxysmal atrial fibrillation - ChadsVasc 6 , on Eliquis - Continue with  Amiodarone, BB on hold given soft blood pressure.  Hyponatremia - Most likely related to volume overload, serum osmolality 261, urine sodium less than 10. - Improving  Hypothyroidism - Continue with Synthroid  Diabetes mellitus - Continue with insulin sliding scale - Follow on hemoglobin A1c  Acute on chronic kidney disease - Continue to worsen, as patient is on IV diuresis. This is due to cardiorenal syndrome.  Urinary retention - 250 mL after Foley catheter insertion, Foley inserted, voiding trial in 24 hours.  Peripheral arterial disease - Nonhealing wound on the right third toe - Continue with wound care  Code Status: DO NOT RESUSCITATE  Family Communication: Daughter at bedside  Disposition Plan: For skilled nursing facility with palliative care when bed  Is available  Palliative discussed with patient and family .  Procedures  none   Consults   Cardiology   Medications  Scheduled Meds: . allopurinol  300 mg Oral Daily  . apixaban  2.5 mg Oral BID  . atorvastatin  10 mg Oral q1800  . cadexomer iodine  1 application Topical QODAY  . calcium-vitamin D  1 tablet Oral Q breakfast  . furosemide  80 mg Intravenous Q12H  . gabapentin  300 mg Oral QHS  . levothyroxine   112 mcg Oral QAC breakfast  . multivitamin with minerals  1 tablet Oral Daily  . silver sulfADIAZINE  1 application Topical QODAY  . sodium chloride  3 mL Intravenous Q12H  . sodium chloride  3 mL Intravenous Q12H   Continuous Infusions:  PRN Meds:.sodium chloride, acetaminophen **OR** acetaminophen, alum & mag hydroxide-simeth, HYDROmorphone (DILAUDID) injection, morphine CONCENTRATE, ondansetron **OR** ondansetron (ZOFRAN) IV, oxyCODONE, sodium chloride  DVT Prophylaxis  on Eliquis  Lab Results  Component Value Date   PLT 181 01/08/2015    Antibiotics   Anti-infectives    None          Objective:   Filed Vitals:   01/07/15 2113 01/07/15 2139 01/08/15 0616 01/08/15 1422  BP: 93/53  Pulse: 89  89 90  Temp: 97.6 F (36.4 C)  97.6 F (36.4 C) 97.3 F (36.3 C)  TempSrc: Oral  Oral Oral  Resp: Height:      Weight:   75.4 kg (166 lb 3.6 oz)   SpO2: 97%  96% 100%    Wt Readings from Last 3 Encounters:  01/08/15 75.4 kg (166 lb 3.6 oz)  12/11/14 65.772 kg (145 lb)  09/09/14 67.586 kg (149 lb)     Intake/Output Summary (Last 24 hours) at 01/08/15 1808 Last data filed at 01/08/15 1413  Gross per 24 hour  Intake 1008.35 ml  Output   1525 ml  Net -516.65 ml     Physical Exam  Awake Alert, Oriented X 3, No new F.N deficits, Normal affect [name].AT,PERRAL Supple Neck, Symmetrical Chest wall movement, Good air movement bilaterally,  ,No Gallops,Rubs  No Parasternal Heave +ve B.Sounds, Abd Soft, No tenderness, No organomegaly appriciated, No rebound - guarding or rigidity. No Cyanosis, Clubbing ,+2 edema, No new Rash or bruise , with ulceration of right third toe   Data Review   Micro Results No results found for this or any previous visit (from the past 240 hour(s)).  Radiology Reports No results found.  CBC  Recent Labs Lab 01/02/15 0821 01/03/15 0436 01/07/15 0240 01/08/15 0437  WBC 11.1* 10.2 11.1* 10.5  HGB 10.7*  10.4* 11.3* 9.9*  HCT 33.3* 32.2* 34.7* 30.3*  PLT 184 177 191 181  MCV 96.2 96.4 95.3 94.1  MCH 30.9 31.1 31.0 30.7  MCHC 32.1 32.3 32.6 32.7  RDW 17.0* 17.0* 16.7* 16.6*  LYMPHSABS  --   --  1.8  --   MONOABS  --   --  1.1*  --   EOSABS  --   --  0.1  --   BASOSABS  --   --  0.0  --     Chemistries   Recent Labs Lab 01/03/15 0436 01/04/15 0458 01/06/15 1404 01/07/15 0240 01/08/15 0437  NA 128* 128* 124* 124* 127*  K 3.6 4.4 4.3 4.4 4.4  CL 95* 95* 90* 88* 92*  CO2 GLUCOSE 104* 101* 186* 128*  123*  BUN 45* 53* 63* 64* 65*  CREATININE 1.30* 1.58* 1.63* 1.75* 1.74*  CALCIUM 8.1* 8.2* 9.0 9.2 8.4   ------------------------------------------------------------------------------------------------------------------ estimated creatinine clearance is 23.7 mL/min (by C-G formula based on Cr of 1.74). ------------------------------------------------------------------------------------------------------------------ No results for input(s): HGBA1C in the last 72 hours. ------------------------------------------------------------------------------------------------------------------ No results for input(s): CHOL, HDL, LDLCALC, TRIG, CHOLHDL, LDLDIRECT in the last 72 hours. ------------------------------------------------------------------------------------------------------------------ No results for input(s): TSH, T4TOTAL, T3FREE, THYROIDAB in the last 72 hours.  Invalid input(s): FREET3 ------------------------------------------------------------------------------------------------------------------ No results for input(s): VITAMINB12, FOLATE, FERRITIN, TIBC, IRON, RETICCTPCT in the last 72 hours.  Coagulation profile No results for input(s): INR, PROTIME in the last 168 hours.  No results for input(s): DDIMER in the last 72 hours.  Cardiac Enzymes No results for input(s): CKMB, TROPONINI, MYOGLOBIN in the last 168 hours.  Invalid input(s):  CK ------------------------------------------------------------------------------------------------------------------ Invalid input(s): POCBNP     Time Spent in minutes   30 minutes   Sidnee Gambrill M.D on 01/08/2015 at 6:08 PM  Between 7am to 7pm - Pager - [phone]  After 7pm go to [email or web address] - password [email or web address]  And look for the night coverage person covering for me after hours  [facility] Office  [phone]   **Disclaimer: This note may have been dictated with voice recognition software. Similar sounding words can inadvertently be transcribed and this note may contain transcription errors which may not have been corrected upon publication of note.**

## 2015-01-09 DIAGNOSIS — I5033 Acute on chronic diastolic (congestive) heart failure: Secondary | ICD-10-CM

## 2015-01-09 DIAGNOSIS — N184 Chronic kidney disease, stage 4 (severe): Secondary | ICD-10-CM | POA: Insufficient documentation

## 2015-01-09 LAB — GLUCOSE, CAPILLARY
GLUCOSE-CAPILLARY: 129 mg/dL — AB (ref 70–99)
Glucose-Capillary: 118 mg/dL — ABNORMAL HIGH (ref 70–99)
Glucose-Capillary: 161 mg/dL — ABNORMAL HIGH (ref 70–99)
Glucose-Capillary: 153 mg/dL — ABNORMAL HIGH (ref 70–99)

## 2015-01-09 LAB — BASIC METABOLIC PANEL
ANION GAP: 6 (ref 5–15)
BUN: 58 mg/dL — ABNORMAL HIGH (ref 6–23)
CALCIUM: 8.2 mg/dL — AB (ref 8.4–10.5)
CHLORIDE: 95 mmol/L — AB (ref 96–112)
CO2: 31 mmol/L (ref 19–32)
Creatinine, Ser: 1.66 mg/dL — ABNORMAL HIGH (ref 0.50–1.10)
GFR calc Af Amer: 31 mL/min — ABNORMAL LOW (ref >90)
GFR, EST NON AFRICAN AMERICAN: 26 mL/min — AB (ref >90)
GLUCOSE: 120 mg/dL — AB (ref 70–99)
Potassium: 4 mmol/L (ref 3.5–5.1)
SODIUM: 132 mmol/L — AB (ref 135–145)

## 2015-01-09 NOTE — Progress Notes (Signed)
Patient ID: [name], female   DOB: 10-Sep-1924, 81 y.o.   MRN: [id number] [facility] PROGRESS NOTE  Judith BruinsClarice V Bala [id number] DOB: 10-Sep-1924 DOA: 01/07/2015 PCP: Ginette OttoSTONEKING,HAL THOMAS, MD  Brief narrative:    81 y.o. female with a history of chronic diastolic CHF, Atrial Fibrillation on anticoagulation with apixaban, Aortic Stenosis, HTN, DM2, Hyperlipidemia, PVD who [facility] with progressive weakness, fatigue, lower extremity edema. She was seen by cardiology in consultation and required milrinone drip on admission. Palliative consulted for goals of care. Plan for discharge to SNF once bed available.   Assessment/Plan:    Principal Problem: Acute on chronic diastolic CHF - Patient has severe biventricular heart failure in the setting of severe valvular heart disease - 2 D ECHO 12/28/14 showed EF 60-65%, with intermittent diastolic dysfunction (atrial fibrillation), moderate aortic stenosis, pulmonary hypertension, moderate MS/MR. - BNP 1342 - As noted above, she was on milrinone drip but family now agreeable to comfort approach so milrinone drip stopped. - Per cardiology, will continue IV lasix 80 mg Q 12 hours but if she goes to SNF will change to PO regimen. - Weight in past 24 hours, 75.4 kg to 74.2 kg  Active Problems: Paroxysmal atrial fibrillation - ChadsVasc score 6 - Continue anticoagulation with Eliquis - Rate controlled, she is not on BB at present since BP 93/61.  Hyponatremia - Most likely related to volume overload, CHF etiology - Sodium trended up 124 --> 132  Hypothyroidism - Continue Synthroid 112 mcg daily  Diabetes mellitus, controlled - No recent A1c on file but in 2015 controlled - on SSI now  Anemia of chronic kidney disease - Hemoglobin dropped from 11.3 to 9.9, no CBC this am - Will check CBC tomorrow am - No reports of bleeding   Dyslipidemia  - Continue statin therapy   Acute on chronic kidney disease / CKD stage  4 - Baseline creatinine 1.5 and on this admission 1.7 - She is on lasix which could worsen renal function - Continue to monitor renal function while on lasix   Urinary retention - 250 mL after Foley catheter insertion, Foley inserted  Peripheral arterial disease / neuropathy - Nonhealing wound on the right third toe - Continue with wound care - Continue gabapentin  DVT Prophylaxis  - On anticoagulation with apixaban    Code Status: DNR/DNI Family Communication:  plan of care discussed with the patient Disposition Plan: To SNF once bed available, SW assisting D/C plan.  IV access:  Peripheral IV  Procedures and diagnostic studies:    No results found.  Medical Consultants:  Cardiology, Dr. Charlton HawsPeter Nishan Palliative care  Other Consultants:  Physical therapy Social work   IAnti-Infectives:      Manson PasseyEVINE, Lexine Jaspers, MD  [facility] Pager [phone]  If 7PM-7AM, please contact night-coverage [email or web address] Password [email or web address] 01/09/2015, 7:37 PM   LOS: 2 days    HPI/Subjective: No acute overnight events.  Objective: Filed Vitals:   01/08/15 1422 01/08/15 2021 01/09/15 0512 01/09/15 1440  BP: 93/53 111/53 101/47 93/61  Pulse: 90 92 92 95  Temp: 97.3 F (36.3 C) 98 F (36.7 C) 97.4 F (36.3 C) 97.8 F (36.6 C)  TempSrc: Oral Oral Oral Oral  Resp: 20 18 18 20   Height:      Weight:   74.2 kg (163 lb 9.3 oz)   SpO2: 100% 100% 100% 96%    Intake/Output Summary (Last 24 hours) at 01/09/15 1937 Last data filed at 01/09/15 1914  Gross per  24 hour  Intake   1080 ml  Output   2500 ml  Net  -1420 ml    Exam:   General:  Pt is no acute distress  Cardiovascular: Regular rate and rhythm, S1/S2 (+), SEM appreciated 2/6  Respiratory: Bibasilar crackles, no wheezing   Abdomen: Soft, non tender, non distended, bowel sounds present  Extremities: +2 LE pitting edema, pulses DP and PT palpable bilaterally  Neuro: Grossly nonfocal  Data Reviewed: Basic  Metabolic Panel:  Recent Labs Lab 01/04/15 0458 01/06/15 1404 01/07/15 0240 01/08/15 0437 01/09/15 0347  NA 128* 124* 124* 127* 132*  K 4.4 4.3 4.4 4.4 4.0  CL 95* 90* 88* 92* 95*  CO2 GLUCOSE 101* 186* 128* 123* 120*  BUN 53* 63* 64* 65* 58*  CREATININE 1.58* 1.63* 1.75* 1.74* 1.66*  CALCIUM 8.2* 9.0 9.2 8.4 8.2*   Liver Function Tests: No results for input(s): AST, ALT, ALKPHOS, BILITOT, PROT, ALBUMIN in the last 168 hours. No results for input(s): LIPASE, AMYLASE in the last 168 hours.  Recent Labs Lab 01/08/15 1141  AMMONIA 24   CBC:  Recent Labs Lab 01/03/15 0436 01/07/15 0240 01/08/15 0437  WBC 10.2 11.1* 10.5  NEUTROABS  --  8.1*  --   HGB 10.4* 11.3* 9.9*  HCT 32.2* 34.7* 30.3*  MCV 96.4 95.3 94.1  PLT 177 191 181   Cardiac Enzymes: No results for input(s): CKTOTAL, CKMB, CKMBINDEX, TROPONINI in the last 168 hours. BNP: Invalid input(s): POCBNP CBG:  Recent Labs Lab 01/08/15 1611 01/08/15 2121 01/09/15 0608 01/09/15 1153 01/09/15 1618  GLUCAP 153* 139* 118* 161* 129*    No results found for this or any previous visit (from the past 240 hour(s)).   Scheduled Meds: . allopurinol  300 mg Oral Daily  . apixaban  2.5 mg Oral BID  . atorvastatin  10 mg Oral q1800  . calcium-vitamin D  1 tablet Oral Q breakfast  . furosemide  80 mg Intravenous Q12H  . gabapentin  300 mg Oral QHS  . levothyroxine  112 mcg Oral QAC breakfast  . multivitamin with min  1 tablet Oral Daily  . silver sulfADIAZINE  1 application Topical QODAY

## 2015-01-09 NOTE — Progress Notes (Signed)
Patient ID: [name], female   DOB: Jan 21, 1924, 81 y.o.   MRN: [id number]    Subjective:    [name] is an 81 year old with history of of hypertension, chronic A fib on eliquis , hyperlipidemia , diabetes, peripheral artery disease , sleep apnea, hypothyroidism, stroke , moderate aortic stenosis, severe tricuspid regurgitation, pulmonary hypertension admitted with recurrent diastolic HF and RHF. She is wheelchair bound at home.  Palliative care now milrinone d/c  Discussed care with daughter    Objective:   Weight Range:  Vital Signs:   Temp:  [97.3 F (36.3 C)-98 F (36.7 C)] 97.4 F (36.3 C) (03/12 0512) Pulse Rate:  [90-92] 92 (03/12 0512) Resp:  [18-20] 18 (03/12 0512) BP: (93-111)/(47-53) 101/47 mmHg (03/12 0512) SpO2:  [100 %] 100 % (03/12 0512) Weight:  [74.2 kg (163 lb 9.3 oz)] 74.2 kg (163 lb 9.3 oz) (03/12 0512) Last BM Date: 01/07/15  Weight change: Filed Weights   01/07/15 0550 01/08/15 0616 01/09/15 0512  Weight: 77.3 kg (170 lb 6.7 oz) 75.4 kg (166 lb 3.6 oz) 74.2 kg (163 lb 9.3 oz)    Intake/Output:   Intake/Output Summary (Last 24 hours) at 01/09/15 1042 Last data filed at 01/09/15 1025  Gross per 24 hour  Intake   1080 ml  Output   2775 ml  Net  -1695 ml     Physical Exam: General: Elderly. In bed. No resp difficulty. Falls asleep during conversation HEENT: normal Neck: supple. JVP to ear . Carotids 2+ bilat; no bruits. No lymphadenopathy or thryomegaly appreciated. Cor: PMI nondisplaced. Regular rate & rhythm. + RV lift. 2/6 AS/MR/TR Lungs: crackles at bases Abdomen: soft, nontender, nondistended. No hepatosplenomegaly. No bruits or masses. Good bowel sounds. Extremities: no cyanosis, clubbing, rash, R and LLE 2 plus edema  S/p bilateral knee replacements Neuro: alert & orientedx3, cranial nerves grossly intact. moves all 4 extremities w/o difficulty. Affect pleasant  Telemetry: A fib   Labs: Basic Metabolic Panel:  Recent  Labs Lab 01/04/15 0458 01/06/15 1404 01/07/15 0240 01/08/15 0437 01/09/15 0347  NA 128* 124* 124* 127* 132*  K 4.4 4.3 4.4 4.4 4.0  CL 95* 90* 88* 92* 95*  CO2 27 28 25 26 31   GLUCOSE 101* 186* 128* 123* 120*  BUN 53* 63* 64* 65* 58*  CREATININE 1.58* 1.63* 1.75* 1.74* 1.66*  CALCIUM 8.2* 9.0 9.2 8.4 8.2*     Recent Labs Lab 01/08/15 1141  AMMONIA 24    CBC:  Recent Labs Lab 01/03/15 0436 01/07/15 0240 01/08/15 0437  WBC 10.2 11.1* 10.5  NEUTROABS  --  8.1*  --   HGB 10.4* 11.3* 9.9*  HCT 32.2* 34.7* 30.3*  MCV 96.4 95.3 94.1  PLT 177 191 181    BNP: BNP (last 3 results)  Recent Labs  12/27/14 0344 12/30/14 0924 01/07/15 0240  BNP 1339.0* 1176.8* 1342.9*    ProBNP (last 3 results)  Recent Labs  02/12/14 1421 07/22/14 1213 01/06/15 1404  PROBNP 1542.0* 1209.0* 1412.0*      Other results:  Imaging: No results found.   Medications:     Scheduled Medications: . allopurinol  300 mg Oral Daily  . apixaban  2.5 mg Oral BID  . atorvastatin  10 mg Oral q1800  . cadexomer iodine  1 application Topical QODAY  . calcium-vitamin D  1 tablet Oral Q breakfast  . furosemide  80 mg Intravenous Q12H  . gabapentin  300 mg Oral QHS  . levothyroxine  112 mcg Oral QAC breakfast  . multivitamin with minerals  1 tablet Oral Daily  . silver sulfADIAZINE  1 application Topical QODAY  . sodium chloride  3 mL Intravenous Q12H  . sodium chloride  3 mL Intravenous Q12H    Infusions:    PRN Medications: sodium chloride, acetaminophen **OR** acetaminophen, alum & mag hydroxide-simeth, HYDROmorphone (DILAUDID) injection, morphine CONCENTRATE, ondansetron **OR** ondansetron (ZOFRAN) IV, oxyCODONE, sodium chloride   Assessment:   1. A/C Diastolic Heart Failure  2. Right heart failure 3. Severe valvular disease - moderate AS. Moderate MR/Judith. Severe TR.  4. A/C renal failure due to cardiorenal syndrome, stage IV 5. Chronic A tach/fibrillation on amio.  Failed previous DC-CVs. On eliquis  6. DNR/DNI  Plan/Discussion:    She has severe biventricular HF in the setting of severe valvular heart disease. See note by DB Palliative care social services have seen  Reasonable to continue eliquis at low dose She appears To need chronic foley now  Would transition to po lasix 80 bid  Will sign off    [employer]

## 2015-01-09 NOTE — Progress Notes (Signed)
Pt/family provided with bed offers.  [facility] most of pt's family.  CSW will f/u with facility re: bed availability.

## 2015-01-09 NOTE — Progress Notes (Signed)
Assisted pt. With placing on her home cpap. Pt. Tolerating well at this time. 

## 2015-01-10 LAB — BASIC METABOLIC PANEL
Anion gap: 11 (ref 5–15)
BUN: 50 mg/dL — ABNORMAL HIGH (ref 6–23)
CALCIUM: 8.5 mg/dL (ref 8.4–10.5)
CO2: 27 mmol/L (ref 19–32)
CREATININE: 1.46 mg/dL — AB (ref 0.50–1.10)
Chloride: 93 mmol/L — ABNORMAL LOW (ref 96–112)
GFR calc Af Amer: 36 mL/min — ABNORMAL LOW (ref >90)
GFR, EST NON AFRICAN AMERICAN: 31 mL/min — AB (ref >90)
GLUCOSE: 106 mg/dL — AB (ref 70–99)
POTASSIUM: 3.1 mmol/L — AB (ref 3.5–5.1)
Sodium: 131 mmol/L — ABNORMAL LOW (ref 135–145)

## 2015-01-10 LAB — GLUCOSE, CAPILLARY
Glucose-Capillary: 113 mg/dL — ABNORMAL HIGH (ref 70–99)
Glucose-Capillary: 160 mg/dL — ABNORMAL HIGH (ref 70–99)
Glucose-Capillary: 153 mg/dL — ABNORMAL HIGH (ref 70–99)
Glucose-Capillary: 148 mg/dL — ABNORMAL HIGH (ref 70–99)

## 2015-01-10 LAB — CBC
HEMATOCRIT: 30.1 % — AB (ref 36.0–46.0)
HEMOGLOBIN: 10.1 g/dL — AB (ref 12.0–15.0)
MCH: 31.1 pg (ref 26.0–34.0)
MCHC: 33.6 g/dL (ref 30.0–36.0)
MCV: 92.6 fL (ref 78.0–100.0)
PLATELETS: 178 10*3/uL (ref 150–400)
RBC: 3.25 MIL/uL — ABNORMAL LOW (ref 3.87–5.11)
RDW: 16.8 % — ABNORMAL HIGH (ref 11.5–15.5)
WBC: 9.4 10*3/uL (ref 4.0–10.5)

## 2015-01-10 MED ORDER — POTASSIUM CHLORIDE CRYS ER 20 MEQ PO TBCR
40.0000 meq | EXTENDED_RELEASE_TABLET | Freq: Once | ORAL | Status: AC
  Administered 2015-01-10: 40 meq via ORAL
  Filled 2015-01-10: qty 2

## 2015-01-10 MED ORDER — MORPHINE SULFATE (CONCENTRATE) 10 MG/0.5ML PO SOLN: 5.0000 mg | ORAL | Status: AC | PRN

## 2015-01-10 MED ORDER — OXYCODONE HCL 5 MG PO TABS: 5.0000 mg | ORAL_TABLET | ORAL | Status: AC | PRN

## 2015-01-10 NOTE — Progress Notes (Signed)
RT assisted pt. With placing on her home cpap. Pt. Tolerating well at this time.

## 2015-01-10 NOTE — Plan of Care (Signed)
Problem: Phase I Progression Outcomes Goal: EF % per last Echo/documented,Core Reminder form on chart Outcome: Completed/Met Date Met:  01/10/15 EF 60-65%(12-28-14)

## 2015-01-10 NOTE — Discharge Summary (Signed)
Physician Discharge Summary  GUNEET DELPINO ZOX:[id number] DOB: April 06, 1924 DOA: 01/07/2015  PCP: Ginette Otto, MD  Admit date: 01/07/2015 Discharge date: 01/10/2015  Recommendations for Outpatient Follow-up:  1. Pt will need to follow up with PCP in 2-3 weeks post discharge 2. Please obtain BMP to evaluate electrolytes and kidney function 3. Please also check CBC to evaluate Hg and Hct levels  Discharge Diagnoses:  Principal Problem:   Hyponatremia with excess extracellular fluid volume Active Problems:   Chronic atrial fibrillation   Diastolic heart failure, NYHA class 3   Hypothyroidism   Diabetes with neurologic complications   HTN (hypertension)   Weakness   Urinary retention   CKD (chronic kidney disease) stage 3, GFR 30-59 ml/min   Acute on chronic diastolic congestive heart failure   Atrial tachycardia   Cardiorenal syndrome with renal failure   RVF (right ventricular failure)   Mitral stenosis   Severe tricuspid regurgitation   Palliative care encounter   CKD (chronic kidney disease)   Discharge Condition: Stable  Diet recommendation: Heart healthy diet discussed in details    Brief narrative:    81 y.o. female with a history of chronic diastolic CHF, Atrial Fibrillation on anticoagulation with apixaban, Aortic Stenosis, HTN, DM2, Hyperlipidemia, PVD who presented to [facility] ED with progressive weakness, fatigue, lower extremity edema. She was seen by cardiology in consultation and required milrinone drip on admission. Palliative consulted for goals of care. Plan for discharge to SNF once bed available.   Assessment/Plan:    Principal Problem: Acute on chronic diastolic CHF - Patient has severe biventricular heart failure in the setting of severe valvular heart disease - 2 D ECHO 12/28/14 showed EF 60-65%, with intermittent diastolic dysfunction (atrial fibrillation), moderate aortic stenosis, pulmonary hypertension, moderate MS/MR. - BNP 1342 - As  noted above, she was on milrinone drip but family now agreeable to comfort approach so milrinone drip stopped. - Per cardiology, will continue Lasix 80 mg PO BID upon discharge  - Weight in past 24 hours: 166 lbs --> 171 lbs this AM  Active Problems: Paroxysmal atrial fibrillation - ChadsVasc score 6 - Continue anticoagulation with Eliquis - Rate controlled, she is not on BB at present since BP on low end of normal   Hyponatremia - Most likely related to volume overload, CHF etiology - Sodium trended up 124 --> 131  Hypothyroidism - Continue Synthroid 112 mcg daily  Diabetes mellitus, controlled - No recent A1c on file but in 2015 controlled - on SSI while inpatient only and no need for outpatient insulin at this time   Anemia of chronic kidney disease - Hemoglobin dropped from 11.3 to 9.9 but up to 10.1 this AM - No reports of bleeding   Dyslipidemia  - Continue statin therapy   Acute on chronic kidney disease / CKD stage 4 - Baseline creatinine 1.5 and on this admission 1.7 - She is on lasix which could worsen renal function - Cr is trending down and is at baseline this AM  Urinary retention - 250 mL after Foley catheter insertion, Foley inserted, may go to SNF with foley and have it removed there   Peripheral arterial disease / neuropathy - Nonhealing wound on the right third toe - Continue with wound care - Continue gabapentin  DVT Prophylaxis  - On anticoagulation with apixaban    Code Status: DNR/DNI Family Communication: plan of care discussed with the patient Disposition Plan: To SNF once bed available, SW assisting D/C plan.  IV access:  Peripheral IV  Procedures and diagnostic studies:   No results found.  Medical Consultants:  Cardiology, Dr. Charlton Haws Palliative care  Other Consultants:  Physical therapy Social work   IAnti-Infectives:          Discharge Exam: Filed Vitals:   01/10/15 0606  BP: 108/69  Pulse:  92  Temp: 98.1 F (36.7 C)  Resp: 18   Filed Vitals:   01/09/15 0512 01/09/15 1440 01/09/15 2019 01/10/15 0606  BP: 101/47 93/61 112/64 108/69  Pulse: 92 95 96 92  Temp: 97.4 F (36.3 C) 97.8 F (36.6 C) 97.4 F (36.3 C) 98.1 F (36.7 C)  TempSrc: Oral Oral Oral   Resp: Height:      Weight: 74.2 kg (163 lb 9.3 oz)   78 kg (171 lb 15.3 oz)  SpO2: 100% 96% 96% 97%    General: Pt is alert, follows commands appropriately, not in acute distress Cardiovascular: Regular rate and rhythm,  no rubs, no gallops Respiratory: Clear to auscultation bilaterally, no wheezing, diminished breath sounds at bases  Abdominal: Soft, non tender, non distended, bowel sounds +, no guarding Extremities: no cyanosis, pulses palpable bilaterally DP and PT Neuro: Grossly nonfocal  Discharge Instructions  Discharge Instructions    Diet - low sodium heart healthy    Complete by:  As directed      Increase activity slowly    Complete by:  As directed             Medication List    STOP taking these medications        metoprolol succinate 25 MG 24 hr tablet  Commonly known as:  TOPROL-XL     traMADol 50 MG tablet  Commonly known as:  ULTRAM      TAKE these medications        acetaminophen 500 MG tablet  Commonly known as:  TYLENOL  Take 500 mg by mouth at bedtime. Give with the Tramadol 50 mg     allopurinol 300 MG tablet  Commonly known as:  ZYLOPRIM  Take 1 tablet (300 mg total) by mouth daily.     amiodarone 200 MG tablet  Commonly known as:  PACERONE  Take 1 tablet (200 mg total) by mouth 2 (two) times daily.     apixaban 2.5 MG Tabs tablet  Commonly known as:  ELIQUIS  Take 1 tablet (2.5 mg total) by mouth 2 (two) times daily.     atorvastatin 10 MG tablet  Commonly known as:  LIPITOR  Take 1 tablet (10 mg total) by mouth daily.     cadexomer iodine 0.9 % gel  Commonly known as:  IODOSORB  Apply 1 application topically daily. For wound dressing on toe.  Alternate days used with silver sulfadiazine cream.     Calcium-Vitamin D 600-200 MG-UNIT Caps  Take 1 tablet by mouth daily.     furosemide 40 MG tablet  Commonly known as:  LASIX  Take two tablets (80 mg) by mouth twice daily     gabapentin 300 MG capsule  Commonly known as:  NEURONTIN  Take 1 capsule (300 mg total) by mouth at bedtime.     ketoconazole 2 % cream  Commonly known as:  NIZORAL     levothyroxine 112 MCG tablet  Commonly known as:  SYNTHROID, LEVOTHROID  Take 112 mcg by mouth daily before breakfast.     morphine CONCENTRATE 10 MG/0.5ML Soln concentrated solution  Take 0.25 mLs (5 mg  total) by mouth every 2 (two) hours as needed for shortness of breath.     multivitamin with minerals tablet  Take 1 tablet by mouth daily.     ONE TOUCH ULTRA TEST test strip  Generic drug:  glucose blood  1 each by Other route See admin instructions. Check blood sugar once daily.     oxyCODONE 5 MG immediate release tablet  Commonly known as:  Oxy IR/ROXICODONE  Take 1 tablet (5 mg total) by mouth every 4 (four) hours as needed for moderate pain.     silver sulfADIAZINE 1 % cream  Commonly known as:  SILVADENE  Apply 1 application topically daily.           Follow-up Information    Follow up with Ginette OttoSTONEKING,HAL THOMAS, MD.   Specialty:  Internal Medicine   Contact information:   [address] [place] [address] [phone]        The results of significant diagnostics from this hospitalization (including imaging, microbiology, ancillary and laboratory) are listed below for reference.     Microbiology: No results found for this or any previous visit (from the past 240 hour(s)).   Labs: Basic Metabolic Panel:  Recent Labs Lab 01/06/15 1404 01/07/15 0240 01/08/15 0437 01/09/15 0347 01/10/15 0620  NA 124* 124* 127* 132* 131*  K 4.3 4.4 4.4 4.0 3.1*  CL 90* 88* 92* 95* 93*  CO2 28 25 26 31 27   GLUCOSE 186* 128* 123* 120* 106*  BUN 63*  64* 65* 58* 50*  CREATININE 1.63* 1.75* 1.74* 1.66* 1.46*  CALCIUM 9.0 9.2 8.4 8.2* 8.5    Recent Labs Lab 01/08/15 1141  AMMONIA 24   CBC:  Recent Labs Lab 01/07/15 0240 01/08/15 0437 01/10/15 0620  WBC 11.1* 10.5 9.4  NEUTROABS 8.1*  --   --   HGB 11.3* 9.9* 10.1*  HCT 34.7* 30.3* 30.1*  MCV 95.3 94.1 92.6  PLT 191 181 178     Recent Labs  12/27/14 0344 12/30/14 0924 01/07/15 0240  BNP 1339.0* 1176.8* 1342.9*    ProBNP (last 3 results)  Recent Labs  02/12/14 1421 07/22/14 1213 01/06/15 1404  PROBNP 1542.0* 1209.0* 1412.0*    CBG:  Recent Labs Lab 01/09/15 0608 01/09/15 1153 01/09/15 1618 01/09/15 2107 01/10/15 0605  GLUCAP 118* 161* 129* 153* 113*     SIGNED: Time coordinating discharge: Over 30 minutes  Debbora PrestoMAGICK-Kelly Eisler, MD  [facility] 01/10/2015, 10:12 AM Pager [phone]  If 7PM-7AM, please contact night-coverage [email or web address] Password [email or web address]

## 2015-01-10 NOTE — Clinical Social Work Note (Signed)
Clinical Social Worker met with patient's family present at bedside to present bed offers. Patient's family reported that they would prefer placement at [facility] due to facility being close to family. Pt's dtr's reported they were planning to visit [facility] today and make a decision. CSW strongly encouraged pt's dtr's to contact CSW once a decision has been made.   CSW contacted [facility] several times today in regards to facility reviewing pt's clinicals and extending bed offer. CSW left and message for administrator.  CSW continues to follow pt and pt's family for continued support and to facilitate discharge needs.   Glendon Axe, MSW, LCSWA [phone] 01/10/2015 2:05 PM

## 2015-01-10 NOTE — Evaluation (Signed)
Physical Therapy Evaluation Patient Details Name: Judith BruinsClarice V Engelhard MRN: [id number] DOB: 1924/07/23 Today's Date: 01/10/2015   History of Present Illness    81 y.o. female with a history of chronic diastolic CHF, Atrial Fibrillation on anticoagulation with apixaban, Aortic Stenosis, HTN, DM2, Hyperlipidemia, PVD who [facility] with progressive weakness, fatigue, lower extremity edema. She was seen by cardiology in consultation and required milrinone drip on admission. Palliative consulted for goals of care. Plan for discharge to SNF once bed available.    Clinical Impression  Pt presents with severe limitations to functional mobility related to medical issues and requiring extensive physical assist for basic mobility ADLs.  Recommend 2-person assist for safety and encourage very short bouts of physical activity in the form of bed>chair, bed>bedside commode, brief standing bouts with RW.  Agree with SNF for postacute rehab needs for as long as pt can participate in order to preserve dignity and quality of life.  Will initiate PT in acute setting as detailed below, see care plan for goals of care.      Follow Up Recommendations SNF;Supervision/Assistance - 24 hour    Equipment Recommendations  None recommended by PT    Recommendations for Other Services       Precautions / Restrictions Precautions Precautions: Fall;Other (comment) (skin risk) Precaution Comments: use 2 person assist for OOB, walker in room, gait belt; float heels (pt c/o heel pain) and assist with postion changes Restrictions Other Position/Activity Restrictions: elevate legs to assist with edema control      Mobility  Bed Mobility Overal bed mobility: Needs Assistance Bed Mobility: Supine to Sit     Supine to sit: Mod assist;HOB elevated     General bed mobility comments: pt initiates with cues, slow to perform, able to use rail to rotate, needs phys assist to raise trunk and shift hips to EOB; posterior  lean bias  Transfers Overall transfer level: Needs assistance Equipment used: Rolling walker (2 wheeled) Transfers: Sit to/from [employer] CorporationStand;Stand Pivot Transfers Sit to Stand: Max assist Stand pivot transfers: Mod assist       General transfer comment: cues to scoot out, lean forward, needs assist to accomplish; phys assist for initial lift off and to maintain forward trunk into standing; cues to load bear with legs and arms at RW;  pt able to shift RW around 90 degrees using multiple steps, and cues to reach back and sit slow.  Pt fatigues after.  baseline dizziness is constant, mild and improved after transfer; BP = 108/64  Ambulation/Gait             General Gait Details: unable this session  Stairs            Wheelchair Mobility    Modified Rankin (Stroke Patients Only)       Balance Overall balance assessment: Needs assistance Sitting-balance support: Bilateral upper extremity supported;No upper extremity supported;Feet unsupported Sitting balance-Leahy Scale: Poor Sitting balance - Comments: posterior lean bias; cues to put feet on floor Postural control: Posterior lean Standing balance support: Bilateral upper extremity supported;During functional activity Standing balance-Leahy Scale: Poor Standing balance comment: dependent on RW, can stand short duration (est 10 sec) without assist,  Single Leg Stance - Right Leg: 0 Single Leg Stance - Left Leg: 0 Tandem Stance - Right Leg: 0 Tandem Stance - Left Leg: 0 Rhomberg - Eyes Opened: 0 Rhomberg - Eyes Closed: 0                 Pertinent  Vitals/Pain Pain Assessment: 0-10 Pain Score: 5  Pain Location: heels due to pressure on pillow; floating heels decreased pain Pain Intervention(s): Limited activity within patient's tolerance;Monitored during session;Repositioned  BP 108/64 following bed>chair    Home Living Family/patient expects to be discharged to:: Skilled nursing facility Living Arrangements:  Alone Available Help at Discharge: Personal care attendant;Available 24 hours/day Type of Home: House Home Access: Ramped entrance     Home Layout: One level Home Equipment: Walker - 2 wheels;Walker - 4 wheels;Grab bars - toilet;Grab bars - tub/shower;Bedside commode;Shower seat;Wheelchair - manual Additional Comments: per chart, pt appropriate for higher level of care at SNF with transition to Palliative Care as appropriate    Prior Function Level of Independence: Needs assistance   Gait / Transfers Assistance Needed: assist for standing, transfers and all ADLS, pt uses RW in house and [facility] outside     Comments: assist to stand, 24 hour supervision     Hand Dominance        Extremity/Trunk Assessment   Upper Extremity Assessment: Generalized weakness;Defer to OT evaluation           Lower Extremity Assessment: Generalized weakness (limited knee flex/ankle DF in sitting and suping)         Communication   Communication: No difficulties  Cognition Arousal/Alertness: Awake/alert Behavior During Therapy: WFL for tasks assessed/performed Overall Cognitive Status: No family/caregiver present to determine baseline cognitive functioning (appears appropriate and accurate for tasks assessed)                      General Comments General comments (skin integrity, edema, etc.): heel pain; floated heels which relieved pain; dressing to left 3rd toe intact    Exercises General Exercises - Lower Extremity Ankle Circles/Pumps: AROM;Both;10 reps;Supine Quad Sets: AROM;Both;10 reps;Supine Long Arc Quad: AROM;Both;Seated      Assessment/Plan    PT Assessment Patient needs continued PT services  PT Diagnosis Difficulty walking;Generalized weakness   PT Problem List Decreased strength;Decreased range of motion;Decreased activity tolerance;Decreased balance;Decreased mobility;Decreased safety awareness;Decreased knowledge of precautions;Cardiopulmonary status limiting  activity  PT Treatment Interventions Patient/family education;Balance training;Therapeutic exercise;Therapeutic activities;Functional mobility training;Gait training;DME instruction   PT Goals (Current goals can be found in the Care Plan section) Acute Rehab PT Goals Patient Stated Goal: move with less effort PT Goal Formulation: With patient Time For Goal Achievement: 01/24/15 Potential to Achieve Goals: Fair    Frequency Min 3X/week   Barriers to discharge Decreased caregiver support      Co-evaluation               End of Session Equipment Utilized During Treatment: Gait belt Activity Tolerance: Patient limited by fatigue Patient left: in bed;in chair;with call bell/phone within reach Nurse Communication: Mobility status;Precautions         Time: 1610-9604 PT Time Calculation (min) (ACUTE ONLY): 32 min   Charges:   PT Evaluation $Initial PT Evaluation Tier I: 1 Procedure PT Treatments $Therapeutic Activity: 23-37 mins   PT G Codes:        Dennis Bast 01/10/2015, 11:03 AM

## 2015-01-11 ENCOUNTER — Ambulatory Visit: Payer: 59 | Admitting: Nurse Practitioner

## 2015-01-11 LAB — GLUCOSE, CAPILLARY: Glucose-Capillary: 128 mg/dL — ABNORMAL HIGH (ref 70–99)

## 2015-01-11 NOTE — Progress Notes (Signed)
Social worker called RT for patient CPAP settings.  She has an IPAP of 6 and an EPAP of 6 with a rate of 12

## 2015-01-11 NOTE — Progress Notes (Signed)
[facility]. Used SBAR to give report. Opportunity to ask questions was given and call back number provided. Ambulance has been called for transport.

## 2015-01-11 NOTE — Progress Notes (Signed)
[facility] PROGRESS NOTE  Filed Weights   01/09/15 0512 01/10/15 0606 01/11/15 [id number]  Weight: 74.2 kg (163 lb 9.3 oz) 78 kg (171 lb 15.3 oz) 77.921 kg (171 lb 12.6 oz)        Intake/Output Summary (Last 24 hours) at 01/11/15 1053 Last data filed at 01/11/15 1051  Gross per 24 hour  Intake    780 ml  Output   2550 ml  Net  -1770 ml     Assessment/Plan: Acute on chronic diastolic heart failure: - She was in no no known drug the family now agreeable to comfort care. Cardiology recommended Lasix 80 mg by mouth twice a day upon discharge. - No further changes made to her medications no further changes overnight.   Paroxysmal atrial fibrillation: Continue Eluquis. She's currently wearing control.  Hyponatremia: Likely related to volume overload so her sodium is trending up.  Controlled diabetes mellitus: No changes were made.  Anemia of chronic disease: Abdomen has remained stable.  Acute on chronic kidney disease stage IV: Baseline creatinine 1.5. She will continue on Lasix.  Urinary retention: - Will go facility with Foley.  Code Status: DNR/DNI Family Communication: plan of care discussed with the patient Disposition Plan: SNF today   HPI/Subjective: No complains  Objective: Filed Vitals:   01/10/15 1100 01/10/15 1501 01/10/15 2107 01/11/15 0608  BP: 104/66 100/54 101/67 105/69  Pulse: 96 97 91 89  Temp:  97.3 F (36.3 C) 97.5 F (36.4 C) 97.8 F (36.6 C)  TempSrc:  Oral Oral Oral  Resp: 18 20 20 20   Height:      Weight:    77.921 kg (171 lb 12.6 oz)  SpO2: 96% 99% 100% 100%     Exam:  General: Alert, awake, oriented x3, in no acute distress.  HEENT: No bruits, no goiter.  Heart: Regular rate and rhythm. Lungs: Good air movement,clear Abdomen: Soft, nontender, nondistended, positive bowel sounds.  Neuro: Grossly intact, nonfocal.   Data Reviewed: Basic Metabolic Panel:  Recent Labs Lab 01/06/15 1404 01/07/15 0240 01/08/15 0437  01/09/15 0347 01/10/15 0620  NA 124* 124* 127* 132* 131*  K 4.3 4.4 4.4 4.0 3.1*  CL 90* 88* 92* 95* 93*  CO2 28 25 26 31 27   GLUCOSE 186* 128* 123* 120* 106*  BUN 63* 64* 65* 58* 50*  CREATININE 1.63* 1.75* 1.74* 1.66* 1.46*  CALCIUM 9.0 9.2 8.4 8.2* 8.5   Liver Function Tests: No results for input(s): AST, ALT, ALKPHOS, BILITOT, PROT, ALBUMIN in the last 168 hours. No results for input(s): LIPASE, AMYLASE in the last 168 hours.  Recent Labs Lab 01/08/15 1141  AMMONIA 24   CBC:  Recent Labs Lab 01/07/15 0240 01/08/15 0437 01/10/15 0620  WBC 11.1* 10.5 9.4  NEUTROABS 8.1*  --   --   HGB 11.3* 9.9* 10.1*  HCT 34.7* 30.3* 30.1*  MCV 95.3 94.1 92.6  PLT 191 181 178   Cardiac Enzymes: No results for input(s): CKTOTAL, CKMB, CKMBINDEX, TROPONINI in the last 168 hours. BNP (last 3 results)  Recent Labs  12/27/14 0344 12/30/14 0924 01/07/15 0240  BNP 1339.0* 1176.8* 1342.9*    ProBNP (last 3 results)  Recent Labs  02/12/14 1421 07/22/14 1213 01/06/15 1404  PROBNP 1542.0* 1209.0* 1412.0*    CBG:  Recent Labs Lab 01/09/15 2107 01/10/15 0605 01/10/15 1118 01/10/15 1621 01/10/15 2109  GLUCAP 153* 113* 160* 153* 148*    No results found for this or any previous visit (from the past  240 hour(s)).   Studies: No results found.  Scheduled Meds: . allopurinol  300 mg Oral Daily  . apixaban  2.5 mg Oral BID  . atorvastatin  10 mg Oral q1800  . cadexomer iodine  1 application Topical QODAY  . calcium-vitamin D  1 tablet Oral Q breakfast  . furosemide  80 mg Intravenous Q12H  . gabapentin  300 mg Oral QHS  . levothyroxine  112 mcg Oral QAC breakfast  . multivitamin with minerals  1 tablet Oral Daily  . silver sulfADIAZINE  1 application Topical QODAY  . sodium chloride  3 mL Intravenous Q12H   Continuous Infusions:    Marinda Elk  [facility] Pager [phone] If 7PM-7AM, please contact night-coverage at [email or web address], password  [facility] 01/11/2015, 10:53 AM  LOS: 4 days

## 2015-01-14 ENCOUNTER — Non-Acute Institutional Stay (SKILLED_NURSING_FACILITY): Payer: Medicare Other | Admitting: Internal Medicine

## 2015-01-14 DIAGNOSIS — E038 Other specified hypothyroidism: Secondary | ICD-10-CM

## 2015-01-14 DIAGNOSIS — N179 Acute kidney failure, unspecified: Secondary | ICD-10-CM

## 2015-01-14 DIAGNOSIS — D638 Anemia in other chronic diseases classified elsewhere: Secondary | ICD-10-CM

## 2015-01-14 DIAGNOSIS — N184 Chronic kidney disease, stage 4 (severe): Secondary | ICD-10-CM | POA: Diagnosis not present

## 2015-01-14 DIAGNOSIS — E034 Atrophy of thyroid (acquired): Secondary | ICD-10-CM | POA: Diagnosis not present

## 2015-01-14 DIAGNOSIS — I482 Chronic atrial fibrillation, unspecified: Secondary | ICD-10-CM

## 2015-01-14 DIAGNOSIS — I739 Peripheral vascular disease, unspecified: Secondary | ICD-10-CM

## 2015-01-14 DIAGNOSIS — E1140 Type 2 diabetes mellitus with diabetic neuropathy, unspecified: Secondary | ICD-10-CM

## 2015-01-14 DIAGNOSIS — I5033 Acute on chronic diastolic (congestive) heart failure: Secondary | ICD-10-CM | POA: Diagnosis not present

## 2015-01-14 DIAGNOSIS — E871 Hypo-osmolality and hyponatremia: Secondary | ICD-10-CM | POA: Diagnosis not present

## 2015-01-14 DIAGNOSIS — E7800 Pure hypercholesterolemia, unspecified: Secondary | ICD-10-CM

## 2015-01-14 DIAGNOSIS — R339 Retention of urine, unspecified: Secondary | ICD-10-CM | POA: Diagnosis not present

## 2015-01-14 NOTE — Progress Notes (Signed)
MRN: [id number] Name: [name]  Sex: female Age: 81 y.o. DOB: 1923/11/23  PSC #: Sonny Dandy Facility/Room:312 Level Of Care: SNF Provider: Merrilee Seashore D Emergency Contacts: Extended Emergency Contact Information Primary Emergency Contact: Gevena Cotton [place] of [place] Mobile Phone: [phone] Relation: Daughter Secondary Emergency Contact: Liliane Shi Address: [address]          [place], [place] [address] [place] of [place] Home Phone: [phone] Work Phone: [phone] Mobile Phone: [phone] Relation: Daughter  Code Status: DNR  Allergies: Ambien; Aspirin; Nexium; and Penicillins  Chief Complaint  Patient presents with  . New Admit To SNF    HPI: Patient is 81 y.o. female who was hospitalized with scute on chronic CHF, requiring milrinone drip until family decided pt would be comfort care and she was d/c to SNF.  Past Medical History  Diagnosis Date  . Hypertension   . Peripheral neuropathy   . Asthma   . Diabetes with neurologic complications   . PAD (peripheral artery disease)   . Enlarged heart   . Anginal pain 05/28/12  . Pneumonia 11/2010  . Sleep apnea     cpap  . Hypothyroidism   . Headache(784.0) 05/29/12    "recently"  . Stroke 05/2005; 08/2010    !mini"  . Personal history of gout   . Hypothyroidism   . DJD (degenerative joint disease)   . Carpal tunnel syndrome, bilateral   . Recurrent labyrinthitis   . GERD (gastroesophageal reflux disease)     S/P esophageal dilation in 1996  . Gallstone     Silent  . Fibrocystic breast changes   . Hemorrhoids     with bleeding  . Hx of adenomatous colonic polyps     Dr Sherin Quarry  . Diverticulitis     left side  . MR (mitral regurgitation)     moderate  . Mild anemia     hemoglobin 11.8 on 06/2009  . Edema of lower extremity     Chronic  . Ischemic colitis 8/13  . Aortic stenosis, moderate   . Chronic diastolic CHF (congestive heart failure)   . Moderate  mitral regurgitation   . CKD (chronic kidney disease) stage 3, GFR 30-59 ml/min     "something on labs always say she has some problems"  . Chronic atrial fibrillation   . Peripheral arterial disease     critical limb ischemia, right third toe  . High cholesterol     Past Surgical History  Procedure Laterality Date  . Tonsillectomy and adenoidectomy  1960's  . Dilation and curettage of uterus    . Vaginal hysterectomy  1970's  . Total knee arthroplasty  1970-~2002    left; right  . Cataract extraction w/ intraocular lens  implant, bilateral  ?1970's  . Kyphoplasty N/A     L2      Medication List       This list is accurate as of: 01/14/15 11:59 PM.  Always use your most recent med list.               acetaminophen 500 MG tablet  Commonly known as:  TYLENOL  Take 500 mg by mouth at bedtime. Give with the Tramadol 50 mg     allopurinol 300 MG tablet  Commonly known as:  ZYLOPRIM  Take 1 tablet (300 mg total) by mouth daily.     amiodarone 200 MG tablet  Commonly known as:  PACERONE  Take 1 tablet (200 mg total) by mouth 2 (two)  times daily.     apixaban 2.5 MG Tabs tablet  Commonly known as:  ELIQUIS  Take 1 tablet (2.5 mg total) by mouth 2 (two) times daily.     atorvastatin 10 MG tablet  Commonly known as:  LIPITOR  Take 1 tablet (10 mg total) by mouth daily.     cadexomer iodine 0.9 % gel  Commonly known as:  IODOSORB  Apply 1 application topically daily. For wound dressing on toe. Alternate days used with silver sulfadiazine cream.     Calcium-Vitamin D 600-200 MG-UNIT Caps  Take 1 tablet by mouth daily.     furosemide 40 MG tablet  Commonly known as:  LASIX  Take two tablets (80 mg) by mouth twice daily     gabapentin 300 MG capsule  Commonly known as:  NEURONTIN  Take 1 capsule (300 mg total) by mouth at bedtime.     ketoconazole 2 % cream  Commonly known as:  NIZORAL     levothyroxine 112 MCG tablet  Commonly known as:  SYNTHROID, LEVOTHROID   Take 112 mcg by mouth daily before breakfast.     morphine CONCENTRATE 10 MG/0.5ML Soln concentrated solution  Take 0.25 mLs (5 mg total) by mouth every 2 (two) hours as needed for shortness of breath.     multivitamin with minerals tablet  Take 1 tablet by mouth daily.     ONE TOUCH ULTRA TEST test strip  Generic drug:  glucose blood  1 each by Other route See admin instructions. Check blood sugar once daily.     oxyCODONE 5 MG immediate release tablet  Commonly known as:  Oxy IR/ROXICODONE  Take 1 tablet (5 mg total) by mouth every 4 (four) hours as needed for moderate pain.     silver sulfADIAZINE 1 % cream  Commonly known as:  SILVADENE  Apply 1 application topically daily.        No orders of the defined types were placed in this encounter.    Immunization History  Administered Date(s) Administered  . Influenza Split 06/30/2012  . Influenza-Unspecified 07/15/2014    History  Substance Use Topics  . Smoking status: Never Smoker   . Smokeless tobacco: Never Used  . Alcohol Use: No    Family history is noncontributory    Review of Systems  DATA OBTAINED: from patient; sitting very still GENERAL:  no fevers, +fatigue SKIN: No itching, rash or wounds EYES: No eye pain, redness, discharge EARS: No earache, tinnitus, change in hearing NOSE: No congestion, drainage or bleeding  MOUTH/THROAT: No mouth or tooth pain, No sore throat RESPIRATORY: No cough, wheezing, +SOB, no more than usual CARDIAC: No chest pain, palpitations, lower extremity edema  GI: No abdominal pain, No N/V/D or constipation, No heartburn or reflux  GU: No dysuria, frequency or urgency, or incontinence  MUSCULOSKELETAL: No unrelieved bone/joint pain NEUROLOGIC: No headache, dizziness or focal weakness PSYCHIATRIC: No overt anxiety or sadness, No behavior issue.   Filed Vitals:   01/14/15 1440  BP: 100/71  Pulse: 103  Temp: 99.6 F (37.6 C)  Resp: 20    Physical Exam  GENERAL  APPEARANCE: Alert, NAD.  SKIN: No diaphoresis rash HEAD: Normocephalic, atraumatic  EYES: Conjunctiva/lids clear. Pupils round, reactive. EOMs intact.  EARS: External exam WNL, canals clear. Hearing grossly normal.  NOSE: No deformity or discharge.  MOUTH/THROAT: Lips w/o lesions  RESPIRATORY: Breathing is even, unlabored. Lung sounds are diffusely decreased CARDIOVASCULAR: Heart RRR no murmurs, rubs or gallops. trace peripheral edema.  GASTROINTESTINAL: Abdomen is soft, non-tender, not distended w/ normal bowel sounds. GENITOURINARY: Bladder non tender, not distended  MUSCULOSKELETAL: No abnormal joints or musculature NEUROLOGIC:  Cranial nerves 2-12 grossly intact. Moves all extremities  PSYCHIATRIC: Mood and affect appropriate to situation, no behavioral issues  Patient Active Problem List   Diagnosis Date Noted  . Anemia of chronic disease 01/17/2015  . High cholesterol   . CKD (chronic kidney disease)   . Palliative care encounter 01/08/2015  . Urinary retention 01/07/2015  . Hyponatremia with excess extracellular fluid volume 01/07/2015  . Acute on chronic diastolic congestive heart failure 01/07/2015  . Atrial tachycardia 01/07/2015  . Cardiorenal syndrome with renal failure 01/07/2015  . RVF (right ventricular failure) 01/07/2015  . Mitral stenosis 01/07/2015  . Severe tricuspid regurgitation 01/07/2015  . Acute renal failure superimposed on stage 4 chronic kidney disease   . Hypotension 01/06/2015  . Acute renal failure syndrome   . SOB (shortness of breath)   . Acute kidney injury 12/26/2014  . UTI (lower urinary tract infection) 12/26/2014  . Hyperkalemia 12/26/2014  . Diastolic CHF, acute on chronic 12/26/2014  . Elevated troponin I level 12/26/2014  . Peripheral arterial disease 09/09/2014  . DNR (do not resuscitate) 02/12/2014  . Hyponatremia 02/04/2014  . AKI (acute kidney injury) 02/04/2014  . Weakness 02/03/2014  . CHF (congestive heart failure) 01/12/2014   . ASD (atrial septal defect)   . Aortic stenosis, moderate   . Moderate mitral regurgitation   . HTN (hypertension) 10/27/2013  . Aortic valve disorders 10/27/2013  . Gout 10/02/2013  . Abnormality of gait 04/17/2013  . Abnormal PFT 01/28/2013  . Diabetes with neurologic complications   . Physical deconditioning 06/06/2012  . OSA (obstructive sleep apnea) 06/04/2012  . Chronic atrial fibrillation   . Diastolic heart failure, NYHA class 3   . Hypothyroidism   . Asthma     CBC    Component Value Date/Time   WBC 9.4 01/10/2015 0620   RBC 3.25* 01/10/2015 0620   HGB 10.1* 01/10/2015 0620   HCT 30.1* 01/10/2015 0620   PLT 178 01/10/2015 0620   MCV 92.6 01/10/2015 0620   LYMPHSABS 1.8 01/07/2015 0240   MONOABS 1.1* 01/07/2015 0240   EOSABS 0.1 01/07/2015 0240   BASOSABS 0.0 01/07/2015 0240    CMP     Component Value Date/Time   NA 131* 01/10/2015 0620   K 3.1* 01/10/2015 0620   CL 93* 01/10/2015 0620   CO2 27 01/10/2015 0620   GLUCOSE 106* 01/10/2015 0620   BUN 50* 01/10/2015 0620   CREATININE 1.46* 01/10/2015 0620   CALCIUM 8.5 01/10/2015 0620   PROT 5.9* 12/27/2014 1013   ALBUMIN 2.7* 12/27/2014 1013   AST 33 12/27/2014 1013   ALT 21 12/27/2014 1013   ALKPHOS 96 12/27/2014 1013   BILITOT 0.7 12/27/2014 1013   GFRNONAA 31* 01/10/2015 0620   GFRAA 36* 01/10/2015 0620    Assessment and Plan  Acute on chronic diastolic congestive heart failure Patient has severe biventricular heart failure in the setting of severe valvular heart disease - 2 D ECHO 12/28/14 showed EF 60-65%, with intermittent diastolic dysfunction (atrial fibrillation), moderate aortic stenosis, pulmonary hypertension, moderate MS/MR. - BNP 1342 - As noted above, she was on milrinone drip but family now agreeable to comfort approach so milrinone drip stopped. - Per cardiology, will continue Lasix 80 mg PO BID upon discharge  - Weight in past 24 hours: 166 lbs --> 171 lbs this AM   Chronic  atrial fibrillation ChadsVasc score 6 - Continue anticoagulation with Eliquis - Rate controlled, she is not on BB at present since BP on low end of normal    Hyponatremia Most likely related to volume overload, CHF etiology - Sodium trended up 124 --> 131   Hypothyroidism Continue synthroid 112 mcg daily   Diabetes with neurologic complications No recent A1c on file but in 2015 controlled - on SSI while inpatient only and no need for outpatient insulin at this time     Anemia of chronic disease - Hemoglobin dropped from 11.3 to 9.9 but up to 10.1 this AM - No reports of bleeding    Acute renal failure superimposed on stage 4 chronic kidney disease - Baseline creatinine 1.5 and on this admission 1.7 - She is on lasix which could worsen renal function - Cr is trending down and is at baseline this AM    High cholesterol Continue statin     Urinary retention 250 mL after Foley catheter insertion, Foley inserted, may go to SNF with foley and have it removed there     Peripheral arterial disease - Nonhealing wound on the right third toe - Continue with wound care      Margit Hanks, MD

## 2015-01-17 ENCOUNTER — Encounter: Payer: Self-pay | Admitting: Internal Medicine

## 2015-01-17 DIAGNOSIS — E7800 Pure hypercholesterolemia, unspecified: Secondary | ICD-10-CM | POA: Insufficient documentation

## 2015-01-17 DIAGNOSIS — D638 Anemia in other chronic diseases classified elsewhere: Secondary | ICD-10-CM | POA: Insufficient documentation

## 2015-01-17 NOTE — Assessment & Plan Note (Signed)
-   Baseline creatinine 1.5 and on this admission 1.7 - She is on lasix which could worsen renal function - Cr is trending down and is at baseline this AM

## 2015-01-17 NOTE — Assessment & Plan Note (Signed)
No recent A1c on file but in 2015 controlled - on SSI while inpatient only and no need for outpatient insulin at this time

## 2015-01-17 NOTE — Assessment & Plan Note (Signed)
Continue statin. 

## 2015-01-17 NOTE — Assessment & Plan Note (Signed)
ChadsVasc score 6 - Continue anticoagulation with Eliquis - Rate controlled, she is not on BB at present since BP on low end of normal

## 2015-01-17 NOTE — Assessment & Plan Note (Signed)
Continue synthroid 112 mcg daily

## 2015-01-17 NOTE — Assessment & Plan Note (Signed)
-   Nonhealing wound on the right third toe - Continue with wound care

## 2015-01-17 NOTE — Assessment & Plan Note (Signed)
250 mL after Foley catheter insertion, Foley inserted, may go to SNF with foley and have it removed there

## 2015-01-17 NOTE — Assessment & Plan Note (Signed)
Most likely related to volume overload, CHF etiology - Sodium trended up 124 --> 131

## 2015-01-17 NOTE — Assessment & Plan Note (Signed)
Patient has severe biventricular heart failure in the setting of severe valvular heart disease - 2 D ECHO 12/28/14 showed EF 60-65%, with intermittent diastolic dysfunction (atrial fibrillation), moderate aortic stenosis, pulmonary hypertension, moderate MS/MR. - BNP 1342 - As noted above, she was on milrinone drip but family now agreeable to comfort approach so milrinone drip stopped. - Per cardiology, will continue Lasix 80 mg PO BID upon discharge  - Weight in past 24 hours: 166 lbs --> 171 lbs this AM

## 2015-01-17 NOTE — Assessment & Plan Note (Signed)
-   Hemoglobin dropped from 11.3 to 9.9 but up to 10.1 this AM - No reports of bleeding

## 2015-01-22 ENCOUNTER — Non-Acute Institutional Stay (SKILLED_NURSING_FACILITY): Payer: Medicare Other | Admitting: Internal Medicine

## 2015-01-22 DIAGNOSIS — N184 Chronic kidney disease, stage 4 (severe): Secondary | ICD-10-CM | POA: Diagnosis not present

## 2015-01-22 DIAGNOSIS — I5033 Acute on chronic diastolic (congestive) heart failure: Secondary | ICD-10-CM

## 2015-01-22 NOTE — Progress Notes (Signed)
MRN: [id number] Name: [name]  Sex: female Age: 81 y.o. DOB: 1924-10-22  [facility]: [facility] Facility/Room:  Level Of Care: SNF Provider: Merrilee SeashoreALEXANDER, Tyresse Jayson D Emergency Contacts: Extended Emergency Contact Information Primary Emergency Contact: Gevena CottonEdwards,[name]  [place] Mobile Phone: [phone] Relation: Daughter Secondary Emergency Contact: Liliane ShiSellers,Teresa Address: [address]          [place], [place] [address] [place] Home Phone: [phone] Work Phone: [phone] Mobile Phone: [phone] Relation: Daughter  Code Status: DNR  Allergies: Ambien; Aspirin; Nexium; and Penicillins  Chief Complaint  Patient presents with  . Acute Visit    HPI: Patient is 81 y.o. female who nursing has asked me to see for 2+ LE edema.  Past Medical History  Diagnosis Date  . Hypertension   . Peripheral neuropathy   . Asthma   . Diabetes with neurologic complications   . PAD (peripheral artery disease)   . Enlarged heart   . Anginal pain 05/28/12  . Pneumonia 11/2010  . Sleep apnea     cpap  . Hypothyroidism   . Headache(784.0) 05/29/12    "recently"  . Stroke 05/2005; 08/2010    !mini"  . Personal history of gout   . Hypothyroidism   . DJD (degenerative joint disease)   . Carpal tunnel syndrome, bilateral   . Recurrent labyrinthitis   . GERD (gastroesophageal reflux disease)     S/P esophageal dilation in 1996  . Gallstone     Silent  . Fibrocystic breast changes   . Hemorrhoids     with bleeding  . Hx of adenomatous colonic polyps     Dr Sherin QuarryWeissman  . Diverticulitis     left side  . MR (mitral regurgitation)     moderate  . Mild anemia     hemoglobin 11.8 on 06/2009  . Edema of lower extremity     Chronic  . Ischemic colitis 8/13  . Aortic stenosis, moderate   . Chronic diastolic CHF (congestive heart failure)   . Moderate mitral regurgitation   . CKD (chronic kidney disease) stage 3, GFR 30-59 ml/min     "something on labs  always say she has some problems"  . Chronic atrial fibrillation   . Peripheral arterial disease     critical limb ischemia, right third toe  . High cholesterol     Past Surgical History  Procedure Laterality Date  . Tonsillectomy and adenoidectomy  1960's  . Dilation and curettage of uterus    . Vaginal hysterectomy  1970's  . Total knee arthroplasty  1970-~2002    left; right  . Cataract extraction w/ intraocular lens  implant, bilateral  ?1970's  . Kyphoplasty N/A     L2      Medication List       This list is accurate as of: 01/22/15 11:59 PM.  Always use your most recent med list.               acetaminophen 500 MG tablet  Commonly known as:  TYLENOL  Take 500 mg by mouth at bedtime. Give with the Tramadol 50 mg     allopurinol 300 MG tablet  Commonly known as:  ZYLOPRIM  Take 1 tablet (300 mg total) by mouth daily.     amiodarone 200 MG tablet  Commonly known as:  PACERONE  Take 1 tablet (200 mg total) by mouth 2 (two) times daily.     apixaban 2.5 MG Tabs tablet  Commonly known as:  ELIQUIS  Take 1 tablet (2.5 mg total) by mouth 2 (two) times daily.     atorvastatin 10 MG tablet  Commonly known as:  LIPITOR  Take 1 tablet (10 mg total) by mouth daily.     cadexomer iodine 0.9 % gel  Commonly known as:  IODOSORB  Apply 1 application topically daily. For wound dressing on toe. Alternate days used with silver sulfadiazine cream.     Calcium-Vitamin D 600-200 MG-UNIT Caps  Take 1 tablet by mouth daily.     furosemide 40 MG tablet  Commonly known as:  LASIX  Take two tablets (80 mg) by mouth twice daily     gabapentin 300 MG capsule  Commonly known as:  NEURONTIN  Take 1 capsule (300 mg total) by mouth at bedtime.     ketoconazole 2 % cream  Commonly known as:  NIZORAL     levothyroxine 112 MCG tablet  Commonly known as:  SYNTHROID, LEVOTHROID  Take 112 mcg by mouth daily before breakfast.     morphine CONCENTRATE 10 MG/0.5ML Soln concentrated  solution  Take 0.25 mLs (5 mg total) by mouth every 2 (two) hours as needed for shortness of breath.     multivitamin with minerals tablet  Take 1 tablet by mouth daily.     ONE TOUCH ULTRA TEST test strip  Generic drug:  glucose blood  1 each by Other route See admin instructions. Check blood sugar once daily.     oxyCODONE 5 MG immediate release tablet  Commonly known as:  Oxy IR/ROXICODONE  Take 1 tablet (5 mg total) by mouth every 4 (four) hours as needed for moderate pain.     silver sulfADIAZINE 1 % cream  Commonly known as:  SILVADENE  Apply 1 application topically daily.        No orders of the defined types were placed in this encounter.    Immunization History  Administered Date(s) Administered  . Influenza Split 06/30/2012  . Influenza-Unspecified 07/15/2014    History  Substance Use Topics  . Smoking status: Never Smoker   . Smokeless tobacco: Never Used  . Alcohol Use: No    Review of Systems  DATA OBTAINED: from patient, nurse, medical record,dietary GENERAL:  no fevers, fatigue, appetite changes; dietary has documented weight gain in past week;pt weighs 183 pounds SKIN: No itching, rash HEENT: No complaint RESPIRATORY: No cough, wheezing, SOB CARDIAC: No chest pain, palpitations,+ lower extremity edema  GI: No abdominal pain, No N/V/D or constipation, No heartburn or reflux  GU: No dysuria, frequency or urgency, or incontinence  MUSCULOSKELETAL: No unrelieved bone/joint pain NEUROLOGIC: No headache, dizziness  PSYCHIATRIC: No overt anxiety or sadness  Filed Vitals:   01/22/15 1901  BP: 115/58  Pulse: 57  Temp: 96.3 F (35.7 C)  Resp: 18    Physical Exam  GENERAL APPEARANCE: Alert, conversant, No acute distress  SKIN: No diaphoresis rash HEENT: Unremarkable RESPIRATORY: Breathing is even, unlabored. Lung sounds are clear   CARDIOVASCULAR: Heart RRR no murmurs, rubs or gallops. 2+ peripheral edema  GASTROINTESTINAL: Abdomen is soft,  non-tender, not distended w/ normal bowel sounds.  GENITOURINARY: Bladder non tender, not distended  MUSCULOSKELETAL: No abnormal joints or musculature NEUROLOGIC: Cranial nerves 2-12 grossly intact. Moves all extremities PSYCHIATRIC: Mood and affect appropriate to situation, no behavioral issues  Patient Active Problem List   Diagnosis Date Noted  . Anemia of chronic disease 01/17/2015  . High cholesterol   . CKD (chronic kidney disease) stage 4, GFR  15-29 ml/min   . Palliative care encounter 01/08/2015  . Urinary retention 01/07/2015  . Hyponatremia with excess extracellular fluid volume 01/07/2015  . Acute on chronic diastolic congestive heart failure 01/07/2015  . Atrial tachycardia 01/07/2015  . Cardiorenal syndrome with renal failure 01/07/2015  . RVF (right ventricular failure) 01/07/2015  . Mitral stenosis 01/07/2015  . Severe tricuspid regurgitation 01/07/2015  . Acute renal failure superimposed on stage 4 chronic kidney disease   . Hypotension 01/06/2015  . Acute renal failure syndrome   . SOB (shortness of breath)   . Acute kidney injury 12/26/2014  . UTI (lower urinary tract infection) 12/26/2014  . Hyperkalemia 12/26/2014  . Diastolic CHF, acute on chronic 12/26/2014  . Elevated troponin I level 12/26/2014  . Peripheral arterial disease 09/09/2014  . DNR (do not resuscitate) 02/12/2014  . Hyponatremia 02/04/2014  . AKI (acute kidney injury) 02/04/2014  . Weakness 02/03/2014  . CHF (congestive heart failure) 01/12/2014  . ASD (atrial septal defect)   . Aortic stenosis, moderate   . Moderate mitral regurgitation   . HTN (hypertension) 10/27/2013  . Aortic valve disorders 10/27/2013  . Gout 10/02/2013  . Abnormality of gait 04/17/2013  . Abnormal PFT 01/28/2013  . Diabetes with neurologic complications   . Physical deconditioning 06/06/2012  . OSA (obstructive sleep apnea) 06/04/2012  . Chronic atrial fibrillation   . Diastolic heart failure, NYHA class 3   .  Hypothyroidism   . Asthma     CBC    Component Value Date/Time   WBC 9.4 01/10/2015 0620   RBC 3.25* 01/10/2015 0620   HGB 10.1* 01/10/2015 0620   HCT 30.1* 01/10/2015 0620   PLT 178 01/10/2015 0620   MCV 92.6 01/10/2015 0620   LYMPHSABS 1.8 01/07/2015 0240   MONOABS 1.1* 01/07/2015 0240   EOSABS 0.1 01/07/2015 0240   BASOSABS 0.0 01/07/2015 0240    CMP     Component Value Date/Time   NA 131* 01/10/2015 0620   K 3.1* 01/10/2015 0620   CL 93* 01/10/2015 0620   CO2 27 01/10/2015 0620   GLUCOSE 106* 01/10/2015 0620   BUN 50* 01/10/2015 0620   CREATININE 1.46* 01/10/2015 0620   CALCIUM 8.5 01/10/2015 0620   PROT 5.9* 12/27/2014 1013   ALBUMIN 2.7* 12/27/2014 1013   AST 33 12/27/2014 1013   ALT 21 12/27/2014 1013   ALKPHOS 96 12/27/2014 1013   BILITOT 0.7 12/27/2014 1013   GFRNONAA 31* 01/10/2015 0620   GFRAA 36* 01/10/2015 0620    Assessment and Plan  Acute on chronic diastolic congestive heart failure Pt has gained to 183 pounds-she was reported 170 pounds at d/c. She has 2+ edema. BUN/Cr today 50/1.86. Have changed Lasix 80 mg BID to demeadex 20 mg BID. Will continue to watch weights and have BMP repeat in 3-5 days.   CKD (chronic kidney disease) stage 4, GFR 15-29 ml/min Pt's BUN/Cr today 50/1.86; on d/c was 50/1.4. Have changed diuretic to demadex. Will follow BUN/Cr. Pt has gained weight and has 2+ pedal edema.     Margit Hanks, MD

## 2015-01-24 ENCOUNTER — Encounter: Payer: Self-pay | Admitting: Internal Medicine

## 2015-01-24 NOTE — Assessment & Plan Note (Addendum)
Pt has gained to 183 pounds-she was reported 170 pounds at d/c. She has 2+ edema. BUN/Cr today 50/1.86. Have changed Lasix 80 mg BID to demeadex 20 mg BID. Will titrate up depending on result. Will continue to watch weights and have BMP repeat in 3-5 days.

## 2015-01-24 NOTE — Assessment & Plan Note (Signed)
Pt's BUN/Cr today 50/1.86; on d/c was 50/1.4. Have changed diuretic to demadex. Will follow BUN/Cr. Pt has gained weight and has 2+ pedal edema.

## 2015-02-03 ENCOUNTER — Encounter: Payer: Self-pay | Admitting: Podiatrist

## 2015-02-03 ENCOUNTER — Ambulatory Visit (INDEPENDENT_AMBULATORY_CARE_PROVIDER_SITE_OTHER): Payer: Medicare Other | Admitting: Podiatrist

## 2015-02-03 DIAGNOSIS — Q828 Other specified congenital malformations of skin: Secondary | ICD-10-CM

## 2015-02-03 DIAGNOSIS — L97514 Non-pressure chronic ulcer of other part of right foot with necrosis of bone: Secondary | ICD-10-CM

## 2015-02-03 DIAGNOSIS — I739 Peripheral vascular disease, unspecified: Secondary | ICD-10-CM

## 2015-02-03 NOTE — Progress Notes (Signed)
Cardiology Office Note   Date:  02/04/2015   ID:  SHALIN LINDERS, DOB August 21, 1924, MRN [id number]  PCP:  Ginette Otto, MD    Chief Complaint  Patient presents with  . Congestive Heart Failure  . Hypertension  . Atrial Fibrillation      History of Present Illness:[name] is a 81 y.o. female with a history of chronic diastolic CHF EF 55-60%, OSA on CPAP, HTN, chronic atrial fibrillation, moderate AS, mild pulmonary HTN, moderate MR and ASD who presents today for followup.  She has NYHA class III symptoms. She is doing well today. She denies any chest pain, palpitations or syncope. She has chronic LE edema which is stable. She recently got a new chin strap and new supplies. She was hospitalized in March for an acute CHF exacerbation and renal failure.  She is now in palliative Care and is currently at [facility].  Her daughter says that they are not very good about getting her CPAP mask on tight and it has been leaking.  She tolerates her nasal mask and pressure feels adequate but she is very sleepy during the day and was falling asleep during the office visit.  She denies any chest pain but does have chronic SOB but thinks it is better since discharge.  She is on 2L [place] O2 around the clock. She sleeps on 2-3 pillows at night and still is SOB some.  She has chronic LE edema which is worse and was seen in wound clinic yesterday for RLE leg ulcers.     Past Medical History  Diagnosis Date  . Hypertension   . Peripheral neuropathy   . Asthma   . Diabetes with neurologic complications   . PAD (peripheral artery disease)   . Enlarged heart   . Anginal pain 05/28/12  . Pneumonia 11/2010  . Sleep apnea     cpap  . Hypothyroidism   . Headache(784.0) 05/29/12    "recently"  . Stroke 05/2005; 08/2010    !mini"  . Personal history of gout   . Hypothyroidism   . DJD (degenerative joint disease)   . Carpal tunnel syndrome, bilateral   . Recurrent labyrinthitis   . GERD  (gastroesophageal reflux disease)     S/P esophageal dilation in 1996  . Gallstone     Silent  . Fibrocystic breast changes   . Hemorrhoids     with bleeding  . Hx of adenomatous colonic polyps     Dr Sherin Quarry  . Diverticulitis     left side  . MR (mitral regurgitation)     moderate  . Mild anemia     hemoglobin 11.8 on 06/2009  . Edema of lower extremity     Chronic  . Ischemic colitis 8/13  . Aortic stenosis, moderate   . Chronic diastolic CHF (congestive heart failure)   . Moderate mitral regurgitation   . CKD (chronic kidney disease) stage 3, GFR 30-59 ml/min     "something on labs always say she has some problems"  . Chronic atrial fibrillation   . Peripheral arterial disease     critical limb ischemia, right third toe  . High cholesterol     Past Surgical History  Procedure Laterality Date  . Tonsillectomy and adenoidectomy  1960's  . Dilation and curettage of uterus    . Vaginal hysterectomy  1970's  . Total knee arthroplasty  1970-~2002    left; right  . Cataract extraction w/ intraocular lens  implant, bilateral  ?  1970's  . Kyphoplasty N/A     L2     Current Outpatient Prescriptions  Medication Sig Dispense Refill  . acetaminophen (TYLENOL) 500 MG tablet Take 500 mg by mouth at bedtime. Give with the Tramadol 50 mg    . allopurinol (ZYLOPRIM) 300 MG tablet Take 1 tablet (300 mg total) by mouth daily. 60 tablet 1  . amiodarone (PACERONE) 200 MG tablet Take 1 tablet (200 mg total) by mouth 2 (two) times daily. 60 tablet 0  . apixaban (ELIQUIS) 2.5 MG TABS tablet Take 1 tablet (2.5 mg total) by mouth 2 (two) times daily. 60 tablet 0  . atorvastatin (LIPITOR) 10 MG tablet Take 1 tablet (10 mg total) by mouth daily. (Patient taking differently: Take 10 mg by mouth daily at 6 PM. ) 30 tablet 1  . cadexomer iodine (IODOSORB) 0.9 % gel Apply 1 application topically daily. For wound dressing on toe. Alternate days used with silver sulfadiazine cream.    . Calcium  Carbonate-Vitamin D (CALCIUM-VITAMIN D) 600-200 MG-UNIT CAPS Take 1 tablet by mouth daily.     Marland Kitchen gabapentin (NEURONTIN) 300 MG capsule Take 1 capsule (300 mg total) by mouth at bedtime. 30 capsule 1  . ketoconazole (NIZORAL) 2 % cream     . levothyroxine (SYNTHROID, LEVOTHROID) 112 MCG tablet Take 112 mcg by mouth daily before breakfast.    . Morphine Sulfate (MORPHINE CONCENTRATE) 10 MG/0.5ML SOLN concentrated solution Take 0.25 mLs (5 mg total) by mouth every 2 (two) hours as needed for shortness of breath. 42 mL 0  . Multiple Vitamins-Minerals (MULTIVITAMIN WITH MINERALS) tablet Take 1 tablet by mouth daily.    . ONE TOUCH ULTRA TEST test strip 1 each by Other route See admin instructions. Check blood sugar once daily.    Marland Kitchen oxyCODONE (OXY IR/ROXICODONE) 5 MG immediate release tablet Take 1 tablet (5 mg total) by mouth every 4 (four) hours as needed for moderate pain. 30 tablet 0  . silver sulfADIAZINE (SILVADENE) 1 % cream Apply 1 application topically daily. (Patient taking differently: Apply 1 application topically every other day. ) 50 g 0  . torsemide (DEMADEX) 20 MG tablet Take 20 mg by mouth 2 (two) times daily.     No current facility-administered medications for this visit.    Allergies:   Ambien; Aspirin; Nexium; and Penicillins    Social History:  The patient  reports that she has never smoked. She has never used smokeless tobacco. She reports that she does not drink alcohol or use illicit drugs.   Family History:  The patient's family history includes Heart disease in her brother, father, and mother.    ROS:  Please see the history of present illness.   Otherwise, review of systems are positive for none.   All other systems are reviewed and negative.    PHYSICAL EXAM: VS:  BP 120/64 mmHg  Pulse 71  Ht  (1.676 m)  Wt   SpO2 92% , BMI Body mass index is 0.00 kg/(m^2). GEN: Well nourished, well developed, in no acute distress HEENT: normal Neck: no JVD, carotid  bruits, or masses Cardiac: RRR; no murmurs, rubs, or gallops,no edema  Respiratory:  clear to auscultation bilaterally, normal work of breathing GI: soft, nontender, nondistended, + BS MS: no deformity or atrophy Skin: warm and dry, no rash Neuro:  Strength and sensation are intact Psych: euthymic mood, full affect   EKG:  EKG is not ordered today.    Recent Labs: 12/27/2014: ALT 21  01/06/2015: Pro B Natriuretic peptide (BNP) 1412.0* 01/07/2015: B Natriuretic Peptide 1342.9* 01/10/2015: BUN 50*; Creatinine 1.46*; Hemoglobin 10.1*; Platelets 178; Potassium 3.1*; Sodium 131*    Lipid Panel    Component Value Date/Time   CHOL  12/11/2010 0600    94        ATP III CLASSIFICATION:  <200     mg/dL   Desirable  161-096200-239  mg/dL   Borderline High  >=045>=240    mg/dL   High          TRIG 47 12/11/2010 0600   HDL 41 12/11/2010 0600   CHOLHDL 2.3 12/11/2010 0600   VLDL 9 12/11/2010 0600   LDLCALC  12/11/2010 0600    44        Total Cholesterol/HDL:CHD Risk Coronary Heart Disease Risk Table                     Men   Women  1/2 Average Risk   3.4   3.3  Average Risk       5.0   4.4  2 X Average Risk   9.6   7.1  3 X Average Risk  23.4   11.0        Use the calculated Patient Ratio above and the CHD Risk Table to determine the patient's CHD Risk.        ATP III CLASSIFICATION (LDL):  <100     mg/dL   Optimal  [id number]  mg/dL   Near or Above                    Optimal  130-159  mg/dL   Borderline  [id number]  mg/dL   High  >956>190     mg/dL   Very High      Wt Readings from Last 3 Encounters:  01/11/15 171 lb 12.6 oz (77.921 kg)  12/11/14 145 lb (65.772 kg)  09/09/14 149 lb (67.586 kg)    ASSESSMENT AND PLAN:  1. Chronic diastolic CHF NYHA class III symptoms.  She has worsening LE edema on exam today with some faint crackles at the left base.   - continue metoprolol/Lasix  - she is on a fluid restriction < 2L daily and low sodium diet which I stressed again the importance of  following. She knows to call if she gains more 3 lbs in one day for 5lbs in a week. Her weight is stable around 150lbs. 2. HTN - well controlled - continue metoprolol  3. ASD 4. Chronic atrial fibrillation rate controlled - continue apixaban/metoprolol/amiodarone  5. OSA on CPAP - her d/l today showed an AHI of 18.4/hr on 12cm H2O and 59% compliant in using more than 4 hours nightly.She has a significant mask leak noted on her download.   6. Moderate AS by echo 11/2014 (mild by gradient by severe by AVA and visually appears moderate) 7. Moderate MR/MS - She is asymptomatic. 8. Mild pulmonary HTN secondary to diastolic dysfunction/MR/AS/OSA    Current medicines are reviewed at length with the patient today.  The patient does not have concerns regarding medicines.  The following changes have been made:  no change  Labs/ tests ordered today include: see above assessment and plan No orders of the defined types were placed in this encounter.     Disposition:   FU with me in 6 months   Signed, Quintella ReichertURNER,Brette Cast R, MD  02/04/2015 3:04 PM    [facility] [address]  [address], [place], [place]  [address] Phone: [phone]; Fax: [phone]

## 2015-02-03 NOTE — Progress Notes (Signed)
   Patient presents today for follow up of ulceration on the tip of the right 3rd toe, lateral right fifth toe and medial first metatarsal head . She has been applying iodosorb to the ulcerations. She previously had a vascular study and a follow up appointment with Dr. York RamJonathan Barry who recommended no intervention due to her poor vascular supply and inabilty to do any type of intervention on these blocked vessels.    Objective: Neurovascular status unchanged. Pulses are non palpable with swelling to the right foot noted and neurological sensation is decreased. the ulceration on the distal tip of the right third toe is very much improved with decreased redness and swelling to the toe noted  Ulceration medial right first metatarsal head present measuring 5mm in diameter with fibrogranular base.  Large callus submet 1 left with hemorrhagic tissue underlying.  Once debrided intact integument is noted.    Assessment: ulcer down to bone distal tip of right 3rd toe-improving, ulcer lateral fifth toe, ulcer medial right foot, porokeratosis, PVD.  Plan:  Very light debridement of the callus of the first metatarsal head is accomplished today without complication. Recommended conservative care only.  Will continue iodosorb gel alternated with silvadene cream.  If she notices any redness, swelling, drainage, darkening discoloration or concerns, she is instructed to call.toenails debrided at today's visit without complication as well as the callus.  She will return in 4 weeks for follow up.

## 2015-02-04 ENCOUNTER — Encounter: Payer: Self-pay | Admitting: Cardiology

## 2015-02-04 ENCOUNTER — Other Ambulatory Visit: Payer: 59

## 2015-02-04 ENCOUNTER — Ambulatory Visit (INDEPENDENT_AMBULATORY_CARE_PROVIDER_SITE_OTHER): Payer: Medicare Other | Admitting: Cardiology

## 2015-02-04 VITALS — BP 120/64 | HR 71 | Ht 66.0 in

## 2015-02-04 DIAGNOSIS — I482 Chronic atrial fibrillation, unspecified: Secondary | ICD-10-CM

## 2015-02-04 DIAGNOSIS — I359 Nonrheumatic aortic valve disorder, unspecified: Secondary | ICD-10-CM | POA: Diagnosis not present

## 2015-02-04 DIAGNOSIS — I10 Essential (primary) hypertension: Secondary | ICD-10-CM | POA: Diagnosis not present

## 2015-02-04 DIAGNOSIS — I340 Nonrheumatic mitral (valve) insufficiency: Secondary | ICD-10-CM

## 2015-02-04 DIAGNOSIS — I050 Rheumatic mitral stenosis: Secondary | ICD-10-CM

## 2015-02-04 DIAGNOSIS — I5030 Unspecified diastolic (congestive) heart failure: Secondary | ICD-10-CM | POA: Diagnosis not present

## 2015-02-04 LAB — BASIC METABOLIC PANEL
BUN: 20 mg/dL (ref 6–23)
CO2: 36 meq/L — AB (ref 19–32)
CREATININE: 1.24 mg/dL — AB (ref 0.40–1.20)
Calcium: 8.8 mg/dL (ref 8.4–10.5)
Chloride: 95 mEq/L — ABNORMAL LOW (ref 96–112)
GFR: 43.34 mL/min — AB (ref >60.00)
Glucose, Bld: 139 mg/dL — ABNORMAL HIGH (ref 70–99)
Potassium: 3.3 mEq/L — ABNORMAL LOW (ref 3.5–5.1)
SODIUM: 138 meq/L (ref 135–145)

## 2015-02-04 LAB — TSH: TSH: 8.96 u[IU]/mL — AB (ref 0.35–4.50)

## 2015-02-04 LAB — MAGNESIUM: Magnesium: 1.5 mg/dL (ref 1.5–2.5)

## 2015-02-04 LAB — BRAIN NATRIURETIC PEPTIDE: Pro B Natriuretic peptide (BNP): 2390 pg/mL — ABNORMAL HIGH (ref 0.0–100.0)

## 2015-02-04 MED ORDER — TORSEMIDE 20 MG PO TABS: ORAL_TABLET | ORAL | Status: AC

## 2015-02-04 MED ORDER — TORSEMIDE 20 MG PO TABS: ORAL_TABLET | ORAL | Status: DC

## 2015-02-04 NOTE — Patient Instructions (Addendum)
Medication Instructions:  Increase torsemide to 40mg  two times a day. This will be 2 of your 20mg  tablets two times a day.  Labwork: Lab today--BMET/BNP/Magnesium level/TSH. Your physician recommends that you return for lab work in: 1 week--BMET.    Testing/Procedures: none  Follow-Up: 4 weeks with Dr Mayford Knifeurner.

## 2015-02-05 ENCOUNTER — Telehealth: Payer: Self-pay | Admitting: Nurse Practitioner

## 2015-02-05 NOTE — Telephone Encounter (Signed)
Spoke with patient's daughter, Rosey Batheresa, who is with patient at [facility].  Daughter is aware of lab results and need to add Kdur 10 meq once daily.  She gave me nursing director, Seward MethVondra Humphrey's number [phone] to call to report.   I left a message for Seward MethVondra and advised her that I do not know and daughter does not know where to send Rx.  I advised her to have patient increase potassium-rich foods in patient's diet and to call back to let us know where to send Rx.

## 2015-02-09 ENCOUNTER — Telehealth: Payer: Self-pay | Admitting: Cardiology

## 2015-02-09 NOTE — Telephone Encounter (Signed)
New Message  Pt daughter was calling to make 1 mo F/U. Pt was seen on 4/7 and was told Dr. Mayford Knifeurner had no availability and would be notified for next avail appt. Pt daughter called made next avail (5/18) for 6 weeks out. She wanted to be sure that it was okay to wait 6 weeks and not follow up in 4 weeks- as she was told by Dr. Mayford Knifeurner. Please call back and discuss.

## 2015-02-09 NOTE — Telephone Encounter (Signed)
Rescheduled to 5/13. Pt's daughter agreeable.

## 2015-02-16 ENCOUNTER — Encounter: Payer: Self-pay | Admitting: Cardiology

## 2015-02-19 ENCOUNTER — Encounter: Payer: Self-pay | Admitting: Cardiology

## 2015-02-23 ENCOUNTER — Non-Acute Institutional Stay (SKILLED_NURSING_FACILITY): Payer: Medicare Other | Admitting: Nurse Practitioner

## 2015-02-23 DIAGNOSIS — I482 Chronic atrial fibrillation, unspecified: Secondary | ICD-10-CM

## 2015-02-23 DIAGNOSIS — I10 Essential (primary) hypertension: Secondary | ICD-10-CM

## 2015-02-23 DIAGNOSIS — E034 Atrophy of thyroid (acquired): Secondary | ICD-10-CM

## 2015-02-23 DIAGNOSIS — K5900 Constipation, unspecified: Secondary | ICD-10-CM

## 2015-02-23 DIAGNOSIS — G4733 Obstructive sleep apnea (adult) (pediatric): Secondary | ICD-10-CM

## 2015-02-23 DIAGNOSIS — N184 Chronic kidney disease, stage 4 (severe): Secondary | ICD-10-CM

## 2015-02-23 DIAGNOSIS — I5030 Unspecified diastolic (congestive) heart failure: Secondary | ICD-10-CM

## 2015-02-23 DIAGNOSIS — D638 Anemia in other chronic diseases classified elsewhere: Secondary | ICD-10-CM | POA: Diagnosis not present

## 2015-02-23 DIAGNOSIS — E038 Other specified hypothyroidism: Secondary | ICD-10-CM

## 2015-02-23 NOTE — Progress Notes (Signed)
Patient ID: [name], female   DOB: May 24, 1924, 81 y.o.   MRN: [id number]    Nursing Home Location:  [facility]   Place of Service: SNF (31)  PCP: Margit HanksALEXANDER, ANNE D, MD  Allergies  Allergen Reactions  . Ambien [Zolpidem Tartrate] Other (See Comments)    Pt [place]  . Aspirin Other (See Comments)    Tears my stomach up; "can take coated Aspirin qd without problems"  . Nexium [Esomeprazole Magnesium] Rash  . Penicillins Rash and Other (See Comments)    Tolerates Rocephin    Chief Complaint  Patient presents with  . Medical Management of Chronic Issues    HPI:  Patient is a 81 y.o. female [facility] for routine follow up on chronic conditions. Pt with a pmh of chronic diastolic CHF EF 55-60%, has NYHA class III symptoms, OSA on CPAP, HTN, chronic atrial fibrillation, moderate AS, mild pulmonary HTN, moderate MR and ASD, CKD, anemia, hypothyroidism. Daughter with pt today. Reports [facility] because she is requiring more are than 1 caregiver around the clock at home can provide. This was an issue prior to hospitalization. Pt with ongoing follow up with cardiology due to CHF. Increase in duertics made due to increased edema to LE, this has improved significantly. Blood work being followed here and with cardiologist. TSH noted to be elevated and orders given to increase synthroid.  Pt was noted to have problems with cpap, nursing reports O2 was added and she has been much more compliant.  Review of Systems:  Review of Systems  Constitutional: Negative for fatigue and unexpected weight change.  HENT: Negative for congestion and hearing loss.   Eyes: Negative.   Respiratory: Negative for cough and shortness of breath.        Shortness of breath with increased activity   Cardiovascular: Negative for chest pain, palpitations and leg swelling.  Gastrointestinal: Negative for abdominal pain,  diarrhea and constipation.  Genitourinary: Negative for difficulty urinating.  Musculoskeletal: Negative for myalgias and arthralgias.  Skin: Negative for color change and wound.  Neurological: Negative for dizziness and weakness.  Psychiatric/Behavioral: Positive for confusion (occasionally) and agitation (in the evening). Negative for behavioral problems.    Past Medical History  Diagnosis Date  . Hypertension   . Peripheral neuropathy   . Asthma   . Diabetes with neurologic complications   . PAD (peripheral artery disease)   . Enlarged heart   . Anginal pain 05/28/12  . Pneumonia 11/2010  . Sleep apnea     cpap  . Hypothyroidism   . Headache(784.0) 05/29/12    "recently"  . Stroke 05/2005; 08/2010    !mini"  . Personal history of gout   . Hypothyroidism   . DJD (degenerative joint disease)   . Carpal tunnel syndrome, bilateral   . Recurrent labyrinthitis   . GERD (gastroesophageal reflux disease)     S/P esophageal dilation in 1996  . Gallstone     Silent  . Fibrocystic breast changes   . Hemorrhoids     with bleeding  . Hx of adenomatous colonic polyps     Dr Sherin QuarryWeissman  . Diverticulitis     left side  . MR (mitral regurgitation)     moderate  . Mild anemia     hemoglobin 11.8 on 06/2009  . Edema of lower extremity     Chronic  . Ischemic colitis 8/13  .  Aortic stenosis, moderate   . Chronic diastolic CHF (congestive heart failure)   . Moderate mitral regurgitation   . CKD (chronic kidney disease) stage 3, GFR 30-59 ml/min     "something on labs always say she has some problems"  . Chronic atrial fibrillation   . Peripheral arterial disease     critical limb ischemia, right third toe  . High cholesterol    Past Surgical History  Procedure Laterality Date  . Tonsillectomy and adenoidectomy  1960's  . Dilation and curettage of uterus    . Vaginal hysterectomy  1970's  . Total knee arthroplasty  1970-~2002    left; right  . Cataract extraction w/ intraocular  lens  implant, bilateral  ?1970's  . Kyphoplasty N/A     L2   Social History:   reports that she has never smoked. She has never used smokeless tobacco. She reports that she does not drink alcohol or use illicit drugs.  Family History  Problem Relation Age of Onset  . Heart disease Mother   . Heart disease Father   . Heart disease Brother     Medications: Patient's Medications  New Prescriptions   No medications on file  Previous Medications   ACETAMINOPHEN (TYLENOL) 500 MG TABLET    Take 500 mg by mouth at bedtime. Give with the Tramadol 50 mg   ALLOPURINOL (ZYLOPRIM) 300 MG TABLET    Take 1 tablet (300 mg total) by mouth daily.   AMIODARONE (PACERONE) 200 MG TABLET    Take 1 tablet (200 mg total) by mouth 2 (two) times daily.   APIXABAN (ELIQUIS) 2.5 MG TABS TABLET    Take 1 tablet (2.5 mg total) by mouth 2 (two) times daily.   ATORVASTATIN (LIPITOR) 10 MG TABLET    Take 1 tablet (10 mg total) by mouth daily.   CADEXOMER IODINE (IODOSORB) 0.9 % GEL    Apply 1 application topically daily. For wound dressing on toe. Alternate days used with silver sulfadiazine cream.   CALCIUM CARBONATE-VITAMIN D (CALCIUM-VITAMIN D) 600-200 MG-UNIT CAPS    Take 1 tablet by mouth daily.    GABAPENTIN (NEURONTIN) 300 MG CAPSULE    Take 1 capsule (300 mg total) by mouth at bedtime.   KETOCONAZOLE (NIZORAL) 2 % CREAM       LEVOTHYROXINE (SYNTHROID, LEVOTHROID) 112 MCG TABLET    Take 125 mcg by mouth daily before breakfast.    MORPHINE SULFATE (MORPHINE CONCENTRATE) 10 MG/0.5ML SOLN CONCENTRATED SOLUTION    Take 0.25 mLs (5 mg total) by mouth every 2 (two) hours as needed for shortness of breath.   MULTIPLE VITAMINS-MINERALS (MULTIVITAMIN WITH MINERALS) TABLET    Take 1 tablet by mouth daily.   ONE TOUCH ULTRA TEST TEST STRIP    1 each by Other route See admin instructions. Check blood sugar once daily.   OXYCODONE (OXY IR/ROXICODONE) 5 MG IMMEDIATE RELEASE TABLET    Take 1 tablet (5 mg total) by mouth  every 4 (four) hours as needed for moderate pain.   POTASSIUM CHLORIDE (K-DUR,KLOR-CON) 10 MEQ TABLET    Take 10 mEq by mouth daily.   SILVER SULFADIAZINE (SILVADENE) 1 % CREAM    Apply 1 application topically daily.   TORSEMIDE (DEMADEX) 20 MG TABLET    2 tablets ( ) by mouth two times a day  Modified Medications   No medications on file  Discontinued Medications   No medications on file     Physical Exam: Filed Vitals:   02/23/15 1552  BP: 115/64  Pulse: 60  Temp: 96.9 F (36.1 C)  Resp: 18  Weight: 145 lb (65.772 kg)    Physical Exam  Constitutional: She appears well-developed and well-nourished.  HENT:  Mouth/Throat: Oropharynx is clear and moist. No oropharyngeal exudate.  Eyes: Conjunctivae and EOM are normal. Pupils are equal, round, and reactive to light.  Neck: Normal range of motion. Neck supple.  Cardiovascular: Normal rate.   Murmur heard. Pulmonary/Chest: Effort normal and breath sounds normal.  Abdominal: Soft. Bowel sounds are normal.  Musculoskeletal: She exhibits no edema or tenderness.  Neurological: She is alert. No cranial nerve deficit.  Skin: Skin is warm and dry.  Psychiatric: She has a normal mood and affect.    Labs reviewed: Basic Metabolic Panel:  Recent Labs  16/10/96 0347 01/10/15 0620 02/04/15 1537  NA 132* 131* 138  K 4.0 3.1* 3.3*  CL 95* 93* 95*  CO2 31 27 36*  GLUCOSE 120* 106* 139*  BUN 58* 50* 20  CREATININE 1.66* 1.46* 1.24*  CALCIUM 8.2* 8.5 8.8  MG  --   --  1.5   Liver Function Tests:  Recent Labs  12/26/14 1800 12/27/14 1013  AST 35 33  ALT 22 21  ALKPHOS 110 96  BILITOT 0.9 0.7  PROT 7.2 5.9*  ALBUMIN 3.3* 2.7*   No results for input(s): LIPASE, AMYLASE in the last 8760 hours.  Recent Labs  01/08/15 1141  AMMONIA 24   CBC:  Recent Labs  12/26/14 1800  01/07/15 0240 01/08/15 0437 01/10/15 0620  WBC 11.3*  < > 11.1* 10.5 9.4  NEUTROABS 8.6*  --  8.1*  --   --   HGB 12.7  < > 11.3* 9.9*  10.1*  HCT 38.0  < > 34.7* 30.3* 30.1*  MCV 97.9  < > 95.3 94.1 92.6  PLT 202  < > 191 181 178  < > = values in this interval not displayed. TSH:  Recent Labs  02/04/15 1537  TSH 8.96*   A1C: Lab Results  Component Value Date   HGBA1C 6.2* 02/03/2014   Lipid Panel: No results for input(s): CHOL, HDL, LDLCALC, TRIG, CHOLHDL, LDLDIRECT in the last 8760 hours.   Assessment/Plan 1. Chronic atrial fibrillation -rate controlled, conts on amiodaraone and eliquis   2. Diastolic heart failure, NYHA class 3 conts on O2 and diuretics, no signs of fluid overload, no edema and weight has been stable  3. Essential hypertension Well controlled on current regimen  4. Hypothyroidism due to acquired atrophy of thyroid TSH elevated on last lab, synthroid increased to 125 mcg daily, follow up TSH scheduled  5. OSA (obstructive sleep apnea) Pt with increase agitation related to cpap, this is not new per daughter, pt had some noncompliance at home  -to cont cpap as tolerates  6. CKD (chronic kidney disease) stage 4, GFR 15-29 ml/min Cr stable at this time. Last Cr at 1.06 taken on 02/10/15  7. Anemia of chronic disease hgb has been stable  8. Constipation, unspecified constipation type Worsening constipation, pt is on diuretics due to CHF which is most likely worsening the issue, to start senna s by mouth daily

## 2015-03-03 ENCOUNTER — Non-Acute Institutional Stay (SKILLED_NURSING_FACILITY): Payer: Medicare Other | Admitting: Nurse Practitioner

## 2015-03-03 DIAGNOSIS — I5030 Unspecified diastolic (congestive) heart failure: Secondary | ICD-10-CM

## 2015-03-03 DIAGNOSIS — K5900 Constipation, unspecified: Secondary | ICD-10-CM

## 2015-03-03 NOTE — Progress Notes (Signed)
Patient ID: [name], female   DOB: 12-Mar-1924, 81 y.o.   MRN: [id number]    Nursing [facility]  [facility]   Place of Service: SNF (31)  PCP: Margit HanksALEXANDER, ANNE D, MD  Allergies  Allergen Reactions  . Ambien [Zolpidem Tartrate] Other (See Comments)    Pt does not wake[place] from [place]  . Aspirin Other (See Comments)    Tears my stomach up; "can take coated Aspirin qd without problems"  . Nexium [Esomeprazole Magnesium] Rash  . Penicillins Rash and Other (See Comments)    Tolerates Rocephin    Chief Complaint  Patient presents with  . Acute Visit    HPI:  Patient is a 81 y.o. female [facility] for follow up on edema and weight loss.  Pt with a pmh of chronic diastolic CHF EF 55-60%, has NYHA class III symptoms, OSA on CPAP, HTN, chronic atrial fibrillation, moderate AS, mild pulmonary HTN, moderate MR and ASD, CKD, anemia, hypothyroidism.  Staff noted weight loss over the past week. pts diuretics had previously been increased due to weight gain and LE edema. Currently pt without any edema and reports she is eating and drinking well.  constipation has improved.  Review of Systems:  Review of Systems  Constitutional: Negative for fatigue and unexpected weight change.  HENT: Negative for congestion and hearing loss.   Eyes: Negative.   Respiratory: Negative for cough and shortness of breath.        Shortness of breath with increased activity   Cardiovascular: Negative for chest pain, palpitations and leg swelling.  Gastrointestinal: Negative for abdominal pain, diarrhea and constipation.  Genitourinary: Negative for difficulty urinating.  Musculoskeletal: Negative for myalgias and arthralgias.  Skin: Negative for color change and wound.  Neurological: Negative for dizziness and weakness.    Past Medical History  Diagnosis Date  . Hypertension   . Peripheral neuropathy   . Asthma   . Diabetes with neurologic  complications   . PAD (peripheral artery disease)   . Enlarged heart   . Anginal pain 05/28/12  . Pneumonia 11/2010  . Sleep apnea     cpap  . Hypothyroidism   . Headache(784.0) 05/29/12    "recently"  . Stroke 05/2005; 08/2010    !mini"  . Personal history of gout   . Hypothyroidism   . DJD (degenerative joint disease)   . Carpal tunnel syndrome, bilateral   . Recurrent labyrinthitis   . GERD (gastroesophageal reflux disease)     S/P esophageal dilation in 1996  . Gallstone     Silent  . Fibrocystic breast changes   . Hemorrhoids     with bleeding  . Hx of adenomatous colonic polyps     Dr Sherin QuarryWeissman  . Diverticulitis     left side  . MR (mitral regurgitation)     moderate  . Mild anemia     hemoglobin 11.8 on 06/2009  . Edema of lower extremity     Chronic  . Ischemic colitis 8/13  . Aortic stenosis, moderate   . Chronic diastolic CHF (congestive heart failure)   . Moderate mitral regurgitation   . CKD (chronic kidney disease) stage 3, GFR 30-59 ml/min     "something on labs always say she has some problems"  . Chronic atrial fibrillation   . Peripheral arterial disease     critical limb ischemia, right third toe  . High cholesterol    Past Surgical History  Procedure Laterality Date  . Tonsillectomy and adenoidectomy  1960's  . Dilation and curettage of uterus    . Vaginal hysterectomy  1970's  . Total knee arthroplasty  1970-~2002    left; right  . Cataract extraction w/ intraocular lens  implant, bilateral  ?1970's  . Kyphoplasty N/A     L2   Social History:   reports that she has never smoked. She has never used smokeless tobacco. She reports that she does not drink alcohol or use illicit drugs.  Family History  Problem Relation Age of Onset  . Heart disease Mother   . Heart disease Father   . Heart disease Brother     Medications: Patient's Medications  New Prescriptions   DOXYCYCLINE (VIBRA-TABS) 100 MG TABLET    Take 1 tablet (100 mg total) by  mouth 2 (two) times daily.  Previous Medications   ACETAMINOPHEN (TYLENOL) 500 MG TABLET    Take 500 mg by mouth at bedtime. Give with the Tramadol 50 mg   ALLOPURINOL (ZYLOPRIM) 300 MG TABLET    Take 1 tablet (300 mg total) by mouth daily.   AMIODARONE (PACERONE) 200 MG TABLET    Take 1 tablet (200 mg total) by mouth 2 (two) times daily.   APIXABAN (ELIQUIS) 2.5 MG TABS TABLET    Take 1 tablet (2.5 mg total) by mouth 2 (two) times daily.   ATORVASTATIN (LIPITOR) 10 MG TABLET    Take 1 tablet (10 mg total) by mouth daily.   CADEXOMER IODINE (IODOSORB) 0.9 % GEL    Apply 1 application topically daily. For wound dressing on toe. Alternate days used with silver sulfadiazine cream.   CALCIUM CARBONATE-VITAMIN D (CALCIUM-VITAMIN D) 600-200 MG-UNIT CAPS    Take 1 tablet by mouth daily.    GABAPENTIN (NEURONTIN) 300 MG CAPSULE    Take 1 capsule (300 mg total) by mouth at bedtime.   KETOCONAZOLE (NIZORAL) 2 % CREAM       LEVOTHYROXINE (SYNTHROID, LEVOTHROID) 112 MCG TABLET    Take 125 mcg by mouth daily before breakfast.    MORPHINE SULFATE (MORPHINE CONCENTRATE) 10 MG/0.5ML SOLN CONCENTRATED SOLUTION    Take 0.25 mLs (5 mg total) by mouth every 2 (two) hours as needed for shortness of breath.   MULTIPLE VITAMINS-MINERALS (MULTIVITAMIN WITH MINERALS) TABLET    Take 1 tablet by mouth daily.   ONE TOUCH ULTRA TEST TEST STRIP    1 each by Other route See admin instructions. Check blood sugar once daily.   OXYCODONE (OXY IR/ROXICODONE) 5 MG IMMEDIATE RELEASE TABLET    Take 1 tablet (5 mg total) by mouth every 4 (four) hours as needed for moderate pain.   POTASSIUM CHLORIDE (K-DUR,KLOR-CON) 10 MEQ TABLET    Take 10 mEq by mouth daily.   SILVER SULFADIAZINE (SILVADENE) 1 % CREAM    Apply 1 application topically daily.   TORSEMIDE (DEMADEX) 20 MG TABLET    2 tablets ( ) by mouth two times a day  Modified Medications   No medications on file  Discontinued Medications   No medications on file      Physical Exam: Filed Vitals:   03/03/15 1324  BP: 127/87  Pulse: 74  Temp: 98.6 F (37 C)  Resp: 20    Physical Exam  Constitutional: She appears well-developed and well-nourished.  Cardiovascular: Normal rate.   Murmur heard. Pulmonary/Chest: Effort normal and breath sounds normal.  Abdominal: Soft. Bowel sounds are normal.  Musculoskeletal: She exhibits no edema or tenderness.  Neurological:  She is alert.  Skin: Skin is warm and dry.    Labs reviewed: Basic Metabolic Panel:  Recent Labs  08/65/7802/10/14 0347 01/10/15 0620 02/04/15 1537  NA 132* 131* 138  K 4.0 3.1* 3.3*  CL 95* 93* 95*  CO2 31 27 36*  GLUCOSE 120* 106* 139*  BUN 58* 50* 20  CREATININE 1.66* 1.46* 1.24*  CALCIUM 8.2* 8.5 8.8  MG  --   --  1.5   Liver Function Tests:  Recent Labs  12/26/14 1800 12/27/14 1013  AST 35 33  ALT 22 21  ALKPHOS 110 96  BILITOT 0.9 0.7  PROT 7.2 5.9*  ALBUMIN 3.3* 2.7*   No results for input(s): LIPASE, AMYLASE in the last 8760 hours.  Recent Labs  01/08/15 1141  AMMONIA 24   CBC:  Recent Labs  12/26/14 1800  01/07/15 0240 01/08/15 0437 01/10/15 0620  WBC 11.3*  < > 11.1* 10.5 9.4  NEUTROABS 8.6*  --  8.1*  --   --   HGB 12.7  < > 11.3* 9.9* 10.1*  HCT 38.0  < > 34.7* 30.3* 30.1*  MCV 97.9  < > 95.3 94.1 92.6  PLT 202  < > 191 181 178  < > = values in this interval not displayed. TSH:  Recent Labs  02/04/15 1537  TSH 8.96*   A1C: Lab Results  Component Value Date   HGBA1C 6.2* 02/03/2014   Lipid Panel: No results for input(s): CHOL, HDL, LDLCALC, TRIG, CHOLHDL, LDLDIRECT in the last 8760 hours.   Assessment/Plan  1. Diastolic heart failure, NYHA class 3 conts on O2 and diuretics, no signs of fluid overload, no edema and weight has been stable, pt actually with weight loss due which suspect is due to diuresis because she reports good appetite and that she is eating and drinking well. Will follow up BMP at this time   2.  Constipation, unspecified constipation type Improved on senna, reports BM daily.

## 2015-03-05 ENCOUNTER — Ambulatory Visit (INDEPENDENT_AMBULATORY_CARE_PROVIDER_SITE_OTHER): Payer: Medicare Other | Admitting: Podiatry

## 2015-03-05 ENCOUNTER — Ambulatory Visit: Payer: Medicare Other | Admitting: Podiatrist

## 2015-03-05 ENCOUNTER — Encounter: Payer: Self-pay | Admitting: Podiatry

## 2015-03-05 VITALS — BP 134/56 | HR 64 | Resp 12

## 2015-03-05 DIAGNOSIS — I739 Peripheral vascular disease, unspecified: Secondary | ICD-10-CM | POA: Diagnosis not present

## 2015-03-05 DIAGNOSIS — L089 Local infection of the skin and subcutaneous tissue, unspecified: Secondary | ICD-10-CM

## 2015-03-05 DIAGNOSIS — T148XXA Other injury of unspecified body region, initial encounter: Secondary | ICD-10-CM

## 2015-03-05 DIAGNOSIS — L97514 Non-pressure chronic ulcer of other part of right foot with necrosis of bone: Secondary | ICD-10-CM | POA: Diagnosis not present

## 2015-03-05 DIAGNOSIS — L84 Corns and callosities: Secondary | ICD-10-CM

## 2015-03-05 DIAGNOSIS — T148 Other injury of unspecified body region: Secondary | ICD-10-CM | POA: Diagnosis not present

## 2015-03-05 MED ORDER — DOXYCYCLINE HYCLATE 100 MG PO TABS: 100.0000 mg | ORAL_TABLET | Freq: Two times a day (BID) | ORAL | Status: DC

## 2015-03-07 NOTE — Progress Notes (Signed)
  Subjective: Patient presents today for follow up of ulceration on the tip of the right 3rd toe, lateral right fifth toe and medial first metatarsal head . She states her facility has been applying iodosorb to the ulcerations. She previously had a vascular study and a follow up appointment with Dr. York RamJonathan Barry who recommended no intervention due to her poor vascular supply and inabilty to do any type of intervention on these blocked vessels.  Also, since last appointment her daughter is noticed a heel ulcer/blister form. They have been offloading the heel since. They deny any redness, drainage, or any other signs of infection. No other complaints at this time and no acute changes since last appointment.   Objective: AAO x3, NAD  Neurovascular status unchanged.  Pulses are non palpable  Neurological sensation is decreased. The ulceration on the distal tip of the right third toe continues to be improved. There is no associated redness, drainage, fluctuance, crepitus, malodor.  Ulceration medial right first metatarsal head present measuring 5mm in diameter with fibrogranular base. Is no surrounding erythema, ascending saline disc, fluctuance, crepitus, drainage/purulence, malodor. On the posterior aspect the right heel there is a superficial wound of the heel and on the medial aspect of the heel there is a serous filled bulla. Upon leading to the bulla serous fluid was expressed there is no purulence. There is mild erythema around this lesion without any ascending cellulitis. No malodor. Large callus submet 1 on the left. Upon debridement underlying skin is intact and there is no ulceration, drainage, erythema or any other clinical signs of infection. There is also pre-ulcerative lesion on the left fifth metatarsal base.  Assessment: ulcer down to bone distal tip of right 3rd toe-improving, ulcer lateral fifth toe, ulcer medial right foot, porokeratosis; new right heel bulla with surrounding  erythema  Plan:   -Treatment options were discussed the patient/daughter including alternatives, risks, complications.  -Wounds are very likely debrided without complications. Continue Iodosorb dressing changes daily. -Right heel bulla was lanced. Due to the erythema will restart antibiotics. Prescribed doxycycline.  -Strict offloading to the heels at all times. Recommended Multi-Podus boots.  -Monitor closely for any clinical signs or symptoms of worsening infection and directed to call the office immediately should any occur or go directly to the emergency room.  -Follow-up in 2 weeks or sooner if any problems are to arise. In the meantime call the office with any questions, concerns, change in symptoms.

## 2015-03-11 NOTE — Progress Notes (Signed)
Cardiology Office Note   Date:  03/12/2015   ID:  [name], DOB 06-Oct-1924, MRN [id number]  PCP:  Judith HanksALEXANDER, ANNE D, MD    Chief Complaint  Patient presents with  . Congestive Heart Failure  . Atrial Fibrillation  . Follow-up    hyoertension      History of Present Illness: [name] is a 81 y.o. female with a history of chronic diastolic CHF EF 55-60%, OSA on CPAP, HTN, chronic atrial fibrillation, moderate AS, mild pulmonary HTN, moderate MR and ASD who presents today for followup. She has NYHA class III symptoms. She is doing well today. She denies any chest pain, palpitations or syncope. She has chronic LE edema which is stable. She is now in palliative Care and is [facility] at [facility]. She has chronic LE edema and now has a deep tissue ulcer and is being followed at the [facility].  Since increasing her Lasix her LE edema is significantly improved.  Her SOB is stable and she is on home O2 at 2L [place].  She denies any chest pain.  This past week she has started having problems with lack of appetite and inability to pick up her utensils.      Past Medical History  Diagnosis Date  . Hypertension   . Peripheral neuropathy   . Asthma   . Diabetes with neurologic complications   . PAD (peripheral artery disease)   . Enlarged heart   . Anginal pain 05/28/12  . Pneumonia 11/2010  . Sleep apnea     cpap  . Hypothyroidism   . Headache(784.0) 05/29/12    "recently"  . Stroke 05/2005; 08/2010    !mini"  . Personal history of gout   . Hypothyroidism   . DJD (degenerative joint disease)   . Carpal tunnel syndrome, bilateral   . Recurrent labyrinthitis   . GERD (gastroesophageal reflux disease)     S/P esophageal dilation in 1996  . Gallstone     Silent  . Fibrocystic breast changes   . Hemorrhoids     with bleeding  . Hx of adenomatous colonic polyps     Dr Sherin QuarryWeissman  . Diverticulitis     left side  . MR (mitral regurgitation)     moderate    . Mild anemia     hemoglobin 11.8 on 06/2009  . Edema of lower extremity     Chronic  . Ischemic colitis 8/13  . Aortic stenosis, moderate   . Chronic diastolic CHF (congestive heart failure)   . Moderate mitral regurgitation   . CKD (chronic kidney disease) stage 3, GFR 30-59 ml/min     "something on labs always say she has some problems"  . Chronic atrial fibrillation   . Peripheral arterial disease     critical limb ischemia, right third toe  . High cholesterol     Past Surgical History  Procedure Laterality Date  . Tonsillectomy and adenoidectomy  1960's  . Dilation and curettage of uterus    . Vaginal hysterectomy  1970's  . Total knee arthroplasty  1970-~2002    left; right  . Cataract extraction w/ intraocular lens  implant, bilateral  ?1970's  . Kyphoplasty N/A     L2     Current Outpatient Prescriptions  Medication Sig Dispense Refill  . acetaminophen (TYLENOL) 500 MG tablet Take 500 mg by mouth at bedtime. Give with the Tramadol 50 mg    . allopurinol (ZYLOPRIM) 300 MG tablet Take  1 tablet (300 mg total) by mouth daily. 60 tablet 1  . amiodarone (PACERONE) 200 MG tablet Take 1 tablet (200 mg total) by mouth 2 (two) times daily. 60 tablet 0  . apixaban (ELIQUIS) 2.5 MG TABS tablet Take 1 tablet (2.5 mg total) by mouth 2 (two) times daily. 60 tablet 0  . atorvastatin (LIPITOR) 10 MG tablet Take 1 tablet (10 mg total) by mouth daily. (Patient taking differently: Take 10 mg by mouth daily at 6 PM. ) 30 tablet 1  . cadexomer iodine (IODOSORB) 0.9 % gel Apply 1 application topically daily. For wound dressing on toe. Alternate days used with silver sulfadiazine cream.    . Calcium Carbonate-Vitamin [name] (CALCIUM-VITAMIN [name]) 600-200 MG-UNIT CAPS Take 1 tablet by mouth daily.     Marland Kitchen. doxycycline (VIBRA-TABS) 100 MG tablet Take 1 tablet (100 mg total) by mouth 2 (two) times daily. 20 tablet 0  . gabapentin (NEURONTIN) 300 MG capsule Take 1 capsule (300 mg total) by mouth at bedtime.  30 capsule 1  . ketoconazole (NIZORAL) 2 % cream     . levothyroxine (SYNTHROID, LEVOTHROID) 112 MCG tablet Take 125 mcg by mouth daily before breakfast.     . Morphine Sulfate (MORPHINE CONCENTRATE) 10 MG/0.5ML SOLN concentrated solution Take 0.25 mLs (5 mg total) by mouth every 2 (two) hours as needed for shortness of breath. 42 mL 0  . Multiple Vitamins-Minerals (MULTIVITAMIN WITH MINERALS) tablet Take 1 tablet by mouth daily.    . ONE TOUCH ULTRA TEST test strip 1 each by Other route See admin instructions. Check blood sugar once daily.    Marland Kitchen. oxyCODONE (OXY IR/ROXICODONE) 5 MG immediate release tablet Take 1 tablet (5 mg total) by mouth every 4 (four) hours as needed for moderate pain. 30 tablet 0  . potassium chloride (K-DUR,KLOR-CON) 10 MEQ tablet Take 10 mEq by mouth daily.    . Sennosides (SENNA LAX PO) Take 1 tablet by mouth daily. For constipation    . silver sulfADIAZINE (SILVADENE) 1 % cream Apply 1 application topically daily. (Patient taking differently: Apply 1 application topically every other day. ) 50 g 0  . torsemide (DEMADEX) 20 MG tablet 2 tablets (40mg ) by mouth two times a day 120 tablet 1   No current facility-administered medications for this visit.    Allergies:   Ambien; Aspirin; Nexium; and Penicillins    Social History:  The patient  reports that she has never smoked. She has never used smokeless tobacco. She reports that she does not drink alcohol or use illicit drugs.   Family History:  The patient's family history includes Heart disease in her brother, father, and mother.    ROS:  Please see the history of present illness.   Otherwise, review of systems are positive for none.   All other systems are reviewed and negative.    PHYSICAL EXAM: VS:  BP 120/70 mmHg  Pulse 68  Ht 5\' 6"  (1.676 m)  Wt   SpO2 97% , BMI There is no weight on file to calculate BMI. GEN: Well nourished, well developed, in no acute distress HEENT: normal Neck: no JVD, carotid  bruits, or masses Cardiac: RRR; no murmurs, rubs, or gallops,no edema  Respiratory:  clear to auscultation bilaterally, normal work of breathing GI: soft, nontender, nondistended, + BS MS: no deformity or atrophy Skin: warm and dry, no rash Neuro:  Strength and sensation are intact Psych: euthymic mood, full affect   EKG:  EKG is not ordered today.  Recent Labs: 12/27/2014: ALT 21 01/07/2015: B Natriuretic Peptide 1342.9* 01/10/2015: Hemoglobin 10.1*; Platelets 178 02/04/2015: BUN 20; Creatinine 1.24*; Magnesium 1.5; Potassium 3.3*; Pro B Natriuretic peptide (BNP) 2390.0*; Sodium 138; TSH 8.96*    Lipid Panel    Component Value Date/Time   CHOL  12/11/2010 0600    94        ATP III CLASSIFICATION:  <200     mg/dL   Desirable  409-811  mg/dL   Borderline High  >=914    mg/dL   High          TRIG 47 12/11/2010 0600   HDL 41 12/11/2010 0600   CHOLHDL 2.3 12/11/2010 0600   VLDL 9 12/11/2010 0600   LDLCALC  12/11/2010 0600    44        Total Cholesterol/HDL:CHD Risk Coronary Heart Disease Risk Table                     Men   Women  1/2 Average Risk   3.4   3.3  Average Risk       5.0   4.4  2 X Average Risk   9.6   7.1  3 X Average Risk  23.4   11.0        Use the calculated Patient Ratio above and the CHD Risk Table to determine the patient's CHD Risk.        ATP III CLASSIFICATION (LDL):  <100     mg/dL   Optimal  782-956  mg/dL   Near or Above                    Optimal  130-159  mg/dL   Borderline  213-086  mg/dL   High  >578     mg/dL   Very High      Wt Readings from Last 3 Encounters:  02/23/15 145 lb (65.772 kg)  01/11/15 171 lb 12.6 oz (77.921 kg)  12/11/14 145 lb (65.772 kg)     ASSESSMENT AND PLAN:        1.  Chronic diastolic CHF NYHA class III symptoms.  - continue metoprolol/Lasix  - she is on a fluid restriction < 2L daily and low sodium diet which I stressed again the importance of following. She knows to call if she gains more 3 lbs in  one day for 5lbs in a week.  2. HTN - well controlled - continue metoprolol  3. ASD 4. Chronic atrial fibrillation rate controlled - continue apixaban/metoprolol/amiodarone  5. OSA on CPAP - She has stopped using her CPAP and wants to turn it back in.  I have told her that in light of all her other problems I am ok with her stopping the CPAP.   6. Moderate MR/MS - She is asymptomatic. 7. Mild pulmonary HTN secondary to diastolic dysfunction/MR/AS/OSA 8. PVD with nonhealing ulcer extending up the back of her right heel.  I will refer her back to Dr. Allyson Sabal but not sure that there are any good options.     Current medicines are reviewed at length with the patient today.  The patient does not have concerns regarding medicines.  The following changes have been made:  no change  Labs/ tests ordered today include: see above assessment and plan No orders of the defined types were placed in this encounter.     Disposition:   FU with me in 3 month   Signed, Quintella Reichert, MD  03/12/2015  2:04 PM    [facility] [address] [place], [place], [place]  [address] Phone: [phone]; Fax: [phone]

## 2015-03-12 ENCOUNTER — Encounter: Payer: Self-pay | Admitting: Cardiology

## 2015-03-12 ENCOUNTER — Telehealth: Payer: Self-pay | Admitting: Cardiology

## 2015-03-12 ENCOUNTER — Ambulatory Visit (INDEPENDENT_AMBULATORY_CARE_PROVIDER_SITE_OTHER): Payer: Medicare Other | Admitting: Cardiology

## 2015-03-12 VITALS — BP 120/70 | HR 68 | Ht 66.0 in

## 2015-03-12 DIAGNOSIS — I050 Rheumatic mitral stenosis: Secondary | ICD-10-CM

## 2015-03-12 DIAGNOSIS — I482 Chronic atrial fibrillation, unspecified: Secondary | ICD-10-CM

## 2015-03-12 DIAGNOSIS — I350 Nonrheumatic aortic (valve) stenosis: Secondary | ICD-10-CM

## 2015-03-12 DIAGNOSIS — Q211 Atrial septal defect, unspecified: Secondary | ICD-10-CM

## 2015-03-12 DIAGNOSIS — I359 Nonrheumatic aortic valve disorder, unspecified: Secondary | ICD-10-CM

## 2015-03-12 DIAGNOSIS — I340 Nonrheumatic mitral (valve) insufficiency: Secondary | ICD-10-CM

## 2015-03-12 DIAGNOSIS — I5030 Unspecified diastolic (congestive) heart failure: Secondary | ICD-10-CM | POA: Diagnosis not present

## 2015-03-12 DIAGNOSIS — I10 Essential (primary) hypertension: Secondary | ICD-10-CM | POA: Diagnosis not present

## 2015-03-12 DIAGNOSIS — G4733 Obstructive sleep apnea (adult) (pediatric): Secondary | ICD-10-CM

## 2015-03-12 NOTE — Telephone Encounter (Signed)
Informed pt's daughter [facility] has been notified and given the necessary paperwork. She is thankful for callback.

## 2015-03-12 NOTE — Patient Instructions (Addendum)
Medication Instructions:  Your physician recommends that you continue on your current medications as directed. Please refer to the Current Medication list given to you today.   Labwork: None  Testing/Procedures: None  Follow-Up: Your physician recommends that you schedule a follow-up appointment with Dr. Allyson SabalBerry to follow-up with non-healing foot ulcer.   Your physician recommends that you schedule a follow-up appointment in: 3 months with Dr. Mayford Knifeurner.  Any Other Special Instructions Will Be Listed Below (If Applicable).

## 2015-03-12 NOTE — Telephone Encounter (Signed)
New Message  Pt daughter callinge- needs paperwork from Dr. Mayford Knifeurner saying pt does not need CPAP: [facility] (fax- [phone]); Please call back and dsicuss.

## 2015-03-15 ENCOUNTER — Non-Acute Institutional Stay (SKILLED_NURSING_FACILITY): Payer: Medicare Other | Admitting: Internal Medicine

## 2015-03-15 ENCOUNTER — Encounter: Payer: Self-pay | Admitting: Internal Medicine

## 2015-03-15 DIAGNOSIS — I739 Peripheral vascular disease, unspecified: Secondary | ICD-10-CM | POA: Diagnosis not present

## 2015-03-15 NOTE — Assessment & Plan Note (Signed)
Spoke at length with Chyrl CivatteJoann, wound nurse regarding pt's R foot. Joann dresses foot daily. Concern that foot will be necrotic soon. We spoke last week and increased pt's pain medication to 1-2 norco q 6.Pt has a vascular appointment in 2 days. Pt saw cardilogy several days ago who confirmed that pt's options are very limited.Spoke with pt and her daughter today to get a sense of where they are anticipating even more  difficult decisions coming.

## 2015-03-15 NOTE — Progress Notes (Signed)
MRN: [id number] Name: [name]  Sex: female Age: 81 y.o. DOB: 08/28/1924  PSC #: Sonny Dandy Facility/Room:311 Level Of Care: SNF Provider: Merrilee Seashore D Emergency Contacts: Extended Emergency Contact Information Primary Emergency Contact: Gevena Cotton [place] Mobile Phone: [phone] Relation: Daughter Secondary Emergency Contact: Liliane Shi Address: [address]          [place], [place] [address] [place] Home Phone: [phone] Work Phone: [phone] Mobile Phone: [phone] Relation: Daughter  Code Status: DNR  Allergies: Ambien; Aspirin; Nexium; and Penicillins  Chief Complaint  Patient presents with  . Acute Visit    HPI: Patient is 81 y.o. female who is being seen today to discuss R foot wounds. Wound nurse has spoken with me several times in past 2 days.  Past Medical History  Diagnosis Date  . Hypertension   . Peripheral neuropathy   . Asthma   . Diabetes with neurologic complications   . PAD (peripheral artery disease)   . Enlarged heart   . Anginal pain 05/28/12  . Pneumonia 11/2010  . Sleep apnea     cpap  . Hypothyroidism   . Headache(784.0) 05/29/12    "recently"  . Stroke 05/2005; 08/2010    !mini"  . Personal history of gout   . Hypothyroidism   . DJD (degenerative joint disease)   . Carpal tunnel syndrome, bilateral   . Recurrent labyrinthitis   . GERD (gastroesophageal reflux disease)     S/P esophageal dilation in 1996  . Gallstone     Silent  . Fibrocystic breast changes   . Hemorrhoids     with bleeding  . Hx of adenomatous colonic polyps     Dr Sherin Quarry  . Diverticulitis     left side  . MR (mitral regurgitation)     moderate  . Mild anemia     hemoglobin 11.8 on 06/2009  . Edema of lower extremity     Chronic  . Ischemic colitis 8/13  . Aortic stenosis, moderate   . Chronic diastolic CHF (congestive heart failure)   . Moderate mitral regurgitation   . CKD (chronic  kidney disease) stage 3, GFR 30-59 ml/min     "something on labs always say she has some problems"  . Chronic atrial fibrillation   . Peripheral arterial disease     critical limb ischemia, right third toe  . High cholesterol     Past Surgical History  Procedure Laterality Date  . Tonsillectomy and adenoidectomy  1960's  . Dilation and curettage of uterus    . Vaginal hysterectomy  1970's  . Total knee arthroplasty  1970-~2002    left; right  . Cataract extraction w/ intraocular lens  implant, bilateral  ?1970's  . Kyphoplasty N/A     L2      Medication List       This list is accurate as of: 03/15/15  9:10 PM.  Always use your most recent med list.               acetaminophen 500 MG tablet  Commonly known as:  TYLENOL  Take 500 mg by mouth at bedtime. Give with the Tramadol 50 mg     allopurinol 300 MG tablet  Commonly known as:  ZYLOPRIM  Take 1 tablet (300 mg total) by mouth daily.     amiodarone 200 MG tablet  Commonly known as:  PACERONE  Take 1 tablet (200 mg total) by mouth 2 (two) times daily.  apixaban 2.5 MG Tabs tablet  Commonly known as:  ELIQUIS  Take 1 tablet (2.5 mg total) by mouth 2 (two) times daily.     atorvastatin 10 MG tablet  Commonly known as:  LIPITOR  Take 1 tablet (10 mg total) by mouth daily.     cadexomer iodine 0.9 % gel  Commonly known as:  IODOSORB  Apply 1 application topically daily. For wound dressing on toe. Alternate days used with silver sulfadiazine cream.     Calcium-Vitamin D 600-200 MG-UNIT Caps  Take 1 tablet by mouth daily.     doxycycline 100 MG tablet  Commonly known as:  VIBRA-TABS  Take 1 tablet (100 mg total) by mouth 2 (two) times daily.     gabapentin 300 MG capsule  Commonly known as:  NEURONTIN  Take 1 capsule (300 mg total) by mouth at bedtime.     ketoconazole 2 % cream  Commonly known as:  NIZORAL     levothyroxine 112 MCG tablet  Commonly known as:  SYNTHROID, LEVOTHROID  Take 125 mcg by  mouth daily before breakfast.     morphine CONCENTRATE 10 MG/0.5ML Soln concentrated solution  Take 0.25 mLs (5 mg total) by mouth every 2 (two) hours as needed for shortness of breath.     multivitamin with minerals tablet  Take 1 tablet by mouth daily.     ONE TOUCH ULTRA TEST test strip  Generic drug:  glucose blood  1 each by Other route See admin instructions. Check blood sugar once daily.     oxyCODONE 5 MG immediate release tablet  Commonly known as:  Oxy IR/ROXICODONE  Take 1 tablet (5 mg total) by mouth every 4 (four) hours as needed for moderate pain.     potassium chloride 10 MEQ tablet  Commonly known as:  K-DUR,KLOR-CON  Take 10 mEq by mouth daily.     SENNA LAX PO  Take 1 tablet by mouth daily. For constipation     silver sulfADIAZINE 1 % cream  Commonly known as:  SILVADENE  Apply 1 application topically daily.     torsemide 20 MG tablet  Commonly known as:  DEMADEX  2 tablets (40mg ) by mouth two times a day        No orders of the defined types were placed in this encounter.    Immunization History  Administered Date(s) Administered  . Influenza Split 06/30/2012  . Influenza-Unspecified 07/15/2014    History  Substance Use Topics  . Smoking status: Never Smoker   . Smokeless tobacco: Never Used  . Alcohol Use: No    Review of Systems  DATA OBTAINED: from patient, nurse, family member GENERAL:  no fevers SKIN: No itching, rash HEENT: No complaint RESPIRATORY: No cough, wheezing, SOB CARDIAC: No chest pain, palpitations, lower extremity edema  GI: No abdominal pain, No N/V/D or constipation, No heartburn or reflux  GU: No dysuria, frequency or urgency, or incontinence  MUSCULOSKELETAL: says pain is mostly controlled with new pain meds NEUROLOGIC: No headache, dizziness  PSYCHIATRIC: No overt anxiety or sadness  Filed Vitals:   03/15/15 1416  BP: 113/68  Pulse: 92  Temp: 97 F (36.1 C)  Resp: 18    Physical Exam  GENERAL  APPEARANCE: Alert, conversant, No acute distress  SKIN: foot dressed with sterile dressing so not taken down HEENT: Unremarkable RESPIRATORY: Breathing is even, unlabored   CARDIOVASCULAR: Heart RRR no murmurs, rubs or gallops. trace peripheral edema  GASTROINTESTINAL: Abdomen is not distended  GENITOURINARY:  Bladder not distended  MUSCULOSKELETAL: No abnormal joints or musculature except arthritis NEUROLOGIC: Cranial nerves 2-12 grossly intact. Moves all extremities PSYCHIATRIC: Mood and affect appropriate to situation, no behavioral issues  Patient Active Problem List   Diagnosis Date Noted  . Anemia of chronic disease 01/17/2015  . High cholesterol   . CKD (chronic kidney disease) stage 4, GFR 15-29 ml/min   . Palliative care encounter 01/08/2015  . Urinary retention 01/07/2015  . Hyponatremia with excess extracellular fluid volume 01/07/2015  . Atrial tachycardia 01/07/2015  . Cardiorenal syndrome with renal failure 01/07/2015  . RVF (right ventricular failure) 01/07/2015  . Mitral stenosis 01/07/2015  . Severe tricuspid regurgitation 01/07/2015  . Acute renal failure superimposed on stage 4 chronic kidney disease   . Hypotension 01/06/2015  . Acute renal failure syndrome   . SOB (shortness of breath)   . Acute kidney injury 12/26/2014  . UTI (lower urinary tract infection) 12/26/2014  . Hyperkalemia 12/26/2014  . Diastolic CHF, acute on chronic 12/26/2014  . Elevated troponin I level 12/26/2014  . Peripheral arterial disease 09/09/2014  . DNR (do not resuscitate) 02/12/2014  . Hyponatremia 02/04/2014  . AKI (acute kidney injury) 02/04/2014  . Weakness 02/03/2014  . ASD (atrial septal defect)   . Aortic stenosis, moderate   . Moderate mitral regurgitation   . HTN (hypertension) 10/27/2013  . Gout 10/02/2013  . Abnormality of gait 04/17/2013  . Abnormal PFT 01/28/2013  . Diabetes with neurologic complications   . Physical deconditioning 06/06/2012  . OSA (obstructive  sleep apnea) 06/04/2012  . Chronic atrial fibrillation   . Diastolic heart failure, NYHA class 3   . Hypothyroidism   . Asthma     CBC    Component Value Date/Time   WBC 9.4 01/10/2015 0620   RBC 3.25* 01/10/2015 0620   HGB 10.1* 01/10/2015 0620   HCT 30.1* 01/10/2015 0620   PLT 178 01/10/2015 0620   MCV 92.6 01/10/2015 0620   LYMPHSABS 1.8 01/07/2015 0240   MONOABS 1.1* 01/07/2015 0240   EOSABS 0.1 01/07/2015 0240   BASOSABS 0.0 01/07/2015 0240    CMP     Component Value Date/Time   NA 138 02/04/2015 1537   K 3.3* 02/04/2015 1537   CL 95* 02/04/2015 1537   CO2 36* 02/04/2015 1537   GLUCOSE 139* 02/04/2015 1537   BUN 20 02/04/2015 1537   CREATININE 1.24* 02/04/2015 1537   CALCIUM 8.8 02/04/2015 1537   PROT 5.9* 12/27/2014 1013   ALBUMIN 2.7* 12/27/2014 1013   AST 33 12/27/2014 1013   ALT 21 12/27/2014 1013   ALKPHOS 96 12/27/2014 1013   BILITOT 0.7 12/27/2014 1013   GFRNONAA 31* 01/10/2015 0620   GFRAA 36* 01/10/2015 0620    Assessment and Plan  Peripheral arterial disease Spoke at length with Joann, wound nurse regarding pt's R foot. Joann dresses foot daily. Concern that foot will be necrotic soon. We spoke last week and increased pt's pain medication to 1-2 norco q 6.Pt has a vascular appointment in 2 days. Pt saw cardilogy several days ago who confirmed that pt's options are very limited.Spoke with pt and her daughter today to get a sense of where they are anticipating even more  difficult decisions coming.    Time spent > 25 Margit HanksALEXANDER, ANNE D, MD

## 2015-03-16 ENCOUNTER — Ambulatory Visit (INDEPENDENT_AMBULATORY_CARE_PROVIDER_SITE_OTHER): Payer: Medicare Other | Admitting: Cardiovascular Disease

## 2015-03-16 ENCOUNTER — Encounter: Payer: Self-pay | Admitting: Cardiovascular Disease

## 2015-03-16 VITALS — BP 104/60 | HR 64 | Ht 67.0 in | Wt 141.0 lb

## 2015-03-16 DIAGNOSIS — I739 Peripheral vascular disease, unspecified: Secondary | ICD-10-CM

## 2015-03-16 NOTE — Progress Notes (Signed)
03/16/2015 Judith BruinsClarice V [name]   01-22-1924  [id number]  Primary Physician Margit HanksALEXANDER, ANNE D, MD Primary Cardiologist: Runell GessJonathan J. Berry MD Roseanne RenoFACP,FACC,FAHA, FSCAI   HPI:  : [name] is an 81 year old frail-appearing Caucasian female who is accompanied by her daughter today. She was referred by Dr. Irving ShowsEgerton , podiatrist, for peripheral vascular evaluation prior to elective right third toe amputation. I initially saw her in the office 09/09/14. I last saw her in the office 12/11/14. Her past history is notable for diastolic dysfunction with normal EF, moderate 8 aortic stenosis, moderate mitral regurgitation as well as chronic atrial fibrillation on oral anti-coagulation. Her other problems include a history of hypertension, hyperlipidemia, diabetes and sleep apnea. She is a DO NOT RESUSCITATE. She is primarily wheelchair-bound. She has a right third toe ulcer with exposed bone and has been treated aggressively with local therapy for over 6 months. Lower extremity artery Doppler studies performed in our office 09/03/14 revealed a right brachial index of 0.2 suggesting that she did not have enough perfusion to heal an amputation wound. She had a high-grade right common femoral artery stenosis with an occluded right popliteal artery and tibial vessels as well. She has been treated by Dr. Irving ShowsEgerton aggressively with local care. Since I saw her 3 months ago she's had progression in her right lower extremity with progressive areas of gangrene and cellulitis. She has been treated with oral antibiotics by her podiatrist, Dr. Ardelle AntonWagoner. I do not feel she is a candidate for amputation nor do I think it would be appropriate at this point given her age and comorbidities. She will admit rest of interventional approach. Her daughter agrees with this.   Current Outpatient Prescriptions  Medication Sig Dispense Refill  . acetaminophen (TYLENOL) 500 MG tablet Take 500 mg by mouth at bedtime. Give with the Tramadol 50  mg    . allopurinol (ZYLOPRIM) 300 MG tablet Take 1 tablet (300 mg total) by mouth daily. 60 tablet 1  . amiodarone (PACERONE) 200 MG tablet Take 1 tablet (200 mg total) by mouth 2 (two) times daily. 60 tablet 0  . apixaban (ELIQUIS) 2.5 MG TABS tablet Take 1 tablet (2.5 mg total) by mouth 2 (two) times daily. 60 tablet 0  . atorvastatin (LIPITOR) 10 MG tablet Take 1 tablet (10 mg total) by mouth daily. (Patient taking differently: Take 10 mg by mouth daily at 6 PM. ) 30 tablet 1  . Calcium Carbonate-Vitamin D (CALCIUM-VITAMIN D) 600-200 MG-UNIT CAPS Take 1 tablet by mouth daily.     Marland Kitchen. gabapentin (NEURONTIN) 300 MG capsule Take 1 capsule (300 mg total) by mouth at bedtime. 30 capsule 1  . levothyroxine (SYNTHROID, LEVOTHROID) 112 MCG tablet Take 125 mcg by mouth daily before breakfast.     . magnesium hydroxide (MILK OF MAGNESIA) 400 MG/5ML suspension Take by mouth as needed for mild constipation.    . Morphine Sulfate (MORPHINE CONCENTRATE) 10 MG/0.5ML SOLN concentrated solution Take 0.25 mLs (5 mg total) by mouth every 2 (two) hours as needed for shortness of breath. 42 mL 0  . Multiple Vitamins-Minerals (MULTIVITAMIN WITH MINERALS) tablet Take 1 tablet by mouth daily.    Marland Kitchen. oxyCODONE (OXY IR/ROXICODONE) 5 MG immediate release tablet Take 1 tablet (5 mg total) by mouth every 4 (four) hours as needed for moderate pain. 30 tablet 0  . potassium chloride (K-DUR,KLOR-CON) 10 MEQ tablet Take 10 mEq by mouth daily.    . Sennosides (SENNA LAX PO) Take 1 tablet by mouth daily.  For constipation    . torsemide (DEMADEX) 20 MG tablet 2 tablets (40mg ) by mouth two times a day 120 tablet 1   No current facility-administered medications for this visit.    Allergies  Allergen Reactions  . Ambien [Zolpidem Tartrate] Other (See Comments)    Pt does not [place] from [place]  . Aspirin Other (See Comments)    Tears my stomach up; "can take coated Aspirin qd without problems"  . Nexium [Esomeprazole Magnesium]  Rash  . Penicillins Rash and Other (See Comments)    Tolerates Rocephin    History   Social History  . Marital Status: Widowed    Spouse Name: N/A  . Number of Children: 3  . Years of Education: 8th   Occupational History  . RETIRED     [employer]   Social History Main Topics  . Smoking status: Never Smoker   . Smokeless tobacco: Never Used  . Alcohol Use: No  . Drug Use: No  . Sexual Activity: No   Other Topics Concern  . Not on file   Social History Narrative   Retired. Lives alone. 3 adult children. No tobacco use. Nondrinker. No illicit drug use.    Caffeine Use: none     Review of Systems: General: negative for chills, fever, night sweats or weight changes.  Cardiovascular: negative for chest pain, dyspnea on exertion, edema, orthopnea, palpitations, paroxysmal nocturnal dyspnea or shortness of breath Dermatological: negative for rash Respiratory: negative for cough or wheezing Urologic: negative for hematuria Abdominal: negative for nausea, vomiting, diarrhea, bright red blood per rectum, melena, or hematemesis Neurologic: negative for visual changes, syncope, or dizziness All other systems reviewed and are otherwise negative except as noted above.    Blood pressure 104/60, pulse 64, height 5\' 7"  (1.702 m), weight 141 lb (63.957 kg).  General appearance: alert and mild distress Neck: no adenopathy, no carotid bruit, no JVD, supple, symmetrical, trachea midline and thyroid not enlarged, symmetric, no tenderness/mass/nodules Lungs: clear to auscultation bilaterally Heart: 2/6 systolic ejection murmur at the base consistent with aortic stenosis Extremities: ggross discoloration of the right foot with ischemic ulcers lateral aspect of the fifth toe, medial aspect of the first toe, heel and lateral aspect of the lower leg.  EKG not performed today  ASSESSMENT AND PLAN:   Peripheral arterial disease [name] was referred to me for critical limb ischemia. I  last saw her in the office 12/11/14. She has progressive changes consistent with CLIA and gangrene in her right foot and multiple vascular territories. Her last Doppler study performed 09/03/14 revealed a right TBI of 0.2 with high-grade disease in the right common femoral artery and occluded popliteal and tibial vessels. Changes in her foot up her baby and progressive. She is in significant amount of discomfort. She was recently seen by her podiatrist, Dr. Ardelle AntonWagoner, who placed her on oral antibiotics. I had along discussion with the patient and her daughter. She is a DO NOT RESUSCITATE. I do not think that she would survive amputation. I do not think she has a candidate for an  aggressive interventional approach. We talked about pursuing palliative care. I will see her back when necessary       Runell GessJonathan J. Berry MD [facility] ChaseFACP,FACC,FAHA, [facility] CenterFSCAI 03/16/2015 5:02 PM

## 2015-03-16 NOTE — Assessment & Plan Note (Signed)
[name] was referred to me for critical limb ischemia. I last saw her in the office 12/11/14. She has progressive changes consistent with CLIA and gangrene in her right foot and multiple vascular territories. Her last Doppler study performed 09/03/14 revealed a right TBI of 0.2 with high-grade disease in the right common femoral artery and occluded popliteal and tibial vessels. Changes in her foot up her baby and progressive. She is in significant amount of discomfort. She was recently seen by her podiatrist, Dr. Ardelle AntonWagoner, who placed her on oral antibiotics. I had along discussion with the patient and her daughter. She is a DO NOT RESUSCITATE. I do not think that she would survive amputation. I do not think she has a candidate for an  aggressive interventional approach. We talked about pursuing palliative care. I will see her back when necessary

## 2015-03-16 NOTE — Patient Instructions (Signed)
Follow up with Dr Berry as needed.  

## 2015-03-17 ENCOUNTER — Ambulatory Visit: Payer: Medicare Other | Admitting: Cardiology

## 2015-03-19 ENCOUNTER — Ambulatory Visit: Payer: Medicare Other | Admitting: Podiatry

## 2015-03-22 IMAGING — CR DG CHEST 2V
2 series · 2 of 2 positions shown · non-contrast
Comparison: 06/03/2012

CLINICAL DATA: Short of breath.  Amiodorone

CHEST - 2 VIEW

[view not recorded (1 of 2)]
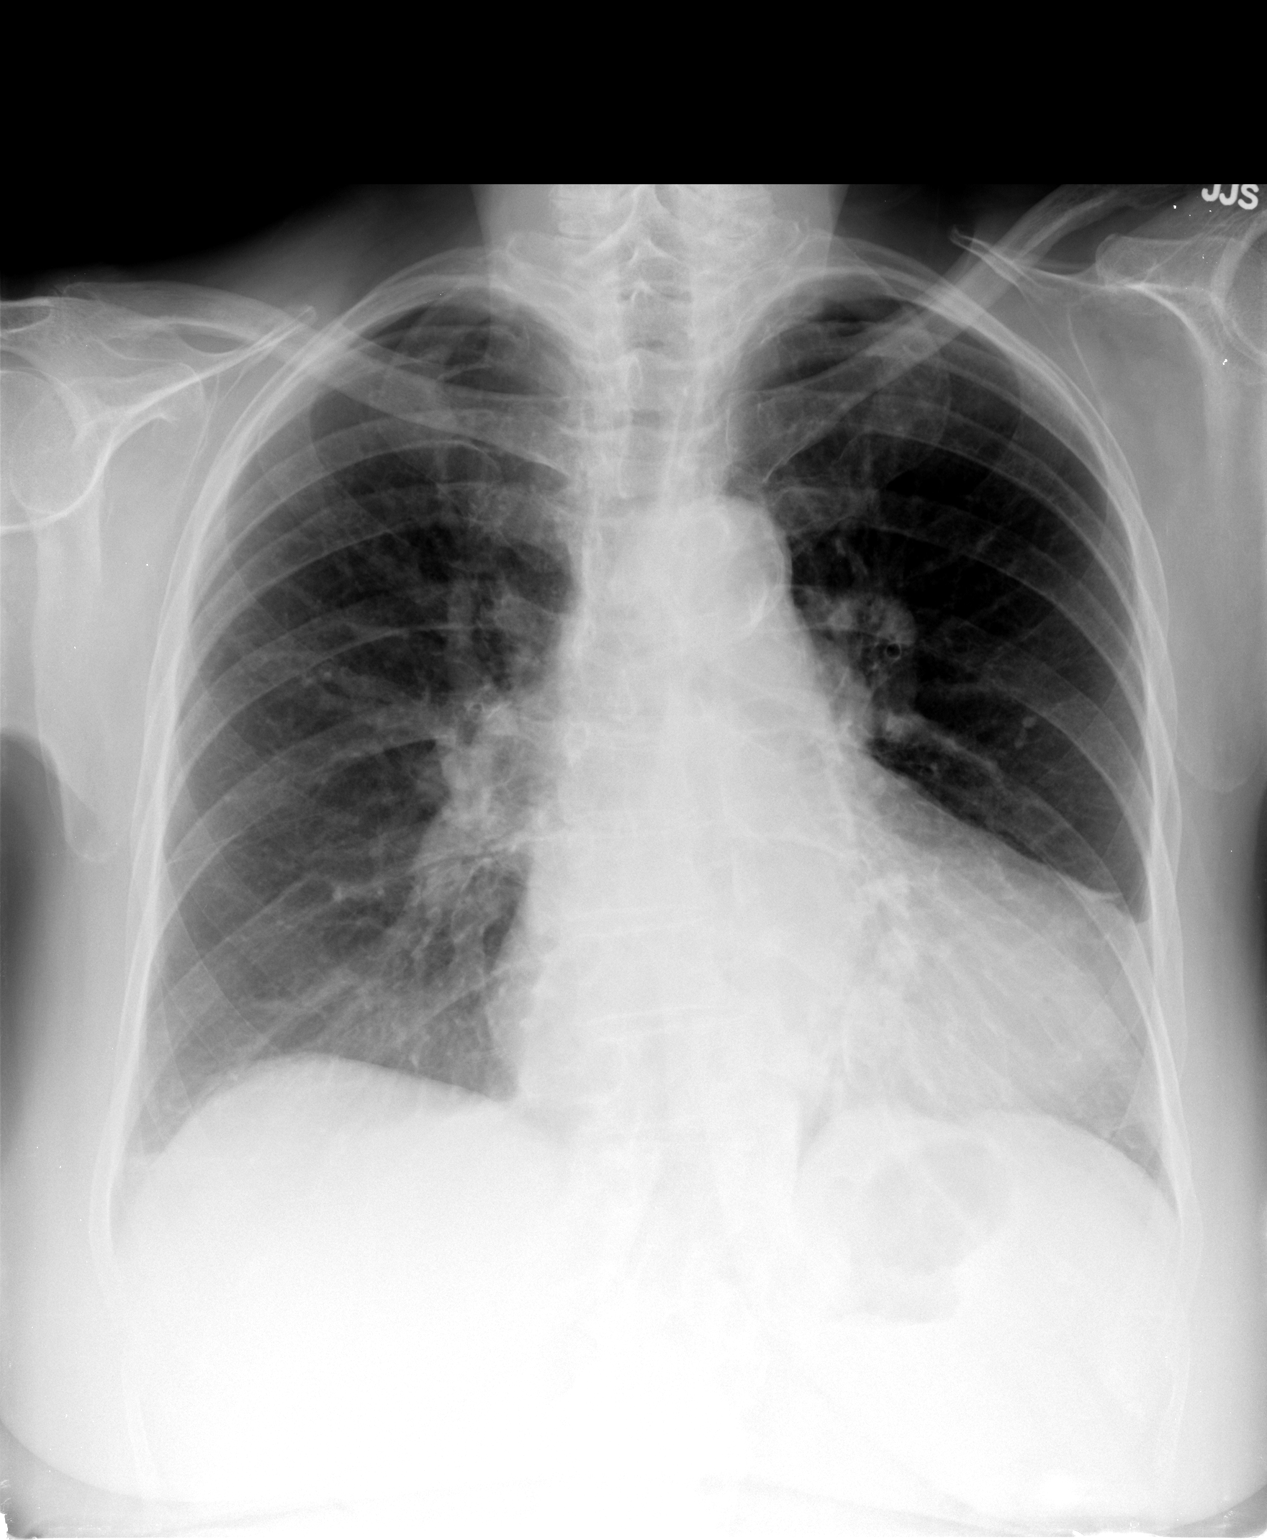

[view not recorded (2 of 2)]
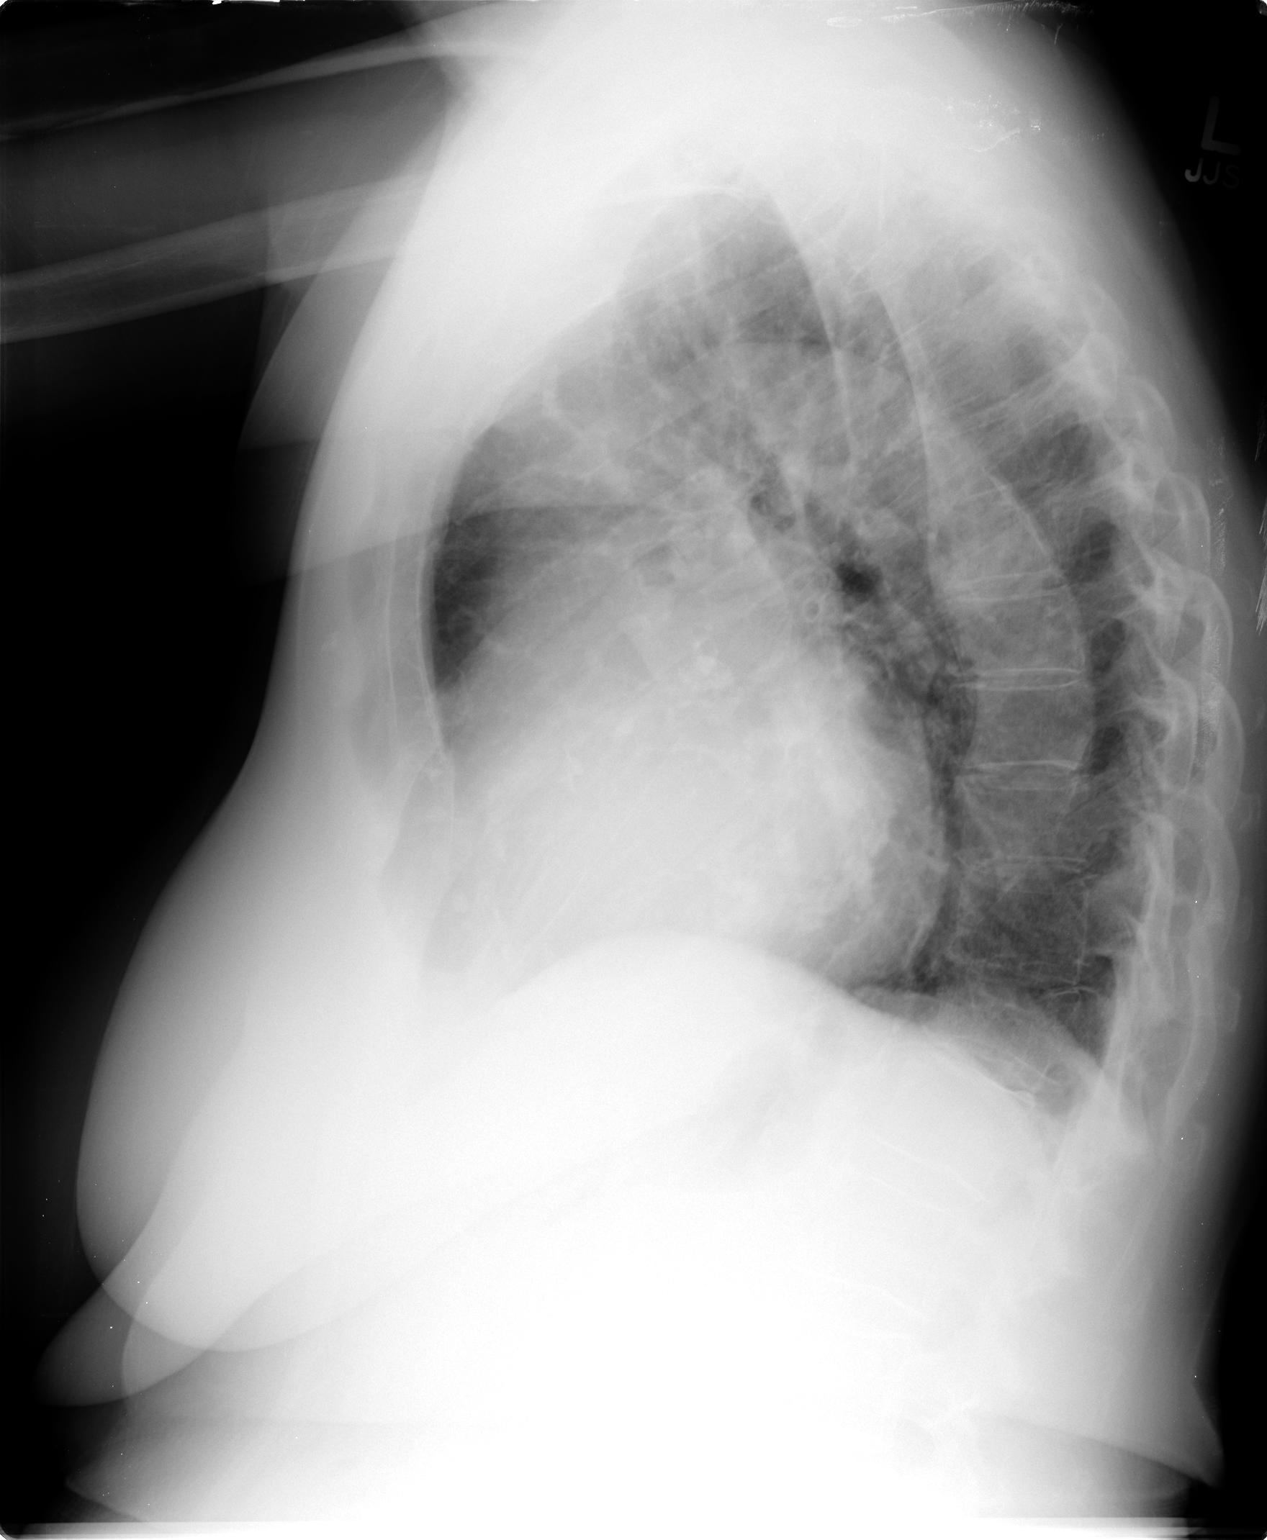

[2 of 2 positions shown; findings below may reference images not displayed]

FINDINGS: Cardiac enlargement without heart failure.  Lungs are
clear without infiltrate or effusion.  No significant fibrosis.  No
mass lesion.

Compression fracture of approximately T11 which is not present on
the CT of 05/31/2012.
IMPRESSION: Cardiac enlargement.  No acute abnormality.

Compression fracture approximately T11

## 2015-03-26 ENCOUNTER — Encounter: Payer: Self-pay | Admitting: Cardiology

## 2015-04-30 DEATH — deceased

## 2015-05-19 ENCOUNTER — Ambulatory Visit: Payer: Medicare Other | Admitting: Neurology

## 2015-06-07 ENCOUNTER — Ambulatory Visit: Payer: Medicare Other | Admitting: Cardiology

## 2016-03-05 IMAGING — CR DG CHEST 1V PORT
1 series · 1 of 1 positions shown · non-contrast
Comparison: January 05, 2014

CLINICAL DATA: Shortness of Breath

EXAM:
PORTABLE CHEST - 1 VIEW

[AP]
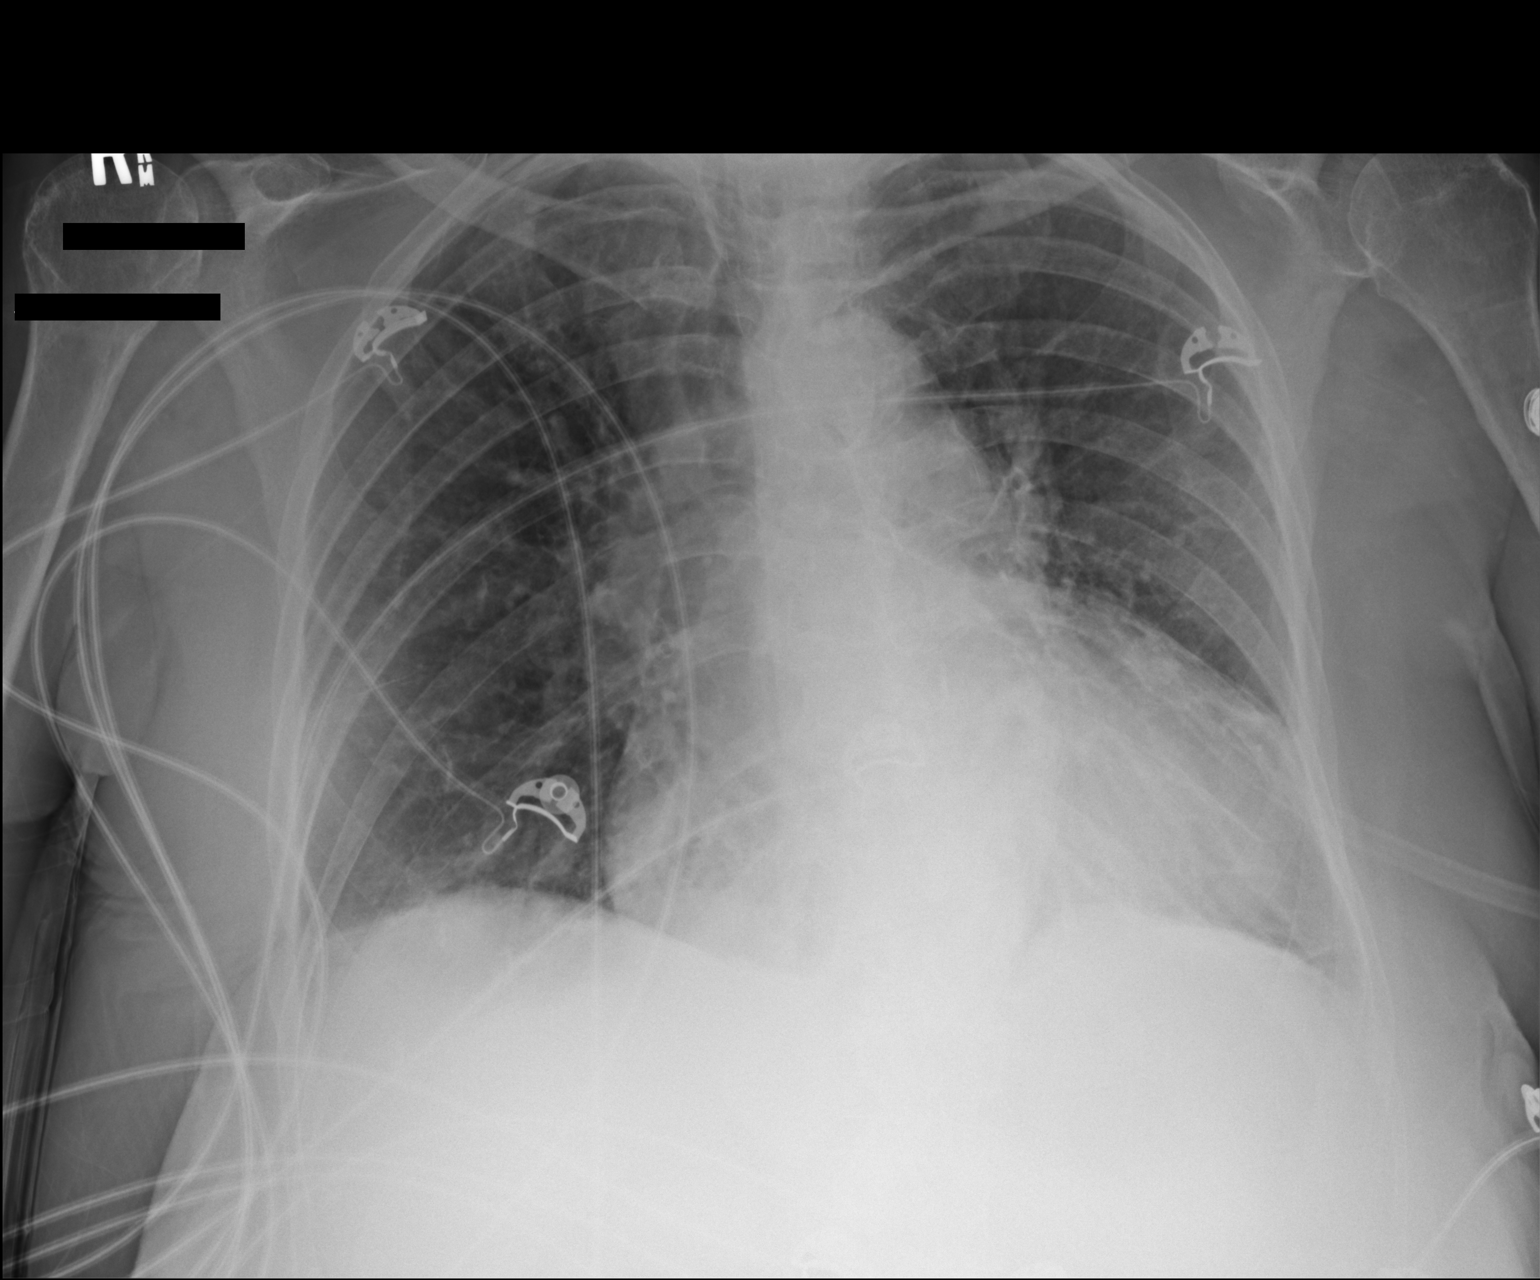

[1 of 1 positions shown; findings below may reference images not displayed]

FINDINGS: There is no edema or consolidation. Heart is enlarged with mild
pulmonary venous hypertension. No adenopathy. No bone lesions.
IMPRESSION: Evidence of volume overload with cardiomegaly and pulmonary venous
hypertension. No overt edema or consolidation, however. No
appreciable effusions.

## 2016-04-04 IMAGING — CR DG HIP COMPLETE 2+V*R*
3 series · 3 of 3 positions shown · non-contrast
Comparison: None.

CLINICAL DATA: FALL

EXAM:
RIGHT HIP - COMPLETE 2+ VIEW

[t pelvis ap]
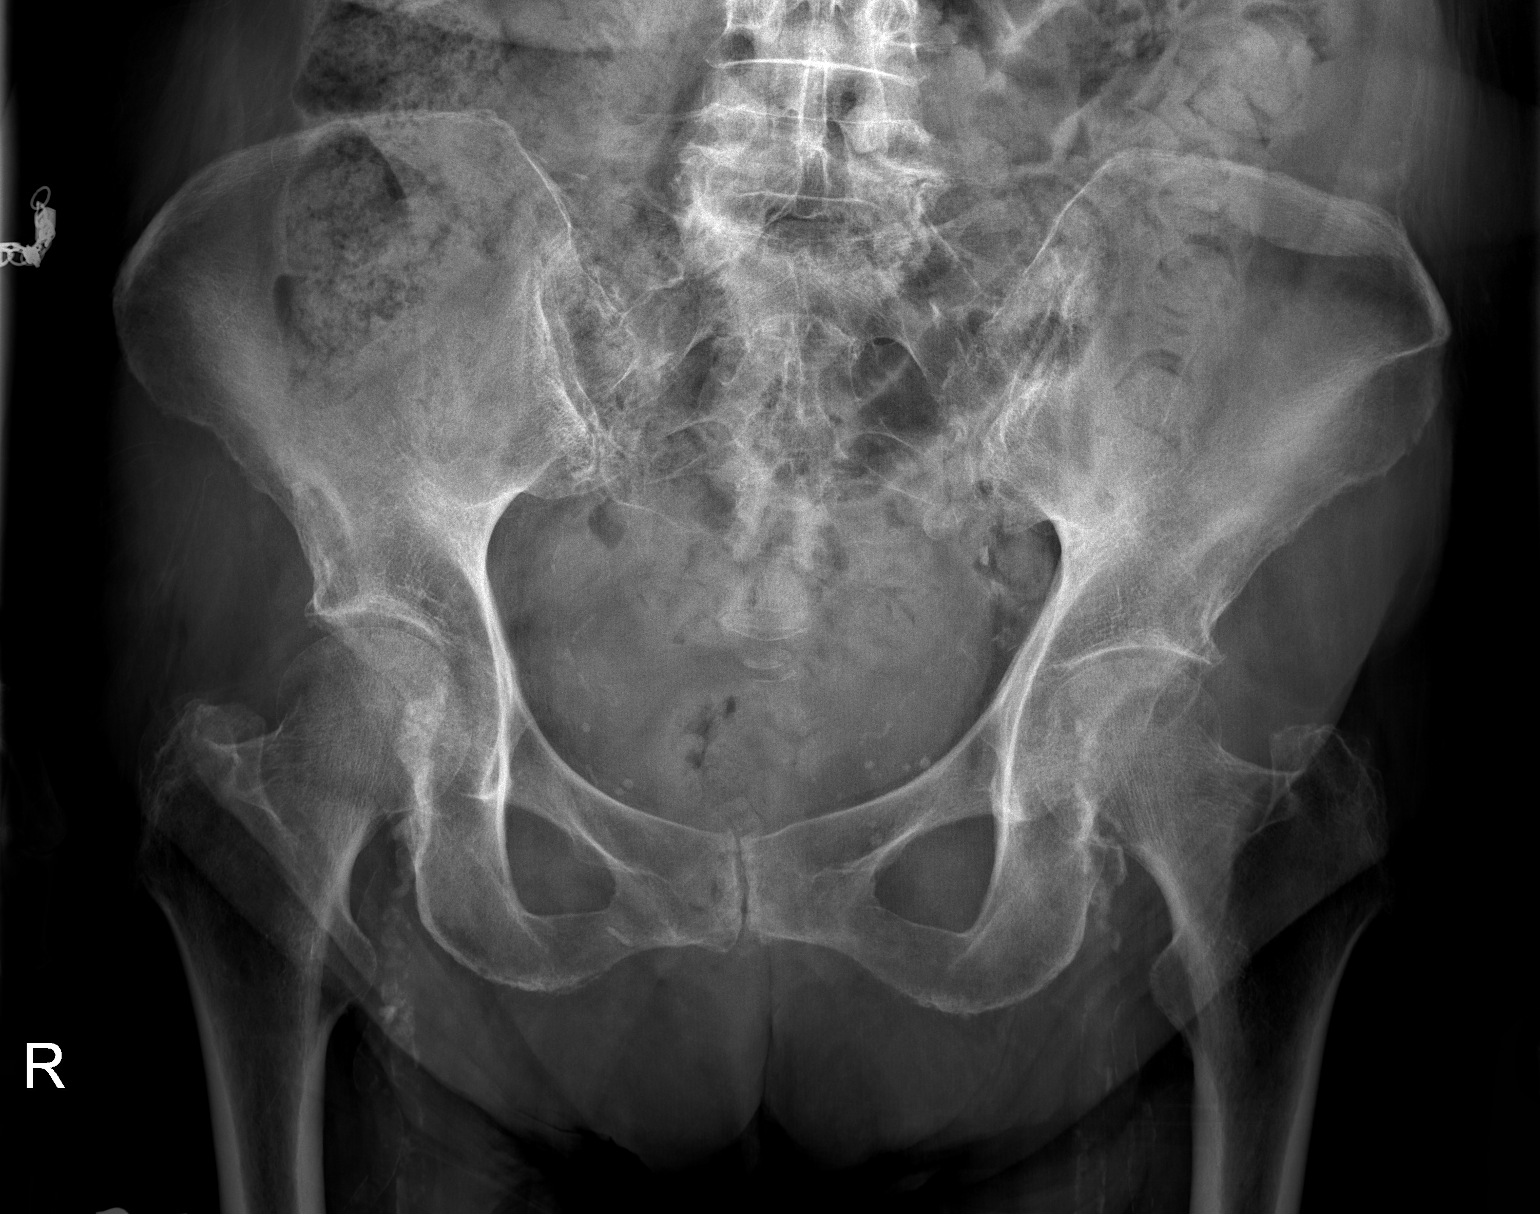

[t hip ap right]
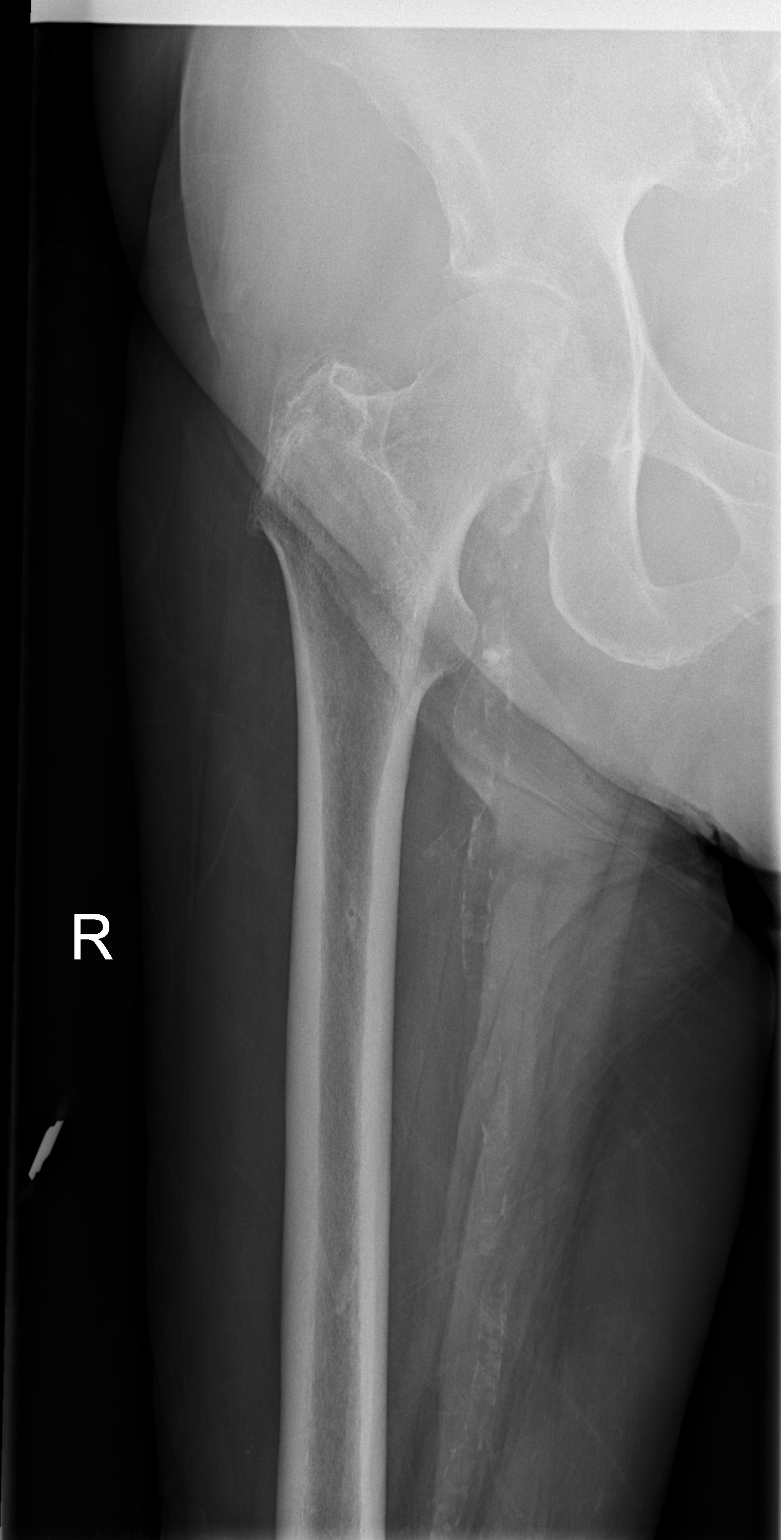

[t hip frog leg right]
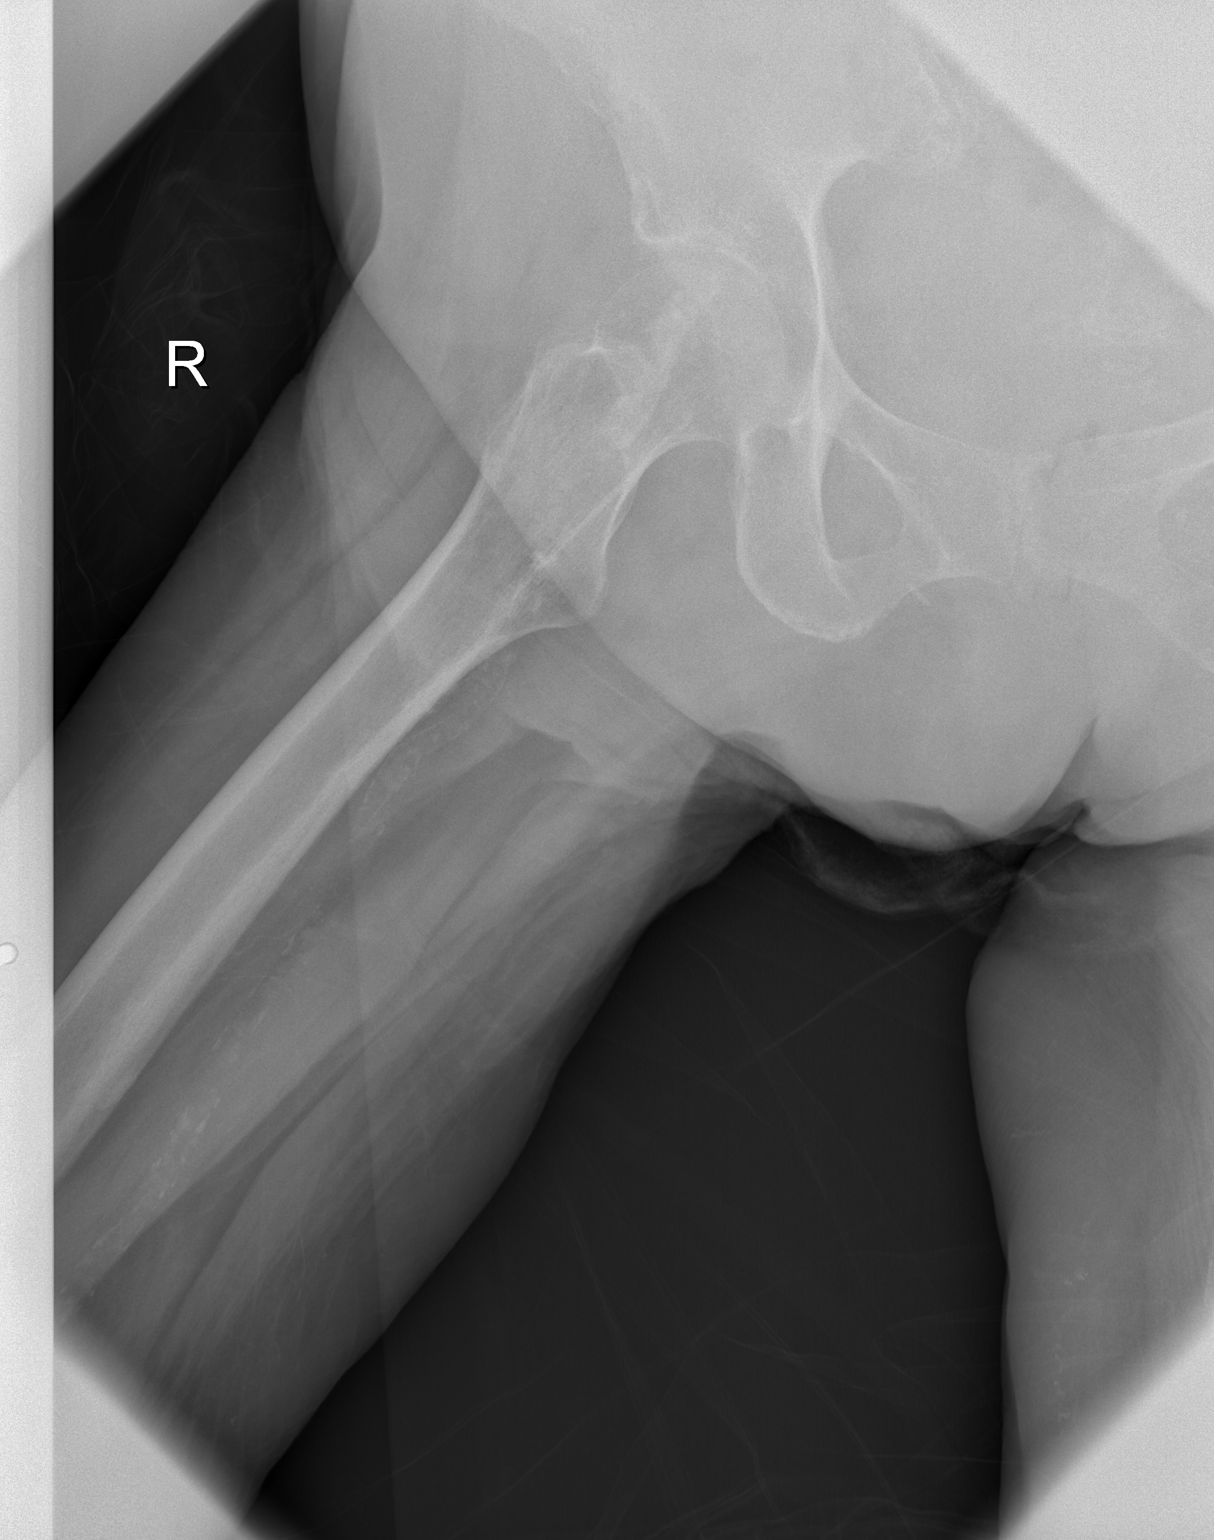

[3 of 3 positions shown; findings below may reference images not displayed]

FINDINGS: There is no evidence of hip fracture or dislocation. Areas of
hypertrophic spurring involving acetabulum bilaterally.
Periarticular sclerosis and hypertrophic spurring within the
sacroiliac joints. Degenerative changes appreciated within the lower
lumbar spine, and pubic symphysis. Atherosclerotic calcifications
within the external iliac and femoral vessels.
IMPRESSION: Degenerative changes without acute osseous abnormalities.
